# Patient Record
Sex: Male | Born: 1945
Health system: Southern US, Community
[De-identification: ages and names within clinical notes are randomized; demographics above are authoritative.]

## PROBLEM LIST (undated history)

## (undated) DIAGNOSIS — K760 Fatty (change of) liver, not elsewhere classified: Secondary | ICD-10-CM

## (undated) DIAGNOSIS — F419 Anxiety disorder, unspecified: Secondary | ICD-10-CM

## (undated) DIAGNOSIS — Z8673 Personal history of transient ischemic attack (TIA), and cerebral infarction without residual deficits: Secondary | ICD-10-CM

## (undated) DIAGNOSIS — I351 Nonrheumatic aortic (valve) insufficiency: Secondary | ICD-10-CM

## (undated) DIAGNOSIS — B3781 Candidal esophagitis: Secondary | ICD-10-CM

## (undated) DIAGNOSIS — K641 Second degree hemorrhoids: Secondary | ICD-10-CM

## (undated) DIAGNOSIS — Z860101 Personal history of adenomatous and serrated colon polyps: Secondary | ICD-10-CM

## (undated) DIAGNOSIS — G2581 Restless legs syndrome: Secondary | ICD-10-CM

## (undated) DIAGNOSIS — G4730 Sleep apnea, unspecified: Secondary | ICD-10-CM

## (undated) DIAGNOSIS — Z8719 Personal history of other diseases of the digestive system: Secondary | ICD-10-CM

## (undated) DIAGNOSIS — N529 Male erectile dysfunction, unspecified: Secondary | ICD-10-CM

## (undated) DIAGNOSIS — Z8551 Personal history of malignant neoplasm of bladder: Secondary | ICD-10-CM

## (undated) DIAGNOSIS — Z86010 Personal history of colonic polyps: Secondary | ICD-10-CM

## (undated) DIAGNOSIS — Z973 Presence of spectacles and contact lenses: Secondary | ICD-10-CM

## (undated) DIAGNOSIS — F32A Depression, unspecified: Secondary | ICD-10-CM

## (undated) DIAGNOSIS — Z8546 Personal history of malignant neoplasm of prostate: Secondary | ICD-10-CM

## (undated) DIAGNOSIS — R760 Raised antibody titer: Secondary | ICD-10-CM

## (undated) DIAGNOSIS — K449 Diaphragmatic hernia without obstruction or gangrene: Secondary | ICD-10-CM

## (undated) DIAGNOSIS — Z9989 Dependence on other enabling machines and devices: Secondary | ICD-10-CM

## (undated) DIAGNOSIS — I493 Ventricular premature depolarization: Secondary | ICD-10-CM

## (undated) DIAGNOSIS — K601 Chronic anal fissure: Secondary | ICD-10-CM

## (undated) DIAGNOSIS — R011 Cardiac murmur, unspecified: Secondary | ICD-10-CM

## (undated) DIAGNOSIS — Z86718 Personal history of other venous thrombosis and embolism: Secondary | ICD-10-CM

## (undated) DIAGNOSIS — D509 Iron deficiency anemia, unspecified: Secondary | ICD-10-CM

## (undated) DIAGNOSIS — C679 Malignant neoplasm of bladder, unspecified: Secondary | ICD-10-CM

## (undated) DIAGNOSIS — Z86711 Personal history of pulmonary embolism: Secondary | ICD-10-CM

## (undated) DIAGNOSIS — M1990 Unspecified osteoarthritis, unspecified site: Secondary | ICD-10-CM

## (undated) DIAGNOSIS — I10 Essential (primary) hypertension: Secondary | ICD-10-CM

## (undated) DIAGNOSIS — C801 Malignant (primary) neoplasm, unspecified: Secondary | ICD-10-CM

## (undated) DIAGNOSIS — K219 Gastro-esophageal reflux disease without esophagitis: Secondary | ICD-10-CM

## (undated) DIAGNOSIS — G4733 Obstructive sleep apnea (adult) (pediatric): Secondary | ICD-10-CM

## (undated) DIAGNOSIS — F329 Major depressive disorder, single episode, unspecified: Secondary | ICD-10-CM

## (undated) DIAGNOSIS — Z8739 Personal history of other diseases of the musculoskeletal system and connective tissue: Secondary | ICD-10-CM

## (undated) DIAGNOSIS — E785 Hyperlipidemia, unspecified: Secondary | ICD-10-CM

## (undated) DIAGNOSIS — D6861 Antiphospholipid syndrome: Secondary | ICD-10-CM

## (undated) HISTORY — PX: STRABISMUS SURGERY: SHX218

## (undated) HISTORY — DX: Antiphospholipid syndrome: D68.61

## (undated) HISTORY — DX: Iron deficiency anemia, unspecified: D50.9

## (undated) HISTORY — DX: Fatty (change of) liver, not elsewhere classified: K76.0

## (undated) HISTORY — PX: COLONOSCOPY: SHX174

## (undated) HISTORY — PX: CARDIOVASCULAR STRESS TEST: SHX262

## (undated) HISTORY — DX: Sleep apnea, unspecified: G47.30

## (undated) HISTORY — PX: TONSILLECTOMY: SHX5217

## (undated) HISTORY — PX: UPPER GASTROINTESTINAL ENDOSCOPY: SHX188

## (undated) HISTORY — DX: Restless legs syndrome: G25.81

## (undated) HISTORY — DX: Male erectile dysfunction, unspecified: N52.9

## (undated) HISTORY — PX: APPENDECTOMY: SHX54

## (undated) HISTORY — DX: Diaphragmatic hernia without obstruction or gangrene: K44.9

## (undated) HISTORY — PX: TRANSTHORACIC ECHOCARDIOGRAM: SHX275

## (undated) HISTORY — DX: Major depressive disorder, single episode, unspecified: F32.9

## (undated) HISTORY — DX: Essential (primary) hypertension: I10

## (undated) HISTORY — DX: Personal history of other diseases of the musculoskeletal system and connective tissue: Z87.39

## (undated) HISTORY — DX: Raised antibody titer: R76.0

## (undated) HISTORY — DX: Depression, unspecified: F32.A

## (undated) HISTORY — DX: Hyperlipidemia, unspecified: E78.5

## (undated) HISTORY — DX: Anxiety disorder, unspecified: F41.9

## (undated) HISTORY — DX: Chronic anal fissure: K60.1

## (undated) HISTORY — DX: Gastro-esophageal reflux disease without esophagitis: K21.9

## (undated) HISTORY — PX: ESOPHAGOGASTRODUODENOSCOPY: SHX1529

---
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
## 2003-01-22 ENCOUNTER — Ambulatory Visit (HOSPITAL_COMMUNITY): Admission: RE | Admit: 2003-01-22 | Discharge: 2003-01-22 | Payer: Self-pay | Admitting: Gastroenterology

## 2003-01-22 ENCOUNTER — Encounter: Payer: Self-pay | Admitting: Gastroenterology

## 2003-05-06 ENCOUNTER — Encounter: Payer: Self-pay | Admitting: Emergency Medicine

## 2003-05-06 ENCOUNTER — Emergency Department (HOSPITAL_COMMUNITY): Admission: EM | Admit: 2003-05-06 | Discharge: 2003-05-06 | Payer: Self-pay | Admitting: Emergency Medicine

## 2004-04-09 ENCOUNTER — Encounter: Admission: RE | Admit: 2004-04-09 | Discharge: 2004-07-08 | Payer: Self-pay | Admitting: Pediatrics

## 2004-07-09 ENCOUNTER — Encounter: Admission: RE | Admit: 2004-07-09 | Discharge: 2004-10-07 | Payer: Self-pay | Admitting: Pediatrics

## 2004-07-23 ENCOUNTER — Ambulatory Visit: Payer: Self-pay | Admitting: Gastroenterology

## 2004-07-29 ENCOUNTER — Ambulatory Visit: Payer: Self-pay | Admitting: Gastroenterology

## 2004-09-10 ENCOUNTER — Ambulatory Visit: Payer: Self-pay | Admitting: Gastroenterology

## 2004-09-15 ENCOUNTER — Ambulatory Visit: Payer: Self-pay | Admitting: Gastroenterology

## 2004-10-08 ENCOUNTER — Encounter: Admission: RE | Admit: 2004-10-08 | Discharge: 2004-10-11 | Payer: Self-pay | Admitting: Pediatrics

## 2004-10-14 ENCOUNTER — Ambulatory Visit: Payer: Self-pay | Admitting: Gastroenterology

## 2004-10-18 ENCOUNTER — Ambulatory Visit (HOSPITAL_COMMUNITY): Admission: RE | Admit: 2004-10-18 | Discharge: 2004-10-18 | Payer: Self-pay | Admitting: Gastroenterology

## 2005-11-04 ENCOUNTER — Ambulatory Visit: Payer: Self-pay | Admitting: Gastroenterology

## 2005-12-08 ENCOUNTER — Ambulatory Visit: Payer: Self-pay | Admitting: Gastroenterology

## 2006-04-12 ENCOUNTER — Encounter: Admission: RE | Admit: 2006-04-12 | Discharge: 2006-07-11 | Payer: Self-pay | Admitting: Pediatrics

## 2006-07-12 ENCOUNTER — Encounter: Admission: RE | Admit: 2006-07-12 | Discharge: 2006-10-10 | Payer: Self-pay | Admitting: Pediatrics

## 2006-12-14 ENCOUNTER — Ambulatory Visit: Payer: Self-pay | Admitting: Gastroenterology

## 2006-12-14 LAB — CONVERTED CEMR LAB
AST: 36 units/L (ref 0–37)
Amylase: 47 units/L (ref 27–131)
Bilirubin, Direct: 0.2 mg/dL (ref 0.0–0.3)
Lipase: 51 units/L (ref 11.0–59.0)
Vitamin B-12: 860 pg/mL (ref 211–911)

## 2006-12-19 ENCOUNTER — Ambulatory Visit: Payer: Self-pay | Admitting: Gastroenterology

## 2007-01-16 ENCOUNTER — Ambulatory Visit: Payer: Self-pay | Admitting: Gastroenterology

## 2007-01-18 ENCOUNTER — Ambulatory Visit (HOSPITAL_COMMUNITY): Admission: RE | Admit: 2007-01-18 | Discharge: 2007-01-18 | Payer: Self-pay | Admitting: Gastroenterology

## 2007-01-31 ENCOUNTER — Encounter: Payer: Self-pay | Admitting: Gastroenterology

## 2007-01-31 ENCOUNTER — Ambulatory Visit: Payer: Self-pay | Admitting: Gastroenterology

## 2007-05-25 ENCOUNTER — Ambulatory Visit (HOSPITAL_COMMUNITY): Admission: RE | Admit: 2007-05-25 | Discharge: 2007-05-26 | Payer: Self-pay | Admitting: General Surgery

## 2007-05-25 ENCOUNTER — Encounter (INDEPENDENT_AMBULATORY_CARE_PROVIDER_SITE_OTHER): Payer: Self-pay | Admitting: General Surgery

## 2007-05-25 HISTORY — PX: LAPAROSCOPIC CHOLECYSTECTOMY: SUR755

## 2007-08-08 ENCOUNTER — Ambulatory Visit: Payer: Self-pay | Admitting: Vascular Surgery

## 2007-12-21 ENCOUNTER — Ambulatory Visit (HOSPITAL_BASED_OUTPATIENT_CLINIC_OR_DEPARTMENT_OTHER): Admission: RE | Admit: 2007-12-21 | Discharge: 2007-12-21 | Payer: Self-pay | Admitting: Otolaryngology

## 2008-01-05 DIAGNOSIS — K4090 Unilateral inguinal hernia, without obstruction or gangrene, not specified as recurrent: Secondary | ICD-10-CM | POA: Insufficient documentation

## 2008-01-05 DIAGNOSIS — E785 Hyperlipidemia, unspecified: Secondary | ICD-10-CM

## 2008-01-05 DIAGNOSIS — R945 Abnormal results of liver function studies: Secondary | ICD-10-CM

## 2008-01-05 DIAGNOSIS — R011 Cardiac murmur, unspecified: Secondary | ICD-10-CM

## 2008-01-05 DIAGNOSIS — G4730 Sleep apnea, unspecified: Secondary | ICD-10-CM | POA: Insufficient documentation

## 2008-01-05 DIAGNOSIS — Z87898 Personal history of other specified conditions: Secondary | ICD-10-CM

## 2008-01-05 DIAGNOSIS — I10 Essential (primary) hypertension: Secondary | ICD-10-CM | POA: Insufficient documentation

## 2008-01-05 DIAGNOSIS — K8020 Calculus of gallbladder without cholecystitis without obstruction: Secondary | ICD-10-CM | POA: Insufficient documentation

## 2008-01-05 DIAGNOSIS — I119 Hypertensive heart disease without heart failure: Secondary | ICD-10-CM

## 2008-01-05 DIAGNOSIS — G4733 Obstructive sleep apnea (adult) (pediatric): Secondary | ICD-10-CM | POA: Insufficient documentation

## 2008-01-05 DIAGNOSIS — F341 Dysthymic disorder: Secondary | ICD-10-CM

## 2008-01-08 ENCOUNTER — Ambulatory Visit: Payer: Self-pay | Admitting: Internal Medicine

## 2008-01-29 ENCOUNTER — Encounter: Payer: Self-pay | Admitting: Internal Medicine

## 2008-05-30 ENCOUNTER — Telehealth: Payer: Self-pay | Admitting: Gastroenterology

## 2008-06-06 ENCOUNTER — Ambulatory Visit: Payer: Self-pay | Admitting: Gastroenterology

## 2008-07-30 ENCOUNTER — Encounter (INDEPENDENT_AMBULATORY_CARE_PROVIDER_SITE_OTHER): Payer: Self-pay | Admitting: Urology

## 2008-07-30 ENCOUNTER — Ambulatory Visit (HOSPITAL_COMMUNITY): Admission: RE | Admit: 2008-07-30 | Discharge: 2008-07-30 | Payer: Self-pay | Admitting: Urology

## 2008-07-30 HISTORY — PX: OTHER SURGICAL HISTORY: SHX169

## 2008-07-31 ENCOUNTER — Emergency Department (HOSPITAL_COMMUNITY): Admission: EM | Admit: 2008-07-31 | Discharge: 2008-07-31 | Payer: Self-pay | Admitting: Emergency Medicine

## 2008-08-12 ENCOUNTER — Ambulatory Visit (HOSPITAL_COMMUNITY): Admission: RE | Admit: 2008-08-12 | Discharge: 2008-08-12 | Payer: Self-pay | Admitting: Urology

## 2008-08-26 ENCOUNTER — Ambulatory Visit: Payer: Self-pay | Admitting: Oncology

## 2008-09-15 LAB — CBC & DIFF AND RETIC
BASO%: 0.3 % (ref 0.0–2.0)
Eosinophils Absolute: 0.1 10*3/uL (ref 0.0–0.5)
HCT: 42.8 % (ref 38.7–49.9)
IRF: 0.37 (ref 0.070–0.380)
MCHC: 34.5 g/dL (ref 32.0–35.9)
MONO#: 0.4 10*3/uL (ref 0.1–0.9)
NEUT#: 4.5 10*3/uL (ref 1.5–6.5)
Platelets: 200 10*3/uL (ref 145–400)
RBC: 4.86 10*6/uL (ref 4.20–5.71)
Retic %: 1.6 % (ref 0.7–2.3)
WBC: 6.6 10*3/uL (ref 4.0–10.0)
lymph#: 1.6 10*3/uL (ref 0.9–3.3)

## 2008-09-15 LAB — COMPREHENSIVE METABOLIC PANEL
ALT: 79 U/L — ABNORMAL HIGH (ref 0–53)
AST: 49 U/L — ABNORMAL HIGH (ref 0–37)
Albumin: 4 g/dL (ref 3.5–5.2)
Alkaline Phosphatase: 75 U/L (ref 39–117)
Potassium: 4.2 mEq/L (ref 3.5–5.3)
Sodium: 141 mEq/L (ref 135–145)
Total Bilirubin: 0.9 mg/dL (ref 0.3–1.2)
Total Protein: 7.1 g/dL (ref 6.0–8.3)

## 2008-09-15 LAB — MORPHOLOGY

## 2008-09-15 LAB — CHCC SMEAR

## 2008-09-16 LAB — FERRITIN: Ferritin: 106 ng/mL (ref 22–322)

## 2008-09-16 LAB — IRON AND TIBC: %SAT: 26 % (ref 20–55)

## 2008-10-20 ENCOUNTER — Inpatient Hospital Stay (HOSPITAL_COMMUNITY): Admission: RE | Admit: 2008-10-20 | Discharge: 2008-10-22 | Payer: Self-pay | Admitting: Urology

## 2008-10-20 ENCOUNTER — Encounter (INDEPENDENT_AMBULATORY_CARE_PROVIDER_SITE_OTHER): Payer: Self-pay | Admitting: Urology

## 2008-10-20 HISTORY — PX: ROBOT ASSISTED LAPAROSCOPIC RADICAL PROSTATECTOMY: SHX5141

## 2008-10-24 ENCOUNTER — Emergency Department (HOSPITAL_BASED_OUTPATIENT_CLINIC_OR_DEPARTMENT_OTHER): Admission: EM | Admit: 2008-10-24 | Discharge: 2008-10-25 | Payer: Self-pay | Admitting: Emergency Medicine

## 2008-10-25 ENCOUNTER — Inpatient Hospital Stay (HOSPITAL_COMMUNITY): Admission: EM | Admit: 2008-10-25 | Discharge: 2008-10-27 | Payer: Self-pay | Admitting: Internal Medicine

## 2008-10-25 ENCOUNTER — Ambulatory Visit: Payer: Self-pay | Admitting: Diagnostic Radiology

## 2008-10-27 ENCOUNTER — Encounter (INDEPENDENT_AMBULATORY_CARE_PROVIDER_SITE_OTHER): Payer: Self-pay | Admitting: Internal Medicine

## 2008-10-27 ENCOUNTER — Ambulatory Visit: Payer: Self-pay | Admitting: Vascular Surgery

## 2008-10-30 ENCOUNTER — Observation Stay (HOSPITAL_COMMUNITY): Admission: EM | Admit: 2008-10-30 | Discharge: 2008-10-31 | Payer: Self-pay | Admitting: Emergency Medicine

## 2008-11-19 ENCOUNTER — Encounter: Payer: Self-pay | Admitting: Gastroenterology

## 2008-12-12 ENCOUNTER — Ambulatory Visit: Payer: Self-pay | Admitting: Gastroenterology

## 2008-12-12 DIAGNOSIS — C679 Malignant neoplasm of bladder, unspecified: Secondary | ICD-10-CM

## 2008-12-12 DIAGNOSIS — Z8546 Personal history of malignant neoplasm of prostate: Secondary | ICD-10-CM

## 2008-12-12 DIAGNOSIS — Z86718 Personal history of other venous thrombosis and embolism: Secondary | ICD-10-CM

## 2008-12-12 LAB — CONVERTED CEMR LAB
ALT: 138 units/L — ABNORMAL HIGH (ref 0–53)
AST: 73 units/L — ABNORMAL HIGH (ref 0–37)
Alkaline Phosphatase: 77 units/L (ref 39–117)
Eosinophils Absolute: 0.1 10*3/uL (ref 0.0–0.7)
Folate: 17.5 ng/mL
Hemoglobin: 14.5 g/dL (ref 13.0–17.0)
Lymphocytes Relative: 27.4 % (ref 12.0–46.0)
MCHC: 34.5 g/dL (ref 30.0–36.0)
MCV: 85.3 fL (ref 78.0–100.0)
Monocytes Absolute: 0.3 10*3/uL (ref 0.1–1.0)
Neutrophils Relative %: 62.8 % (ref 43.0–77.0)
RBC: 4.92 M/uL (ref 4.22–5.81)
RDW: 13.7 % (ref 11.5–14.6)
Total Protein: 7.2 g/dL (ref 6.0–8.3)
WBC: 4.1 10*3/uL — ABNORMAL LOW (ref 4.5–10.5)

## 2008-12-31 ENCOUNTER — Telehealth: Payer: Self-pay | Admitting: Gastroenterology

## 2009-01-05 ENCOUNTER — Ambulatory Visit: Payer: Self-pay | Admitting: Gastroenterology

## 2009-01-05 ENCOUNTER — Encounter: Payer: Self-pay | Admitting: Gastroenterology

## 2009-01-07 ENCOUNTER — Encounter: Payer: Self-pay | Admitting: Gastroenterology

## 2009-03-13 ENCOUNTER — Ambulatory Visit: Payer: Self-pay | Admitting: Oncology

## 2009-03-17 LAB — PSA: PSA: <0.01 ng/mL — ABNORMAL LOW (ref 0.10–4.00)

## 2009-04-13 ENCOUNTER — Encounter: Admission: RE | Admit: 2009-04-13 | Discharge: 2009-04-13 | Payer: Self-pay | Admitting: Internal Medicine

## 2009-08-15 ENCOUNTER — Emergency Department (HOSPITAL_COMMUNITY): Admission: EM | Admit: 2009-08-15 | Discharge: 2009-08-15 | Payer: Self-pay | Admitting: Emergency Medicine

## 2009-10-07 ENCOUNTER — Ambulatory Visit: Payer: Self-pay | Admitting: Oncology

## 2009-10-07 LAB — CBC WITH DIFFERENTIAL/PLATELET
BASO%: 0.5 % (ref 0.0–2.0)
Basophils Absolute: 0 10*3/uL (ref 0.0–0.1)
EOS%: 2.3 % (ref 0.0–7.0)
HCT: 45.3 % (ref 38.4–49.9)
HGB: 15.6 g/dL (ref 13.0–17.1)
LYMPH%: 30.6 % (ref 14.0–49.0)
MCH: 30.8 pg (ref 27.2–33.4)
MCHC: 34.4 g/dL (ref 32.0–36.0)
MONO%: 6 % (ref 0.0–14.0)
NEUT#: 4.2 10*3/uL (ref 1.5–6.5)
NEUT%: 60.6 % (ref 39.0–75.0)
RBC: 5.06 10*6/uL (ref 4.20–5.82)
RDW: 14 % (ref 11.0–14.6)
lymph#: 2.1 10*3/uL (ref 0.9–3.3)

## 2009-10-13 LAB — PROTEIN C, TOTAL: Protein C, Total: 122 % (ref 70–140)

## 2009-10-13 LAB — FIBRINOGEN: Fibrinogen: 220 mg/dL (ref 204–475)

## 2009-10-13 LAB — PROTEIN S, TOTAL: Protein S Ag, Total: 126 % (ref 70–140)

## 2009-11-02 ENCOUNTER — Ambulatory Visit: Payer: Self-pay | Admitting: Gastroenterology

## 2009-12-17 ENCOUNTER — Ambulatory Visit (HOSPITAL_COMMUNITY): Admission: RE | Admit: 2009-12-17 | Discharge: 2009-12-17 | Payer: Self-pay | Admitting: Cardiovascular Disease

## 2010-01-13 ENCOUNTER — Ambulatory Visit: Payer: Self-pay | Admitting: Oncology

## 2010-01-25 ENCOUNTER — Encounter: Admission: RE | Admit: 2010-01-25 | Discharge: 2010-01-25 | Payer: Self-pay | Admitting: Otolaryngology

## 2010-03-12 ENCOUNTER — Ambulatory Visit: Payer: Self-pay | Admitting: Oncology

## 2010-03-16 LAB — CBC WITH DIFFERENTIAL/PLATELET
EOS%: 1.9 % (ref 0.0–7.0)
Eosinophils Absolute: 0.1 10*3/uL (ref 0.0–0.5)
LYMPH%: 27.7 % (ref 14.0–49.0)
MCHC: 34.2 g/dL (ref 32.0–36.0)
MCV: 89.1 fL (ref 79.3–98.0)
MONO#: 0.4 10*3/uL (ref 0.1–0.9)
NEUT#: 3.5 10*3/uL (ref 1.5–6.5)
RBC: 4.93 10*6/uL (ref 4.20–5.82)
RDW: 13.9 % (ref 11.0–14.6)
WBC: 5.4 10*3/uL (ref 4.0–10.3)
lymph#: 1.5 10*3/uL (ref 0.9–3.3)

## 2010-03-16 LAB — COMPREHENSIVE METABOLIC PANEL
AST: 22 U/L (ref 0–37)
Alkaline Phosphatase: 58 U/L (ref 39–117)
CO2: 28 mEq/L (ref 19–32)
Glucose, Bld: 89 mg/dL (ref 70–99)
Sodium: 142 mEq/L (ref 135–145)

## 2010-03-16 LAB — PSA: PSA: <0.01 ng/mL — ABNORMAL LOW (ref 0.10–4.00)

## 2010-04-01 ENCOUNTER — Ambulatory Visit: Payer: Self-pay | Admitting: Cardiovascular Disease

## 2010-05-11 ENCOUNTER — Telehealth: Payer: Self-pay | Admitting: Gastroenterology

## 2010-05-12 IMAGING — CT CT ANGIO CHEST
2 of 6 series · 19 of 36 positions shown · IV contrast (APPLIED)
Comparison: Chest radiography 07/31/2008

CLINICAL DATA: Leg swelling after surgery.  Elevated D-dimer.

CT ANGIOGRAPHY CHEST
TECHNIQUE: Multidetector CT imaging of the chest was performed
using the standard protocol during bolus administration of
intravenous contrast. Multiplanar CT image reconstructions
including MIPs were obtained to evaluate the vascular anatomy.
Contrast: 80 ml Omnipaque 315

[Series 5: pe 1.0 b25f · axial · 0.81mm/px · z∈[-289,-24]mm · 18 of 295 slices shown]
[im 15/295  lung]
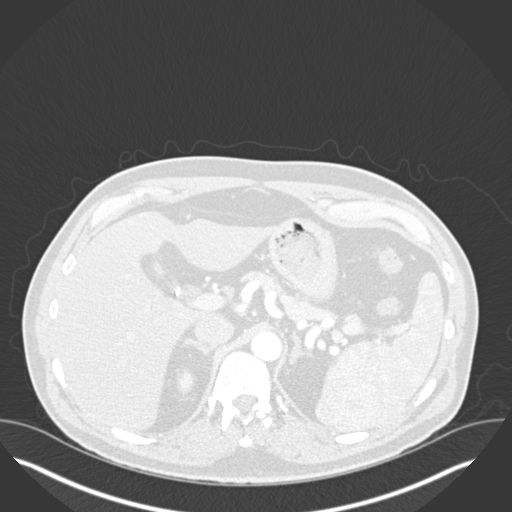
[im 30/295  mediastinal]
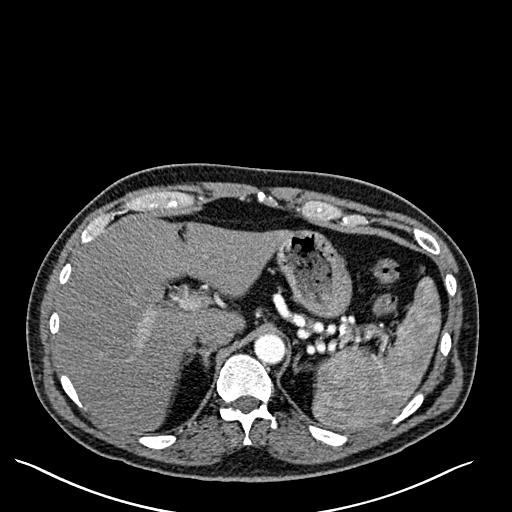
[im 45/295  lung]
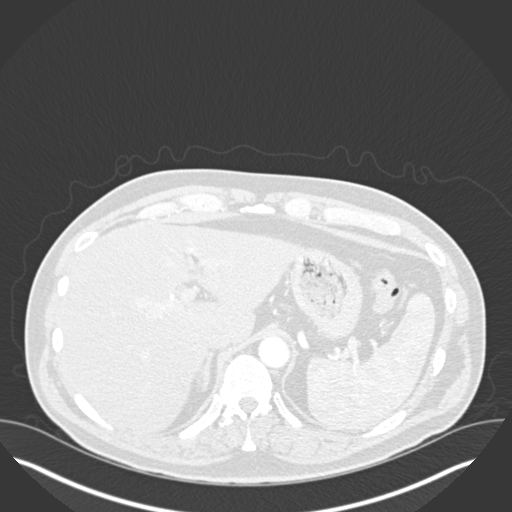
[im 59/295  mediastinal]
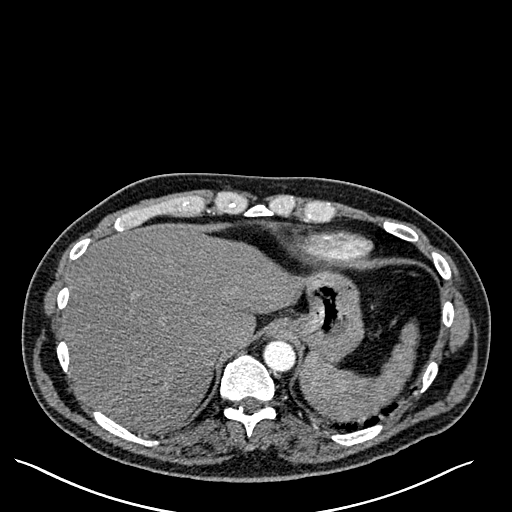
[im 74/295  lung]
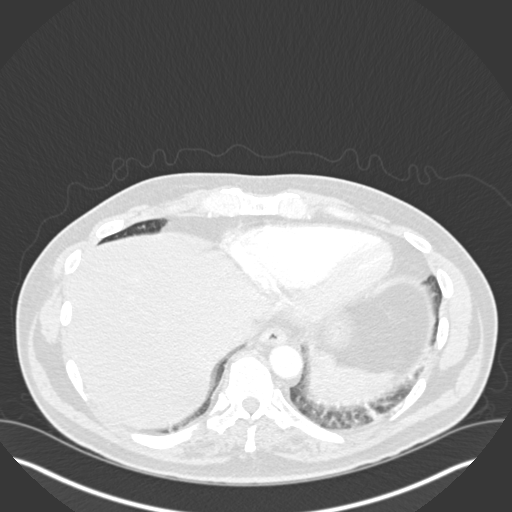
[im 89/295  mediastinal]
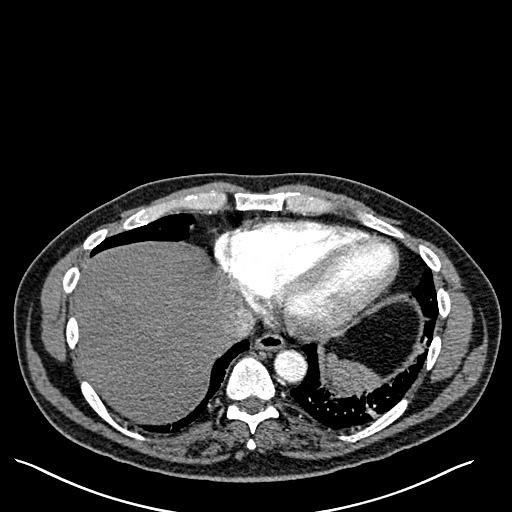
[im 103/295  lung]
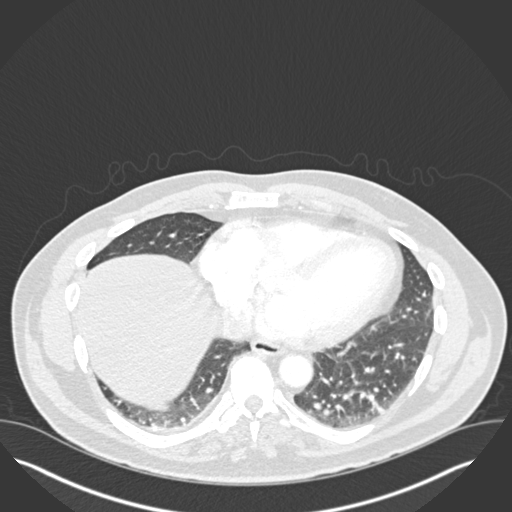
[im 118/295  mediastinal]
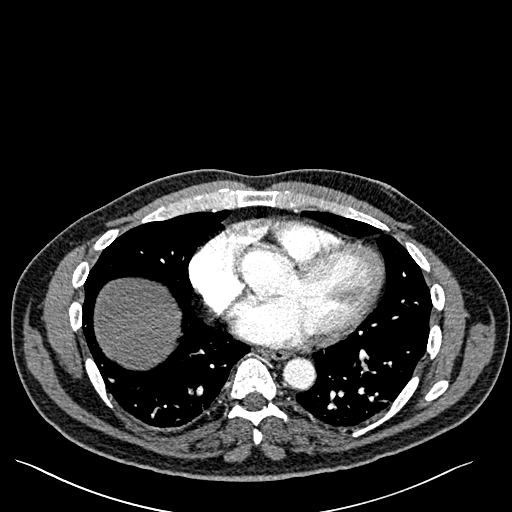
[im 133/295  lung]
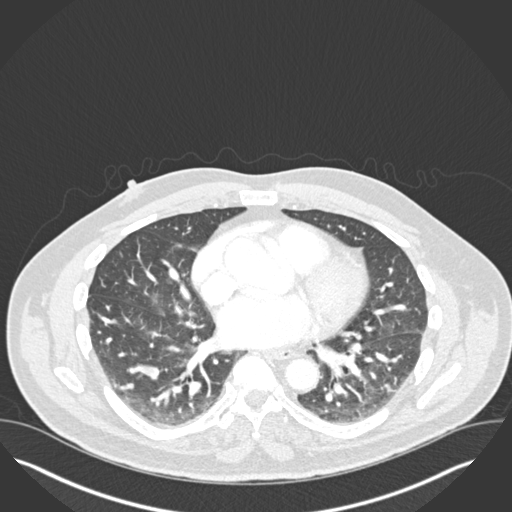
[im 162/295  mediastinal]
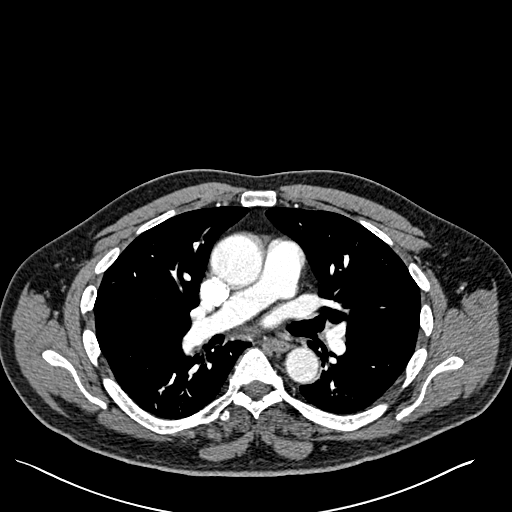
[im 177/295  lung]
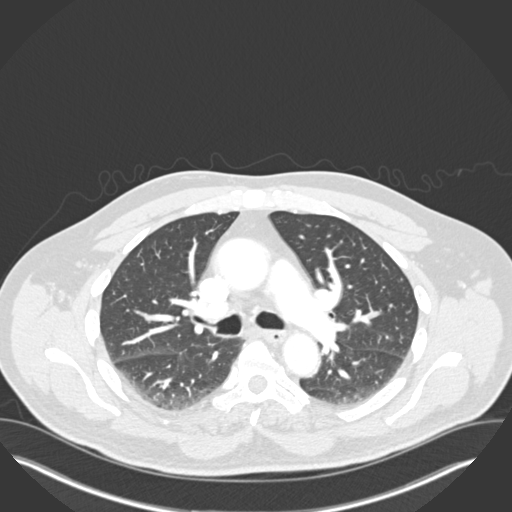
[im 192/295  mediastinal]
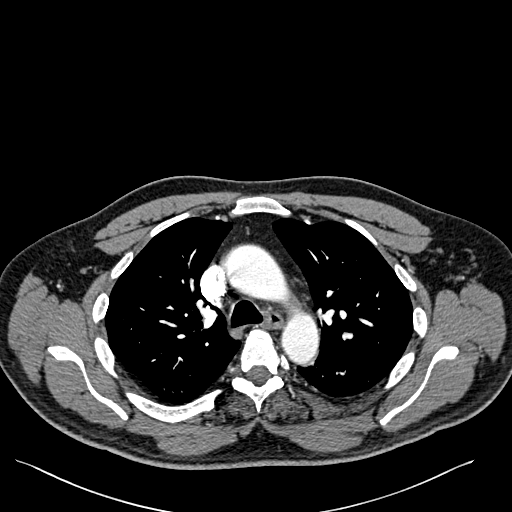
[im 206/295  lung]
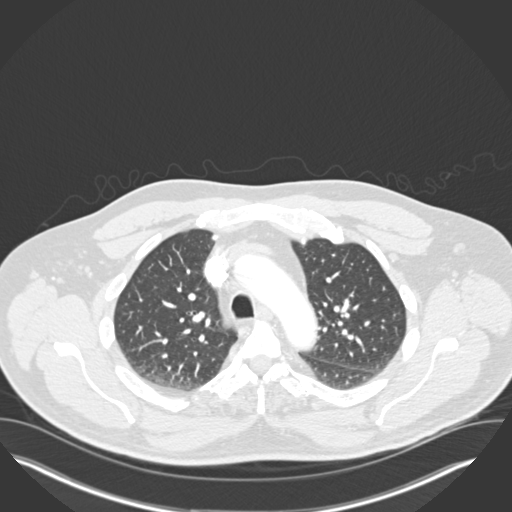
[im 221/295  mediastinal]
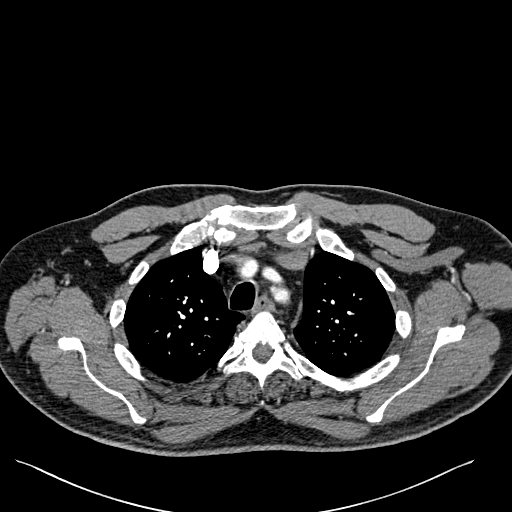
[im 236/295  lung]
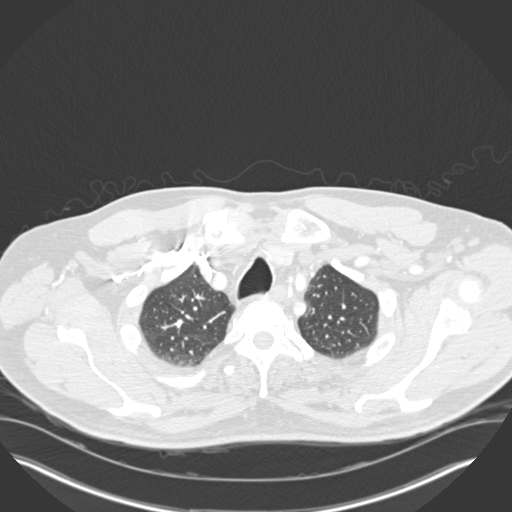
[im 250/295  mediastinal]
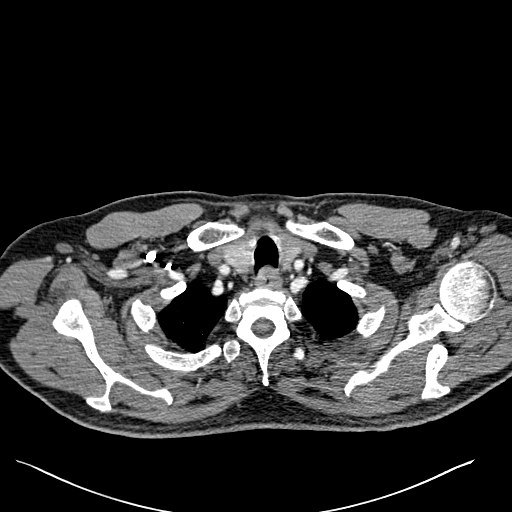
[im 265/295  lung]
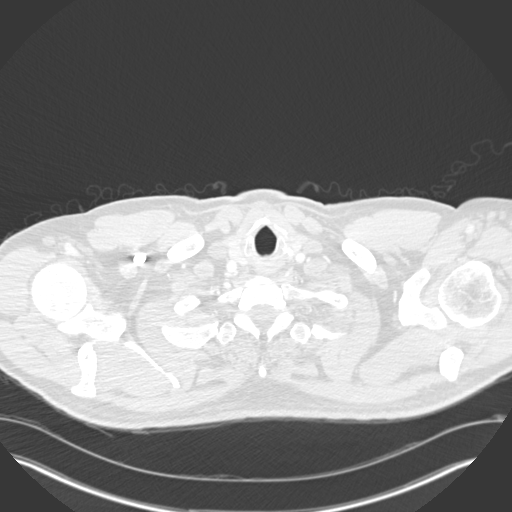
[im 280/295  mediastinal]
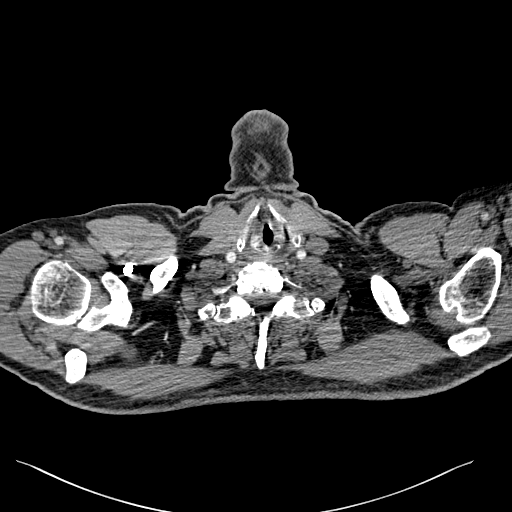

[Series 8: pe 2.0 coronal · coronal · 0.61mm/px · 1 of 110 slices shown]
[im 55/110  mediastinal]
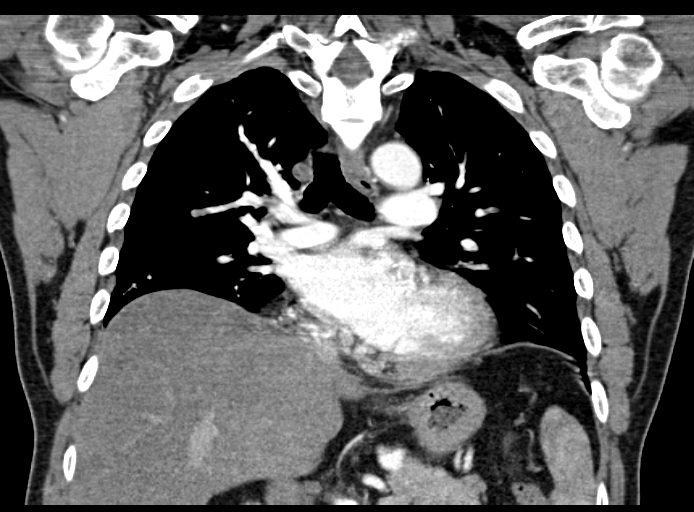

[19 of 36 positions shown; findings below may reference images not displayed]

FINDINGS: There is mild atelectasis at the lung bases.  There are
small emboli and right lower lobe pulmonary arterial branches.
There are also a few small emboli at the left base.  No large
emboli are evident.  There is no evidence of pulmonary mass.  No
hilar or mediastinal adenopathy.  No pleural or pericardial fluid.

Scans in the upper abdomen show no acute finding.  There is some
fatty change of the liver.

 Review of the MIP images confirms the above findings.
IMPRESSION: Small emboli within the pulmonary arterial branches at both bases.
I discussed the case with Dr. Sania at [DATE] hours.

## 2010-09-05 ENCOUNTER — Encounter: Payer: Self-pay | Admitting: Gastroenterology

## 2010-09-16 NOTE — Assessment & Plan Note (Signed)
Summary: rectal bleeding...em   History of Present Illness Visit Type: Follow-up Visit Primary GI MD: Sheryn Bison MD FACP FAGA Primary Provider: Merri Brunette, MD Requesting Provider: n/a Chief Complaint: BRB in stool after BMs and when pt wipes History of Present Illness:   Asymptomatic recurrent rectal bleeding associated with mild constipation and multiple colonoscopies. This patient has had surgery in the last year for prostate cancer and bladder cancer and is concerned over his hemorrhoidal bleeding which has been a problem for several years with colonoscopy last performed in May of 2010. He follows a regular diet and takes daily Citrucel. He has been using p.r.n. local anal steroid creams. He denies any symptoms of anemia or upper gastrointestinal problems and is on multiple medications including daily omeprazole. He also has a long history of anxiety and depression.   GI Review of Systems      Denies abdominal pain, acid reflux, belching, bloating, chest pain, dysphagia with liquids, dysphagia with solids, heartburn, loss of appetite, nausea, vomiting, vomiting blood, weight loss, and  weight gain.      Reports rectal bleeding.     Denies anal fissure, black tarry stools, change in bowel habit, constipation, diarrhea, diverticulosis, fecal incontinence, heme positive stool, hemorrhoids, irritable bowel syndrome, jaundice, light color stool, liver problems, and  rectal pain.    Current Medications (verified): 1)  Omeprazole 20 Mg Cpdr (Omeprazole) .... Take 1 Tablet By Mouth Once A Day. 2)  Metoprolol Tartrate 50 Mg Tabs (Metoprolol Tartrate) .... One Tablet By Mouth Three Times A Day 3)  Altace 10 Mg  Caps (Ramipril) .... One By Mouth Once Daily 4)  Citrucel   Powd (Methylcellulose (Laxative)) .... Mix in 8 Oz and Drink Two Times A Day 5)  Lactose Fast Acting Relief 9000 Unit  Tabs (Lactase) .... Take One By Mouth Three Times A Day-Four Times A Day 6)  Flax Seed Oil 1000 Mg  Caps  (Flaxseed (Linseed)) .... One By Mouth Once Daily 7)  Fish Oil 1000 Mg  Caps (Omega-3 Fatty Acids) .... One By Mouth Once Daily 8)  Multivitamins   Tabs (Multiple Vitamin) .... One By Mouth Once Daily 9)  Grape Seed Extract 50 Mg  Caps (Grape Seed) .... One By Mouth Once Daily 10)  Beano   Tabs (Alpha-D-Galactosidase) .... Take Two By Mouth Two Times A Day As Needed 11)  Xanax 0.5 Mg  Tabs (Alprazolam) .... One By Mouth As Needed 12)  Zoloft 100 Mg Tabs (Sertraline Hcl) .... One Tablet By Mouth Once Daily 13)  Propranolol Hcl 10 Mg Tabs (Propranolol Hcl) .... 2 Tablets By Mouth Once Daily 14)  Hydrochlorothiazide 25 Mg Tabs (Hydrochlorothiazide) .... Take 1 Tablet By Mouth Two Times Per Week 15)  Anamantle Hc 3-0.5 % Crea (Lidocaine-Hydrocortisone Ace) .... Use Per Rectum 16)  Proctofoam Hc 1-1 % Foam (Hydrocortisone Ace-Pramoxine) .... Use As Needed Per Rectum 17)  Flonase 50 Mcg/act Susp (Fluticasone Propionate) .Marland Kitchen.. 1 Squirt Each Nostril Once Daily 18)  Afrin Saline Nasal Mist 0.65 % Soln (Saline) .... Use At Night 19)  Aspirin 81 Mg Tbec (Aspirin) .... One Tablet By Mouth Once Daily  Allergies (verified): 1)  ! Penicillin 2)  ! Asa 3)  ! * Mri Dye  Past History:  Past medical, surgical, family and social histories (including risk factors) reviewed for relevance to current acute and chronic problems.  Past Medical History: Reviewed history from 12/12/2008 and no changes required. Current Problems:  PULMONARY EMBOLISM, HX OF (ICD-V12.51) Hx of  BLADDER CANCER (ICD-188.9) PROSTATE CANCER, HX OF (ICD-V10.46) IRRITABLE BOWEL SYNDROME (ICD-564.1) RECTAL BLEEDING (ICD-569.3) INGUINAL HERNIA, HX OF (ICD-V13.8) HEMORRHOIDS, EXTERNAL (ICD-455.3) HIATAL HERNIA (ICD-553.3) DIVERTICULOSIS, COLON (ICD-562.10) GASTRITIS (ICD-535.50) HYPERTENSIVE CARDIOVASCULAR DISEASE (ICD-402.90) CHOLELITHIASIS, ASYMPTOMATIC (ICD-574.20) GERD (ICD-530.81) SYSTOLIC MURMUR (ZOX-096.2) LIVER FUNCTION  TESTS, ABNORMAL (ICD-794.8) HIATAL HERNIA, HX OF (ICD-V12.79) INGUINAL HERNIA (ICD-550.90) SLEEP APNEA (ICD-780.57) ANXIETY DEPRESSION (ICD-300.4) HYPERLIPIDEMIA (ICD-272.4) HYPERTENSION (ICD-401.9)    Past Surgical History: Reviewed history from 12/12/2008 and no changes required. Lt. inguinal hernia repair appendectomy prostatectomy bladder tumor resection cholecystectomy  Family History: Reviewed history from 12/12/2008 and no changes required. Family History of Diabetes: Grandmother, Great Aunt Family History of Colon Cancer: Rectal Cancer-Paternal Uncle Family History of Colitis/Crohn's: Twin Brother (colitis) Family History of Colon Polyps: Brother , Father Family History of Heart Disease: MGM Family History of Liver Disease/Cirrhosis: (cirrhosis) Great Uncle  Social History: Reviewed history from 12/12/2008 and no changes required. Illicit Drug Use - no Married & two children Education MSEE Alcohol Use - no Patient is a former smoker. -stopped age 47. pt still smokes occasional cigar Occupation: Art gallery manager Daily Caffeine Use-2 cups daily Patient gets regular exercise.  Review of Systems       The patient complains of fatigue.  The patient denies allergy/sinus, anemia, anxiety-new, arthritis/joint pain, back pain, blood in urine, breast changes/lumps, change in vision, confusion, cough, coughing up blood, depression-new, fainting, fever, headaches-new, hearing problems, heart murmur, heart rhythm changes, itching, muscle pains/cramps, night sweats, nosebleeds, shortness of breath, skin rash, sleeping problems, sore throat, swelling of feet/legs, swollen lymph glands, thirst - excessive, urination - excessive, urination changes/pain, urine leakage, vision changes, and voice change.    Vital Signs:  Patient profile:   65 year old male Height:      72 inches Weight:      222 pounds BMI:     30.22 BSA:     2.23 Pulse rate:   74 / minute Pulse rhythm:   regular BP  sitting:   128 / 82  (left arm) Cuff size:   regular  Vitals Entered By: Ok Anis CMA (November 02, 2009 8:55 AM)  Physical Exam  General:  Well developed, well nourished, no acute distress.healthy appearing.   Eyes:  PERRLA, no icterus. Abdomen:  Soft, nontender and nondistended. No masses, hepatosplenomegaly or hernias noted. Normal bowel sounds. Rectal:  Normal exam.Hemorrhoidal tissue visible in the anal canal without any obvious fissure or fistula. Rectal exam shows no masses or tenderness with soft stool present that is guaiac-negative. Neurologic:  Alert and  oriented x4;  grossly normal neurologically. Inguinal Nodes:  No significant inguinal adenopathy. Psych:  Alert and cooperative. Normal mood and affect.   Impression & Recommendations:  Problem # 1:  RECTAL BLEEDING (ICD-569.3) Assessment Improved Continue local anal care and creams and we'll change him to Benefiber daily with liberal p.o. fluids, Phillips: Health tabs twice a day and p.r.n. followup as needed. I see no need for followup colonoscopy this time.  Problem # 2:  DIVERTICULOSIS, COLON (ICD-562.10) Assessment: Comment Only Continue high-fiber diet as tolerated  Problem # 3:  CHOLELITHIASIS, ASYMPTOMATIC (ICD-574.20) Assessment: Unchanged No current hepatobiliary or upper GI issues.  Problem # 4:  ANXIETY DEPRESSION (ICD-300.4) Assessment: Improved continue multiple medications per primary care  Patient Instructions: 1)  Stop citracal and start benefiber daily. 2)  Take Circles Of Care two times a day. 3)  Hemorrhoids brochure given.  4)  The medication list was reviewed and reconciled.  All changed / newly prescribed  medications were explained.  A complete medication list was provided to the patient / caregiver. 5)  Diet should be high in fiber ( fruits, vegetables, whole grains) but low in residue. Drink at least eight (8) glasses of water a day.  6)  Please continue current medications.  7)   Copy sent to : Dr. Merri Brunette Prescriptions: ANAMANTLE HC 3-0.5 % CREA (LIDOCAINE-HYDROCORTISONE ACE) use per rectum as needed  #98 gm x 3   Entered by:   Ashok Cordia RN   Authorized by:   Mardella Layman MD [facility]   Signed by:   Ashok Cordia RN on 11/02/2009   Method used:   Electronically to        [phone] (retail)       [address]       [place], [place]  [address]       Ph: [phone]       Fax: [phone]   RxID:   [id number]

## 2010-09-16 NOTE — Progress Notes (Signed)
Summary: refill complications  Phone Note Call from Patient Call back at Work Phone [phone]   Caller: Patient Call For: Dr. Jarold Motto Reason for Call: Refill Medication Summary of Call: pt having complications with refilling Omeprazole... [facility] telling pt that he needs a rx renewal, but our office is not responding to their faxes... pt asked that he be called with an update Initial call taken by: Vallarie Mare,  May 11, 2010 2:28 PM  Follow-up for Phone Call        I have resonded to the pharm everytime they have sent it, and i have called twice and if there is a problem the pharm can call me. Follow-up by: Harlow Mares CMA Duncan Dull),  May 11, 2010 2:43 PM

## 2010-10-05 ENCOUNTER — Ambulatory Visit (INDEPENDENT_AMBULATORY_CARE_PROVIDER_SITE_OTHER): Payer: 59 | Admitting: Cardiovascular Disease

## 2010-10-05 DIAGNOSIS — I359 Nonrheumatic aortic valve disorder, unspecified: Secondary | ICD-10-CM

## 2010-10-05 DIAGNOSIS — I119 Hypertensive heart disease without heart failure: Secondary | ICD-10-CM

## 2010-10-31 LAB — COMPREHENSIVE METABOLIC PANEL
AST: 32 U/L (ref 0–37)
Albumin: 4.4 g/dL (ref 3.5–5.2)
BUN: 9 mg/dL (ref 6–23)
Calcium: 9.4 mg/dL (ref 8.4–10.5)
Creatinine, Ser: 0.98 mg/dL (ref 0.4–1.5)
GFR calc Af Amer: >60 mL/min (ref >60)
GFR calc non Af Amer: >60 mL/min (ref >60)
Total Bilirubin: 0.9 mg/dL (ref 0.3–1.2)

## 2010-10-31 LAB — DIFFERENTIAL
Basophils Absolute: 0 10*3/uL (ref 0.0–0.1)
Eosinophils Relative: 2 % (ref 0–5)
Lymphocytes Relative: 31 % (ref 12–46)
Lymphs Abs: 1.8 10*3/uL (ref 0.7–4.0)
Monocytes Absolute: 0.5 10*3/uL (ref 0.1–1.0)
Neutro Abs: 3.4 10*3/uL (ref 1.7–7.7)

## 2010-10-31 LAB — CBC
HCT: 44.1 % (ref 39.0–52.0)
MCHC: 33.4 g/dL (ref 30.0–36.0)
MCV: 90.1 fL (ref 78.0–100.0)
Platelets: 185 10*3/uL (ref 150–400)

## 2010-10-31 LAB — POCT CARDIAC MARKERS
CKMB, poc: 3.3 ng/mL (ref 1.0–8.0)
Myoglobin, poc: 131 ng/mL (ref 12–200)
Troponin i, poc: <0.05 ng/mL (ref 0.00–0.09)

## 2010-10-31 LAB — D-DIMER, QUANTITATIVE: D-Dimer, Quant: 0.31 ug/mL-FEU (ref 0.00–0.48)

## 2010-10-31 LAB — BRAIN NATRIURETIC PEPTIDE: Pro B Natriuretic peptide (BNP): <30 pg/mL (ref 0.0–100.0)

## 2010-11-13 ENCOUNTER — Other Ambulatory Visit: Payer: Self-pay | Admitting: Cardiovascular Disease

## 2010-11-13 DIAGNOSIS — I10 Essential (primary) hypertension: Secondary | ICD-10-CM

## 2010-11-15 ENCOUNTER — Other Ambulatory Visit: Payer: Self-pay | Admitting: *Deleted

## 2010-11-15 MED ORDER — METOPROLOL SUCCINATE ER 100 MG PO TB24: ORAL_TABLET | ORAL | Status: DC

## 2010-11-15 NOTE — Telephone Encounter (Signed)
REFILL DONE

## 2010-11-25 LAB — GLUCOSE, CAPILLARY: Glucose-Capillary: 135 mg/dL — ABNORMAL HIGH (ref 70–99)

## 2010-11-25 LAB — CBC
HCT: 35 % — ABNORMAL LOW (ref 39.0–52.0)
HCT: 34.2 % — ABNORMAL LOW (ref 39.0–52.0)
HCT: 37 % — ABNORMAL LOW (ref 39.0–52.0)
Hemoglobin: 11.7 g/dL — ABNORMAL LOW (ref 13.0–17.0)
Hemoglobin: 13.7 g/dL (ref 13.0–17.0)
Hemoglobin: 12.4 g/dL — ABNORMAL LOW (ref 13.0–17.0)
Hemoglobin: 12.7 g/dL — ABNORMAL LOW (ref 13.0–17.0)
MCHC: 33.4 g/dL (ref 30.0–36.0)
MCHC: 33.5 g/dL (ref 30.0–36.0)
MCHC: 33.8 g/dL (ref 30.0–36.0)
MCHC: 35 g/dL (ref 30.0–36.0)
MCHC: 34.6 g/dL (ref 30.0–36.0)
MCV: 89.2 fL (ref 78.0–100.0)
MCV: 88 fL (ref 78.0–100.0)
MCV: 88.5 fL (ref 78.0–100.0)
MCV: 89.2 fL (ref 78.0–100.0)
Platelets: 239 10*3/uL (ref 150–400)
Platelets: 252 10*3/uL (ref 150–400)
Platelets: 158 10*3/uL (ref 150–400)
Platelets: 180 10*3/uL (ref 150–400)
RBC: 3.93 MIL/uL — ABNORMAL LOW (ref 4.22–5.81)
RBC: 4.14 MIL/uL — ABNORMAL LOW (ref 4.22–5.81)
RBC: 4.15 MIL/uL — ABNORMAL LOW (ref 4.22–5.81)
RBC: 4.74 MIL/uL (ref 4.22–5.81)
RDW: 13.6 % (ref 11.5–15.5)
RDW: 14.3 % (ref 11.5–15.5)
RDW: 13.1 % (ref 11.5–15.5)
RDW: 14.3 % (ref 11.5–15.5)
RDW: 13.1 % (ref 11.5–15.5)
WBC: 10.8 10*3/uL — ABNORMAL HIGH (ref 4.0–10.5)
WBC: 11.5 10*3/uL — ABNORMAL HIGH (ref 4.0–10.5)
WBC: 5.7 10*3/uL (ref 4.0–10.5)

## 2010-11-25 LAB — DIFFERENTIAL
Basophils Absolute: 0.1 10*3/uL (ref 0.0–0.1)
Basophils Relative: 1 % (ref 0–1)
Basophils Relative: 0 % (ref 0–1)
Eosinophils Absolute: 0 10*3/uL (ref 0.0–0.7)
Eosinophils Relative: 5 % (ref 0–5)
Lymphocytes Relative: 17 % (ref 12–46)
Lymphs Abs: 1.1 10*3/uL (ref 0.7–4.0)
Lymphs Abs: 0.9 10*3/uL (ref 0.7–4.0)
Lymphs Abs: 1.3 10*3/uL (ref 0.7–4.0)
Lymphs Abs: 1.3 10*3/uL (ref 0.7–4.0)
Monocytes Absolute: 0.6 10*3/uL (ref 0.1–1.0)
Monocytes Relative: 8 % (ref 3–12)
Monocytes Relative: 9 % (ref 3–12)
Neutro Abs: 10.3 10*3/uL — ABNORMAL HIGH (ref 1.7–7.7)
Neutro Abs: 5.5 10*3/uL (ref 1.7–7.7)
Neutro Abs: 3.7 10*3/uL (ref 1.7–7.7)
Neutrophils Relative %: 70 % (ref 43–77)
Neutrophils Relative %: 90 % — ABNORMAL HIGH (ref 43–77)
Neutrophils Relative %: 63 % (ref 43–77)

## 2010-11-25 LAB — HEMOGLOBIN AND HEMATOCRIT, BLOOD
HCT: 34.2 % — ABNORMAL LOW (ref 39.0–52.0)
HCT: 42.3 % (ref 39.0–52.0)
Hemoglobin: 14.3 g/dL (ref 13.0–17.0)

## 2010-11-25 LAB — URINE MICROSCOPIC-ADD ON

## 2010-11-25 LAB — BASIC METABOLIC PANEL
BUN: 6 mg/dL (ref 6–23)
BUN: 7 mg/dL (ref 6–23)
CO2: 29 mEq/L (ref 19–32)
CO2: 29 mEq/L (ref 19–32)
CO2: 31 mEq/L (ref 19–32)
Calcium: 8.2 mg/dL — ABNORMAL LOW (ref 8.4–10.5)
Calcium: 9.1 mg/dL (ref 8.4–10.5)
Chloride: 103 mEq/L (ref 96–112)
Chloride: 106 mEq/L (ref 96–112)
Creatinine, Ser: 0.94 mg/dL (ref 0.4–1.5)
Creatinine, Ser: 0.94 mg/dL (ref 0.4–1.5)
GFR calc Af Amer: >60 mL/min (ref >60)
GFR calc Af Amer: >60 mL/min (ref >60)
GFR calc non Af Amer: >60 mL/min (ref >60)
GFR calc non Af Amer: >60 mL/min (ref >60)
Glucose, Bld: 109 mg/dL — ABNORMAL HIGH (ref 70–99)
Potassium: 4 mEq/L (ref 3.5–5.1)
Potassium: 4.2 mEq/L (ref 3.5–5.1)
Potassium: 4.3 mEq/L (ref 3.5–5.1)
Sodium: 137 mEq/L (ref 135–145)
Sodium: 139 mEq/L (ref 135–145)

## 2010-11-25 LAB — COMPREHENSIVE METABOLIC PANEL
ALT: 62 U/L — ABNORMAL HIGH (ref 0–53)
ALT: 61 U/L — ABNORMAL HIGH (ref 0–53)
Albumin: 3.8 g/dL (ref 3.5–5.2)
Alkaline Phosphatase: 177 U/L — ABNORMAL HIGH (ref 39–117)
BUN: 7 mg/dL (ref 6–23)
Calcium: 9 mg/dL (ref 8.4–10.5)
Calcium: 8.9 mg/dL (ref 8.4–10.5)
GFR calc Af Amer: >60 mL/min (ref >60)
Glucose, Bld: 105 mg/dL — ABNORMAL HIGH (ref 70–99)
Glucose, Bld: 99 mg/dL (ref 70–99)
Potassium: 4.1 mEq/L (ref 3.5–5.1)
Sodium: 136 mEq/L (ref 135–145)
Sodium: 139 mEq/L (ref 135–145)
Total Protein: 6.7 g/dL (ref 6.0–8.3)
Total Protein: 6.6 g/dL (ref 6.0–8.3)

## 2010-11-25 LAB — PROTIME-INR
INR: 1.8 — ABNORMAL HIGH (ref 0.00–1.49)
INR: 2 — ABNORMAL HIGH (ref 0.00–1.49)
INR: 1 (ref 0.00–1.49)
Prothrombin Time: 20.1 seconds — ABNORMAL HIGH (ref 11.6–15.2)
Prothrombin Time: 24.2 seconds — ABNORMAL HIGH (ref 11.6–15.2)
Prothrombin Time: 14.9 seconds (ref 11.6–15.2)
Prothrombin Time: 13.5 seconds (ref 11.6–15.2)

## 2010-11-25 LAB — URINALYSIS, ROUTINE W REFLEX MICROSCOPIC
Glucose, UA: NEGATIVE mg/dL
Ketones, ur: NEGATIVE mg/dL
Nitrite: NEGATIVE
Protein, ur: NEGATIVE mg/dL
Specific Gravity, Urine: 1.026 (ref 1.005–1.030)
Urobilinogen, UA: 0.2 mg/dL (ref 0.0–1.0)
pH: 6 (ref 5.0–8.0)

## 2010-11-25 LAB — URINE CULTURE

## 2010-11-25 LAB — ABO/RH: ABO/RH(D): B POS

## 2010-11-25 LAB — TYPE AND SCREEN: ABO/RH(D): B POS

## 2010-12-14 ENCOUNTER — Other Ambulatory Visit: Payer: Self-pay | Admitting: *Deleted

## 2010-12-14 DIAGNOSIS — Z79899 Other long term (current) drug therapy: Secondary | ICD-10-CM

## 2010-12-28 NOTE — H&P (Signed)
[name], [name]              ACCOUNT NO.:  [id number]   MEDICAL RECORD NO.:  [id number]          PATIENT TYPE:  INP   LOCATION:  1435                         FACILITY:  [facility]   PHYSICIAN:  Hillery Aldo, M.D.   DATE OF BIRTH:  02-05-1946   DATE OF ADMISSION:  10/25/2008  DATE OF DISCHARGE:                              HISTORY & PHYSICAL   PRIMARY CARE PHYSICIAN:  Dr. Merri Brunette.   UROLOGIST:  Dr. Isabel Caprice.   CARDIOLOGIST:  Dr. Elease Hashimoto.   NEUROLOGIST:  Dr. Sharene Skeans.   CHIEF COMPLAINT:  Right lower extremity swelling.   HISTORY OF PRESENT ILLNESS:  The patient is a 65 year old male who  presented to the emergency department with a 24-hour history of  increased right lower extremity swelling associated with numbness of the  toes.  He also endorsed moderate pain.  Apparently, the patient was  discharged from the emergency department and set up for an outpatient  Doppler study.  The ED physician subsequently reviewed his 12-lead EKG,  and called the patient and asked him to return because of concerns for a  pulmonary embolus.  The patient did return to the emergency department,  and had a CT scan of the chest which did show small bilateral pulmonary  emboli.  The patient was subsequently referred to the hospitalist  service for admission and treatment.  The patient still has some  moderate right-sided lower extremity pain and intermittent toe numbness.  He endorses some mild dyspnea over the past week.  He has been started  on Lovenox, and we will begin Coumadin and initiate teaching the patient  how to self-administer Lovenox with the goal of discharging him within  the next 24-48 hours.   PAST MEDICAL HISTORY:  1. T1c adenocarcinoma of the prostate status post radical      prostatectomy on October 20, 2008.  2. Severe obstructive sleep apnea/hypopnea syndrome requiring CPAP.  3. Mild ICA stenosis bilaterally.  4. History of laparoscopic cholecystectomy.  5. Neck cyst  excision in October 2008.  6. Gastroesophageal reflux disease.  7. Hypertension.  8. Questionable chronic pancreatitis.  9. Irritable bowel syndrome.  10.History of recurrent rectal fissures related to constipation.  11.History of transaminitis likely due to fatty liver disease.  12.History of heart murmur/dilated aorta.  13.Questionable history of multiple sclerosis.  14.Atherosclerosis with small vessel disease.  15.History of colon polyps.  16.Hiatal hernia.  17.History of bladder tumor status post transrectal ultrasound of the      prostate with ultrasound-guided biopsy x12, cystoscopy with left      retrograde pyelogram and interpretation, bladder biopsy with      fulguration in December 2009.  Biopsy results of the bladder did      reveal low grade papillary noninvasive urothelial carcinoma.  18.Depression and anxiety.  19.Status post appendectomy.  20.Status post tonsillectomy.   FAMILY HISTORY:  The patient's father is alive at 100, and has had  strokes and suffers with dementia.  The patient's mother is alive at 31,  and suffers with atherosclerosis and peripheral vascular disease.  She  also has atrial fibrillation.  The  patient has a twin brother who has  been diagnosed with benign thyroid cyst, heart murmur, fatty liver  disease, and chronic prostatitis.  He also has hypertension.  The  patient has 2 healthy offspring.   SOCIAL HISTORY:  The patient is married, and lives with his wife in  [place].  He occasionally smokes cigars.  He quit cigarette smoking  over 20 years ago, and prior to this had a less than 1 pack per day  habit.  He rarely drinks alcohol now.  Denies any illicit substance use  currently.  Has a remote history of marijuana use.   CURRENT MEDICATIONS:  1. Metoprolol 50 mg p.o. b.i.d.  2. Altace 10 mg p.o. daily.  3. Aciphex 20 mg p.o. daily.  4. Zoloft 100 mg p.o. daily.  5. Xanax 0.5 mg b.i.d. p.r.n. anxiety.  6. Propranolol 10 mg t.i.d. p.r.n.  palpitations.  7. Flonase 2 sprays in each nostril daily.  8. Aspirin 2 sprays in each nostril q.h.s. p.r.n.  9. Hydrochlorothiazide 3 times per week p.r.n. swelling.  10.Omega-3 fatty acids 1000 mg q.p.m.  11.Flaxseed oil 1200 mg q.p.m.  12.Aspirin 81 mg p.o. daily.  13.Multivitamin 1 tablet p.o. daily.  14.Resveratrol 1 tablet q.p.m.  15.Pomegranate 500 mg q.p.m.  16.Citrucel 4 tabs q.a.m. and p.m. and 2 tabs in the afternoon.  17.Glucosamine 1500 mg b.i.d.  18.Lactase 2 tablets q.a.c. p.r.n.  19.Vicodin 2 tablets q.4-6 hours p.r.n. pain.  20.Colace 100 mg t.i.d.   ALLERGIES:  PENICILLIN causes a rash.   REVIEW OF SYSTEMS:  CONSTITUTIONAL:  Positive chills but no fever.  No  weight loss.  Appetite has been good.  HEENT:  Chronic rhinitis.  Amblyopia.  Occasional pharyngitis related to postnasal drainage.  GI:  No nausea, vomiting or diarrhea.  Chronic constipation and hemorrhoids.  Occasionally has hematochezia from hemorrhoidal bleeding.  Had an  episode of rectal bleeding 3-4 months ago, and had an anoscopy at that  time that was negative.  He has not had any bleeding since that time.  Occasional dark stools.  INTEGUMENTARY:  No skin problems.  ENDOCRINE:  No history of diabetes or thyroid disease.  GENITOURINARY:  History of  chronic prostatitis.  Currently has a urinary catheter in.  HEMATOLOGY/LYMPHATICS:  No history of current problems.  History of  anemia in infancy.  EYES:  No diplopia or blurry vision.  Had surgery in  infancy to correct cross-eyedness.  CARDIOVASCULAR:  Occasional chest  pain for the past 3 years that he describes as a burning ache in the  right upper chest radiating to the throat, neck, and jaw.  These  episodes last anywhere from 15 seconds to 2 minutes.  He has been  evaluated by the cardiologist for this, and has had a negative stress  test.  MUSCULOSKELETAL:  Knee arthralgias bilaterally with a left knee  effusion.  Osteoarthritis.  NEUROLOGICAL:   The patient has occasional  dizziness.  Mild headaches.  Questionable history of transient ischemic  attacks per self-report.  ALLERGIES:  PENICILLIN.  RESPIRATIONS:  Mild  dyspnea over the past week.  Occasional cough attributable to postnasal  drainage.  PSYCHIATRIC:  Significant depression and anxiety.  Has never  had a psychiatric hospitalization.   VITAL SIGNS:  Temperature 98.2, blood pressure 148/89, pulse 123,  respirations 16, O2 saturation 95% on room air.  GENERAL:  A well-developed, well-nourished male who is in no apparent  distress.  HEENT::  Conjunctiva are noninjected.  Pupils are equal and  reactive to  light and accommodation.  Disks not visualized.  Tympanic membranes not  visualized.  Posterior pharyngeal erythema with cobblestoning and  postnasal drainage.  Fair dentition.  Nasal mucosa is boggy.  Hearing  intact bilaterally.  NECK:  Supple, no thyromegaly, no lymphadenopathy, no jugular venous  distention.  RESPIRATIONS:  Clear to auscultation bilaterally with fair effort.  Normal to percussion and palpation.  CARDIOVASCULAR:  Tachycardic rate,  regular rhythm.  No appreciable murmurs, rubs, or gallops, 2+ pedal and  radial pulses.  CHEST:  Normal to inspection.  GI:  Abdomen is soft with some mild tenderness around his incision site  from the prostatectomy.  No appreciable hepatosplenomegaly.  No hernias.  RECTAL:  Deferred at this time given his recent prostatectomy.  GENITOURINARY:  The patient has a Foley catheter in place draining  yellow urine with no obvious hematuria.  LYMPHATICS:  No palpable  lymphadenopathy in the neck, axilla or groin.  MUSCULOSKELETAL:  Moves all extremities x4 with equal strength.  Gait  not tested.  No muscle atrophy.  SKIN:  No rashes or lesions.  NEUROLOGIC:  Cranial nerves II-XII grossly intact.  Nonfocal.  PSYCHIATRIC:  The patient is alert and oriented x3.  Good memory and  affect is appropriate.   LABORATORY DATA REVIEW:   Sodium is 139, potassium 3.7, chloride 102,  bicarb 30, BUN 7, creatinine 0.9, glucose 99.  Total bilirubin 0.9,  alkaline phosphatase 136.  AST 46, ALT 61.  Albumin 3.6.  Protein 6.6.  PT is 13.5, PTT 33.  White blood cell count is 5.8, hemoglobin 12.7,  hematocrit 36.7, platelets 180 with an MCV of 88.4.   CT angiogram of the chest shows small bilateral pulmonary emboli.   ASSESSMENT AND PLAN:  1. Bilateral pulmonary emboli with a likely right lower extremity deep      venous thrombosis.  The patient will receive a right lower      extremity Doppler study to evaluate for residual lower extremity      clot.  He has been started on Lovenox, and we will resume this      along with Coumadin per pharmacy protocol.  We will teach the      patient and his wife how to self=administer Lovenox with the goal      of discharge within the next 24-48 hours if stable.  2. History of prostate cancer with recent prostatectomy:  The patient      can follow up with his urologist next week.  We will continue with      Foley catheter drainage.  3. Obstructive sleep apnea/hypopnea:  We will have the respiratory      therapist provide the patient with a continuous positive airway      pressure machine q.h.s.  4. Gastroesophageal reflux disease with hiatal hernia:  The patient      will maintain on his usual dose of proton pump inhibitor therapy.  5. Fatty liver with elevated liver function tests:  The patient will      be maintained on a lower fat diet.  6. Depression/anxiety:  Will continue the patient's usual dose of      Zoloft and Xanax as needed.  7. Irritable bowel syndrome:  The patient has constipation and      predominant irritable bowel syndrome.  We will continue his usual      home bowel regimen.  8. Hypertension:  Will monitor the patient's hemodynamic status      closely.  We will continue his home medications for now.  9. Prophylaxis:  The patient is being anticoagulated for known deep       venous thrombosis/pulmonary embolism.  He is also already on proton      pump inhibitor therapy for gastroesophageal reflux disease.   Time spent gathering data, analyzing data, interviewing, and examining  the patient, formulating plan of care approximately equal to 1 hour.      Hillery Aldo, M.D.  Electronically Signed     CR/MEDQ  D:  10/25/2008  T:  10/25/2008  Job:  [id number]   cc:   Soyla Murphy. Renne Crigler, M.D.  Fax: [phone]   Valetta Fuller, M.D.  Fax: [phone]   Vesta Mixer, M.D.  Fax: [phone]   Deanna Artis. Sharene Skeans, M.D.  Fax: [phone]

## 2010-12-28 NOTE — Op Note (Signed)
NAMECARSTEN, CARSTARPHEN              ACCOUNT NO.:  [id number]   MEDICAL RECORD NO.:  [id number]          PATIENT TYPE:  INP   LOCATION:  1409                         FACILITY:  [facility]   PHYSICIAN:  Valetta Fuller, M.D.  DATE OF BIRTH:  29-Apr-1946   DATE OF PROCEDURE:  10/20/2008  DATE OF DISCHARGE:                               OPERATIVE REPORT   PREOPERATIVE DIAGNOSIS:  Favorable clinical stage T1 C adenocarcinoma of  the prostate.   POSTOPERATIVE DIAGNOSIS:  Favorable clinical stage T1 C adenocarcinoma  of the prostate.   PROCEDURE PERFORMED:  Robotic assisted laparoscopic radical retropubic  prostatectomy.   SURGEON:  Valetta Fuller, M.D.   ASSISTANT:  Delia Chimes.   ANESTHESIA:  General endotracheal.   INDICATIONS:  Tomoya is 65 years of age.  He has had a slowly rising  PSA which went up recently to 3.5.  Due to velocity issues, we went  ahead and did an ultrasound.  This showed 3 of 12 cores positive for  relatively low-volume Gleason 3+3=6 adenocarcinoma of the prostate.  The  patient was felt to have favorable clinical stage T1 C disease.  The  patient has had some fairly longstanding issues with noninfectious  chronic prostatitis and voiding dysfunction.  We talked about multiple  options with him including active surveillance, radiation and/or  surgical removal.  After discussing the pros and cons of all these  options along with review of his previous voiding issues, we elected to  go ahead with surgical intervention.  The patient appeared to understand  the advantages and disadvantages of surgery of this magnitude as well as  potential complications and full informed consent was obtained.  The  patient has done a mechanical bowel prep.  He is receiving perioperative  antibiotics and placement of compression boots.   TECHNIQUE AND FINDINGS:  The patient was brought to the operating room.  He had successful induction of general endotracheal anesthesia.  All  extremities were carefully padded.  He was placed a mid lithotomy  position and carefully secured to the table.  He was placed in a steep  Trendelenburg position and then prepped and draped in usual manner.  Foley catheter was inserted sterilely on the field.   Camera port incision was chosen 18 cm above the pubic symphysis just to  the left the umbilicus.  Open standard Hasson technique was utilized  with no evidence of abdominal adhesions and abdominal insufflation  occurred without incident.  All other trocars were placed with direct  visual guidance including three 8-mm robotic assist trocars and a 12 mm  and 5 mm laparoscopic assist port.  Once all the ports were placed, a  surgical cart was docked, the bladder was filled to help identify the  space of Retzius which was then dissected with electrocautery scissors  and blunt dissecting technique.  Once retropubic space was fully  developed, superficial fat over the endopelvic fascia and bladder neck  were removed with hot electrocautery scissors.  Endopelvic fascia was  then incised from base to the apex of the prostate.  Levator musculature  was swept off  the apex of the prostate and the dorsal vein was stapled  with an ETS stapling device.  Anterior bladder neck was identified with  the aid of the Foley balloon and transected with electrocautery  scissors.  Foley catheter was found in the midline.  There was no  evidence of middle lobe.  The Foley catheter was retracted anteriorly.  Indigo Reshawn was given and we were well away from the ureteral  orifices.  Posterior bladder neck was then transected.  Seminal vesicles  and vas deferens were then found posteriorly and individually dissected  free and then retracted anteriorly.  The posterior plane between the  prostate and rectum was then established.  There did appear to be some  adhesions and stickiness in this area but no evidence to suggest obvious  tumor extension especially  given his preoperative numbers.   Attention was then turned towards bilateral nerve spare.  Superficial  fascia on the prostate was incised anterior laterally and then swept  posterior laterally.  The neurovascular bundle planes were then  established from apex to base.  The vascular pedicles of the prostate  were then identified and taken down with a series of locking clips.  Neurovascular bundle tissue was preserved on both sides.  The apex was  then transected leaving a nice urethral stump.  Some rectourethralis  tissue was sharply incised in the prostate specimen was removed from the  pelvis.  Copious pelvic irrigation was then performed.  Rectal  insufflation revealed no evidence of obvious rectal injury.  The bladder  neck did not appear to require any reconstruction.  Indigo Bodi was  again given and we were well away from the ureteral orifices.  The 6  o'clock urethral stump and 6 o'clock bladder neck were reapproximated  with an interrupted 2-0 Vicryl suture.  The rest anastomosis was done  with a double-armed 3-0 Monocryl suture in a 360 degrees running manner.  A new coude a catheter was placed.  Irrigation revealed no evidence of  any extravasation.  Pelvic drain was placed through one of the robotic  trocars and secured to the skin.  The 12-mm assist port site was closed  with a Vicryl suture with the aid of a suture passer.  All other trocars  were taken down with direct visual assistance and no obvious bleeding  was encountered.  The prostate specimen was placed an Endopouch  retrieval bag.  The camera port incision was extended slightly to allow  for removal the specimen.  That fascia was then closed with a running #1  Vicryl suture.  All incisions were infiltrated with Marcaine and closed  with clips.  The Foley catheter continued to drain a clear blue urine.  The patient tolerated the procedure well and no obvious complications.  He was brought to recovery room in  stable condition.      Valetta Fuller, M.D.  Electronically Signed     DSG/MEDQ  D:  10/20/2008  T:  10/21/2008  Job:  [id number]

## 2010-12-28 NOTE — Procedures (Signed)
[name], [name]              ACCOUNT NO.:  [id number]   MEDICAL RECORD NO.:  [id number]          PATIENT TYPE:  OUT   LOCATION:  SLEEP CENTER                 FACILITY:  [facility]   PHYSICIAN:  Clinton D. Maple Hudson, MD, FCCP, FACPDATE OF BIRTH:  12-23-47   DATE OF STUDY:  12/21/2007                            NOCTURNAL POLYSOMNOGRAM   REFERRING PHYSICIAN:  Cristal Deer E. Ezzard Standing, M.D.   INDICATION FOR STUDY:  Hypersomnia with sleep apnea.   Epworth sleepiness score 13/24, BMI 27.9, weight 206 pounds, height 72  inches, neck 15.5 inches.   HOME MEDICATIONS:  Charted and reviewed.   SLEEP ARCHITECTURE:  Split study protocol.  During the diagnostic phase,  total sleep time was 125 minutes with sleep efficiency 91.2%.  Stage 1  was 7.2%, stage 2 was  92.8%, stage 3 and REM were absent.  Sleep  latency 12 minutes, awake after sleep onset zero, arousal index 11.5.  Xanax was taken at 9:15 p.m.   RESPIRATORY DATA:  Split study protocol.  Apnea/hypopnea index (AHI)  56.2 per hour indicating severe obstructive sleep apnea/hypopnea  syndrome.  One hundred and seventeen events were scored with 13  obstructive apneas, 1 central apnea, 1 mixed apnea, and 102 hypopneas.  Most events were recorded as being while nonsupine.  CPAP was titrated  to 12 CWP, AHI zero per hour.  He chose a large ResMed Quattro full face  mask with heated humidifier.   OXYGEN DATA:  Moderate snoring before CPAP was prevented by CPAP.  Oxygen desaturation to a nadir of 76% before CPAP.  After CPAP control,  oxygen saturation held 94.2% on room air.   CARDIAC DATA:  PACs and PVCs.   MOVEMENT/PARASOMNIA:  No significant movement disturbance.  Bathroom x1.   IMPRESSION/RECOMMENDATION:  1. Severe obstructive sleep apnea/hypopnea syndrome, apnea/hypopnea      index 56.2 per hour with moderate snoring and oxygen desaturation      to a nadir of 76%.  2. Successful continuous positive airway pressure titration to 12  centimeters of water, apnea/hypopnea index 0 per hour.  He chose a      large ResMed Quattro mask with heated humidifier.      Clinton D. Maple Hudson, MD, [facility], FACP  Diplomate, Biomedical engineer of Sleep Medicine  Electronically Signed     CDY/MEDQ  D:  01/05/2008 18:36:49  T:  01/05/2008 04:54:09  Job:  [id number]

## 2010-12-28 NOTE — Procedures (Signed)
CAROTID DUPLEX EXAM   INDICATION:  Followup of plaque seen in the right carotid artery during  MRA.   HISTORY:  Diabetes:  No.  Cardiac:  Murmur.  Hypertension:  Yes.  Smoking:  Quit 20 years ago.  Previous Surgery:  No.  CV History:  No.  Amaurosis Fugax No, Paresthesias No, Hemiparesis No                                       RIGHT             LEFT  Brachial systolic pressure:         120               120  Brachial Doppler waveforms:         Biphasic          Biphasic  Vertebral direction of flow:        Antegrade         Antegrade  DUPLEX VELOCITIES (cm/sec)  CCA peak systolic                   90                91  ECA peak systolic                   95                73  ICA peak systolic                   19                100  ICA end diastolic                   72                20  PLAQUE MORPHOLOGY:                  Heterogenous      Heterogenous  PLAQUE AMOUNT:                      Mild              Mild  PLAQUE LOCATION:                    ICA               ICA   IMPRESSION:  1. 20-39% stenosis noted in bilateral ICAs.  2. Antegrade bilateral vertebral arteries.   ___________________________________________  Larina Earthly, M.D.   MG/MEDQ  D:  08/08/2007  T:  08/08/2007  Job:  [id number]

## 2010-12-28 NOTE — Op Note (Signed)
[name], CANAL              ACCOUNT NO.:  [id number]   MEDICAL RECORD NO.:  [id number]          PATIENT TYPE:  OIB   LOCATION:  5731                         FACILITY:  [facility]   PHYSICIAN:  Ollen Gross. Vernell Morgans, M.D. DATE OF BIRTH:  July 05, 1946   DATE OF PROCEDURE:  05/25/2007  DATE OF DISCHARGE:                               OPERATIVE REPORT   PREOPERATIVE DIAGNOSES:  1. Gallstones.  2. Cyst on the back of the neck.   POSTOPERATIVE DIAGNOSES:  1. Gallstones.  2. Cyst on the back of the neck.   PROCEDURE:  1. Laparoscopic cholecystectomy with intraoperative cholangiogram.  2. Excision of cyst from the back of the neck.   SURGEON:  Ollen Gross. Vernell Morgans, M.D.   ASSISTANT:  Lennie Muckle, M.D.   ANESTHESIA:  General endotracheal.   PROCEDURE:  After informed consent was obtained, the patient was brought  to the operating and placed in supine position on the operating room  table.  After adequate induction of general anesthesia, the patient's  abdomen was prepped with Betadine and draped in the usual sterile  manner.  The area below the umbilicus was infiltrated with 0.25%  Marcaine.   A small incision was made with a 15 blade knife.  This incision was  carried down through the subcutaneous tissue bluntly with the hemostat  and [employer] retractors until the linea alba was identified.  The linea  alba was incised with a 15 blade knife.  Each side was grasped with  Kocher clamps and elevated anteriorly.  The preperitoneal space was then  probed bluntly with a hemostat until the peritoneum was opened and  access was gained into the abdominal cavity.  A 0 Vicryl pursestring  stitch was placed in the fascia around the opening.  An Hasson cannula  was placed through the opening and anchored in place with the previously  placed Vicryl pursestring stitch.  The abdomen was then insufflated with  carbon dioxide without difficulty.  The patient was placed in reverse  Trendelenburg  position and rotated slightly with the right side up.  The  laparoscope was inserted through the Hasson cannula, and the right upper  quadrant was inspected.  The dome of gallbladder and liver were readily  identified.  Next, the epigastric region was infiltrated with 0.25%  Marcaine.  A small incision was made with a 15 blade knife.  A 10-mm  port was placed bluntly through this incision into the abdominal cavity  under direct vision.  The sites were then chosen laterally on the right  side of the abdomen for placement of the 5-mm ports.  Each of these  areas was infiltrated with 0.25% Marcaine.  Small stab incisions were  made with a 15 blade knife.  The 5-mm ports were then placed bluntly  through these incisions into the abdominal cavity under direct vision.  A blunt grasper was placed through the lateral-most 5-mm port and used  to grasp the dome of gallbladder and elevate it anteriorly and  superiorly.  Another blunt grasper was placed through the other 5-mm  port and used to retract  on the body and neck of the gallbladder.  A  dissector was placed through the epigastric port, and using the  electrocautery, the peritoneal reflection at the gallbladder neck was  opened.  Blunt dissection was then carried out in this area until the  gallbladder neck/cystic duct junction was readily identified and a good  window was created.  A single clip was placed on the gallbladder neck.  A small ductotomy was made just below the clip with the laparoscopic  scissors.  A 14-gauge Angiocath was placed percutaneously through the  anterior abdominal wall under direct vision.  A Reddick cholangiogram  catheter was placed through the Angiocath and flushed.  The Reddick  catheter was then placed within the cystic duct and anchored in place  with a clip.  A cholangiogram was obtained that showed no filling  defects, good emptying in the duodenum, and adequate length on the  cystic duct.  The anchoring clip  and catheters were then removed from  the patient.  Three clips were then placed proximally on the cystic  duct, and the duct was divided between the 2 sets of clips.  Posterior  to this, the cystic artery was identified.  Again, it was dissected  bluntly in a circumferential manner until a good window was created.  Two clips were placed proximally and one distally on the artery, and the  artery was divided between the two.  Next, a laparoscopic hook cautery  device was used to separate the gallbladder from the liver bed.  Prior  to completely detaching the gallbladder from liver bed, the liver bed  was inspected.  Several small bleeding points were coagulated with the  electrocautery until the area was completely hemostatic.  The  gallbladder was then detached the rest of the way from the liver bed  without difficulty.  A laparoscopic bag was inserted through the  epigastric port.  The gallbladder was placed within the bag, and the bag  was sealed.  The abdomen was then irrigated with copious amounts of  saline until the effluent was clear.  The liver bed was inspected again  and looked good.  The laparoscope was then moved to the epigastric port.  A gallbladder grasper was placed through the Hasson cannula and used to  grasp the open end of the bag.  The bag with the gallbladder was removed  through the infraumbilical port with the Hasson cannula without  difficulty.  The fascial defect was closed with the previously placed  Vicryl pursestring stitch as well as with another figure-of-eight 0  Vicryl stitch.  The rest of the ports were removed under direct vision  and were found to be hemostatic.  The gas was allowed to escape.  The  skin incisions were all closed with interrupted 4-0 Monocryl  subcuticular stitches.  Dermabond dressing was applied.   The patient was then undraped and moved into a left side up lateral  position.  All pressure points were padded.  The back of his neck  was  then prepped with Betadine and draped in the usual sterile manner.  A  small palpable cyst was located just below the hairline near the midline  of his neck.  This was excised in elliptical fashion sharply with a 15  blade knife.  Once this mass had been completely removed, it was sent to  pathology for further evaluation.  Hemostasis was achieved using the  Bovie electrocautery.  The wound was then closed with interrupted 4-0  Monocryl  stitches, and a Dermabond dressing was applied.   The patient tolerated the procedure well.  At the end of the case, all  needle, sponge, and instrument counts were correct.  The patient was  then awakened and taken to the recovery room in stable condition.      Ollen Gross. Vernell Morgans, M.D.  Electronically Signed     PST/MEDQ  D:  05/25/2007  T:  05/25/2007  Job:  [id number]

## 2010-12-28 NOTE — Discharge Summary (Signed)
[name], [name]              ACCOUNT NO.:  [id number]   MEDICAL RECORD NO.:  [id number]          PATIENT TYPE:  INP   LOCATION:  1409                         FACILITY:  [facility]   PHYSICIAN:  Valetta Fuller, M.D.  DATE OF BIRTH:  [birth year]   DATE OF ADMISSION:  10/20/2008  DATE OF DISCHARGE:  10/22/2008                               DISCHARGE SUMMARY   ADMISSION DIAGNOSIS:  Favorable clinical stage T1c adenocarcinoma of the  prostate.   DISCHARGE DIAGNOSIS:  Favorable clinical stage T1c adenocarcinoma of the  prostate.   PROCEDURES:  Robotic assisted laparoscopic radical prostatectomy  (bilateral nerve-sparing).   HISTORY AND PHYSICAL:  For full details, please see admission history  and physical.  Briefly, [name] is a 65 year old gentleman who was  found to have favorable clinical stage T1c adenocarcinoma of the  prostate.  After careful consideration regarding management options for  treatment, he elected to proceed with surgical therapy and robotic  assisted laparoscopic radical prostatectomy.   HOSPITAL COURSE:  On October 20, 2008, he was taken to the operating room  where he underwent the above-named procedure which he tolerated well  without complications.  Postoperatively, he was able to be transferred  to a regular hospital room following recovery from anesthesia.  He was  able to begin ambulation that evening.  He remained hemodynamically  stable.  His postoperative hematocrit was 42.0.  On the morning of  postoperative day #1 his hematocrit was also found to be stable at 37.0.  He maintained excellent urine output with minimal output from his pelvic  drain and therefore the pelvic drain was removed.  He was placed on a  clear liquid diet and continued to ambulate.  He was re-evaluated on the  afternoon of postoperative day #1.  He was able to tolerate his clear  liquid diet.  His urine output, blood pressure and pulse ox all remained  stable.  He did have low  grade fever which ranged from 98.1 to 99.8.  He  was very worried about any type of infection forming as he had had past  surgical experiences with fevers progressing to infection.  He was  educated on the necessity of ambulating and using his incentive  spirometer as well as cough and deep breathing to help to minimize his  low grade fevers.  He also stated that he would feel more comfortable  staying 1 more night for observation.  On postoperative day #2 he was  reassessed and he maintained excellent urine output. His vital signs  were stable and he was afebrile with a temperature of 98.5.  Therefore,  he was felt to be stable for discharge as he had met all discharge  criteria.   DISPOSITION:  Home.   DISCHARGE MEDICATIONS:  He was instructed to resume his regular home  medications including:  1. Metoprolol.  2. Altace.  3. Aciphex.  4. Zoloft.  5. Xanax.  6. Propranolol.  7. Flonase.  8. Hydrochlorothiazide.  9. He was told to hold all herbal supplements, aspirin and      multivitamin for 7 days postoperatively.  10.In addition, he was provided a prescription for Vicodin to take as      needed for pain.  11.He was told to use Colace as a stool softener.  12.He was also given a prescription for Cipro to begin 2 days prior to      followup appointment for removal of Foley catheter.   DISCHARGE INSTRUCTIONS:  He was instructed to be ambulatory but  specifically told to refrain from any heavy lifting, strenuous activity  or driving.  He was instructed on routine Foley catheter care and told  to gradually advance his diet over the course of the next few days.   FOLLOWUP:  He will follow up in 1 week for Foley catheter removal and  skin staple removal.      Delia Chimes, NP      Valetta Fuller, M.D.  Electronically Signed    MA/MEDQ  D:  10/22/2008  T:  10/22/2008  Job:  [id number]

## 2010-12-28 NOTE — Assessment & Plan Note (Signed)
[facility]                         GASTROENTEROLOGY OFFICE NOTE   NAME:[name], [name]                     MRN:          [id number]  DATE:01/16/2007                            DOB:          [birth year]    Haylen did not keep his appointment for his endoscopy and colonoscopy.  He has a new complaint today of constant sharp pain in the left  precordial area radiating laterally and into his armpit.  Has no  relationship to breathing.  No cardiac relationship.  No pulmonary or GI  relationship.  He remains obsessed with cancerophobia.  At his request,  I repeated his abdominal ultrasound, which just showed a large gall  stone, but otherwise was negative, including evaluation of his liver and  pancreas.  His serum trypsinogen level and C19-9 pancreatic tumor marker  were normal.  He does have a slightly elevated SGPT of questionable  etiology.  It previously was normal in March of 2007.  He is on a  variety of medications listed and reviewed in his chart, but none that  would routinely cause liver function tests elevation.   He received a copy of a CT scan report from October 18, 2004 that suggested  esophageal varices and is concerned.  Endoscopy did not confirm this  finding.   He weighs 210 pounds and blood pressure is 132/74.  Pulse was 88 and  regular.  CHEST:  Entirely clear to percussion and auscultation.  In a regular rhythm without murmurs, rubs, or gallops, or clicks.  ABDOMEN:  No organomegaly, masses, or tenderness.  Bowel sounds normal.   ASSESSMENT:  1. Probable musculoskeletal chest pain, costochondritis.  2. Chronic gastroesophageal reflux disease on proton pump inhibitor      therapy.  3. Asymptomatic gall stone.  4. Hypertensive cardiovascular disease.  5. No evidence of chronic pancreatitis on workup, and the patient had      no response to empiric pancreatic extracts.   RECOMMENDATIONS:  1. The patient is demanding CT scan of  the chest and abdomen, which I      have ordered.  2. He is scheduled for followup endoscopy and colonoscopy on January 31, 2007.  3. Continue other medications as listed above.     Vania Rea. Jarold Motto, MD, [facility], FAGA  Electronically Signed    DRP/MedQ  DD: 01/16/2007  DT: 01/16/2007  Job #: [id number]

## 2010-12-28 NOTE — Op Note (Signed)
[name], [name]              ACCOUNT NO.:  [id number]   MEDICAL RECORD NO.:  [id number]          PATIENT TYPE:  AMB   LOCATION:  DAY                          FACILITY:  [facility]   PHYSICIAN:  Valetta Fuller, M.D.  DATE OF BIRTH:  1946/04/14   DATE OF PROCEDURE:  07/30/2008  DATE OF DISCHARGE:  07/30/2008                               OPERATIVE REPORT   PREOPERATIVE DIAGNOSES:  1. Bladder tumor.  2. Left flank pain.  3. Elevated PSA.   POSTOPERATIVE DIAGNOSES:  1. Bladder tumor.  2. Left flank pain.  3. Elevated PSA.   PROCEDURE PERFORMED:  Transrectal ultrasound of the prostate with  ultrasound guided biopsy x12, cystoscopy with left retrograde pyelogram  and interpretation, bladder biopsy with fulguration.   SURGEON:  Valetta Fuller, M.D.   ANESTHESIA:  General.   INDICATIONS:  [name] is 65 years of age.  The patient has been a  longstanding patient of our practice who has had some bladder neck  obstruction and chronic prostatitis.  His PSA levels had been normal but  his PSA has been creeping upward.  The patient recently developed some  hematuria.  The patient has had cystoscopy in our office which revealed  what appeared to be a superficial well-differentiated papillary tumor  measuring between 1 and 2 cm near the left ureteral orifice.  The  patient now has also complained of some intermittent left flank  discomfort.  The patient now presents for transrectal ultrasound of his  prostate with ultrasound-guided biopsy, cystoscopy and biopsy of the  papillary tumor.  The patient appears to understand the advantages and  disadvantages of these various surgical procedures and full informed  consent has been obtained.   TECHNIQUE AND FINDINGS:  The patient was brought to the operating room.  He received perioperative ciprofloxacin.  He had successful induction of  general anesthesia and was placed in lithotomy position.  Transrectal  ultrasound probe was placed.   Representative images sagittally and  transverse of the prostate were taken.  Prostate appeared symmetric  without obvious hypoechoic areas and prostate volume was approximately  40 grams.  The 12 core biopsy was done in the standard manner with each  core sent separately.   At the completion of this flexible cystoscopy was performed.  The  patient had minimal lateral lobe tissue but a very high-riding and  prominent median bar.  The bladder showed some minimal trabecular  change.  Just superior to the left ureteral orifice was a papillary  tumor that again appeared to be superficial and well-differentiated.  Careful inspection of the entire bladder with 70 degree as well as 12  degree lens systems revealed no evidence of other tumors.   Retrograde pyelogram was done with an open-ended catheter.  This was  placed in the distal left ureter and contrast injected.  No filling  defects or obstruction noted and on fluoroscopy this was felt to be a  normal retrograde pyelogram.  The open-end stent was then left in the  ureteral orifice while we went ahead with cold cup biopsy of the  papillary tumor.  Once the tumor was removed the base was fulgurated  with electrocautery Bugbee electrode.  The orifice appeared to be  uninjured and therefore the open-ended catheter was removed.  The  bladder was copiously irrigated and hemostasis was excellent.  Lidocaine  jelly was instilled per urethra.  The patient appeared to tolerate the  procedure well without obvious complications or other problems.  He was  brought to the recovery room in stable condition.      Valetta Fuller, M.D.  Electronically Signed     DSG/MEDQ  D:  07/30/2008  T:  07/31/2008  Job:  [id number]

## 2010-12-28 NOTE — Discharge Summary (Signed)
[name], [name]              ACCOUNT NO.:  [id number]   MEDICAL RECORD NO.:  [id number]          PATIENT TYPE:  INP   LOCATION:  1435                         [facility]ACILITY:  [facility]   PHYSICIAN:  Hillery Aldo, M.D.   DATE OF BIRTH:  04/12/1946   DATE OF ADMISSION:  10/25/2008  DATE OF DISCHARGE:  10/27/2008                               DISCHARGE SUMMARY   PRIMARY CARE PHYSICIAN:  Dr. Merri Brunette.   UROLOGIST:  Dr. Isabel Caprice.   CARDIOLOGIST:  Dr. Elease Hashimoto.   NEUROLOGIST:  Dr. Sharene Ford.   DISCHARGE DIAGNOSES:  1. Bilateral pulmonary emboli.  2. T1C adenocarcinoma of the prostate status post radical      prostatectomy on October 20, 2008.  3. Severe obstructive sleep apnea/hypopnea syndrome requiring      continuous positive airway pressure.  4. Mild internal carotid artery stenosis bilaterally.  5. Gastroesophageal reflux.  6. Hypertension.  7. Questionable history of chronic pancreatitis.  8. Irritable bowel syndrome, constipation predominant.  9. Transaminitis secondary to fatty liver disease.  10.Possible multiple sclerosis.  11.Small vessel disease/atherosclerosis.  12.Hiatal hernia.  13.Depression and anxiety.   DISCHARGE MEDICATIONS:  1. Coumadin 10 mg p.o. daily or as directed by primary care physician.  2. Lovenox 100 mg subcutaneous every 12 hours until told to stop.  3. Metoprolol 50 mg b.i.d.  4. Altace 10 mg daily.  5. Aciphex 20 mg daily.  6. Zoloft 100 mg daily.  7. Xanax 0.5 mg b.i.d. p.r.n. anxiety.  8. Propranolol 10 mg t.i.d. p.r.n. palpitations.  9. Flonase 2 sprays in each nostril daily.  10.Afrin 2 sprays in each nostril q.h.s. p.r.n.  11.Hydrochlorothiazide 25 mg 3 times per week p.r.n. lower extremity      swelling.  12.Omega-3 fatty acids 1000 mg q.p.m.  13.Flaxseed oil 1200 mg q.p.m.  14.Aspirin 81 mg daily.  15.Multivitamin daily.  16.Resveratrol 1 tablet every evening.  17.Pomegranate 500 mg every evening.  18.Citrucel 4 tabs q.a.m. and p.m.,  2 tabs in the afternoon.  19.Glucosamine 1500 mg b.i.d.  20.Lactase 2 tablets q.a.c. p.r.n.  21.Vicodin 2 tablets every 4 to 6 hours p.r.n. pain.  22.Colace 100 mg t.i.d.   CONSULTATIONS:  None.   BRIEF ADMISSION HPI:  Patient is a 65 year old male who presented to the  hospital for evaluation of right lower extremity swelling associated  with numbness of the toes.  He subsequently was discharged and set up  for an outpatient Doppler study.  When the ED physician subsequently  reviewed his 12-lead EKG, he felt that there was some evidence there of  a possible pulmonary embolism and requested the patient return to the  emergency department for CT scan of the chest.  CT scanning of the chest  upon the patient's return did in fact reveal bilateral pulmonary emboli.  The patient was admitted for initiation of anticoagulation.  For the  full details, please see my dictated H and P.   PROCEDURES AND DIAGNOSTIC STUDIES:  1. CT angiogram of the chest on October 25, 2008, showed small emboli      within the pulmonary arterial branches at both bases.  2. Bilateral lower extremity Dopplers done on October 27, 2008:      Preliminary report shows no evidence of DVT, SVT, or Baker cyst      bilaterally.   DISCHARGE LABORATORY VALUES:  PT is 14.9, INR 1.1.  White blood cell  count 6.9, hemoglobin 12.7, hematocrit 36.4, platelets 190.   HOSPITAL COURSE BY PROBLEM:  1. Bilateral pulmonary emboli with likely recent right lower extremity      DVT:  Patient was admitted and Lovenox and Coumadin therapy was      initiated.  Patient was instructed on how to self-administer      Lovenox.  He has a followup appointment at his primary care      physician's Coumadin clinic at 10:30 a.m. on October 28, 2008.  2. Prostate cancer status post recent radical prostatectomy:  Patient      presented with a Foley catheter already in place.  He is to keep      the Foley catheter until he follows up with Dr. Isabel Caprice.  3.  Obstructive sleep apnea:  Patient was provided with nocturnal CPAP      machine.  4. Gastroesophageal reflux disease with hiatal hernia:  Patient was      maintained on proton pump inhibitor therapy.  5. Fatty liver with elevated transaminases:  Patient was maintained on      a low-fat diet.  6. Depression/anxiety:  Patient was maintained on Zoloft and Xanax as      at home.  His mood was stable.  7. Irritable bowel syndrome, constipation predominant:  Patient was      maintained on his usual bowel regimen.  8. Hypertension:  Patient's blood pressure was well-controlled on his      outpatient regimen.  9. Mild normocytic anemia:  Patient did have a 1-g drop in his      hemoglobin from admission when it was checked 24 hours later.  It      had returned to his baseline value when subsequently checked on the      date of discharge.   DISPOSITION:  The patient is medically stable and will be discharged  home.  He already has followup at the Coumadin clinic at Dr. Carolee Rota  office scheduled for 10:50 a.m. on October 28, 2008.  He is instructed to  follow up with Dr. Isabel Caprice this week.   TIME SPENT COORDINATING CARE FOR DISCHARGE AND DISCHARGE INSTRUCTIONS:  Equals 35 minutes.      Hillery Aldo, M.D.  Electronically Signed     CR/MEDQ  D:  10/27/2008  T:  10/27/2008  Job:  [id number]   cc:   [name], M.D.  Fax: [phone]   [name], M.D.  Fax: [phone]   [name], M.D.  Fax: [phone]   [name], M.D.  Fax: [phone]

## 2010-12-31 ENCOUNTER — Encounter: Payer: Self-pay | Admitting: Cardiology

## 2010-12-31 NOTE — Assessment & Plan Note (Signed)
HEALTHCARE                         GASTROENTEROLOGY OFFICE NOTE   NAME:[name], [name]                     MRN:          [id number]  DATE:12/14/2006                            DOB:          July 07, 1946    Blayton comes to the office today complaining of several months of  worsening epigastric pain radiating to his left upper quadrant into his  back with associated gas, bloating, and foul-smelling, fatty-type  stools.  He is greatly concerned because his grandmother had pancreatic  cancer.  He does have known gallstones.  He has never had surgery.  He  also suffers from irritable bowel syndrome, chronic acid reflux, and  takes daily Aciphex and fiber supplements along with Beano.  He had no  response previously to Amitiza.   Additional medications include:  1. Metoprolol 100 mg a day.  2. Altace 10 mg a day.  3. Citrucel daily.  4. Lactose tablets daily.  5. Flax seed daily.  6. Fish oil daily.  7. Multivitamins daily.  8. Omega-3 fatty acid daily.  9. Grape seed extract daily.  10.Saw palmetto daily.  11.Milk thistle daily.  12.Uroxatral daily for his prostate.  13.He uses p.r.n. Xanax.  14.AnaMantle.  15.Canasa suppositories for hemorrhoids.   He does have a history of PENICILLIN allergy.   Despite all of the above complaints, Trice has not had any anorexia or  weight loss.  He denies dysphagia, clay-colored stools, dark urine,  icterus, fever or chills.  His last colonoscopy was in March of 2005 and  last endoscopy February of 2006.  CLO  biopsy at that time was negative  for H. pylori.   LABORATORY DATA:  Done a year ago showed a normal metabolic profile.  His last CBC was in April of 2006 which was normal.  Last ultrasound was  in December of 2005 and CT scan in March of 2006.  CT scan suggested  esophageal varices.  There has been no abnormality noted of the liver or  pancreas on imaging studies of his gut.  He has known  lactose  intolerance proven by lactose breath testing.   The patient denies abuse of alcohol, NSAIDs, or cigarettes.  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  He recently had cardiac stress tests  within the last week.  It was normal.  Previous lab data showed normal C  19-9 pancreatic tumor markers which we have done because of his concern  about pancreatic carcinoma.  He sees Dr. Burton Apley for his primary  care.  He does have a history of recurrent rectal fissures.   PHYSICAL EXAMINATION:  Shows him to be a healthy-appearing white male,  appearing his stated age in no acute distress.  He weighs 209 pounds and blood pressure is 112/74, and pulse is 80 and  regular.  His chest was clear.  He was in a regular rhythm without murmurs, gallops, or rubs.  I could not appreciate hepatosplenomegaly, abdominal masses, or  tenderness.  Bowel sounds were normal.  Mental status was clear.  Peripheral extremities were unremarkable.  RECTAL EXAM:  Deferred.   ASSESSMENT:  1. Chronic gastroesophageal reflux disease with chronic proton pump      inhibitor therapy.  2. History of constipation-predominant irritable bowel syndrome with      associated gas and bloating and known lactose intolerance.  3. Possible steatorrhea and possible chronic pancreatitis, although      this has never been confirmed.  4. Consider chronic bacterial overgrowth syndrome.  5. History of recurrent rectal fissures associated with his      constipation.  There has never been any evidence of inflammatory      bowel disease.  6. Known gallstones.  7. Previous abnormal liver function tests.  Enzyme levels have been      normal.   RECOMMENDATIONS:  1. Repeat screening laboratory parameters.  2. Repeat ultrasound exam.  3. Followup endoscopy and colonoscopy.  4. Trial of Ultrase MT 20 tablets 1 p.o. t.i.d. with meals.  5. Check C 19-9 pancreatic tumor marker.  6. Consider endoscopic ultrasound of the pancreas if  workup,      otherwise, is negative.  7. Continue other medications per Dr. Su Hilt.     Vania Rea. Jarold Motto, MD, Caleen Essex, FAGA  Electronically Signed    DRP/MedQ  DD: 12/14/2006  DT: 12/14/2006  Job #: [id number]   cc:   Antony Madura, M.D.

## 2011-01-04 ENCOUNTER — Encounter: Payer: Self-pay | Admitting: Cardiovascular Disease

## 2011-01-04 ENCOUNTER — Ambulatory Visit (INDEPENDENT_AMBULATORY_CARE_PROVIDER_SITE_OTHER): Payer: 59 | Admitting: Cardiovascular Disease

## 2011-01-04 ENCOUNTER — Other Ambulatory Visit (INDEPENDENT_AMBULATORY_CARE_PROVIDER_SITE_OTHER): Payer: 59 | Admitting: *Deleted

## 2011-01-04 VITALS — BP 136/76 | HR 76 | Wt 208.0 lb

## 2011-01-04 DIAGNOSIS — I10 Essential (primary) hypertension: Secondary | ICD-10-CM

## 2011-01-04 DIAGNOSIS — E785 Hyperlipidemia, unspecified: Secondary | ICD-10-CM

## 2011-01-04 DIAGNOSIS — Z79899 Other long term (current) drug therapy: Secondary | ICD-10-CM

## 2011-01-04 LAB — BASIC METABOLIC PANEL
CO2: 29 mEq/L (ref 19–32)
Chloride: 105 mEq/L (ref 96–112)
Glucose, Bld: 99 mg/dL (ref 70–99)
Sodium: 144 mEq/L (ref 135–145)

## 2011-01-04 NOTE — Assessment & Plan Note (Signed)
His Crestor is currently on hold because of diffuse body aches. I've asked him on to restart his Crestor if his back and leg pains do not resolve after discontinuation of the Crestor. We'll need to find him a new cholesterol medicine if it turns out that he is intolerant to Crestor.

## 2011-01-04 NOTE — Assessment & Plan Note (Signed)
[name]. His blood pressure is well controlled on his current dose of medications. We increased his Altace during his last visit. We have labwork pending today. I'll see him again in 6 months for office visit and basic metabolic profile.

## 2011-01-04 NOTE — Progress Notes (Signed)
pcp placed med crestor on hold for two weeksJodette [employer] RN

## 2011-01-04 NOTE — Progress Notes (Signed)
[name] Date of Birth  Sep 22, 1945 [name] [address]     [address] [place], [place]  [address] [phone]  Fax  [phone]  History of Present Illness:  Pt has occasional episodes of vague chest pain. Some dyspnea.   Complains of diffuse body aches, legs, back, .  Dr. Renne Crigler discontinued his Crestor to see if his back and leg pains Will resolve.  He's exercising on a regular basis.  Current Outpatient Prescriptions on File Prior to Visit  Medication Sig Dispense Refill  . ALPRAZolam (XANAX) 0.5 MG tablet Take 0.5 mg by mouth 2 (two) times daily.        Marland Kitchen aspirin 81 MG tablet Take 81 mg by mouth daily.        Marland Kitchen b complex vitamins tablet Take 1 tablet by mouth daily.        Marland Kitchen co-enzyme Q-10 30 MG capsule Take 30 mg by mouth daily.        . fish oil-omega-3 fatty acids 1000 MG capsule Take 2 g by mouth daily.        . hydrochlorothiazide 25 MG tablet Take 25 mg by mouth daily. 4 times a week       . metoprolol (TOPROL-XL) 100 MG 24 hr tablet TAKE 1 & 1/2 TABLETS BY MOUTH EVERY DAY  45 tablet  12  . Multiple Vitamin (MULTIVITAMIN) tablet Take 1 tablet by mouth daily.        . NON FORMULARY CPAP at hs       . omeprazole (PRILOSEC) 20 MG capsule Take 20 mg by mouth daily.        . ramipril (ALTACE) 10 MG tablet Take 10 mg by mouth 2 (two) times daily.       . sertraline (ZOLOFT) 100 MG tablet Take 100 mg by mouth daily.        . sildenafil (VIAGRA) 50 MG tablet Take 50 mg by mouth daily as needed.        . rosuvastatin (CRESTOR) 10 MG tablet Take 10 mg by mouth daily.          Allergies  Allergen Reactions  . Aspirin     REACTION: gi upset with higher dose  . Iohexol      Code: HIVES, Desc: [name] explained to me on 10/25/08 that someone misunderstood him when he spoke about his hives reaction to IV dye.[name] stated that he only had a reaction to MRI IV dye, not CT dye. [name] also explained that he described his  "feeling" he had, Onset Date: [id number]   Code: HIVES, Desc: (continued),,"feeling" he had was consistent with what I described as the "normal" feeling most people have during CT IV dye administration. [name] stated he was fine and that the "breath"issue was his anxiousness and not actual dyspnea., Onset Date: [id number]   . Penicillins     REACTION: itching    Past Medical History  Diagnosis Date  . HTN (hypertension)   . GERD (gastroesophageal reflux disease)   . Prostate cancer   . DVT (deep venous thrombosis)     secondary to prostate surgery  . Chest pain   . Aortic dilatation   . Pulmonary embolism 2011  . History of echocardiogram 10/30/2009    EF 55-60%  . Tricuspid regurgitation 02/08/2008    trace  . Mitral regurgitation 10/30/2009    trace  . Apnea, sleep     uses cpap  at hs  . Hyperlipidemia   . Hiatal hernia     Past Surgical History  Procedure Date  . Prostate surgery   . Tonsillectomy   . Appendectomy   . Eye surgery     History  Smoking status  . Current Some Day Smoker  . Types: Cigars  Smokeless tobacco  . Not on file  Comment: 5 cigars /week    History  Alcohol Use: Not on file    Family History  Problem Relation Age of Onset  . Hypertension Mother   . Hypertension Father   . Stroke Father     Reviw of Systems:  Reviewed in the HPI.  All other systems are negative.  Physical Exam: BP 136/76  Pulse 76  Wt 208 lb (94.348 kg) The patient is alert and oriented x 3.  The mood and affect are normal.  The skin is warm and dry.  Color is normal.  The HEENT exam reveals that the sclera are nonicteric.  The mucous membranes are moist.  The carotids are 2+ without bruits.  There is no thyromegaly.  There is no JVD.  The lungs are clear.  The chest wall is non tender.  The heart exam reveals a regular rate with a normal S1 and S2.  There are no murmurs, gallops, or rubs.  The PMI is not displaced.   Abdominal exam reveals good bowel sounds.   There is no guarding or rebound.  There is no hepatosplenomegaly or tenderness.  There are no masses.  Exam of the legs reveal no clubbing, cyanosis, or edema.  The legs are without rashes.  The distal pulses are intact.  Cranial nerves II - XII are intact.  Motor and sensory functions are intact.  The gait is normal.  Assessment / Plan:

## 2011-01-05 ENCOUNTER — Telehealth: Payer: Self-pay | Admitting: *Deleted

## 2011-01-05 NOTE — Telephone Encounter (Signed)
Lab work reported 

## 2011-01-05 NOTE — Progress Notes (Signed)
Pt called with result

## 2011-03-05 ENCOUNTER — Encounter (HOSPITAL_BASED_OUTPATIENT_CLINIC_OR_DEPARTMENT_OTHER): Payer: Self-pay | Admitting: Emergency Medicine

## 2011-03-05 ENCOUNTER — Emergency Department (HOSPITAL_BASED_OUTPATIENT_CLINIC_OR_DEPARTMENT_OTHER): Payer: 59

## 2011-03-05 ENCOUNTER — Emergency Department (HOSPITAL_COMMUNITY)
Admission: EM | Admit: 2011-03-05 | Discharge: 2011-03-05 | Disposition: A | Discharge status: Home / Self Care | Payer: 59 | Attending: Emergency Medicine | Admitting: Emergency Medicine

## 2011-03-05 ENCOUNTER — Other Ambulatory Visit: Payer: Self-pay

## 2011-03-05 ENCOUNTER — Emergency Department (INDEPENDENT_AMBULATORY_CARE_PROVIDER_SITE_OTHER): Payer: 59

## 2011-03-05 ENCOUNTER — Emergency Department (HOSPITAL_COMMUNITY): Payer: 59

## 2011-03-05 ENCOUNTER — Emergency Department (HOSPITAL_BASED_OUTPATIENT_CLINIC_OR_DEPARTMENT_OTHER)
Admission: EM | Admit: 2011-03-05 | Discharge: 2011-03-05 | Disposition: A | Discharge status: Still In Hospital | Payer: 59 | Source: Home / Self Care | Attending: Emergency Medicine | Admitting: Emergency Medicine

## 2011-03-05 DIAGNOSIS — R29898 Other symptoms and signs involving the musculoskeletal system: Secondary | ICD-10-CM | POA: Insufficient documentation

## 2011-03-05 DIAGNOSIS — G459 Transient cerebral ischemic attack, unspecified: Secondary | ICD-10-CM | POA: Insufficient documentation

## 2011-03-05 DIAGNOSIS — Z8673 Personal history of transient ischemic attack (TIA), and cerebral infarction without residual deficits: Secondary | ICD-10-CM

## 2011-03-05 DIAGNOSIS — G319 Degenerative disease of nervous system, unspecified: Secondary | ICD-10-CM | POA: Insufficient documentation

## 2011-03-05 DIAGNOSIS — Z86711 Personal history of pulmonary embolism: Secondary | ICD-10-CM | POA: Insufficient documentation

## 2011-03-05 DIAGNOSIS — I10 Essential (primary) hypertension: Secondary | ICD-10-CM | POA: Insufficient documentation

## 2011-03-05 DIAGNOSIS — I679 Cerebrovascular disease, unspecified: Secondary | ICD-10-CM

## 2011-03-05 DIAGNOSIS — J150 Pneumonia due to Klebsiella pneumoniae: Secondary | ICD-10-CM

## 2011-03-05 DIAGNOSIS — R209 Unspecified disturbances of skin sensation: Secondary | ICD-10-CM | POA: Insufficient documentation

## 2011-03-05 DIAGNOSIS — R5381 Other malaise: Secondary | ICD-10-CM

## 2011-03-05 LAB — DIFFERENTIAL
Basophils Absolute: 0 10*3/uL (ref 0.0–0.1)
Basophils Relative: 0 % (ref 0–1)
Basophils Relative: 0 % (ref 0–1)
Eosinophils Absolute: 0.3 10*3/uL (ref 0.0–0.7)
Eosinophils Absolute: 0.2 10*3/uL (ref 0.0–0.7)
Lymphocytes Relative: 27 % (ref 12–46)
Lymphs Abs: 1.8 10*3/uL (ref 0.7–4.0)
Monocytes Relative: 8 % (ref 3–12)
Neutro Abs: 3.6 10*3/uL (ref 1.7–7.7)
Neutro Abs: 4.3 10*3/uL (ref 1.7–7.7)
Neutrophils Relative %: 57 % (ref 43–77)

## 2011-03-05 LAB — BASIC METABOLIC PANEL
BUN: 17 mg/dL (ref 6–23)
Chloride: 102 mEq/L (ref 96–112)
Creatinine, Ser: 1 mg/dL (ref 0.50–1.35)
GFR calc Af Amer: >60 mL/min (ref >60)
GFR calc non Af Amer: >60 mL/min (ref >60)
Potassium: 4.1 mEq/L (ref 3.5–5.1)

## 2011-03-05 LAB — TROPONIN I
Troponin I: <0.3 ng/mL (ref <0.30)
Troponin I: <0.3 ng/mL (ref <0.30)

## 2011-03-05 LAB — URINALYSIS, ROUTINE W REFLEX MICROSCOPIC
Bilirubin Urine: NEGATIVE
Glucose, UA: NEGATIVE mg/dL
Glucose, UA: NEGATIVE mg/dL
Hgb urine dipstick: NEGATIVE
Ketones, ur: NEGATIVE mg/dL
Ketones, ur: NEGATIVE mg/dL
Protein, ur: NEGATIVE mg/dL
Protein, ur: NEGATIVE mg/dL
pH: 8 (ref 5.0–8.0)
pH: 7.5 (ref 5.0–8.0)

## 2011-03-05 LAB — CBC
MCH: 30.1 pg (ref 26.0–34.0)
MCH: 30.9 pg (ref 26.0–34.0)
MCHC: 34.3 g/dL (ref 30.0–36.0)
MCHC: 35.2 g/dL (ref 30.0–36.0)
Platelets: 183 10*3/uL (ref 150–400)
Platelets: 169 10*3/uL (ref 150–400)
RDW: 13.7 % (ref 11.5–15.5)
RDW: 13.4 % (ref 11.5–15.5)

## 2011-03-05 LAB — PROTIME-INR
Prothrombin Time: 12.7 seconds (ref 11.6–15.2)
Prothrombin Time: 12.4 seconds (ref 11.6–15.2)

## 2011-03-05 LAB — CK TOTAL AND CKMB (NOT AT ARMC)
Relative Index: 3.7 — ABNORMAL HIGH (ref 0.0–2.5)
Relative Index: 3.2 — ABNORMAL HIGH (ref 0.0–2.5)

## 2011-03-05 LAB — COMPREHENSIVE METABOLIC PANEL
ALT: 32 U/L (ref 0–53)
AST: 26 U/L (ref 0–37)
Albumin: 3.9 g/dL (ref 3.5–5.2)
CO2: 29 mEq/L (ref 19–32)
Calcium: 9.5 mg/dL (ref 8.4–10.5)
Chloride: 102 mEq/L (ref 96–112)
GFR calc non Af Amer: >60 mL/min (ref >60)
Sodium: 139 mEq/L (ref 135–145)
Total Bilirubin: 0.5 mg/dL (ref 0.3–1.2)

## 2011-03-05 LAB — APTT: aPTT: 35 seconds (ref 24–37)

## 2011-03-05 NOTE — ED Notes (Signed)
Reports called to [facility] CDU Pt transferred stable Neuro grossly intact

## 2011-03-05 NOTE — ED Notes (Signed)
MD at bedside. 

## 2011-03-05 NOTE — ED Notes (Signed)
Pt reports resolution of any symtoms at this time Dr Ignacia Palma in at side benefits of transfer reviewed

## 2011-03-05 NOTE — ED Notes (Signed)
Patient denies pain and is resting comfortably.  

## 2011-03-05 NOTE — ED Notes (Signed)
Family at bedside. 

## 2011-03-05 NOTE — ED Provider Notes (Signed)
History     Chief Complaint  Patient presents with  . Extremity Weakness   HPI Comments: Pt says that an hour and 20 minutes ago he had a strange feeling in his head, like lightheadedness, with weakness in the left arm and leg.  This lasted about 2 minutes. He feels very slight weakness on the left side now.  He has had prior neurologic workup for lightheadedness and dizziness with MRI, done by Dr. Sharene Skeans of [facility] some years ago.  He has some "spots" on his brain, but was never diagnosed or treated for multiple sclerosis.  He has never had a TIA or stroke.  Patient is a 65 y.o. male presenting with extremity weakness. The history is provided by the patient and the spouse. No language interpreter was used.  Extremity Weakness This is a new problem. The current episode started 1 to 2 hours ago. The problem has been rapidly improving. The symptoms are aggravated by nothing. The symptoms are relieved by nothing. He has tried nothing for the symptoms.    Past Medical History  Diagnosis Date  . HTN (hypertension)   . GERD (gastroesophageal reflux disease)   . Prostate cancer   . DVT (deep venous thrombosis)     secondary to prostate surgery  . Chest pain   . Aortic dilatation   . Pulmonary embolism 2011  . History of echocardiogram 10/30/2009    EF 55-60%  . Tricuspid regurgitation 02/08/2008    trace  . Mitral regurgitation 10/30/2009    trace  . Apnea, sleep     uses cpap at hs  . Hyperlipidemia   . Hiatal hernia     Past Surgical History  Procedure Date  . Prostate surgery   . Tonsillectomy   . Appendectomy   . Eye surgery   . Bladder surgery     Family History  Problem Relation Age of Onset  . Hypertension Mother   . Hypertension Father   . Stroke Father     History  Substance Use Topics  . Smoking status: Current Everyday Smoker    Types: Cigars  . Smokeless tobacco: Not on file   Comment: 5 cigars /week  . Alcohol Use: 0.0 oz/week     2  glasses wine/day      Review of Systems  HENT: Negative.   Eyes: Negative.   Respiratory: Negative.   Cardiovascular: Negative.   Gastrointestinal: Negative.   Genitourinary: Negative.   Musculoskeletal: Positive for extremity weakness.  Skin: Negative.   Neurological: Positive for dizziness, weakness and light-headedness.  Psychiatric/Behavioral: Negative.     Physical Exam  BP 178/86  Pulse 72  Temp(Src) 97.5 F (36.4 C) (Oral)  Ht 5\' 11"  (1.803 m)  Wt 204 lb (92.534 kg)  BMI 28.45 kg/m2  SpO2 97%  Physical Exam  Constitutional: He is oriented to person, place, and time. He appears well-developed and well-nourished. No distress.  HENT:  Head: Normocephalic and atraumatic.  Right Ear: External ear normal.  Left Ear: External ear normal.  Eyes: Conjunctivae and EOM are normal. Pupils are equal, round, and reactive to light.  Neck: Normal range of motion. Neck supple. No thyromegaly present.       No carotid bruit.  Cardiovascular: Normal rate, regular rhythm and normal heart sounds.   Pulmonary/Chest: Effort normal and breath sounds normal.  Abdominal: Soft. Bowel sounds are normal.  Musculoskeletal: Normal range of motion.  Neurological: He is alert and oriented to person, place, and time. He has  normal strength. A cranial nerve deficit is present. No sensory deficit. He displays a negative Romberg sign. Abnormal coordination: Gait normal. GCS eye subscore is 4. GCS verbal subscore is 5. GCS motor subscore is 6.  Reflex Scores:      Bicep reflexes are 2+ on the right side and 2+ on the left side.      Patellar reflexes are 2+ on the right side and 2+ on the left side. Skin: Skin is warm and dry.  Psychiatric: He has a normal mood and affect.    ED Course  Procedures  MDM   ED ECG REPORT   Date: 03/05/2011  EKG Time: 4:17 PM  Rate: 67 Rhythm: normal sinus rhythm,  normal EKG, normal sinus rhythm, unchanged from previous tracings  Axis: WNL  Intervals:none   ST&T Change:  WNL  Narrative Interpretation: WNL           Pt seen STAT on arrival --> physical exam performed.  Lab workup ordered.  STAT CT of head was negative.  Call to Thana Farr, M.D., on call for Stroke Team, who recommended placing pt on the TIA protocol in the CDU at [facility] ED.   04:20 P.M.  Case discussed with Dr. Emeline General at [facility] ED [facility], who accepts pt in transfer to the CDU for TIA Protocol.     Carleene Cooper III, MD 03/05/11 2103

## 2011-03-05 NOTE — ED Notes (Signed)
Patient is resting comfortably. 

## 2011-03-06 ENCOUNTER — Emergency Department (HOSPITAL_COMMUNITY): Payer: 59

## 2011-03-06 DIAGNOSIS — I517 Cardiomegaly: Secondary | ICD-10-CM

## 2011-03-06 LAB — CK TOTAL AND CKMB (NOT AT ARMC)
CK, MB: 4.4 ng/mL — ABNORMAL HIGH (ref 0.3–4.0)
Relative Index: 3.3 — ABNORMAL HIGH (ref 0.0–2.5)
Total CK: 134 U/L (ref 7–232)

## 2011-03-06 LAB — LIPID PANEL
Cholesterol: 236 mg/dL — ABNORMAL HIGH (ref 0–200)
HDL: 34 mg/dL — ABNORMAL LOW
LDL Cholesterol: 174 mg/dL — ABNORMAL HIGH (ref 0–99)
Total CHOL/HDL Ratio: 6.9 ratio
Triglycerides: 141 mg/dL
VLDL: 28 mg/dL (ref 0–40)

## 2011-03-06 LAB — HEMOGLOBIN A1C
Hgb A1c MFr Bld: 5.7 % — ABNORMAL HIGH
Mean Plasma Glucose: 117 mg/dL — ABNORMAL HIGH

## 2011-03-06 LAB — TROPONIN I: Troponin I: <0.3 ng/mL

## 2011-03-16 ENCOUNTER — Other Ambulatory Visit: Payer: Self-pay | Admitting: Oncology

## 2011-03-16 ENCOUNTER — Encounter (HOSPITAL_BASED_OUTPATIENT_CLINIC_OR_DEPARTMENT_OTHER): Payer: 59 | Admitting: Oncology

## 2011-03-16 DIAGNOSIS — Z86718 Personal history of other venous thrombosis and embolism: Secondary | ICD-10-CM

## 2011-03-16 LAB — CBC WITH DIFFERENTIAL/PLATELET
BASO%: 0.3 % (ref 0.0–2.0)
EOS%: 5 % (ref 0.0–7.0)
HGB: 14.7 g/dL (ref 13.0–17.1)
MCH: 31.3 pg (ref 27.2–33.4)
MCHC: 34.6 g/dL (ref 32.0–36.0)
RBC: 4.69 10*6/uL (ref 4.20–5.82)
RDW: 13.6 % (ref 11.0–14.6)
lymph#: 1.7 10*3/uL (ref 0.9–3.3)

## 2011-03-16 LAB — COMPREHENSIVE METABOLIC PANEL
ALT: 25 U/L (ref 0–53)
AST: 23 U/L (ref 0–37)
Albumin: 4.4 g/dL (ref 3.5–5.2)
Calcium: 9.7 mg/dL (ref 8.4–10.5)
Chloride: 102 mEq/L (ref 96–112)
Potassium: 4 mEq/L (ref 3.5–5.3)
Sodium: 139 mEq/L (ref 135–145)

## 2011-03-21 ENCOUNTER — Encounter (HOSPITAL_BASED_OUTPATIENT_CLINIC_OR_DEPARTMENT_OTHER): Payer: 59 | Admitting: Oncology

## 2011-03-21 DIAGNOSIS — Z8551 Personal history of malignant neoplasm of bladder: Secondary | ICD-10-CM

## 2011-03-21 DIAGNOSIS — R52 Pain, unspecified: Secondary | ICD-10-CM

## 2011-03-21 DIAGNOSIS — C61 Malignant neoplasm of prostate: Secondary | ICD-10-CM

## 2011-03-23 ENCOUNTER — Other Ambulatory Visit: Payer: Self-pay | Admitting: Oncology

## 2011-03-23 DIAGNOSIS — C61 Malignant neoplasm of prostate: Secondary | ICD-10-CM

## 2011-03-29 ENCOUNTER — Emergency Department (INDEPENDENT_AMBULATORY_CARE_PROVIDER_SITE_OTHER): Payer: 59

## 2011-03-29 ENCOUNTER — Encounter (HOSPITAL_BASED_OUTPATIENT_CLINIC_OR_DEPARTMENT_OTHER): Payer: Self-pay | Admitting: *Deleted

## 2011-03-29 ENCOUNTER — Emergency Department (HOSPITAL_BASED_OUTPATIENT_CLINIC_OR_DEPARTMENT_OTHER)
Admission: EM | Admit: 2011-03-29 | Discharge: 2011-03-30 | Disposition: A | Discharge status: Other Acute Inpatient Hospital | Payer: 59 | Source: Home / Self Care | Attending: Emergency Medicine | Admitting: Emergency Medicine

## 2011-03-29 ENCOUNTER — Other Ambulatory Visit: Payer: Self-pay

## 2011-03-29 DIAGNOSIS — K219 Gastro-esophageal reflux disease without esophagitis: Secondary | ICD-10-CM | POA: Insufficient documentation

## 2011-03-29 DIAGNOSIS — R600 Localized edema: Secondary | ICD-10-CM

## 2011-03-29 DIAGNOSIS — M79609 Pain in unspecified limb: Secondary | ICD-10-CM | POA: Insufficient documentation

## 2011-03-29 DIAGNOSIS — R609 Edema, unspecified: Secondary | ICD-10-CM | POA: Insufficient documentation

## 2011-03-29 DIAGNOSIS — R0602 Shortness of breath: Secondary | ICD-10-CM | POA: Insufficient documentation

## 2011-03-29 DIAGNOSIS — M7989 Other specified soft tissue disorders: Secondary | ICD-10-CM

## 2011-03-29 DIAGNOSIS — R079 Chest pain, unspecified: Secondary | ICD-10-CM | POA: Insufficient documentation

## 2011-03-29 DIAGNOSIS — R29898 Other symptoms and signs involving the musculoskeletal system: Secondary | ICD-10-CM | POA: Insufficient documentation

## 2011-03-29 DIAGNOSIS — R071 Chest pain on breathing: Secondary | ICD-10-CM

## 2011-03-29 DIAGNOSIS — I10 Essential (primary) hypertension: Secondary | ICD-10-CM | POA: Insufficient documentation

## 2011-03-29 LAB — CBC
HCT: 40.4 % (ref 39.0–52.0)
MCH: 29.9 pg (ref 26.0–34.0)
MCV: 88.2 fL (ref 78.0–100.0)
Platelets: 159 10*3/uL (ref 150–400)
RDW: 13.6 % (ref 11.5–15.5)

## 2011-03-29 LAB — DIFFERENTIAL
Basophils Absolute: 0 10*3/uL (ref 0.0–0.1)
Eosinophils Absolute: 0.3 10*3/uL (ref 0.0–0.7)
Eosinophils Relative: 4 % (ref 0–5)
Lymphs Abs: 2 10*3/uL (ref 0.7–4.0)
Monocytes Absolute: 0.4 10*3/uL (ref 0.1–1.0)

## 2011-03-29 LAB — BASIC METABOLIC PANEL
Calcium: 9.2 mg/dL (ref 8.4–10.5)
Creatinine, Ser: 0.9 mg/dL (ref 0.50–1.35)
GFR calc non Af Amer: >60 mL/min (ref >60)
Glucose, Bld: 89 mg/dL (ref 70–99)
Sodium: 139 mEq/L (ref 135–145)

## 2011-03-29 LAB — CARDIAC PANEL(CRET KIN+CKTOT+MB+TROPI)
Relative Index: 3.2 — ABNORMAL HIGH (ref 0.0–2.5)
Total CK: 229 U/L (ref 7–232)

## 2011-03-29 MED ORDER — IOHEXOL 350 MG/ML SOLN
80.0000 mL | Freq: Once | INTRAVENOUS | Status: AC | PRN
  Administered 2011-03-29: 80 mL via INTRAVENOUS

## 2011-03-29 MED ORDER — SODIUM CHLORIDE 0.9 % IJ SOLN
3.0000 mL | INTRAMUSCULAR | Status: DC | PRN
  Filled 2011-03-29: qty 3

## 2011-03-29 MED ORDER — DIPHENHYDRAMINE HCL 50 MG/ML IJ SOLN
25.0000 mg | Freq: Once | INTRAMUSCULAR | Status: AC
  Administered 2011-03-29: 25 mg via INTRAVENOUS
  Filled 2011-03-29: qty 1

## 2011-03-29 MED ORDER — SODIUM CHLORIDE 0.9 % IJ SOLN
3.0000 mL | Freq: Two times a day (BID) | INTRAMUSCULAR | Status: DC
  Filled 2011-03-29: qty 3

## 2011-03-29 MED ORDER — PREDNISONE 20 MG PO TABS
20.0000 mg | ORAL_TABLET | Freq: Once | ORAL | Status: AC
  Administered 2011-03-29: 20 mg via ORAL
  Filled 2011-03-29: qty 1

## 2011-03-29 MED ORDER — ENOXAPARIN SODIUM 60 MG/0.6ML ~~LOC~~ SOLN
60.0000 mg | Freq: Once | SUBCUTANEOUS | Status: AC
  Administered 2011-03-29: 60 mg via SUBCUTANEOUS
  Filled 2011-03-29: qty 0.6

## 2011-03-29 MED ORDER — SODIUM CHLORIDE 0.9 % IV SOLN: 250.0000 mL | INTRAVENOUS | Status: DC

## 2011-03-29 NOTE — ED Notes (Signed)
MD at bedside.  Pt assessed.

## 2011-03-29 NOTE — ED Notes (Signed)
Pt has hx of DVT and PE in past. Was having right lower leg pain earlier today then developed left sided CP and some SOB. Denies CP at this time but still has lower leg pain. Pt denies SOB at this time, has also had a dry cough for few days.

## 2011-03-29 NOTE — ED Notes (Signed)
Pt return from CT, IV site unremarkable. SR on monitor, resps even and unlabored, denies pain. Family at bs.

## 2011-03-29 NOTE — ED Notes (Signed)
SR on monitor, denies CP at this time, resps even and unlabored.

## 2011-03-29 NOTE — ED Notes (Signed)
Pt c/o right knee pain and leg pain seen by pmd for same,

## 2011-03-29 NOTE — ED Notes (Signed)
SR on monitor, resps even and unlabored, IV site unremarkable.Pt c/o slight chest pain that worsens with movement 2/10, denies SOB.

## 2011-03-29 NOTE — ED Provider Notes (Addendum)
History     CSN: [id number] Arrival date & time: 03/29/2011  8:13 PM  Chief Complaint  Patient presents with  . Leg Pain   Patient is a 65 y.o. male presenting with leg pain.  Leg Pain  The incident occurred more than 2 days ago. The incident occurred at home. There was no injury mechanism. The pain is present in the right leg. The pain is moderate. Associated symptoms include muscle weakness. Pertinent negatives include no numbness, no inability to bear weight, no loss of motion, no loss of sensation and no tingling.   Very pleasant patient with a known history of prostate cancer. He also has a known history of blood clots after his prostatectomy patient has noted pain in his right lower extremity over the past several days he did see his primary care doctor who is going to send him for a "bone scan".  His wife had noticed some edema in the lower extremity as well. Patient did not initially complain of chest pain or shortness of breath however he states that upon further query, he did note some chest pain and shortness of breath earlier today he also endorses pleuritic chest pain. Patient is in no distress at this present time.  Marguerite  and he is currently not on blood thinners however he was on Coumadin in the past. Past Medical History  Diagnosis Date  . HTN (hypertension)   . GERD (gastroesophageal reflux disease)   . Prostate cancer   . DVT (deep venous thrombosis)     secondary to prostate surgery  . Chest pain   . Aortic dilatation   . Pulmonary embolism 2011  . History of echocardiogram 10/30/2009    EF 55-60%  . Tricuspid regurgitation 02/08/2008    trace  . Mitral regurgitation 10/30/2009    trace  . Apnea, sleep     uses cpap at hs  . Hyperlipidemia   . Hiatal hernia   . DVT (deep vein thrombosis) in pregnancy     Past Surgical History  Procedure Date  . Prostate surgery   . Tonsillectomy   . Appendectomy   . Eye surgery   . Bladder surgery     Family History    Problem Relation Age of Onset  . Hypertension Mother   . Hypertension Father   . Stroke Father     History  Substance Use Topics  . Smoking status: Current Everyday Smoker    Types: Cigars  . Smokeless tobacco: Not on file   Comment: 5 cigars /week  . Alcohol Use: 0.0 oz/week     2 glasses wine/day      Review of Systems  HENT: Negative for facial swelling.   Respiratory: Positive for shortness of breath.   Cardiovascular: Positive for chest pain.  Musculoskeletal: Negative for back pain.  Neurological: Negative for tingling and numbness.  All other systems reviewed and are negative.    Physical Exam  BP 119/73  Pulse 78  Temp(Src) 98.3 F (36.8 C) (Oral)  Resp 16  Wt 205 lb (92.987 kg)  SpO2 100%  Physical Exam  ED Course  Procedures  MDM Pt is seen and examined;  Initial history and physical completed.  Will follow.        Hines Kloss A. Patrica Duel, MD 03/29/11 2019  Theron Arista A. Patrica Duel, MD 03/29/11 2031  8:37 PM  Date: 03/29/2011  Rate: 82   Rhythm: normal sinus rhythm  QRS Axis: normal  Intervals: normal  ST/T Wave abnormalities: normal  Conduction Disutrbances:right bundle branch block  Narrative Interpretation:   Old EKG Reviewed: unchanged    Malori Myers A. Patrica Duel, MD 03/29/11 2038    Lorelle Gibbs. Patrica Duel, MD 03/29/11 2131

## 2011-03-29 NOTE — ED Notes (Signed)
rec'd call from lab resulting critical CKMB of 7.3, MD made aware.

## 2011-03-30 ENCOUNTER — Inpatient Hospital Stay (HOSPITAL_COMMUNITY)
Admit: 2011-03-30 | Discharge: 2011-03-30 | DRG: 563 | Disposition: A | Discharge status: Home / Self Care | Payer: 59 | Source: Other Acute Inpatient Hospital | Attending: Internal Medicine | Admitting: Internal Medicine

## 2011-03-30 DIAGNOSIS — I10 Essential (primary) hypertension: Secondary | ICD-10-CM | POA: Diagnosis present

## 2011-03-30 DIAGNOSIS — C61 Malignant neoplasm of prostate: Secondary | ICD-10-CM | POA: Diagnosis present

## 2011-03-30 DIAGNOSIS — K861 Other chronic pancreatitis: Secondary | ICD-10-CM | POA: Diagnosis present

## 2011-03-30 DIAGNOSIS — Z860100 Personal history of colon polyps, unspecified: Secondary | ICD-10-CM

## 2011-03-30 DIAGNOSIS — E876 Hypokalemia: Secondary | ICD-10-CM | POA: Diagnosis present

## 2011-03-30 DIAGNOSIS — Z7982 Long term (current) use of aspirin: Secondary | ICD-10-CM

## 2011-03-30 DIAGNOSIS — M7989 Other specified soft tissue disorders: Secondary | ICD-10-CM

## 2011-03-30 DIAGNOSIS — Y9355 Activity, bike riding: Secondary | ICD-10-CM

## 2011-03-30 DIAGNOSIS — Z8551 Personal history of malignant neoplasm of bladder: Secondary | ICD-10-CM

## 2011-03-30 DIAGNOSIS — I669 Occlusion and stenosis of unspecified cerebral artery: Secondary | ICD-10-CM | POA: Diagnosis present

## 2011-03-30 DIAGNOSIS — K219 Gastro-esophageal reflux disease without esophagitis: Secondary | ICD-10-CM | POA: Diagnosis present

## 2011-03-30 DIAGNOSIS — Z86711 Personal history of pulmonary embolism: Secondary | ICD-10-CM

## 2011-03-30 DIAGNOSIS — K589 Irritable bowel syndrome without diarrhea: Secondary | ICD-10-CM | POA: Diagnosis present

## 2011-03-30 DIAGNOSIS — Z86718 Personal history of other venous thrombosis and embolism: Secondary | ICD-10-CM

## 2011-03-30 DIAGNOSIS — K449 Diaphragmatic hernia without obstruction or gangrene: Secondary | ICD-10-CM | POA: Diagnosis present

## 2011-03-30 DIAGNOSIS — X503XXA Overexertion from repetitive movements, initial encounter: Secondary | ICD-10-CM | POA: Diagnosis present

## 2011-03-30 DIAGNOSIS — F17200 Nicotine dependence, unspecified, uncomplicated: Secondary | ICD-10-CM | POA: Diagnosis present

## 2011-03-30 DIAGNOSIS — Y998 Other external cause status: Secondary | ICD-10-CM

## 2011-03-30 DIAGNOSIS — I059 Rheumatic mitral valve disease, unspecified: Secondary | ICD-10-CM | POA: Diagnosis present

## 2011-03-30 DIAGNOSIS — F341 Dysthymic disorder: Secondary | ICD-10-CM | POA: Diagnosis present

## 2011-03-30 DIAGNOSIS — E785 Hyperlipidemia, unspecified: Secondary | ICD-10-CM | POA: Diagnosis present

## 2011-03-30 DIAGNOSIS — R079 Chest pain, unspecified: Secondary | ICD-10-CM | POA: Diagnosis present

## 2011-03-30 DIAGNOSIS — IMO0002 Reserved for concepts with insufficient information to code with codable children: Principal | ICD-10-CM | POA: Diagnosis present

## 2011-03-30 DIAGNOSIS — I079 Rheumatic tricuspid valve disease, unspecified: Secondary | ICD-10-CM | POA: Diagnosis present

## 2011-03-30 DIAGNOSIS — G4733 Obstructive sleep apnea (adult) (pediatric): Secondary | ICD-10-CM | POA: Diagnosis present

## 2011-03-30 LAB — COMPREHENSIVE METABOLIC PANEL
Albumin: 3.9 g/dL (ref 3.5–5.2)
BUN: 13 mg/dL (ref 6–23)
Calcium: 9.4 mg/dL (ref 8.4–10.5)
Creatinine, Ser: 0.99 mg/dL (ref 0.50–1.35)
GFR calc Af Amer: >60 mL/min (ref >60)
Glucose, Bld: 129 mg/dL — ABNORMAL HIGH (ref 70–99)
Potassium: 4.1 mEq/L (ref 3.5–5.1)
Total Protein: 6.8 g/dL (ref 6.0–8.3)

## 2011-03-30 LAB — LIPID PANEL
Cholesterol: 156 mg/dL (ref 0–200)
HDL: 43 mg/dL (ref >39)
Total CHOL/HDL Ratio: 3.6 RATIO
Triglycerides: 82 mg/dL (ref <150)
VLDL: 16 mg/dL (ref 0–40)

## 2011-03-30 LAB — CARDIAC PANEL(CRET KIN+CKTOT+MB+TROPI)
CK, MB: 4.6 ng/mL — ABNORMAL HIGH (ref 0.3–4.0)
CK, MB: 3.6 ng/mL (ref 0.3–4.0)
Relative Index: 3 — ABNORMAL HIGH (ref 0.0–2.5)
Relative Index: 3.2 — ABNORMAL HIGH (ref 0.0–2.5)
Total CK: 165 U/L (ref 7–232)
Total CK: 133 U/L (ref 7–232)
Total CK: 111 U/L (ref 7–232)
Troponin I: <0.3 ng/mL (ref <0.30)

## 2011-03-30 LAB — CBC
HCT: 42.7 % (ref 39.0–52.0)
MCHC: 34 g/dL (ref 30.0–36.0)
Platelets: 181 10*3/uL (ref 150–400)
RDW: 13.7 % (ref 11.5–15.5)
WBC: 7.1 10*3/uL (ref 4.0–10.5)

## 2011-03-31 ENCOUNTER — Encounter (HOSPITAL_COMMUNITY): Payer: Self-pay

## 2011-03-31 ENCOUNTER — Encounter (HOSPITAL_COMMUNITY)
Admission: RE | Admit: 2011-03-31 | Discharge: 2011-03-31 | Disposition: A | Discharge status: Home / Self Care | Payer: 59 | Source: Ambulatory Visit | Attending: Oncology | Admitting: Oncology

## 2011-03-31 DIAGNOSIS — M25559 Pain in unspecified hip: Secondary | ICD-10-CM | POA: Insufficient documentation

## 2011-03-31 DIAGNOSIS — R079 Chest pain, unspecified: Secondary | ICD-10-CM | POA: Insufficient documentation

## 2011-03-31 DIAGNOSIS — M47817 Spondylosis without myelopathy or radiculopathy, lumbosacral region: Secondary | ICD-10-CM | POA: Insufficient documentation

## 2011-03-31 DIAGNOSIS — C61 Malignant neoplasm of prostate: Secondary | ICD-10-CM | POA: Insufficient documentation

## 2011-03-31 DIAGNOSIS — M79609 Pain in unspecified limb: Secondary | ICD-10-CM | POA: Insufficient documentation

## 2011-03-31 MED ORDER — TECHNETIUM TC 99M MEDRONATE IV KIT
23.4000 | PACK | Freq: Once | INTRAVENOUS | Status: AC | PRN
  Administered 2011-03-31: 23.4 via INTRAVENOUS

## 2011-04-03 NOTE — Discharge Summary (Signed)
[name], [name]              ACCOUNT NO.:  [id number]  MEDICAL RECORD NO.:  [id number]  LOCATION:  2029                         FACILITY:  [facility]  PHYSICIAN:  Conley Canal, MD      DATE OF BIRTH:  1945-08-17  DATE OF ADMISSION:  03/30/2011 DATE OF DISCHARGE:  03/30/2011                        DISCHARGE SUMMARY - REFERRING   PRIMARY CARE PHYSICIAN:  Dr. Merri Brunette.  DISCHARGE DIAGNOSES: 1. Right-sided leg swelling probably from muscle sprain, deep vein     thrombosis, pulmonary embolism ruled out. 2. Malignant hypertension, gastroesophageal reflux disease, history of     prostate cancer, status post prostatectomy, history of deep vein     thrombosis, pulmonary embolism post surgery. 3. Hyperlipidemia, hiatal hernia, obstructive sleep apnea. 4. 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  DISCHARGE MEDICATIONS: 1. Percocet 5/325 mg 2 tablets every 4 hourly p.r.n. 20 tablets given. 2. Afrin OTC 1-2 sprays daily as needed. 3. Alprazolam 0.5 mg 3 times daily as needed.  No new prescription     given. 4. Artificial tears 1-2 drops in both eyes daily as needed. 5. Aspirin 325 mg daily. 6. Crestor 20 mg daily. 7. CoQ10 OTC 1 capsule daily. 8. Fish oil OTC 1 capsule daily. 9. Flonase nasal spray 2 sprays daily as needed. 10.Hydrochlorothiazide 25 mg daily. 11.Toprol XL 150 mg daily. 12.Multivitamins 1 tablet daily. 13.Prilosec 20 mg daily. 14.Ramipril 10 mg daily. 15.Sertraline 150 mg daily. 16.Vitamin B complex 1 tablet daily.  PROCEDURES PERFORMED: 1. CT of the chest with contrast showed no evidence of pulmonary     embolus. 2. Right leg ultrasound showed no evidence of DVT or Baker cyst.  HOSPITAL COURSE:  This pleasant 65 year old gentleman was admitted today with complaints of transient chest pain and right lower extremity pain with concern for DVT, PE as he had  DVT, PE in 2010 and completed 6 months of treatment.  At that time, the patient developed DVT after prostatectomy.  Upon admission, the patient had CT of the chest as well as ultrasound of the right leg which showed no evidence of DVT or PE. The patient is a bicycle rider and believes he may have sprained a muscle of the same leg and he acknowledges that the leg swelling and pain has improved in the past 1-1/2 weeks.  I think it was reasonable to consider DVT and PE.  If he continues to have problems, he would need imaging of the right lower extremity and possibly the abdomen to rule out any obstructive causes.  He did not have any swollen lymph nodes of the groin today.  Otherwise, labs including cardiac enzymes, thyroid function, lipid panel were all unremarkable.  He is discharged to follow with Dr. Merri Brunette in the next 1 week to ensure resolution of the leg swelling.  Meanwhile, he should take nonsteroidal anti-inflammatory drugs; and as I mentioned earlier on, he will need further evaluation if no complete resolution.  Time spent for discharge preparation less than 30 minutes.     Conley Canal, MD     SR/MEDQ  D:  03/30/2011  T:  03/30/2011  Job:  [id number]  cc:   Soyla Murphy. Renne Crigler, M.D. Deanna Artis. Sharene Skeans, M.D. Vesta Mixer, M.D. Valetta Fuller, MD  Electronically Signed by Conley Canal  on 04/03/2011 01:34:40 PM

## 2011-04-09 NOTE — Consult Note (Signed)
[name], [name] NO.:  [id number]  MEDICAL RECORD NO.:  [id number]  LOCATION:                                 FACILITY:  PHYSICIAN:  Thana Farr, MD    DATE OF BIRTH:  May 27, 1946  DATE OF CONSULTATION:  03/06/2011 DATE OF DISCHARGE:                                CONSULTATION   REASON FOR CONSULTATION:  Left-sided weakness.  HISTORY OF PRESENT ILLNESS:  This is a 65 year old married white male in his usual state of health until yesterday.  Around 1:15, he complained of left-sided weakness, although no loss of function.  No numbness.  No slurred speech, vision changes, or headaches.  Symptoms lasted approximately 1-1/2-2 hours.  They resolved in the emergency room.  He has been followed by the TIA protocol since.  PAST MEDICAL HISTORY:  Significant for: 1. Hypertension. 2. Hyperlipidemia. 3. GERD without history of bleeding. 4. Obesity. 5. Anxiety and depression. 6. History of prostate cancer. 7. Erectile dysfunction.  STROKE RISK FACTORS:  Include obesity, hyperlipidemia, hypertension.  MEDICATIONS: 1. Aspirin 81 mg a day. 2. Hydrochlorothiazide 25 mg. 3. Toprol-XL 100 mg 1/2 tablet daily. 4. Omeprazole 20 mg. 5. Altace 10 mg. 6. Crestor 10 mg a day (on hold for the last 2 weeks due to myalgias     without much improvement). 7. Zoloft 100 mg a day. 8. Viagra 50 mg as directed as needed. 9. Oxycodone 5/325 as needed for pain. 10.Alprazolam 0.5 mg as need for anxiety.  ALLERGIES:  PENICILLIN.  FAMILY HISTORY:  Father is deceased at age 9, had four strokes, first occurring at age 74.  Mother is alive at age 56 with two TIAs, first occurring at age 72.  SOCIAL HISTORY:  The patient smokes one cigar a day and drinks 8 ounces of wine each night.  No illicit drug use.  He is married, father of two, grandfather of two, works as an Acupuncturist.  REVIEW OF SYSTEMS:  The patient has occasional shortness of breath, which is  unrelated to activity.  He denies chest pain or palpitations. He has GERD, but no nausea, vomiting, diarrhea, or constipation.  He has myalgias diffusely and multijoint pain.  He denies any history of diabetes.  PHYSICAL EXAMINATION:  VITAL SIGNS:  Temperature is 98.4, blood pressure 155/92, pulse 64, respirations 16, SaO2 98% on room air. NEUROLOGIC:  The patient is alert and oriented x3, follows multi-step commands, speech is fluent with good insight.  PERRL.  EOMI.  Visual fields intact with conjugate gaze.  Smile symmetric.  Tongue is midline. Shoulder shrug is intact.  Strength is 5/5 x4 extremities.  No drift. DTRs are 2+ in the upper extremities, 2+ in the patellas and Achilles with plantars downgoing bilaterally.  Sensory exam to light touch is intact diffusely.  Test results shows white blood cells 6.3, hemoglobin 14.8, and platelets 183.  UA is negative.  INR 0.93.  Two-D echo on March 06, 2011 showed an EF of 55% with no wall motion abnormality, mild diastolic dysfunction. No valvular abnormality.  Carotid revealed no ICA stenosis, antegrade flow on initial read.  His EKG shows normal sinus rhythm at 67 beats per minute.  Head CT was nonacute and mild small vessel disease.  MRI, MRA showed no acute ischemia, bleed, or mass.  Mild small vessel disease. No significant large vessel stenoses.  ASSESSMENT AND PLAN:  This is a 65 year old married white male with hyperlipidemia, hypertension, obesity, and tobacco abuse, who presented with transient left-sided sensory weakness, which is now resolved.  Head CT, MRI, MRA showed no acute ischemia.  Small vessel disease is somewhat concerning given his age.  Recommend aggressive risk factor modification and increasing aspirin to 325 mg a day.  Would also recommend following up with Neurology as an outpatient in approximately 1 month.  Thank you for allowing [place] to see this patient.     Luan Moore,  P.A.   ______________________________ Thana Farr, MD    TCJ/MEDQ  D:  03/06/2011  T:  03/07/2011  Job:  [id number]  cc:   Dr. Thad Ranger  Electronically Signed by Delice Bison JERNEJCIC P.A. on 04/09/2011 07:37:16 AM Electronically Signed by Thana Farr MD on 04/09/2011 11:06:13 AM

## 2011-05-20 LAB — CBC
HCT: 42 % (ref 39.0–52.0)
HCT: 44.6 % (ref 39.0–52.0)
Hemoglobin: 14.2 g/dL (ref 13.0–17.0)
Hemoglobin: 15.1 g/dL (ref 13.0–17.0)
Platelets: 178 10*3/uL (ref 150–400)
RBC: 4.96 MIL/uL (ref 4.22–5.81)
RDW: 13.2 % (ref 11.5–15.5)
WBC: 12.2 10*3/uL — ABNORMAL HIGH (ref 4.0–10.5)
WBC: 5.8 10*3/uL (ref 4.0–10.5)

## 2011-05-20 LAB — BASIC METABOLIC PANEL
BUN: 10 mg/dL (ref 6–23)
GFR calc Af Amer: >60 mL/min (ref >60)
GFR calc non Af Amer: >60 mL/min (ref >60)
Potassium: 5 mEq/L (ref 3.5–5.1)
Sodium: 139 mEq/L (ref 135–145)

## 2011-05-20 LAB — URINALYSIS, ROUTINE W REFLEX MICROSCOPIC
Bilirubin Urine: NEGATIVE
Glucose, UA: NEGATIVE mg/dL
Nitrite: POSITIVE — AB
Specific Gravity, Urine: 1.01 (ref 1.005–1.030)
pH: 5.5 (ref 5.0–8.0)

## 2011-05-20 LAB — POCT I-STAT, CHEM 8
BUN: 14 mg/dL (ref 6–23)
Calcium, Ion: 1.13 mmol/L (ref 1.12–1.32)
Creatinine, Ser: 1.1 mg/dL (ref 0.4–1.5)
TCO2: 26 mmol/L (ref 0–100)

## 2011-05-20 LAB — URINE MICROSCOPIC-ADD ON

## 2011-05-20 LAB — URINE CULTURE

## 2011-05-20 LAB — DIFFERENTIAL
Basophils Absolute: 0.1 10*3/uL (ref 0.0–0.1)
Eosinophils Absolute: 0 10*3/uL (ref 0.0–0.7)
Eosinophils Relative: 0 % (ref 0–5)
Lymphocytes Relative: 9 % — ABNORMAL LOW (ref 12–46)
Monocytes Absolute: 0.5 10*3/uL (ref 0.1–1.0)

## 2011-05-26 LAB — CBC
MCHC: 33.5
MCV: 89.2
Platelets: 174

## 2011-05-26 LAB — DIFFERENTIAL
Eosinophils Relative: 2
Lymphocytes Relative: 28
Lymphs Abs: 1.7
Monocytes Absolute: 0.4

## 2011-05-26 LAB — COMPREHENSIVE METABOLIC PANEL
AST: 30
Albumin: 3.9
Calcium: 9.8
Creatinine, Ser: 1.06
GFR calc Af Amer: >60
Total Protein: 6.4

## 2011-05-28 NOTE — H&P (Signed)
[name], [name]              ACCOUNT NO.:  [id number]  MEDICAL RECORD NO.:  [id number]  LOCATION:  2015                         FACILITY:  [facility]  PHYSICIAN:  Lonia Blood, M.D.      DATE OF BIRTH:  23-Sep-1945  DATE OF ADMISSION:  03/30/2011 DATE OF DISCHARGE:                             HISTORY & PHYSICAL   PRIMARY CARE PHYSICIAN:  Soyla Murphy. Renne Crigler, M.D.  UROLOGIST:  Valetta Fuller, MD.  NEUROLOGIST:  Deanna Artis. Sharene Skeans, M.D.  CARDIOLOGIST:  Vesta Mixer, M.D.  PRESENTING COMPLAINT:  Right lower extremity pain and transient chest pain.  HISTORY OF PRESENT ILLNESS:  The patient is a 65 year old gentleman with multiple medical problems including history of DVT and PE in March 2010 after which he was on Coumadin for 6 months.  He has been off Coumadin since then.  Also history of GERD, hypertension, hyperlipidemia, and prostate cancer, among other things.  [facility] today secondary to pain on the leg.  This has been going on for over 2 days and is worrisome.  It is located around the calf region, also all the way to his knee.  His last blood clot was after prostatectomy and was hence presumed to be treated.  He was seen by his primary care physician who is being scheduled for "a bone scan."  He started having edema in the right lower extremity.  His wife got worried and convinced him to come to the hospital.  On arrival, the patient reported feeling a change of chest discomfort on the left side which was transient, but has since passed.  He is not feeling that at the moment. He denies shortness of breath.  No cough.  No nausea, vomiting, or diarrhea.  No involvement of the left lower extremity.  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  ALLERGIES:  CEPHALOSPORIN, ASPIRIN, and PENICILLIN.  CURRENT MEDICATIONS: 1. Xanax 0.5 mg three times a day. 2. Hypotonic nasal wash sinus rinse. 3. Multivitamin tablet one tablet daily. 4. Aspirin 325 mg daily.5. B complex tablet one daily. 6. Co-enzyme Q as needed. 7. Fish oil with omega-3 fatty acid 1000 mg capsule daily. 8. Hydrochlorothiazide 25 mg daily. 9. Ketoconazole cream 2% as needed. 10.AnaMantle cream as needed. 11.Methylcellulose artificial tears 1% solution. 12.Metoprolol XL 100 mg daily. 13.Naproxen 220 mg daily. 14.Omeprazole 20 mg capsule  daily. 15.Oxycodone and acetaminophen 5/325 as needed. 16.Oxymetazoline nasal spray. 17.FiberCon 625 mg tablet. 18.Ramipril 10 mg daily. 19.Rosuvastatin 10 mg daily. 20.Sertraline 100 mg daily. 21.Viagra 50 mg as needed.  SOCIAL HISTORY:  The patient is married.  He lives with his wife here. They live in [place].  He smokes cigar, but no cigarette, up to 3 cigars a day.  He quit smoking more than 20 years ago.  Prior to that, he was about a pack per day.  Occasional alcohol, mainly social.  No IV drug use,  although remotely has used marijuana.  FAMILY HISTORY:  Mother is about 34.  Father is about 74.  Father had strokes and dementia.  Mother has peripheral vascular disease, atherosclerosis, and atrial fibrillation.  His twin brother has benign thyroid cyst and normal factor liver disease on chronic prostatitis as well as hypertension, has two other healthy offsprings.  REVIEW OF SYSTEMS:  All systems reviewed are negative except for HPI.  PHYSICAL EXAMINATION:  VITAL SIGNS:  He is afebrile, weighing 204 with a height of 5 feet 11 inches, temperature 98.3, blood pressure 119/73 with pulse 78, respiratory rate is 16.  He sats 98% on room air. GENERAL:  He is awake, alert, oriented, very pleasant man, who is in no acute distress. HEENT:  PERRLA.  EOMI.  No pallor.  No jaundice.  No rhinorrhea. NECK:  Supple.  No visible JVD.  No lymphadenopathy. RESPIRATORY:  He has good air entry bilaterally.  No wheezes.  No rales. No crackles. CARDIOVASCULAR:  He has S1 and S2.  No murmur. ABDOMEN:  Soft, nontender, positive bowel sounds. EXTREMITIES:  No edema, cyanosis, or clubbing.  His right lower extremity shows some mild erythema around the knee and going posteriorly towards the calf.  No calf tenderness palpated.  There is slight edema with about 1+ on that side. MUSCULOSKELETAL:  No other joint swelling or tenderness.  LABORATORY DATA:  His white count is 5.8, hemoglobin 13.7, platelet 159 with normal differential D-dimer is 0.70 which is minimally elevated. Sodium of 139, potassium 3.3, chloride 100, CO2 of 28, glucose 89, BUN 11, creatinine 0.90 with calcium 9.2.  Initial cardiac enzymes showed a CK of 229 with an MB of 7.3 and index 3.2.  CT angiogram of the chest showed no evidence of significant PE.  There is actuating ascending thoracic aorta, coronary artery calcification, and dependent changes in the posterior lungs.  ASSESSMENT:  This is a 65 year old man with multiple medical  problems here with right lower extremity pain, mild swelling, as well as transient chest pain.  With the patient's past history of deep vein thrombosis and peripheral edema, we were more concerned about possibility of recurrent deep vein thrombosis at this point.  PLAN: 1. Right lower extremity pain.  Admit the patient, empirically start     him on full-dose Lovenox, get right lower extremity Doppler     ultrasound in the morning to rule out DVT.  The chest CT has ruled     out PE at this point. 2. Chest pain, cause not very clear.  We will cycle enzymes, continue     with tele, aspirin, and pain control.  He is no longer in pain at     this point. 3. Hypertension.  Continue his home medications. 4. GERD.  Continue PPI. 5. Hypokalemia.  We will replete potassium.  Check magnesium level. 6. Obstructive sleep apnea.  Continue CPAP at night, hyperlipidemia.     Check fasting lipid panel  and continue his home medication.  All     other chronic medical problems seems to be stable.     Lonia Blood, M.D.     Verlin Grills  D:  03/30/2011  T:  03/30/2011  Job:  [id number]  Electronically Signed by Lonia Blood M.D. on 05/28/2011 02:53:15 PM

## 2011-06-05 ENCOUNTER — Other Ambulatory Visit: Payer: Self-pay | Admitting: Gastroenterology

## 2011-06-22 ENCOUNTER — Encounter: Payer: Self-pay | Admitting: Cardiovascular Disease

## 2011-06-22 ENCOUNTER — Ambulatory Visit (INDEPENDENT_AMBULATORY_CARE_PROVIDER_SITE_OTHER): Payer: 59 | Admitting: Cardiovascular Disease

## 2011-06-22 ENCOUNTER — Other Ambulatory Visit: Payer: 59 | Admitting: *Deleted

## 2011-06-22 DIAGNOSIS — I4949 Other premature depolarization: Secondary | ICD-10-CM

## 2011-06-22 DIAGNOSIS — I493 Ventricular premature depolarization: Secondary | ICD-10-CM

## 2011-06-22 DIAGNOSIS — I10 Essential (primary) hypertension: Secondary | ICD-10-CM

## 2011-06-22 DIAGNOSIS — R079 Chest pain, unspecified: Secondary | ICD-10-CM | POA: Insufficient documentation

## 2011-06-22 LAB — BASIC METABOLIC PANEL
BUN: 18 mg/dL (ref 6–23)
Chloride: 101 mEq/L (ref 96–112)
Potassium: 4 mEq/L (ref 3.5–5.1)

## 2011-06-22 LAB — HEPATIC FUNCTION PANEL
ALT: 38 U/L (ref 0–53)
AST: 32 U/L (ref 0–37)
Albumin: 4.5 g/dL (ref 3.5–5.2)
Total Bilirubin: 0.9 mg/dL (ref 0.3–1.2)

## 2011-06-22 LAB — LIPID PANEL
Cholesterol: 161 mg/dL (ref 0–200)
LDL Cholesterol: 94 mg/dL (ref 0–99)

## 2011-06-22 NOTE — Assessment & Plan Note (Addendum)
He continues to have episodes of chest pain. Occasional date radiated to the right side of his neck and jaw. He  He's had a history of syncope.  We'll schedule him for a stress Myoview study. His last stress test was in March of 2011.

## 2011-06-22 NOTE — Progress Notes (Signed)
[name] Date of Birth  September 22, 1945 [facility] [address]    [address] [place], [place]  [address] [phone]  Fax  [phone]  History of Present Illness:  [name] is a 65 y.o. gentleman with a history of hypertension, prostate cancer-status post prostate surgery, deep vein thrombosis secondary to prostate surgery, and history of chest pain.  He has frequent premature ventricular contractions.  He  has been hospitalized twice since I last saw him. He had an episode of leg swelling. Workup of that wasn't revealing. He also had some dizziness.  Neuro consult was unremarkable. He was found to have any significant arrhythmias.  His last nuclear stress test was 11/04/2009. It revealed no evidence of ischemia. His left ventricular systolic function was normal with an ejection fraction of 60%.   Current Outpatient Prescriptions on File Prior to Visit  Medication Sig Dispense Refill  . ALPRAZolam (XANAX) 0.5 MG tablet Take 0.5 mg by mouth 2 (two) times daily.        Marland Kitchen b complex vitamins tablet Take 1 tablet by mouth daily.        Marland Kitchen co-enzyme Q-10 30 MG capsule Take 30 mg by mouth daily.        . fish oil-omega-3 fatty acids 1000 MG capsule Take 1 g by mouth daily.       . hydrochlorothiazide 25 MG tablet Take 25 mg by mouth every other day.       . Hypertonic Nasal Wash (SINUS RINSE BOTTLE KIT) PACK Place into the nose at bedtime.        Marland Kitchen ketoconazole (NIZORAL) 2 % cream Apply topically as needed. Nail fungus       . lidocaine-hydrocortisone (ANAMANTEL HC) 3-0.5 % CREA Apply topically as needed.       . methylcellulose (ARTIFICIAL TEARS) 1 % ophthalmic solution Place 2 drops into both eyes daily.        . metoprolol (TOPROL-XL) 100 MG 24 hr tablet Take 150 mg by mouth daily.        . Multiple Vitamin (MULTIVITAMIN) tablet Take 1 tablet by mouth daily.        . naproxen sodium (ANAPROX) 220 MG tablet Take 440 mg by mouth as needed. Joint pain       . NON FORMULARY CPAP at  hs       . omeprazole (PRILOSEC) 20 MG capsule Take 20 mg by mouth daily.        Marland Kitchen oxyCODONE-acetaminophen (PERCOCET) 5-325 MG per tablet Take 1 tablet by mouth as needed. pain      . oxymetazoline (AFRIN) 0.05 % nasal spray Place 2 sprays into the nose 2 (two) times daily.        . ramipril (ALTACE) 10 MG tablet Take 10 mg by mouth 2 (two) times daily.       . rosuvastatin (CRESTOR) 10 MG tablet Take 5 mg by mouth daily.       . sertraline (ZOLOFT) 100 MG tablet Take 100 mg by mouth daily.        . sildenafil (VIAGRA) 50 MG tablet Take 50 mg by mouth daily as needed.          Allergies  Allergen Reactions  . Aspirin     REACTION: gi upset with higher dose  . Iohexol      Code: HIVES, Desc: Mr. Wild explained to me on 10/25/08 that someone misunderstood him when he spoke about his hives reaction to IV dye.Mr. Broxterman stated  that he only had a reaction to MRI IV dye, not CT dye. Mr. Smisek also explained that he described his "feeling" he had, Onset Date: [id number]   Code: HIVES, Desc: (continued),,"feeling" he had was consistent with what I described as the "normal" feeling most people have during CT IV dye administration. Mr. Salazar stated he was fine and that the "breath"issue was his anxiousness and not actual dyspnea., Onset Date: [id number]   . Penicillins Itching    Past Medical History  Diagnosis Date  . HTN (hypertension)   . GERD (gastroesophageal reflux disease)   . Prostate cancer   . DVT (deep venous thrombosis)     secondary to prostate surgery  . Chest pain   . Aortic dilatation   . Pulmonary embolism 2011  . History of echocardiogram 10/30/2009    EF 55-60%  . Tricuspid regurgitation 02/08/2008    trace  . Mitral regurgitation 10/30/2009    trace  . Apnea, sleep     uses cpap at hs  . Hyperlipidemia   . Hiatal hernia     Past Surgical History  Procedure Date  . Prostate surgery   . Tonsillectomy   . Appendectomy   . Eye surgery   . Bladder surgery   .  Cholecystectomy     History  Smoking status  . Current Everyday Smoker  . Types: Cigars  Smokeless tobacco  . Not on file  Comment: 5 cigars /week    History  Alcohol Use  . 0.0 oz/week    2 glasses wine/day    Family History  Problem Relation Age of Onset  . Hypertension Mother   . Hypertension Father   . Stroke Father   . Diabetes Maternal Grandmother   . Colon polyps Brother   . Colon polyps Father   . Colitis Brother     Twin brother/Crohn's  . Heart disease Maternal Grandmother   . Cirrhosis      [employer]    Reviw of Systems:  Reviewed in the HPI.  All other systems are negative.  Physical Exam: BP 132/83  Pulse 75  Ht 5\' 11"  (1.803 m)  Wt 216 lb 12.8 oz (98.34 kg)  BMI 30.24 kg/m2 The patient is alert and oriented x 3.  The mood and affect are normal.   Skin: warm and dry.  Color is normal.    HEENT:   Normocephalic/atraumatic. His mucous membranes are moist. No JVD. His carotids are normal   Lungs: Clear to auscultation  Heart: Regular rate.  no murmurs.    Abdomen: +BS, no HSM, non tender  Extremities:  No c/c/e, no palpable cords  Neuro:  Non focal,gait is normal    Assessment / Plan:

## 2011-06-22 NOTE — Patient Instructions (Addendum)
Your physician wants you to follow-up in: 6 months  You will receive a reminder letter in the mail two months in advance. If you don't receive a letter, please call our office to schedule the follow-up appointment.  Your physician recommends that you return for a FASTING lipid profile: 6 months and TODAY   Your physician has requested that you have en exercise stress myoview. For further information please visit [email or web address] Please follow instruction sheet, as given.  Your physician has recommended that you wear an event monitor. Event monitors are medical devices that record the heart's electrical activity. Doctors most often [place] these monitors to diagnose arrhythmias. Arrhythmias are problems with the speed or rhythm of the heartbeat. The monitor is a small, portable device. You can wear one while you do your normal daily activities. This is usually used to diagnose what is causing palpitations/syncope (passing out).

## 2011-06-22 NOTE — Assessment & Plan Note (Signed)
His blood pressure has been well-controlled. We will continue with her current medications.

## 2011-06-22 NOTE — Assessment & Plan Note (Signed)
He's had a history of premature ventricular contractions in the past. He now states that his palpitations are definitely worsened. These episodes last for as much as 10 seconds. They did they do not occur every day.  He causes some uncomfortable sensation in his chest and in his throat. There is no syncope. He has no shortness of breath or chest pain with them.  We will place a 30 day event monitor on him.

## 2011-06-23 ENCOUNTER — Ambulatory Visit (INDEPENDENT_AMBULATORY_CARE_PROVIDER_SITE_OTHER): Payer: 59 | Admitting: Gastroenterology

## 2011-06-23 ENCOUNTER — Encounter: Payer: Self-pay | Admitting: Gastroenterology

## 2011-06-23 VITALS — BP 110/70 | HR 60 | Ht 71.0 in | Wt 218.0 lb

## 2011-06-23 DIAGNOSIS — Z8719 Personal history of other diseases of the digestive system: Secondary | ICD-10-CM

## 2011-06-23 DIAGNOSIS — R1314 Dysphagia, pharyngoesophageal phase: Secondary | ICD-10-CM

## 2011-06-23 DIAGNOSIS — K219 Gastro-esophageal reflux disease without esophagitis: Secondary | ICD-10-CM

## 2011-06-23 DIAGNOSIS — K589 Irritable bowel syndrome without diarrhea: Secondary | ICD-10-CM | POA: Insufficient documentation

## 2011-06-23 DIAGNOSIS — R1310 Dysphagia, unspecified: Secondary | ICD-10-CM

## 2011-06-23 NOTE — Patient Instructions (Addendum)
You have been given a separate informational sheet regarding your tobacco use, the importance of quitting and local resources to help you quit. You have been scheduled for a Endoscopy, instructions have been provided. You have been given samples of Dexilant to take once daily 20-30 minutes before breakfast.   Gastroesophageal reflux disease (GERD) happens when acid from your stomach flows up into the esophagus. When acid comes in contact with the esophagus, the acid causes soreness (inflammation) in the esophagus. Over time, GERD may create small holes (ulcers) in the lining of the esophagus. CAUSES   Increased body weight. This puts pressure on the stomach, making acid rise from the stomach into the esophagus.   Smoking. This increases acid production in the stomach.   Drinking alcohol. This causes decreased pressure in the lower esophageal sphincter (valve or ring of muscle between the esophagus and stomach), allowing acid from the stomach into the esophagus.   Late evening meals and a full stomach. This increases pressure and acid production in the stomach.   A malformed lower esophageal sphincter.  Sometimes, no cause is found. SYMPTOMS   Burning pain in the lower part of the mid-chest behind the breastbone and in the mid-stomach area. This may occur twice a week or more often.   Trouble swallowing.   Sore throat.   Dry cough.   Asthma-like symptoms including chest tightness, shortness of breath, or wheezing.  DIAGNOSIS  Your caregiver may be able to diagnose GERD based on your symptoms. In some cases, X-rays and other tests may be done to check for complications or to check the condition of your stomach and esophagus. TREATMENT  Your caregiver may recommend over-the-counter or prescription medicines to help decrease acid production. Ask your caregiver before starting or adding any new medicines.  HOME CARE INSTRUCTIONS   Change the factors that you can control. Ask your  caregiver for guidance concerning weight loss, quitting smoking, and alcohol consumption.   Avoid foods and drinks that make your symptoms worse, such as:   Caffeine or alcoholic drinks.   Chocolate.   Peppermint or mint flavorings.   Garlic and onions.   Spicy foods.   Citrus fruits, such as oranges, lemons, or limes.   Tomato-based foods such as sauce, chili, salsa, and pizza.   Fried and fatty foods.   Avoid lying down for the 3 hours prior to your bedtime or prior to taking a nap.   Eat small, frequent meals instead of large meals.   Wear loose-fitting clothing. Do not wear anything tight around your waist that causes pressure on your stomach.   Raise the head of your bed 6 to 8 inches with wood blocks to help you sleep. Extra pillows will not help.   Only take over-the-counter or prescription medicines for pain, discomfort, or fever as directed by your caregiver.   Do not take aspirin, ibuprofen, or other nonsteroidal anti-inflammatory drugs (NSAIDs).  SEEK IMMEDIATE MEDICAL CARE IF:   You have pain in your arms, neck, jaw, teeth, or back.   Your pain increases or changes in intensity or duration.   You develop nausea, vomiting, or sweating (diaphoresis).   You develop shortness of breath, or you faint.   Your vomit is green, yellow, black, or looks like coffee grounds or blood.   Your stool is red, bloody, or black.  These symptoms could be signs of other problems, such as heart disease, gastric bleeding, or esophageal bleeding. MAKE SURE YOU:   Understand these instructions.  Will watch your condition.   Will get help right away if you are not doing well or get worse.  Document Released: 05/11/2005 Document Revised: 04/13/2011 Document Reviewed: 02/18/2011 ExitCare Patient Information 2012 [employer]. 

## 2011-06-23 NOTE — Progress Notes (Signed)
This is a 65 year old Caucasian male with chronic acid reflux and constipation predominant IBS. He is having worsening burning substernal chest pain with occasional solid food dysphagia. He also has an atypical right-sided chest pain radiating into his jaw not related to exercise or other cardiovascular or pulmonary symptoms. He has a planned exercise stress test with Dr. Melburn Popper. He denies any hepatobiliary or lower gastrointestinal complaints. Patient is on Prilosec 20 mg a day and when necessary Naprosyn for arthritis. Last endoscopic exam was several years ago. Review of his chart also shows evidence of lactose intolerance, recurrent hemorrhoids, and chronic cholelithiasis. He was referred to surgery in 2006, but I do not think he had cholecystectomy.  Current Medications, Allergies, Past Medical History, Past Surgical History, Family History and Social History were reviewed in [employer] record.  Pertinent Review of Systems Negative... recent labs reviewed and all normal including CBC and metabolic profile.  Physical Exam: Cannot appreciate stigmata of chronic liver disease. His chest is clear he appears to be in a regular rhythm without murmurs gallops or rubs. There is no hepatosplenomegaly, abdominal masses or tenderness. Bowel sounds are normal. Peripheral extremities are unremarkable mental status is normal.   Assessment and Plan: Chronic GERD with probable peptic stricture of his esophagus. His symptomatology does not seem consistent with symptomatic cholelithiasis. I've scheduled him for followup endoscopy and probable esophageal dilatation. I discontinued his Prilosec and started Dexilant 60 mg 30 minutes before breakfast. I versed and continue his cardiology followup as scheduled. The patient is up-to-date on his colonoscopy exams. We also need to exclude H. pylori infection and possible gut damage from NSAIDs. Encounter Diagnosis  Name Primary?  . Reflux Yes

## 2011-06-27 ENCOUNTER — Encounter: Payer: Self-pay | Admitting: Gastroenterology

## 2011-06-27 ENCOUNTER — Other Ambulatory Visit: Payer: Self-pay | Admitting: Gastroenterology

## 2011-06-27 ENCOUNTER — Ambulatory Visit (AMBULATORY_SURGERY_CENTER): Payer: 59 | Admitting: Gastroenterology

## 2011-06-27 DIAGNOSIS — K219 Gastro-esophageal reflux disease without esophagitis: Secondary | ICD-10-CM

## 2011-06-27 DIAGNOSIS — R1310 Dysphagia, unspecified: Secondary | ICD-10-CM

## 2011-06-27 DIAGNOSIS — R1314 Dysphagia, pharyngoesophageal phase: Secondary | ICD-10-CM

## 2011-06-27 DIAGNOSIS — K222 Esophageal obstruction: Secondary | ICD-10-CM | POA: Insufficient documentation

## 2011-06-27 MED ORDER — SODIUM CHLORIDE 0.9 % IV SOLN: 500.0000 mL | INTRAVENOUS | Status: DC

## 2011-06-27 MED ORDER — DEXLANSOPRAZOLE 60 MG PO CPDR: 60.0000 mg | DELAYED_RELEASE_CAPSULE | Freq: Every day | ORAL | Status: DC

## 2011-06-27 NOTE — Patient Instructions (Signed)
PLEASE FOLLOW DISCHARGE INSTRUCTIONS GIVEN TODAY, SEE HANDOUTS. DILATION DIET TODAY-NOTHING BY MOUTH UNTIL 11:05 AM, THEN CLEAR LIQUIDS FOR 1 HOUR, THEN SOFT DIET REST OF TODAY. RESUME REGULAR DIET TOMORROW.  RESUME CURRENT MEDICATIONS TODAY. CALL us WITH ANY QUESTIONS OR CONCERNS. WE WILL CALL YOU TOMORROW TO CHECK ON YOU. THANK YOU!

## 2011-06-28 ENCOUNTER — Telehealth: Payer: Self-pay | Admitting: *Deleted

## 2011-06-28 NOTE — Telephone Encounter (Signed)

## 2011-06-30 ENCOUNTER — Ambulatory Visit (HOSPITAL_COMMUNITY): Payer: 59 | Attending: Cardiovascular Disease | Admitting: Radiology

## 2011-06-30 ENCOUNTER — Encounter (INDEPENDENT_AMBULATORY_CARE_PROVIDER_SITE_OTHER): Payer: 59

## 2011-06-30 DIAGNOSIS — I493 Ventricular premature depolarization: Secondary | ICD-10-CM

## 2011-06-30 DIAGNOSIS — R002 Palpitations: Secondary | ICD-10-CM | POA: Insufficient documentation

## 2011-06-30 DIAGNOSIS — Z8249 Family history of ischemic heart disease and other diseases of the circulatory system: Secondary | ICD-10-CM | POA: Insufficient documentation

## 2011-06-30 DIAGNOSIS — R0789 Other chest pain: Secondary | ICD-10-CM | POA: Insufficient documentation

## 2011-06-30 DIAGNOSIS — I10 Essential (primary) hypertension: Secondary | ICD-10-CM | POA: Insufficient documentation

## 2011-06-30 DIAGNOSIS — R079 Chest pain, unspecified: Secondary | ICD-10-CM

## 2011-06-30 DIAGNOSIS — F17200 Nicotine dependence, unspecified, uncomplicated: Secondary | ICD-10-CM | POA: Insufficient documentation

## 2011-06-30 DIAGNOSIS — E785 Hyperlipidemia, unspecified: Secondary | ICD-10-CM | POA: Insufficient documentation

## 2011-06-30 MED ORDER — TECHNETIUM TC 99M TETROFOSMIN IV KIT
11.0000 | PACK | Freq: Once | INTRAVENOUS | Status: AC | PRN
  Administered 2011-06-30: 11 via INTRAVENOUS

## 2011-06-30 MED ORDER — TECHNETIUM TC 99M TETROFOSMIN IV KIT
33.0000 | PACK | Freq: Once | INTRAVENOUS | Status: AC | PRN
  Administered 2011-06-30: 33 via INTRAVENOUS

## 2011-06-30 NOTE — Progress Notes (Signed)
[facility] SITE 3 NUCLEAR MED [address] [place] [address] [phone]  Cardiology Nuclear Med Study  TRACKER MANCE is a 65 y.o. male [id number] 1946-03-26   Nuclear Med Background Indication for Stress Test:  Evaluation for Ischemia History: 07/12 Echo: EF 55-60%, 03/11 Myocardial Perfusion Study: EF 60% NL and PE/DVT Cardiac Risk Factors: Family History - CAD, Hypertension, Lipids and Smoker  Symptoms:  Chest Pain, Dizziness and Palpitations   Nuclear Pre-Procedure Caffeine/Decaff Intake:  None NPO After: 8:00pm   Lungs: clear IV 0.9% NS with Angio Cath:  20g  IV Site: R Antecubital  IV Started by:  Stanton Kidney, EMT-P  Chest Size (in):  44 Cup Size: n/a  Height: 5\' 11"  (1.803 m)  Weight:  218 lb (98.884 kg)  BMI:  Body mass index is 30.40 kg/(m^2). Tech Comments:  Metoprolol held > 24 hours, per patient    Nuclear Med Study 1 or 2 day study: 1 day  Stress Test Type:  Stress  Reading MD: Charlton Haws, MD  Order Authorizing Provider:  P.Nahser  Resting Radionuclide: Technetium 85m Tetrofosmin  Resting Radionuclide Dose: 11 mCi   Stress Radionuclide:  Technetium 31m Tetrofosmin  Stress Radionuclide Dose: 33 mCi           Stress Protocol Rest HR: 70 Stress HR: 136  Rest BP: 149/86 Stress BP: 205/98  Exercise Time (min): 9:31 METS: 10.9   Predicted Max HR: 157 bpm % Max HR: 86.62 bpm Rate Pressure Product: 21308   Dose of Adenosine (mg):  n/a Dose of Lexiscan: n/a mg  Dose of Atropine (mg): n/a Dose of Dobutamine: n/a mcg/kg/min (at max HR)  Stress Test Technologist: Milana Na, EMT-P  Nuclear Technologist:  Domenic Polite, CNMT     Rest Procedure:  Myocardial perfusion imaging was performed at rest 45 minutes following the intravenous administration of Technetium 80m Tetrofosmin. Rest ECG: NSR  Stress Procedure:  The patient exercised for 9:31.  The patient stopped due to fatigue and denied any chest pain.  There were no  significant ST-T wave changes and rare pvcs.  Technetium 70m Tetrofosmin was injected at peak exercise and myocardial perfusion imaging was performed after a brief delay. Stress ECG: No significant change from baseline ECG  QPS Raw Data Images:  Normal; no motion artifact; normal heart/lung ratio. Stress Images:  Normal homogeneous uptake in all areas of the myocardium. Rest Images:  Normal homogeneous uptake in all areas of the myocardium. Subtraction (SDS):  Normal Transient Ischemic Dilatation (Normal <1.22):  1.01 Lung/Heart Ratio (Normal <0.45):  .31  Quantitative Gated Spect Images QGS EDV:  119 ml QGS ESV:  53 ml QGS cine images:  NL LV Function; NL Wall Motion QGS EF: 56%  Impression Exercise Capacity:  Good exercise capacity. BP Response:  Hypertensive blood pressure response. Clinical Symptoms:  No chest pain. ECG Impression:  No significant ST segment change suggestive of ischemia. Comparison with Prior Nuclear Study: No images to compare  Overall Impression:  Normal stress nuclear study. EF 56%    Charlton Haws

## 2011-07-30 ENCOUNTER — Other Ambulatory Visit: Payer: Self-pay | Admitting: Gastroenterology

## 2011-08-22 ENCOUNTER — Ambulatory Visit: Payer: 59 | Attending: Internal Medicine | Admitting: Physical Therapy

## 2011-08-22 DIAGNOSIS — M545 Low back pain, unspecified: Secondary | ICD-10-CM | POA: Insufficient documentation

## 2011-08-22 DIAGNOSIS — M2569 Stiffness of other specified joint, not elsewhere classified: Secondary | ICD-10-CM | POA: Insufficient documentation

## 2011-08-22 DIAGNOSIS — IMO0001 Reserved for inherently not codable concepts without codable children: Secondary | ICD-10-CM | POA: Insufficient documentation

## 2011-08-22 DIAGNOSIS — M25569 Pain in unspecified knee: Secondary | ICD-10-CM | POA: Insufficient documentation

## 2011-08-25 ENCOUNTER — Telehealth: Payer: Self-pay | Admitting: Oncology

## 2011-08-25 NOTE — Telephone Encounter (Signed)
spoke with pt re 11/07/11 appt and mailed card to pt   aom

## 2011-08-26 ENCOUNTER — Ambulatory Visit: Payer: 59 | Admitting: Physical Therapy

## 2011-08-29 ENCOUNTER — Ambulatory Visit: Payer: 59 | Admitting: Physical Therapy

## 2011-09-02 ENCOUNTER — Ambulatory Visit: Payer: 59 | Admitting: Physical Therapy

## 2011-10-11 ENCOUNTER — Other Ambulatory Visit: Payer: Self-pay | Admitting: *Deleted

## 2011-10-11 MED ORDER — RAMIPRIL 10 MG PO TABS: 10.0000 mg | ORAL_TABLET | Freq: Two times a day (BID) | ORAL | Status: DC

## 2011-10-17 ENCOUNTER — Encounter: Payer: Self-pay | Admitting: *Deleted

## 2011-10-17 ENCOUNTER — Telehealth: Payer: Self-pay | Admitting: Gastroenterology

## 2011-10-17 NOTE — Telephone Encounter (Signed)
Pt with hx of GERD, Gallstones, IBS, Dysphagia. Last EGD 06/27/11 with dilation of stricture and HH; COLON 01/05/2009 with internal hemorrhoids. Pt reports rectal bleeding since last Monday; he's had this problem in the past, but never for this long. He also reports constipation on and off. Pt given an appt with Dr Jarold Motto tomorrow at 0900am. He was instructed to take sitz baths and use moist towelettes rather than toilet paper; pt stated understanding.

## 2011-10-18 ENCOUNTER — Encounter: Payer: Self-pay | Admitting: Gastroenterology

## 2011-10-18 ENCOUNTER — Ambulatory Visit (INDEPENDENT_AMBULATORY_CARE_PROVIDER_SITE_OTHER): Payer: 59 | Admitting: Gastroenterology

## 2011-10-18 VITALS — BP 134/76 | HR 88 | Ht 71.0 in | Wt 221.4 lb

## 2011-10-18 DIAGNOSIS — R195 Other fecal abnormalities: Secondary | ICD-10-CM

## 2011-10-18 DIAGNOSIS — Z8719 Personal history of other diseases of the digestive system: Secondary | ICD-10-CM

## 2011-10-18 DIAGNOSIS — Z800 Family history of malignant neoplasm of digestive organs: Secondary | ICD-10-CM

## 2011-10-18 DIAGNOSIS — F419 Anxiety disorder, unspecified: Secondary | ICD-10-CM

## 2011-10-18 DIAGNOSIS — F341 Dysthymic disorder: Secondary | ICD-10-CM

## 2011-10-18 DIAGNOSIS — K625 Hemorrhage of anus and rectum: Secondary | ICD-10-CM

## 2011-10-18 MED ORDER — PRAMOXINE-HC 1-1 % EX CREA: TOPICAL_CREAM | CUTANEOUS | Status: DC

## 2011-10-18 MED ORDER — PEG-KCL-NACL-NASULF-NA ASC-C 100 G PO SOLR: 1.0000 | Freq: Once | ORAL | Status: DC

## 2011-10-18 NOTE — Progress Notes (Signed)
This is a long-term patient of mine who is a 66 year old [place] American male. He has multiple medical problems with hypertension and anxiety and depression. He previously was on Coumadin for recurrent DVTs, and is currently on aspirin 81 mg a day and no other anticoagulants. His long history of IBS with a negative colonoscopy 3 years ago. His father did have colon cancer. Patient also has had robotic surgery for prostate cancer, has chronic sleep apnea. Her problems revolve around 10 days of daily rectal bleeding of bright red blood with some suprapubic and rectal discomfort. He has not had passage of blood clots, melena, Retin-A upper gastrointestinal or hepatobiliary symptoms. The patient is on daily PPI therapy, and has a history of asymptomatic gallstones. Review of his chart shows that he is on Anaprox 220 mg when necessary, Zoloft, Xanax, and Dexilant. He denies GERD as reflux symptoms or dysphagia.  Current Medications, Allergies, Past Medical History, Past Surgical History, Family History and Social History were reviewed in [employer] record.  Pertinent Review of Systems Negative.... Complaints of cold hands and feet, night sweats, general malaise and fatigue. He denies any genitourinary, cardiovascular or pulmonary complaints at this time.   Physical Exam: Healthy appearing patient in no acute distress. Blood pressure 134/76, pulse 88 and regular, and BMI of 31. I cannot appreciate stigmata of chronic liver disease. His chest is clear he appears to be in regular rhythm without murmurs gallops or rubs. I cannot appreciate hepatosplenomegaly, abdominal masses or tenderness. Bowel sounds are normal. Inspection of the rectum is unremarkable without fissures or fistulae. There are 2 hemorrhoid columns in the anal canal with some rectal edema and swelling. There is soft tissue present which is normal color but markedly guaiac positive. Mental status is normal her peripheral  extremities are unremarkable.    Assessment and Plan: Probable hemorrhoidal bleeding, rule out colonic polyposis and colon cancer. I have scheduled colonoscopy ASAP. He expressed a desire for possible injection therapy of his hemorrhoids. We will check anemia profile and CBC. We'll prescribe when necessary Analpram cream for now. I've advised him to continue his other medications as listed and reviewed including his daily Dexilant 60 mg every morning for his chronic acid reflux. Encounter Diagnosis  Name Primary?  . Rectal bleeding Yes

## 2011-10-18 NOTE — Patient Instructions (Addendum)
You have been given a separate informational sheet regarding your tobacco use, the importance of quitting and local resources to help you quit. Your procedure has been scheduled for 10/19/2011, please follow the seperate instructions.  Please go to the basement today for your labs.  Your prescription(s) have been sent to you pharmacy.

## 2011-10-19 ENCOUNTER — Encounter: Payer: Self-pay | Admitting: Gastroenterology

## 2011-10-19 ENCOUNTER — Ambulatory Visit (AMBULATORY_SURGERY_CENTER): Payer: 59 | Admitting: Gastroenterology

## 2011-10-19 VITALS — BP 159/93 | HR 98 | Temp 97.5°F | Resp 13 | Ht 68.0 in | Wt 221.0 lb

## 2011-10-19 DIAGNOSIS — K625 Hemorrhage of anus and rectum: Secondary | ICD-10-CM

## 2011-10-19 DIAGNOSIS — K219 Gastro-esophageal reflux disease without esophagitis: Secondary | ICD-10-CM

## 2011-10-19 DIAGNOSIS — K644 Residual hemorrhoidal skin tags: Secondary | ICD-10-CM

## 2011-10-19 DIAGNOSIS — K5730 Diverticulosis of large intestine without perforation or abscess without bleeding: Secondary | ICD-10-CM

## 2011-10-19 MED ORDER — SODIUM CHLORIDE 0.9 % IV SOLN: 500.0000 mL | INTRAVENOUS | Status: DC

## 2011-10-19 NOTE — Progress Notes (Signed)
Propofol was administered by Brennan Bailey, CRNA. MAW  The pt tolerated the colonoscopy very well. Maw

## 2011-10-19 NOTE — Progress Notes (Signed)
Patient did not experience any of the following events: a burn prior to discharge; a fall within the facility; wrong site/side/patient/procedure/implant event; or a hospital transfer or hospital admission upon discharge from the facility. (G8907) Patient did not have preoperative order for IV antibiotic SSI prophylaxis. (G8918)  

## 2011-10-19 NOTE — Patient Instructions (Signed)

## 2011-10-19 NOTE — Op Note (Addendum)
[facility] [address] [place], [place]  [address]  COLONOSCOPY PROCEDURE REPORT  PATIENT:  Andrew, Ford  MR#:  [id number] BIRTHDATE:  11/01/1945, 65 yrs. old  GENDER:  male ENDOSCOPIST:  Vania Rea. Jarold Motto, MD, [facility] REF. BY: PROCEDURE DATE:  10/19/2011 PROCEDURE:  Diagnostic Colonoscopy ASA CLASS:  Class II INDICATIONS:  Colorectal cancer screening, average risk, blood in stool, Gastrointestinal hemorrhage MEDICATIONS:   propofol (Diprivan) 200 mg IV  DESCRIPTION OF PROCEDURE:   After the risks and benefits and of the procedure were explained, informed consent was obtained. Digital rectal exam was performed and revealed no abnormalities. The [id number] endoscope was introduced through the anus and advanced to the cecum, which was identified by both the appendix and ileocecal valve.  The quality of the prep was excellent, using MoviPrep.  The instrument was then slowly withdrawn as the colon was fully examined. <<PROCEDUREIMAGES>>  FINDINGS:  no active bleeding or blood in c.  No polyps or cancers were seen.  External Hemorrhoids were found. NO BLEEDING NOTED. There were mild diverticular changes in left colon. diverticulosis was found.   Retroflexed views in the rectum revealed no abnormalities.    The scope was then withdrawn from the patient and the procedure completed.  COMPLICATIONS:  None ENDOSCOPIC IMPRESSION: 1) No active bleeding or blood in c 2) No polyps or cancers 3) External hemorrhoids 4) Diverticulosis RECOMMENDATIONS: 1) High fiber diet. 2) Repeat Colonoscopy in 5 years. 3) metamucil or benefiber LOCAL ANAL CARE AND CREAMS  REPEAT EXAM:  No  ______________________________ Vania Rea. Jarold Motto, MD, [facility]  CC:  Romero Liner, MD  n. REVISED:  10/19/2011 02:40 PM eSIGNED:   Vania Rea. Ellese Julius at 10/19/2011 02:40 PM  Nolene Ebbs, [phone]

## 2011-10-20 ENCOUNTER — Telehealth: Payer: Self-pay

## 2011-10-20 NOTE — Telephone Encounter (Signed)
  Follow up Call-  Call back number 10/19/2011 06/27/2011  Post procedure Call Back phone  # [phone],OK TO LEAVE A MESSAGE  Permission to leave phone message Yes -     Patient questions:  Do you have a fever, pain , or abdominal swelling? no Pain Score  0 *  Have you tolerated food without any problems? yes  Have you been able to return to your normal activities? no  Do you have any questions about your discharge instructions: Diet   no Medications  no Follow up visit  no  Do you have questions or concerns about your Care? no  Actions: * If pain score is 4 or above: No action needed, pain <4.  Per the pt "everything was great as always". Maw

## 2011-10-28 ENCOUNTER — Ambulatory Visit: Payer: 59 | Admitting: Gastroenterology

## 2011-11-07 ENCOUNTER — Other Ambulatory Visit: Payer: 59 | Admitting: Lab

## 2011-11-07 LAB — PSA: PSA: <0.01 ng/mL (ref <=4.00)

## 2011-11-11 ENCOUNTER — Encounter: Payer: Self-pay | Admitting: Oncology

## 2011-11-11 ENCOUNTER — Ambulatory Visit (HOSPITAL_BASED_OUTPATIENT_CLINIC_OR_DEPARTMENT_OTHER): Payer: 59 | Admitting: Oncology

## 2011-11-11 ENCOUNTER — Telehealth: Payer: Self-pay | Admitting: Oncology

## 2011-11-11 VITALS — BP 136/81 | HR 70 | Temp 98.7°F | Ht 68.0 in | Wt 223.8 lb

## 2011-11-11 DIAGNOSIS — C679 Malignant neoplasm of bladder, unspecified: Secondary | ICD-10-CM

## 2011-11-11 DIAGNOSIS — C61 Malignant neoplasm of prostate: Secondary | ICD-10-CM

## 2011-11-11 DIAGNOSIS — I824Z9 Acute embolism and thrombosis of unspecified deep veins of unspecified distal lower extremity: Secondary | ICD-10-CM

## 2011-11-11 DIAGNOSIS — I2699 Other pulmonary embolism without acute cor pulmonale: Secondary | ICD-10-CM

## 2011-11-11 DIAGNOSIS — Z8546 Personal history of malignant neoplasm of prostate: Secondary | ICD-10-CM

## 2011-11-11 NOTE — Telephone Encounter (Signed)
gv pt appt schedule for march 2014 °

## 2011-11-11 NOTE — Progress Notes (Signed)
Hematology and Oncology Follow Up Visit  [name] [id number] 03-Feb-1946 66 y.o. 11/11/2011 2:10 PM   Principle Diagnosis: Encounter Diagnoses  Name Primary?  . Prostate ca Yes  . Papillary transitional cell carcinoma of bladder   . PROSTATE CANCER, HX OF      Interim History:    A follow-up visit for this 66 year old man, initially referred to this office for evaluation of Gleason 3+3 prostate cancer and, in addition,  a single area of superficial papillary carcinoma of the bladder, which was not invasive, and excised at the time of cystoscopy.  I felt that he would be best served by a total prostatectomy,  given his young age at the time (66 years old). He did undergo a total prostatectomy by Dr. Barron Alvine on October 20, 2008.  He was readmitted to the hospital on March 13 with an acute pulmonary embolus and right lower extremity DVT.  He was anticoagulated for six months.  A D-dimer was assayed on October 07, 2009 and was normal at .29 (normal less than .48). He has done well with no evidence for recurrent prostate or bladder cancer. He just had a followup cystoscopy yesterday and everything checked out normal. PSA done in anticipation of today's visit was undetectable at less than 0.01 He denies any hematuria. No dyspnea. No chest pain or palpitations. No leg swelling or pain.  Medications: reviewed  Allergies:  Allergies  Allergen Reactions  . Aspirin     REACTION: gi upset with higher dose  . Iohexol      Code: HIVES, Desc: [name] explained to me on 10/25/08 that someone misunderstood him when he spoke about his hives reaction to IV dye.[name] stated that he only had a reaction to MRI IV dye, not CT dye. [name] also explained that he described his "feeling" he had, Onset Date: [id number]   Code: HIVES, Desc: (continued),,"feeling" he had was consistent with what I described as the "normal" feeling most people have during CT IV dye administration. [name] stated  he was fine and that the "breath"issue was his anxiousness and not actual dyspnea., Onset Date: [id number]   . Penicillins Itching    Review of Systems: Constitutional:   No constitutional symptoms Respiratory: No cough or dyspnea Cardiovascular:  See above Gastrointestinal: No change in bowel habit Genito-Urinary: No urinary tract symptoms Musculoskeletal: Neurologic: Skin: Remaining ROS negative.  Physical Exam: Blood pressure 136/81, pulse 70, temperature 98.7 F (37.1 C), temperature source Oral, height 5\' 8"  (1.727 m), weight 223 lb 12.8 oz (101.515 kg). Wt Readings from Last 3 Encounters:  11/11/11 223 lb 12.8 oz (101.515 kg)  10/19/11 221 lb (100.245 kg)  10/18/11 221 lb 6 oz (100.415 kg)     General appearance: Well-nourished Caucasian man HENNT: Pharynx no erythema or exudate Lymph nodes: No cervical or axillary adenopathy Breasts: Lungs: Clear to auscultation resonant to percussion Heart: Regular rhythm no murmur Abdomen: Soft nontender no mass no organomegaly Extremities: No edema no calf tenderness Vascular: No cyanosis Neurologic: Motor strength 5 over 5 reflexes absent symmetric at the knees 1+ symmetric at the biceps Skin: No rash or ecchymosis  Lab Results: Lab Results  Component Value Date   WBC 7.1 03/30/2011   HGB 14.5 03/30/2011   HCT 42.7 03/30/2011   MCV 88.4 03/30/2011   PLT 181 03/30/2011     Chemistry      Component Value Date/Time   NA 140 06/22/2011 1005   K 4.0 06/22/2011 1005  CL 101 06/22/2011 1005   CO2 31 06/22/2011 1005   BUN 18 06/22/2011 1005   CREATININE 1.1 06/22/2011 1005      Component Value Date/Time   CALCIUM 9.3 06/22/2011 1005   ALKPHOS 55 06/22/2011 1005   AST 32 06/22/2011 1005   ALT 38 06/22/2011 1005   BILITOT 0.9 06/22/2011 1005    PSA less than 0.01    Impression and Plan: #1. Gleason 3+3 prostate cancer treated with radical prostatectomy. No evidence for new disease now out 3 years from surgery.  #2. Superficial  papillary carcinoma of the bladder treated with surgical excision. No evidence for recurrence.  #3. Perioperative pulmonary embolus following prostate cancer surgery 3 years ago. He was treated with limited anticoagulation. No recurrent thrombotic events. Normal protein C S. and protein C levels in the past.  #4. Obstructive sleep apnea on CPAP  #5. Essential hypertension.  #6. History of depression on Zoloft  #7. Chronic polymyalgia and polyarthralgia with negative serology for lupus or rheumatoid arthritis  #8. GERD   CC:. Dr. Merri Brunette; Dr. Barron Alvine   Levert Feinstein, MD 3/29/20132:10 PM

## 2011-11-30 ENCOUNTER — Other Ambulatory Visit: Payer: Self-pay | Admitting: Cardiovascular Disease

## 2011-11-30 MED ORDER — METOPROLOL SUCCINATE ER 100 MG PO TB24: ORAL_TABLET | ORAL | Status: DC

## 2011-12-30 ENCOUNTER — Other Ambulatory Visit: Payer: Self-pay | Admitting: Gastroenterology

## 2012-01-19 ENCOUNTER — Ambulatory Visit (HOSPITAL_COMMUNITY): Admission: RE | Admit: 2012-01-19 | Payer: 59 | Source: Ambulatory Visit

## 2012-01-20 ENCOUNTER — Ambulatory Visit (HOSPITAL_COMMUNITY)
Admission: RE | Admit: 2012-01-20 | Discharge: 2012-01-20 | Disposition: A | Discharge status: Home / Self Care | Payer: 59 | Source: Ambulatory Visit | Attending: Neurology | Admitting: Neurology

## 2012-01-20 DIAGNOSIS — Z86711 Personal history of pulmonary embolism: Secondary | ICD-10-CM | POA: Insufficient documentation

## 2012-01-20 DIAGNOSIS — M79609 Pain in unspecified limb: Secondary | ICD-10-CM | POA: Insufficient documentation

## 2012-01-20 DIAGNOSIS — M79606 Pain in leg, unspecified: Secondary | ICD-10-CM

## 2012-01-20 DIAGNOSIS — Z86718 Personal history of other venous thrombosis and embolism: Secondary | ICD-10-CM | POA: Insufficient documentation

## 2012-01-20 DIAGNOSIS — R609 Edema, unspecified: Secondary | ICD-10-CM

## 2012-01-20 DIAGNOSIS — M7989 Other specified soft tissue disorders: Secondary | ICD-10-CM | POA: Insufficient documentation

## 2012-01-20 NOTE — Progress Notes (Signed)
*  PRELIMINARY RESULTS* Vascular Ultrasound Lower extremity venous duplex has been completed.  Preliminary findings: Bilaterally no evidence of DVT.  At 9:30am Attempted to call report to Dr. Larina Earthly office at [phone]. No answer and no answering machine.  Farrel Demark RDMS 01/20/2012, 11:50 AM

## 2012-01-27 ENCOUNTER — Other Ambulatory Visit: Payer: Self-pay | Admitting: Gastroenterology

## 2012-02-27 ENCOUNTER — Other Ambulatory Visit: Payer: Self-pay | Admitting: Gastroenterology

## 2012-04-06 ENCOUNTER — Other Ambulatory Visit: Payer: Self-pay | Admitting: Gastroenterology

## 2012-04-19 ENCOUNTER — Ambulatory Visit
Admission: RE | Admit: 2012-04-19 | Discharge: 2012-04-19 | Disposition: A | Discharge status: Home / Self Care | Payer: 59 | Source: Ambulatory Visit | Attending: Internal Medicine | Admitting: Internal Medicine

## 2012-04-19 ENCOUNTER — Other Ambulatory Visit: Payer: Self-pay | Admitting: Internal Medicine

## 2012-04-19 DIAGNOSIS — R0600 Dyspnea, unspecified: Secondary | ICD-10-CM

## 2012-04-19 DIAGNOSIS — R05 Cough: Secondary | ICD-10-CM

## 2012-04-19 MED ORDER — IOHEXOL 350 MG/ML SOLN
125.0000 mL | Freq: Once | INTRAVENOUS | Status: AC | PRN
  Administered 2012-04-19: 125 mL via INTRAVENOUS

## 2012-04-26 ENCOUNTER — Telehealth: Payer: Self-pay | Admitting: *Deleted

## 2012-04-26 NOTE — Telephone Encounter (Signed)
Received call from pt stating that he has another pulmonary embolis for no reason this time.  He reports that he hasn't been flying/travelling/etc.  He states that he wasn't feeling well & felt like he had the flu & felt SOB & went to see Dr Renne Crigler & CT chest was ordered & clot was found.  He is now on xalrelto.  He was told to make an appt with Dr Cyndie Chime for f/u.  Message will be left for Dr. Cyndie Chime on 04/27/12.  Pt request that mobile # be called [phone].

## 2012-04-27 ENCOUNTER — Other Ambulatory Visit: Payer: Self-pay | Admitting: Oncology

## 2012-04-27 DIAGNOSIS — I2699 Other pulmonary embolism without acute cor pulmonale: Secondary | ICD-10-CM

## 2012-04-27 DIAGNOSIS — Z8546 Personal history of malignant neoplasm of prostate: Secondary | ICD-10-CM

## 2012-05-01 ENCOUNTER — Telehealth: Payer: Self-pay | Admitting: Oncology

## 2012-05-01 NOTE — Telephone Encounter (Signed)
[name] Pt and made aware of nxt appt 9/18 and 10/7

## 2012-05-02 ENCOUNTER — Other Ambulatory Visit (HOSPITAL_BASED_OUTPATIENT_CLINIC_OR_DEPARTMENT_OTHER): Payer: 59 | Admitting: Lab

## 2012-05-02 DIAGNOSIS — I2699 Other pulmonary embolism without acute cor pulmonale: Secondary | ICD-10-CM

## 2012-05-02 DIAGNOSIS — Z8546 Personal history of malignant neoplasm of prostate: Secondary | ICD-10-CM

## 2012-05-02 LAB — CBC & DIFF AND RETIC
BASO%: 0.3 % (ref 0.0–2.0)
Eosinophils Absolute: 0.2 10*3/uL (ref 0.0–0.5)
MONO#: 0.5 10*3/uL (ref 0.1–0.9)
NEUT#: 3.8 10*3/uL (ref 1.5–6.5)
RBC: 4.97 10*6/uL (ref 4.20–5.82)
RDW: 13.5 % (ref 11.0–14.6)
Retic %: 2.24 % — ABNORMAL HIGH (ref 0.80–1.80)
Retic Ct Abs: 111.33 10*3/uL — ABNORMAL HIGH (ref 34.80–93.90)
WBC: 6.4 10*3/uL (ref 4.0–10.3)
lymph#: 2 10*3/uL (ref 0.9–3.3)

## 2012-05-04 LAB — CARDIOLIPIN ANTIBODIES, IGG, IGM, IGA
Anticardiolipin IgA: 7 APL U/mL (ref <22)
Anticardiolipin IgM: 21 MPL U/mL — ABNORMAL HIGH (ref <11)

## 2012-05-04 LAB — LUPUS ANTICOAGULANT PANEL
DRVVT 1:1 Mix: 41.9 secs (ref <45.1)
Lupus Anticoagulant: DETECTED — AB

## 2012-05-04 LAB — PSA: PSA: <0.01 ng/mL (ref <=4.00)

## 2012-05-04 LAB — BETA-2 GLYCOPROTEIN ANTIBODIES
Beta-2 Glyco I IgG: 0 G Units (ref <20)
Beta-2-Glycoprotein I IgM: 50 M Units — ABNORMAL HIGH (ref <20)

## 2012-05-04 LAB — ANTITHROMBIN III: AntiThromb III Func: 88 % (ref 76–126)

## 2012-05-21 ENCOUNTER — Other Ambulatory Visit: Payer: Self-pay | Admitting: Gastroenterology

## 2012-05-21 ENCOUNTER — Ambulatory Visit (HOSPITAL_BASED_OUTPATIENT_CLINIC_OR_DEPARTMENT_OTHER): Payer: 59 | Admitting: Oncology

## 2012-05-21 VITALS — BP 140/82 | HR 78 | Temp 97.0°F | Resp 20 | Ht 68.0 in | Wt 229.4 lb

## 2012-05-21 DIAGNOSIS — I10 Essential (primary) hypertension: Secondary | ICD-10-CM

## 2012-05-21 DIAGNOSIS — D6861 Antiphospholipid syndrome: Secondary | ICD-10-CM

## 2012-05-21 DIAGNOSIS — Z86718 Personal history of other venous thrombosis and embolism: Secondary | ICD-10-CM

## 2012-05-21 DIAGNOSIS — C61 Malignant neoplasm of prostate: Secondary | ICD-10-CM

## 2012-05-21 DIAGNOSIS — I2699 Other pulmonary embolism without acute cor pulmonale: Secondary | ICD-10-CM

## 2012-05-22 ENCOUNTER — Encounter: Payer: Self-pay | Admitting: Oncology

## 2012-05-22 ENCOUNTER — Telehealth: Payer: Self-pay | Admitting: Oncology

## 2012-05-22 DIAGNOSIS — D6861 Antiphospholipid syndrome: Secondary | ICD-10-CM

## 2012-05-22 HISTORY — DX: Antiphospholipid syndrome: D68.61

## 2012-05-22 NOTE — Progress Notes (Signed)
Hematology and Oncology Follow Up Visit  [name] [id number] [birth year] 66 y.o. 05/22/2012 10:31 AM   Principle Diagnosis: Encounter Diagnoses  Name Primary?  . Pulmonary embolus Yes  . Antiphospholipid antibody syndrome      Interim History:   Unscheduled visit for this 66 year old man initially referred to this office for advice on early stage, Gleason 3+3 prostate cancer. He went on to have a robotic prostatectomy in March 2010. Tumor was upgraded to a Gleason 7. He was found to have a incidental papillary carcinoma in the bladder. A few days after surgery he developed right lower extremity swelling. Although Doppler studies were negative, a CT angiogram of the chest showed small, bilateral, lower lobe, pulmonary emboli and he was put on full dose Coumadin anticoagulation for 6 months.  At the end of August of this year, he had a flulike illness with high fever over 102, diffuse myalgias, a unusual sensation in his head. He developed mild dyspnea with no pleuritic component, no cough, no sore throat, no hemoptysis. He saw his internist who was suspicious that he may be having another clot. He was sent for a CT angiogram of the chest done on September 5 which did in fact show small subsegmental pulmonary emboli within the right lower lobe pulmonary artery. He was started back on anticoagulation with Xarelto.  I didn't do a hypercoagulation profile after his initial PE since it seemed to be clearly related to the recent surgery. I did do one in anticipation of today's visit and to my surprise, he tested positive for the presence of a lupus-type anticoagulant and has elevated IgM antibody titers against both anticardiolipin and beta- 2 glycoprotein 1 consistent with an antiphospholipid antibody syndrome.  He denies any calf pain or swelling. His mild dyspnea has resolved.   Medications: reviewed  Allergies:  Allergies  Allergen Reactions  . Aspirin     REACTION: gi upset with  higher dose  . Iohexol      Code: HIVES, Desc: Mr. Ran explained to me on 10/25/08 that someone misunderstood him when he spoke about his hives reaction to IV dye.Mr. Pokorny stated that he only had a reaction to MRI IV dye, not CT dye. Mr. Dolecki also explained that he described his "feeling" he had, Onset Date: [id number]   Code: HIVES, Desc: (continued),,"feeling" he had was consistent with what I described as the "normal" feeling most people have during CT IV dye administration. Mr. Petzold stated he was fine and that the "breath"issue was his anxiousness and not actual dyspnea., Onset Date: [id number]   . Penicillins Itching    Review of Systems: Constitutional:   No constitutional symptoms Respiratory: No cough, dyspnea, hemoptysis Cardiovascular:  No chest pain, palpitations Gastrointestinal: No abdominal pain, no change in bowel habit Genito-Urinary: No urinary tract symptoms  Musculoskeletal: No calf pain or swelling Neurologic: Not questioned Skin: No rash Remaining ROS negative.  Physical Exam: Blood pressure 140/82, pulse 78, temperature 97 F (36.1 C), temperature source Oral, resp. rate 20, height 5\' 8"  (1.727 m), weight 229 lb 6.4 oz (104.055 kg). Wt Readings from Last 3 Encounters:  05/21/12 229 lb 6.4 oz (104.055 kg)  11/11/11 223 lb 12.8 oz (101.515 kg)  10/19/11 221 lb (100.245 kg)     General appearance: Well-nourished Caucasian man HENNT: Pharynx no erythema or exudate Lymph nodes: No adenopathy Breasts: Lungs: Clear to auscultation resonant to percussion Heart: Regular rhythm no murmur Abdomen: Soft, nontender, no mass, no organomegaly Extremities: No edema, no  calf tenderness Vascular: No cyanosis Neurologic: Motor strength 5 over 5, reflexes 1+ symmetric Skin: No rash or ecchymosis  Lab Results: Lab Results  Component Value Date   WBC 6.4 05/02/2012   HGB 15.1 05/02/2012   HCT 43.8 05/02/2012   MCV 88.1 05/02/2012   PLT 223 05/02/2012     Chemistry       Component Value Date/Time   NA 140 06/22/2011 1005   K 4.0 06/22/2011 1005   CL 101 06/22/2011 1005   CO2 31 06/22/2011 1005   BUN 18 06/22/2011 1005   CREATININE 1.1 06/22/2011 1005      Component Value Date/Time   CALCIUM 9.3 06/22/2011 1005   ALKPHOS 55 06/22/2011 1005   AST 32 06/22/2011 1005   ALT 38 06/22/2011 1005   BILITOT 0.9 06/22/2011 1005       Radiological Studies: See discussion above   Impression and Plan: #1. Recurrent pulmonary emboli-low-volume-both related to an inciting event: Initially surgery for prostate cancer and recently following an acute viral illness. He is now found to have antiphospholipid antibody syndrome. Although this may be coincidental and provoked by the acute viral illness, in view of his past history this may be a chronic problem. Recommendation: For the time being, he needs to remain on long-term anticoagulation. I will repeat an antiphospholipid panel again in March. The lupus anticoagulant test will be unreliable on the Xarelto blood it should not affect the IgM antibodies against anticardiolipin and beta 2 glycoprotein 1. I discussed the diagnosis of antiphospholipid antibody syndrome with him and gave him written notes for his reference. I don't think that any of his family members need to be tested. This is usually an acquired condition and not familial.  #2. Gleason 3+4 prostate cancer treated with surgery. I repeated a PSA in anticipation of today's visit just to demonstrate that it is not a recurrence of his cancer that is related to his blood clot in his PSA remains undetectable at less than 0.01 done on 05/02/2012  #3. Chronic musculoskeletal complaints/idiopathic/fibromyalgia syndrome?  #4. Essential hypertension  #5. Obstructive sleep apnea  #6. History of depression on Zoloft  #7. GERD  #8. Superficial papillary carcinoma of the bladder treated with surgical excision.   CC:. Dr. Merri Brunette; Dr. Barron Alvine   Levert Feinstein, MD 10/8/201310:31 AM

## 2012-05-22 NOTE — Assessment & Plan Note (Signed)
Recurrent PE following acute viral illness 9/13  With findings of positive lupus anticoagulant and elevated anticardiolipin & anti beta-2-GP-1 antibodies

## 2012-05-22 NOTE — Telephone Encounter (Signed)
line busy.....Marland Kitchenmailed apt schedule for March 2014

## 2012-05-25 ENCOUNTER — Encounter: Payer: Self-pay | Admitting: Cardiovascular Disease

## 2012-05-28 ENCOUNTER — Encounter: Payer: Self-pay | Admitting: Cardiovascular Disease

## 2012-06-18 ENCOUNTER — Other Ambulatory Visit: Payer: Self-pay | Admitting: Gastroenterology

## 2012-06-21 ENCOUNTER — Other Ambulatory Visit: Payer: Self-pay | Admitting: Cardiovascular Disease

## 2012-06-21 MED ORDER — RAMIPRIL 10 MG PO TABS: 10.0000 mg | ORAL_TABLET | Freq: Two times a day (BID) | ORAL | Status: DC

## 2012-06-27 ENCOUNTER — Other Ambulatory Visit: Payer: Self-pay | Admitting: *Deleted

## 2012-09-21 ENCOUNTER — Encounter: Payer: Self-pay | Admitting: Gastroenterology

## 2012-09-21 ENCOUNTER — Ambulatory Visit (INDEPENDENT_AMBULATORY_CARE_PROVIDER_SITE_OTHER): Payer: 59 | Admitting: Gastroenterology

## 2012-09-21 VITALS — BP 120/80 | HR 92 | Ht 68.0 in | Wt 220.8 lb

## 2012-09-21 DIAGNOSIS — R109 Unspecified abdominal pain: Secondary | ICD-10-CM

## 2012-09-21 DIAGNOSIS — K5730 Diverticulosis of large intestine without perforation or abscess without bleeding: Secondary | ICD-10-CM

## 2012-09-21 DIAGNOSIS — G894 Chronic pain syndrome: Secondary | ICD-10-CM

## 2012-09-21 DIAGNOSIS — K219 Gastro-esophageal reflux disease without esophagitis: Secondary | ICD-10-CM

## 2012-09-21 MED ORDER — HYOSCYAMINE SULFATE 0.125 MG SL SUBL: 0.1250 mg | SUBLINGUAL_TABLET | SUBLINGUAL | Status: DC | PRN

## 2012-09-21 MED ORDER — MESALAMINE ER 0.375 G PO CP24: ORAL_CAPSULE | ORAL | Status: DC

## 2012-09-21 NOTE — Progress Notes (Signed)
This is a 67 year old Caucasian male with chronic clotting disorder requiring Xarelto milligrams a day, and he is followed in hematology by Dr. Cyndie Chime. He has had previous surgery for prostate and bladder cancer, and has chronic pain syndrome requiring when necessary hydrocodone.  His has a long history of constipation with abdominal gas, bloating, and colonoscopy one year ago was unremarkable except for very mild diverticulosis.  He does take fairly regular Anaprox 220 mg one to 2 times a day, and daily Xanax for anxiety.  He now complains of several months of  Increasing pressure in his lower abdominal and pelvic area with gas and bloating and mild constipation.  This has been greatly improved by daily Metamucil as recommended per primary care.  He has no rectal bleeding, melena, upper GI or hepatobiliary complaints.  His appetite is good and his weight is stable.  He denies fever, chills, nausea vomiting, or any current specific genitourinary or hepatobiliary complaints.  Last CT scan of the abdomen was in 2010.   Current Medications, Allergies, Past Medical History, Past Surgical History, Family History and Social History were reviewed in [employer] record.  ROS: All systems were reviewed and are negative unless otherwise stated in the HPI.          Physical Exam: Healthy-appearing patient in no distress.  I cannot appreciate stigmata of chronic liver disease.  Blood pressure 120/80, pulse 92 and regular, and weight 220 pounds with BMI of 33.57.  96% oxygen saturation.  There is no abdominal distention, organomegaly, masses or tenderness.  Bowel sounds are normal.  Rectal exam shows no masses, tenderness, but there is hard stool in the rectal vault which is guaiac negative.  Mental status is normal.    Assessment and plan: Chronic abdominal pain in a patient with a history of a chronic clotting disorder and previous robotic surgery for prostate cancer with an  incidental papillary bladder cancer.  He's had chronic GI complaints for many years consistent with IBS.  His current symptoms seem most consistent with probable SCAD.Marland Kitchen segmental colitis associated diverticular disease..., as mentioned above his had colonoscopy within the last year.  I have ordered CT scan of the abdomen and pelvis to be complete, have placed him on Apriso 4 tablets a day as an oral aminosalicylate, when necessary sublingual Levsin, and we'll continue his high fiber diet and daily Metamucil.  This patient on a she has chronic pain problems and does take hydrocodone and NSAIDs.  Other considerations would be an element of bacterial overgrowth syndrome associated with his multiple medications.  He will see me back in one month's time for followup.  I've asked her to continue his followup appointment with hematology and internal medicine as scheduled.  His GERD is well controlled with daily Dexilant 60 mg.

## 2012-09-21 NOTE — Patient Instructions (Addendum)
Please purchase Metamucil over the counter. Take as directed.  Please take Apriso one capsule by mouth four times daily, prescription sent to your pharmacy. Samples given today as well.   We have sent the following medications to your pharmacy for you to pick up at your convenience: Levsin, please take as directed.  Please make a follow up visit with Dr. Jarold Motto in three weeks.  ______________________________________________________________________________________________________________________  [name] have been scheduled for a CT scan of the abdomen and pelvis at [facility] ([address]---this is in the same building as Architectural technologist).   You are scheduled on 09-26-2012 at 9 AM. You should arrive 15 minutes prior to your appointment time for registration. Please follow the written instructions below on the day of your exam:  WARNING: IF YOU ARE ALLERGIC TO IODINE/X-RAY DYE, PLEASE NOTIFY RADIOLOGY IMMEDIATELY AT [phone]! YOU WILL BE GIVEN A 13 HOUR PREMEDICATION PREP.  1) Do not eat or drink anything after 5 AM (4 hours prior to your test) 2) You have been given 2 bottles of oral contrast to drink. The solution may taste better if refrigerated, but do NOT add ice or any other liquid to this solution. Shake well before drinking.    Drink 1 bottle of contrast @ 7 AM (2 hours prior to your exam)  Drink 1 bottle of contrast @ 8 AM (1 hour prior to your exam)  You may take any medications as prescribed with a small amount of water except for the following: Metformin, Glucophage, Glucovance, Avandamet, Riomet, Fortamet, Actoplus Met, Janumet, Glumetza or Metaglip. The above medications must be held the day of the exam AND 48 hours after the exam.  The purpose of you drinking the oral contrast is to aid in the visualization of your intestinal tract. The contrast solution may cause some diarrhea. Before your exam is started, you will be given a small amount of fluid to  drink. Depending on your individual set of symptoms, you may also receive an intravenous injection of x-ray contrast/dye. Plan on being at [facility] for 30 minutes or long, depending on the type of exam you are having performed.  This test typically takes 30-45 minutes to complete.  If you have any questions regarding your exam or if you need to reschedule, you may call the CT department at [phone] between the hours of 8:00 am and 5:00 pm, Monday-Friday.  ________________________________________________________________________________________________________  High-Fiber Diet Fiber is found in fruits, vegetables, and grains. A high-fiber diet encourages the addition of more whole grains, legumes, fruits, and vegetables in your diet. The recommended amount of fiber for adult males is 38 g per day. For adult females, it is 25 g per day. Pregnant and lactating women should get 28 g of fiber per day. If you have a digestive or bowel problem, ask your caregiver for advice before adding high-fiber foods to your diet. Eat a variety of high-fiber foods instead of only a select few type of foods.  PURPOSE  To increase stool bulk.  To make bowel movements more regular to prevent constipation.  To lower cholesterol.  To prevent overeating. WHEN IS THIS DIET USED?  It may be used if you have constipation and hemorrhoids.  It may be used if you have uncomplicated diverticulosis (intestine condition) and irritable bowel syndrome.  It may be used if you need help with weight management.  It may be used if you want to add it to your diet as a protective measure against atherosclerosis, diabetes,  and cancer. SOURCES OF FIBER  Whole-grain breads and cereals.  Fruits, such as apples, oranges, bananas, berries, prunes, and pears.  Vegetables, such as green peas, carrots, sweet potatoes, beets, broccoli, cabbage, spinach, and artichokes.  Legumes, such split peas, soy,  lentils.  Almonds. FIBER CONTENT IN FOODS Starches and Grains / Dietary Fiber (g)  Cheerios, 1 cup / 3 g  Corn Flakes cereal, 1 cup / 0.7 g  Rice crispy treat cereal, 1 cup / 0.3 g  Instant oatmeal (cooked),  cup / 2 g  Frosted wheat cereal, 1 cup / 5.1 g  Brown, long-grain rice (cooked), 1 cup / 3.5 g  White, long-grain rice (cooked), 1 cup / 0.6 g  Enriched macaroni (cooked), 1 cup / 2.5 g Legumes / Dietary Fiber (g)  Baked beans (canned, plain, or vegetarian),  cup / 5.2 g  Kidney beans (canned),  cup / 6.8 g  Pinto beans (cooked),  cup / 5.5 g Breads and Crackers / Dietary Fiber (g)  Plain or honey graham crackers, 2 squares / 0.7 g  Saltine crackers, 3 squares / 0.3 g  Plain, salted pretzels, 10 pieces / 1.8 g  Whole-wheat bread, 1 slice / 1.9 g  White bread, 1 slice / 0.7 g  Raisin bread, 1 slice / 1.2 g  Plain bagel, 3 oz / 2 g  Flour tortilla, 1 oz / 0.9 g  Corn tortilla, 1 small / 1.5 g  Hamburger or hotdog bun, 1 small / 0.9 g Fruits / Dietary Fiber (g)  Apple with skin, 1 medium / 4.4 g  Sweetened applesauce,  cup / 1.5 g  Banana,  medium / 1.5 g  Grapes, 10 grapes / 0.4 g  Orange, 1 small / 2.3 g  Raisin, 1.5 oz / 1.6 g  Melon, 1 cup / 1.4 g Vegetables / Dietary Fiber (g)  Green beans (canned),  cup / 1.3 g  Carrots (cooked),  cup / 2.3 g  Broccoli (cooked),  cup / 2.8 g  Peas (cooked),  cup / 4.4 g  Mashed potatoes,  cup / 1.6 g  Lettuce, 1 cup / 0.5 g  Corn (canned),  cup / 1.6 g  Tomato,  cup / 1.1 g Document Released: 08/01/2005 Document Revised: 01/31/2012 Document Reviewed: 11/03/2011 [facility] Patient Information 2013 [place], [place].     ______________________________________________________________________________________________________________________

## 2012-09-24 ENCOUNTER — Other Ambulatory Visit: Payer: Self-pay | Admitting: Gastroenterology

## 2012-09-26 ENCOUNTER — Ambulatory Visit (INDEPENDENT_AMBULATORY_CARE_PROVIDER_SITE_OTHER)
Admission: RE | Admit: 2012-09-26 | Discharge: 2012-09-26 | Disposition: A | Discharge status: Home / Self Care | Payer: 59 | Source: Ambulatory Visit | Attending: Gastroenterology | Admitting: Gastroenterology

## 2012-09-26 ENCOUNTER — Telehealth: Payer: Self-pay | Admitting: *Deleted

## 2012-09-26 DIAGNOSIS — R109 Unspecified abdominal pain: Secondary | ICD-10-CM

## 2012-09-26 MED ORDER — IOHEXOL 300 MG/ML  SOLN
100.0000 mL | Freq: Once | INTRAMUSCULAR | Status: AC | PRN
  Administered 2012-09-26: 100 mL via INTRAVENOUS

## 2012-09-26 NOTE — Telephone Encounter (Signed)
Notes Recorded by Mardella Layman, MD on 09/26/2012 at 11:03 AM Fatty liver,,,otherwise normal exam !!!!! Informed pt of normal CT; pt stated understanding.

## 2012-10-04 ENCOUNTER — Other Ambulatory Visit: Payer: Self-pay | Admitting: *Deleted

## 2012-10-04 MED ORDER — RAMIPRIL 10 MG PO TABS: 10.0000 mg | ORAL_TABLET | Freq: Two times a day (BID) | ORAL | Status: DC

## 2012-10-04 NOTE — Telephone Encounter (Signed)
pharmacy calling to get refill for pt altace. informed pharmacy pt have not been in our office since 2012 but will fill for 30 days. pharmacy states they will inform patient of that and see if he is seeing another cardiologist or have his pcp fill med. Fax Received. Refill Completed. Riad Wagley Chowoe (R.M.A)

## 2012-10-04 NOTE — Telephone Encounter (Signed)
Opened in Error.

## 2012-10-10 ENCOUNTER — Telehealth: Payer: Self-pay | Admitting: Oncology

## 2012-10-10 NOTE — Telephone Encounter (Signed)
pt phone has been disconnect..will mail letter/cal..td

## 2012-10-15 ENCOUNTER — Telehealth: Payer: Self-pay | Admitting: Oncology

## 2012-10-15 ENCOUNTER — Encounter: Payer: Self-pay | Admitting: *Deleted

## 2012-10-15 NOTE — Telephone Encounter (Signed)
spoke w/pt gv appt d/t..td °

## 2012-10-18 ENCOUNTER — Telehealth: Payer: Self-pay | Admitting: Oncology

## 2012-10-18 NOTE — Telephone Encounter (Signed)
sw pt gave appt d/t..td °

## 2012-10-22 ENCOUNTER — Telehealth: Payer: Self-pay | Admitting: *Deleted

## 2012-10-22 NOTE — Telephone Encounter (Signed)
lm appt d/t was given for 11/27/2012@ 4:30pm...td

## 2012-10-24 ENCOUNTER — Encounter: Payer: Self-pay | Admitting: Gastroenterology

## 2012-10-24 ENCOUNTER — Ambulatory Visit (INDEPENDENT_AMBULATORY_CARE_PROVIDER_SITE_OTHER): Payer: 59 | Admitting: Gastroenterology

## 2012-10-24 VITALS — BP 134/80 | HR 80 | Ht 68.0 in | Wt 220.8 lb

## 2012-10-24 DIAGNOSIS — K5010 Crohn's disease of large intestine without complications: Secondary | ICD-10-CM

## 2012-10-24 DIAGNOSIS — K7689 Other specified diseases of liver: Secondary | ICD-10-CM

## 2012-10-24 DIAGNOSIS — K219 Gastro-esophageal reflux disease without esophagitis: Secondary | ICD-10-CM

## 2012-10-24 DIAGNOSIS — K760 Fatty (change of) liver, not elsewhere classified: Secondary | ICD-10-CM

## 2012-10-24 DIAGNOSIS — K5730 Diverticulosis of large intestine without perforation or abscess without bleeding: Secondary | ICD-10-CM

## 2012-10-24 DIAGNOSIS — Z9089 Acquired absence of other organs: Secondary | ICD-10-CM

## 2012-10-24 NOTE — Progress Notes (Signed)
This is a 67 year old Caucasian male with SCAD , segmental colitis with associated diverticulosis, doing better on daily by mouth aminosalicylate therapy.  He denies abdominal pain, but does occasionally have spasmodic discomfort relieved by when necessary Levsin, but this is cause tachycardia, he has discontinued these medications.  Has multiple cardiovascular issues at a history of associated coagulation problems and is on Xarelto 20 mg a day.Marland Kitchen  He also has chronic GERD without any history of Barrett's mucosa, and is on Protonix daily without reflux symptoms or dysphagia.  He is up-to-date on his endoscopy and colonoscopy exams.  Recent CT scan of the abdomen was unremarkable except for fatty infiltration of the liver.  His liver function test are followed by Dr. Renne Crigler , and the patient relates that these have been essentially normal.  His evaluation for other causes of liver disease in 2004 was negative.  Patient does have a history of chronic lactose intolerance.  He has had previous cholecystectomy for cholelithiasis.  Current Medications, Allergies, Past Medical History, Past Surgical History, Family History and Social History were reviewed in [employer] record.  ROS: All systems were reviewed and are negative unless otherwise stated in the HPI.          Physical Exam: The patient in no distress.  Blood pressure 134/80, pulse 80 and regular, and weight 220 pounds the BMI of 33.58.  Abdominal exam shows no organomegaly, masses, tenderness, or distention.  Bowel sounds are normal.  Mental status is normal.   Assessment and Plan:SCAD syndrome doing well on daily Apriso 4 tablets a day which have asked him to continue long-term with a high fiber diet and liberal by mouth fluids as tolerated.  His GERD is well controlled with daily Protonix.  I see no need for followup endoscopy or colonoscopy at this time.  There certainly no evidence of cirrhosis on laboratory testing or  physical exam.  Patient has chronic fatty liver, negative previous workup for other metabolic causes of liver disease or hepatitis, and denies alcohol abuse.  I've asked continue all his other medications as listed and reviewed with GI followup in 6 months time or when necessary as needed.  Have asked him to avoid anticholinergic medications because of associated cardiac palpitations.   Please copy her primary care physician, referring physician, and pertinent subspecialists. No diagnosis found.

## 2012-10-24 NOTE — Patient Instructions (Addendum)
   Please make a follow up visit in six months with Dr. Jarold Motto.  Continue your Apriso.  ___________________________________________________________________________________________________________________                                               We are excited to introduce MyChart, a new best-in-class service that provides you online access to important information in your electronic medical record. We want to make it easier for you to view your health information - all in one secure location - when and where you need it. We expect MyChart will enhance the quality of care and service we provide.  When you register for MyChart, you can:    View your test results.    Request appointments and receive appointment reminders via email.    Request medication renewals.    View your medical history, allergies, medications and immunizations.    Communicate with your physician's office through a password-protected site.    Conveniently print information such as your medication lists.  To find out if MyChart is right for you, please talk to a member of our clinical staff today. We will gladly answer your questions about this free health and wellness tool.  If you are age 65 or older and want a member of your family to have access to your record, you must provide written consent by completing a proxy form available at our office. Please speak to our clinical staff about guidelines regarding accounts for patients younger than age 30.  As you activate your MyChart account and need any technical assistance, please call the MyChart technical support line at [phone]) 83-CHART [phone]) or email your question to [email or web address]. If you email your question(s), please include your name, a return phone number and the best time to reach you.  If you have non-urgent health-related questions, you can send a message to our office through MyChart at [email or web address]. If you have a  medical emergency, call 911.  Thank you for using MyChart as your new health and wellness resource!   MyChart licensed from [employer],  [id number]. Patents Pending.

## 2012-11-02 ENCOUNTER — Other Ambulatory Visit (HOSPITAL_BASED_OUTPATIENT_CLINIC_OR_DEPARTMENT_OTHER): Payer: 59 | Admitting: Lab

## 2012-11-02 DIAGNOSIS — D6861 Antiphospholipid syndrome: Secondary | ICD-10-CM

## 2012-11-02 DIAGNOSIS — C679 Malignant neoplasm of bladder, unspecified: Secondary | ICD-10-CM

## 2012-11-02 DIAGNOSIS — C61 Malignant neoplasm of prostate: Secondary | ICD-10-CM

## 2012-11-02 DIAGNOSIS — Z8546 Personal history of malignant neoplasm of prostate: Secondary | ICD-10-CM

## 2012-11-02 DIAGNOSIS — I2699 Other pulmonary embolism without acute cor pulmonale: Secondary | ICD-10-CM

## 2012-11-02 LAB — CBC WITH DIFFERENTIAL/PLATELET
Eosinophils Absolute: 0.1 10*3/uL (ref 0.0–0.5)
MONO#: 0.4 10*3/uL (ref 0.1–0.9)
NEUT#: 3 10*3/uL (ref 1.5–6.5)
Platelets: 171 10*3/uL (ref 140–400)
RBC: 5.07 10*6/uL (ref 4.20–5.82)
RDW: 13.9 % (ref 11.0–14.6)
WBC: 5.1 10*3/uL (ref 4.0–10.3)
lymph#: 1.6 10*3/uL (ref 0.9–3.3)

## 2012-11-02 LAB — COMPREHENSIVE METABOLIC PANEL (CC13)
AST: 29 U/L (ref 5–34)
Albumin: 4.2 g/dL (ref 3.5–5.0)
Alkaline Phosphatase: 81 U/L (ref 40–150)
BUN: 11.6 mg/dL (ref 7.0–26.0)
Glucose: 95 mg/dl (ref 70–99)
Potassium: 4 mEq/L (ref 3.5–5.1)
Sodium: 141 mEq/L (ref 136–145)
Total Bilirubin: 1.12 mg/dL (ref 0.20–1.20)
Total Protein: 7.1 g/dL (ref 6.4–8.3)

## 2012-11-05 LAB — LUPUS ANTICOAGULANT PANEL
DRVVT 1:1 Mix: 40.8 secs (ref <42.9)
DRVVT: 50.1 secs — ABNORMAL HIGH (ref <42.9)
PTT Lupus Anticoagulant: 46 secs — ABNORMAL HIGH (ref 28.0–43.0)

## 2012-11-05 LAB — BETA-2 GLYCOPROTEIN ANTIBODIES: Beta-2 Glyco I IgG: 0 G Units (ref <20)

## 2012-11-05 LAB — PSA: PSA: <0.01 ng/mL (ref <=4.00)

## 2012-11-09 ENCOUNTER — Ambulatory Visit: Payer: 59 | Admitting: Oncology

## 2012-11-23 ENCOUNTER — Ambulatory Visit: Payer: 59 | Admitting: Oncology

## 2012-11-27 ENCOUNTER — Ambulatory Visit (HOSPITAL_BASED_OUTPATIENT_CLINIC_OR_DEPARTMENT_OTHER): Payer: 59 | Admitting: Oncology

## 2012-11-27 ENCOUNTER — Telehealth: Payer: Self-pay | Admitting: Oncology

## 2012-11-27 VITALS — BP 141/78 | HR 72 | Temp 97.0°F | Resp 18 | Ht 68.0 in | Wt 217.8 lb

## 2012-11-27 DIAGNOSIS — Z8546 Personal history of malignant neoplasm of prostate: Secondary | ICD-10-CM

## 2012-11-27 DIAGNOSIS — Z86718 Personal history of other venous thrombosis and embolism: Secondary | ICD-10-CM

## 2012-11-27 DIAGNOSIS — I2699 Other pulmonary embolism without acute cor pulmonale: Secondary | ICD-10-CM

## 2012-11-27 DIAGNOSIS — C61 Malignant neoplasm of prostate: Secondary | ICD-10-CM

## 2012-11-27 DIAGNOSIS — D6861 Antiphospholipid syndrome: Secondary | ICD-10-CM

## 2012-11-27 NOTE — Telephone Encounter (Signed)
gv pt appt schedule for July.  

## 2012-11-27 NOTE — Progress Notes (Signed)
Hematology and Oncology Follow Up Visit  [name] [id number] 05/18/1946 67 y.o. 11/27/2012 6:31 PM   Principle Diagnosis: Encounter Diagnoses  Name Primary?  . PULMONARY EMBOLISM, HX OF Yes  . PROSTATE CANCER, HX OF   . Antiphospholipid antibody syndrome      Interim History:   Followup visit for this pleasant 67 year old man initially referred to this office for advice on definitive treatment of Gleason 3+3 prostate cancer diagnosed in February 2010. I felt in view of his young age that surgery would be his best option. He underwent robotic prostatectomy 10/20/2008. He was readmitted to the hospital on March 13 with an acute pulmonary embolus and a right lower extremity DVT. He was anticoagulated for 6 months.  In August of 2013, he presented  with a flu like illness with diffuse myalgias and fever over 102. He complained of dyspnea with no pleuritic chest pain, no cough, no sore throat, no hemoptysis. A CT angiogram of the chest on 04/19/2012 showed small subsegmental pulmonary emboli in the right lower lobe and he was put back on anticoagulation with Xarelto. He noted significant improvement in his breathing after resuming anticoagulation.  I saw him on October 7. Lab in anticipation of that visit returned positive for the presence of a lupus-type anticoagulant. He had elevated IgM antibodies against anticardiolipin and beta 2 glycoprotein 1.  I repeated the lab studies again in anticipation of today's visit. There has been a significant decrease in the beta-2 GP-1  antibodies from 50 units in October down to 27 units with normal range less than or equal to 20. The lupus-type anticoagulant is no longer detected.  He has no new symptoms at this time.    Medications: reviewed  Allergies:  Allergies  Allergen Reactions  . Aspirin     REACTION: gi upset with higher dose  . Iohexol      Code: HIVES, Desc: Mr. Burdell explained to me on 10/25/08 that someone misunderstood him  when he spoke about his hives reaction to IV dye.Mr. Lipton stated that he only had a reaction to MRI IV dye, not CT dye. Mr. Jaros also explained that he described his "feeling" he had, Onset Date: [id number]   Code: HIVES, Desc: (continued),,"feeling" he had was consistent with what I described as the "normal" feeling most people have during CT IV dye administration. Mr. Colocho stated he was fine and that the "breath"issue was his anxiousness and not actual dyspnea., Onset Date: [id number]   . Penicillins Itching    Review of Systems: Constitutional:   No constitutional symptoms Respiratory: No cough or dyspnea Cardiovascular:  No chest pain or palpitations Gastrointestinal: No change in bowel habit Genito-Urinary: No dysuria or frequency. He is using Viagra to maintain an erection Musculoskeletal: No muscle bone or joint pain other than chronic arthritis Neurologic: No headache or change in vision Skin: No rash or ecchymosis Remaining ROS negative.  Physical Exam: Blood pressure 141/78, pulse 72, temperature 97 F (36.1 C), temperature source Oral, resp. rate 18, height 5\' 8"  (1.727 m), weight 217 lb 12.8 oz (98.793 kg). Wt Readings from Last 3 Encounters:  11/27/12 217 lb 12.8 oz (98.793 kg)  10/24/12 220 lb 12.8 oz (100.154 kg)  09/21/12 220 lb 12.8 oz (100.154 kg)     General appearance: Well-nourished Caucasian man HENNT: Pharynx no erythema or exudate Lymph nodes: No adenopathy Breasts: Lungs: Clear to auscultation resonant to percussion Heart: Regular rhythm no murmur Abdomen: Soft, nontender Extremities: No edema, no calf tenderness  Vascular: No cyanosis Neurologic: Motor strength 5 over 5, reflexes 1+ symmetric Skin: No rash or ecchymosis  Lab Results: Lab Results  Component Value Date   WBC 5.1 11/02/2012   HGB 15.0 11/02/2012   HCT 45.0 11/02/2012   MCV 88.7 11/02/2012   PLT 171 11/02/2012     Chemistry      Component Value Date/Time   NA 141 11/02/2012 0737    NA 140 06/22/2011 1005   K 4.0 11/02/2012 0737   K 4.0 06/22/2011 1005   CL 103 11/02/2012 0737   CL 101 06/22/2011 1005   CO2 30* 11/02/2012 0737   CO2 31 06/22/2011 1005   BUN 11.6 11/02/2012 0737   BUN 18 06/22/2011 1005   CREATININE 1.2 11/02/2012 0737   CREATININE 1.1 06/22/2011 1005      Component Value Date/Time   CALCIUM 9.8 11/02/2012 0737   CALCIUM 9.3 06/22/2011 1005   ALKPHOS 81 11/02/2012 0737   ALKPHOS 55 06/22/2011 1005   AST 29 11/02/2012 0737   AST 32 06/22/2011 1005   ALT 44 11/02/2012 0737   ALT 38 06/22/2011 1005   BILITOT 1.12 11/02/2012 0737   BILITOT 0.9 06/22/2011 1005    PSA less than 0.01 done November 02, 2012  Impression and Plan: #1. Gleason 7 prostate cancer treated with prostatectomy. No evidence for new disease  #2. Recurrent pulmonary emboli It appears that both of these events were provoked. The first with surgery and  the second coincident with a severe viral infection. Lupus-type anticoagulant is no longer detected and antibodies to beta-2 glycoprotein levels have fallen to near normal values. I don't think that we are obligated to continue him on lifelong anticoagulation at this point. He is uncomfortable stopping the Xarelto. I will keep him on it for another 3 months and then repeat laboratory studies again. I think at that point he will have been on anticoagulation for one year after his second pulmonary embolus and that we can safely stop it and substitute with a baby aspirin.   CC:.    Levert Feinstein, MD 4/15/20146:31 PM

## 2013-02-19 ENCOUNTER — Other Ambulatory Visit: Payer: 59

## 2013-02-26 ENCOUNTER — Ambulatory Visit: Payer: 59 | Admitting: Oncology

## 2013-02-27 ENCOUNTER — Telehealth: Payer: Self-pay | Admitting: *Deleted

## 2013-02-27 NOTE — Telephone Encounter (Signed)
Pt called to rs/ his FTKA. gv appt for labs @ 8am for 10.24.14, and ov @ 12noon on 10.31.14. Pt is aware...td

## 2013-03-03 ENCOUNTER — Encounter: Payer: Self-pay | Admitting: Oncology

## 2013-03-03 NOTE — Progress Notes (Signed)
67 year old man status post prostatectomy March 2010, perioperative pulmonary embolus and right lower extremity DVT anticoagulated for 6 months, recurrent, small-volume, PE in August 2013 which occurred coincidentally with a viral illness with high fevers. He had a transient elevation of antiphospholipid antibodies which failed over time. He elected to stay on anticoagulation and was doing for a 3 month interval followup today. He failed to report for the visit. He will be rescheduled.

## 2013-06-07 ENCOUNTER — Encounter (INDEPENDENT_AMBULATORY_CARE_PROVIDER_SITE_OTHER): Payer: Self-pay

## 2013-06-07 ENCOUNTER — Other Ambulatory Visit (HOSPITAL_BASED_OUTPATIENT_CLINIC_OR_DEPARTMENT_OTHER): Payer: Medicare Other | Admitting: Lab

## 2013-06-07 DIAGNOSIS — D6861 Antiphospholipid syndrome: Secondary | ICD-10-CM

## 2013-06-07 DIAGNOSIS — Z86718 Personal history of other venous thrombosis and embolism: Secondary | ICD-10-CM

## 2013-06-07 DIAGNOSIS — C61 Malignant neoplasm of prostate: Secondary | ICD-10-CM

## 2013-06-07 LAB — CBC WITH DIFFERENTIAL/PLATELET
BASO%: 0.4 % (ref 0.0–2.0)
Basophils Absolute: 0 10*3/uL (ref 0.0–0.1)
EOS%: 2 % (ref 0.0–7.0)
HCT: 42.1 % (ref 38.4–49.9)
HGB: 14.4 g/dL (ref 13.0–17.1)
MCH: 30.4 pg (ref 27.2–33.4)
MCHC: 34.2 g/dL (ref 32.0–36.0)
MONO#: 0.4 10*3/uL (ref 0.1–0.9)
MONO%: 8.9 % (ref 0.0–14.0)
NEUT#: 2.7 10*3/uL (ref 1.5–6.5)
Platelets: 144 10*3/uL (ref 140–400)
RBC: 4.74 10*6/uL (ref 4.20–5.82)
RDW: 14.1 % (ref 11.0–14.6)
WBC: 4.9 10*3/uL (ref 4.0–10.3)
lymph#: 1.6 10*3/uL (ref 0.9–3.3)

## 2013-06-09 ENCOUNTER — Emergency Department (HOSPITAL_BASED_OUTPATIENT_CLINIC_OR_DEPARTMENT_OTHER): Payer: Medicare Other

## 2013-06-09 ENCOUNTER — Encounter (HOSPITAL_BASED_OUTPATIENT_CLINIC_OR_DEPARTMENT_OTHER): Payer: Self-pay | Admitting: Emergency Medicine

## 2013-06-09 ENCOUNTER — Emergency Department (HOSPITAL_BASED_OUTPATIENT_CLINIC_OR_DEPARTMENT_OTHER)
Admission: EM | Admit: 2013-06-09 | Discharge: 2013-06-09 | Disposition: A | Discharge status: Home / Self Care | Payer: Medicare Other | Attending: Emergency Medicine | Admitting: Emergency Medicine

## 2013-06-09 DIAGNOSIS — Y92410 Unspecified street and highway as the place of occurrence of the external cause: Secondary | ICD-10-CM | POA: Insufficient documentation

## 2013-06-09 DIAGNOSIS — S0990XA Unspecified injury of head, initial encounter: Secondary | ICD-10-CM

## 2013-06-09 DIAGNOSIS — Z8669 Personal history of other diseases of the nervous system and sense organs: Secondary | ICD-10-CM | POA: Insufficient documentation

## 2013-06-09 DIAGNOSIS — Z86711 Personal history of pulmonary embolism: Secondary | ICD-10-CM | POA: Insufficient documentation

## 2013-06-09 DIAGNOSIS — K222 Esophageal obstruction: Secondary | ICD-10-CM | POA: Insufficient documentation

## 2013-06-09 DIAGNOSIS — S161XXA Strain of muscle, fascia and tendon at neck level, initial encounter: Secondary | ICD-10-CM

## 2013-06-09 DIAGNOSIS — F329 Major depressive disorder, single episode, unspecified: Secondary | ICD-10-CM | POA: Insufficient documentation

## 2013-06-09 DIAGNOSIS — E785 Hyperlipidemia, unspecified: Secondary | ICD-10-CM | POA: Insufficient documentation

## 2013-06-09 DIAGNOSIS — I10 Essential (primary) hypertension: Secondary | ICD-10-CM | POA: Insufficient documentation

## 2013-06-09 DIAGNOSIS — Z8546 Personal history of malignant neoplasm of prostate: Secondary | ICD-10-CM | POA: Insufficient documentation

## 2013-06-09 DIAGNOSIS — Z79899 Other long term (current) drug therapy: Secondary | ICD-10-CM | POA: Insufficient documentation

## 2013-06-09 DIAGNOSIS — G4730 Sleep apnea, unspecified: Secondary | ICD-10-CM | POA: Insufficient documentation

## 2013-06-09 DIAGNOSIS — K219 Gastro-esophageal reflux disease without esophagitis: Secondary | ICD-10-CM | POA: Insufficient documentation

## 2013-06-09 DIAGNOSIS — S139XXA Sprain of joints and ligaments of unspecified parts of neck, initial encounter: Secondary | ICD-10-CM | POA: Insufficient documentation

## 2013-06-09 DIAGNOSIS — Z86718 Personal history of other venous thrombosis and embolism: Secondary | ICD-10-CM | POA: Insufficient documentation

## 2013-06-09 DIAGNOSIS — Z862 Personal history of diseases of the blood and blood-forming organs and certain disorders involving the immune mechanism: Secondary | ICD-10-CM | POA: Insufficient documentation

## 2013-06-09 DIAGNOSIS — Z880 Allergy status to penicillin: Secondary | ICD-10-CM | POA: Insufficient documentation

## 2013-06-09 DIAGNOSIS — F17200 Nicotine dependence, unspecified, uncomplicated: Secondary | ICD-10-CM | POA: Insufficient documentation

## 2013-06-09 DIAGNOSIS — Z7901 Long term (current) use of anticoagulants: Secondary | ICD-10-CM | POA: Insufficient documentation

## 2013-06-09 DIAGNOSIS — F3289 Other specified depressive episodes: Secondary | ICD-10-CM | POA: Insufficient documentation

## 2013-06-09 DIAGNOSIS — Y9389 Activity, other specified: Secondary | ICD-10-CM | POA: Insufficient documentation

## 2013-06-09 MED ORDER — OXYCODONE-ACETAMINOPHEN 5-325 MG PO TABS
2.0000 | ORAL_TABLET | Freq: Once | ORAL | Status: AC
  Administered 2013-06-09: 2 via ORAL
  Filled 2013-06-09: qty 2

## 2013-06-09 MED ORDER — HYDROCODONE-ACETAMINOPHEN 5-325 MG PO TABS: 1.0000 | ORAL_TABLET | ORAL | Status: DC | PRN
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
## 2013-06-09 NOTE — ED Notes (Signed)
Pt placed in cervical collar
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
## 2013-06-10 LAB — D-DIMER, QUANTITATIVE (NOT AT ARMC): D-Dimer, Quant: 0.27 ug/mL-FEU (ref 0.00–0.48)

## 2013-06-10 LAB — LUPUS ANTICOAGULANT PANEL: PTT Lupus Anticoagulant: 37.4 secs (ref 28.0–43.0)

## 2013-06-10 LAB — BETA-2 GLYCOPROTEIN ANTIBODIES
Beta-2 Glyco I IgG: 3 G Units (ref <20)
Beta-2-Glycoprotein I IgA: 0 A Units (ref <20)
Beta-2-Glycoprotein I IgM: 22 M Units — ABNORMAL HIGH (ref <20)

## 2013-06-14 ENCOUNTER — Ambulatory Visit (HOSPITAL_BASED_OUTPATIENT_CLINIC_OR_DEPARTMENT_OTHER): Payer: Medicare Other | Admitting: Oncology

## 2013-06-14 ENCOUNTER — Telehealth: Payer: Self-pay | Admitting: Oncology

## 2013-06-14 VITALS — BP 136/84 | HR 81 | Temp 97.9°F | Resp 18 | Ht 71.0 in | Wt 220.9 lb

## 2013-06-14 DIAGNOSIS — D6861 Antiphospholipid syndrome: Secondary | ICD-10-CM

## 2013-06-14 DIAGNOSIS — Z86711 Personal history of pulmonary embolism: Secondary | ICD-10-CM

## 2013-06-14 DIAGNOSIS — Z86718 Personal history of other venous thrombosis and embolism: Secondary | ICD-10-CM

## 2013-06-14 DIAGNOSIS — C61 Malignant neoplasm of prostate: Secondary | ICD-10-CM

## 2013-06-14 DIAGNOSIS — Z8546 Personal history of malignant neoplasm of prostate: Secondary | ICD-10-CM

## 2013-06-14 DIAGNOSIS — C679 Malignant neoplasm of bladder, unspecified: Secondary | ICD-10-CM

## 2013-06-14 NOTE — Telephone Encounter (Signed)
Gave pt appt for lab and MD october 2015, scheduled  to Dr. Clelia Croft former Dr. Reece Agar
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
## 2013-10-16 ENCOUNTER — Other Ambulatory Visit: Payer: Self-pay | Admitting: Cardiovascular Disease

## 2013-10-30 ENCOUNTER — Other Ambulatory Visit: Payer: Self-pay | Admitting: *Deleted

## 2013-10-30 MED ORDER — METOPROLOL SUCCINATE ER 100 MG PO TB24: ORAL_TABLET | ORAL | Status: DC

## 2013-10-30 MED ORDER — RAMIPRIL 10 MG PO CAPS: 10.0000 mg | ORAL_CAPSULE | Freq: Every day | ORAL | Status: DC

## 2013-11-12 ENCOUNTER — Other Ambulatory Visit: Payer: Self-pay | Admitting: *Deleted

## 2013-11-12 MED ORDER — ROSUVASTATIN CALCIUM 5 MG PO TABS: 5.0000 mg | ORAL_TABLET | Freq: Every day | ORAL | Status: DC

## 2013-11-12 NOTE — Telephone Encounter (Signed)
Rx was sent to pharmacy electronically. Last OV 10/2012 

## 2013-11-15 ENCOUNTER — Other Ambulatory Visit: Payer: Self-pay | Admitting: *Deleted

## 2013-11-15 MED ORDER — RAMIPRIL 10 MG PO CAPS: 10.0000 mg | ORAL_CAPSULE | Freq: Every day | ORAL | Status: DC

## 2013-11-18 ENCOUNTER — Other Ambulatory Visit: Payer: Self-pay

## 2013-11-18 MED ORDER — METOPROLOL SUCCINATE ER 100 MG PO TB24: ORAL_TABLET | ORAL | Status: DC

## 2013-11-18 NOTE — Telephone Encounter (Signed)
Rx was sent to pharmacy electronically. 

## 2013-11-19 ENCOUNTER — Telehealth: Payer: Self-pay | Admitting: Cardiovascular Disease

## 2013-11-19 ENCOUNTER — Other Ambulatory Visit: Payer: Self-pay | Admitting: *Deleted

## 2013-11-19 MED ORDER — RAMIPRIL 10 MG PO CAPS: 10.0000 mg | ORAL_CAPSULE | Freq: Two times a day (BID) | ORAL | Status: DC

## 2013-11-19 NOTE — Telephone Encounter (Signed)
Ramipril 10mg  refilled x 30 days w/0 refills.  Has appt early May to see Dr. Gwenlyn Found.

## 2013-11-19 NOTE — Telephone Encounter (Signed)
Please call,concerning his Ramipril.

## 2013-12-17 ENCOUNTER — Ambulatory Visit (INDEPENDENT_AMBULATORY_CARE_PROVIDER_SITE_OTHER): Payer: Medicare Other | Admitting: Cardiovascular Disease

## 2013-12-17 ENCOUNTER — Encounter: Payer: Self-pay | Admitting: Cardiovascular Disease

## 2013-12-17 VITALS — BP 126/80 | HR 66 | Ht 71.0 in | Wt 220.0 lb

## 2013-12-17 DIAGNOSIS — I2699 Other pulmonary embolism without acute cor pulmonale: Secondary | ICD-10-CM

## 2013-12-17 DIAGNOSIS — E785 Hyperlipidemia, unspecified: Secondary | ICD-10-CM

## 2013-12-17 DIAGNOSIS — R079 Chest pain, unspecified: Secondary | ICD-10-CM

## 2013-12-17 DIAGNOSIS — I119 Hypertensive heart disease without heart failure: Secondary | ICD-10-CM

## 2013-12-17 MED ORDER — ROSUVASTATIN CALCIUM 5 MG PO TABS: 5.0000 mg | ORAL_TABLET | Freq: Every day | ORAL | Status: DC

## 2013-12-17 MED ORDER — RAMIPRIL 10 MG PO CAPS: 10.0000 mg | ORAL_CAPSULE | Freq: Two times a day (BID) | ORAL | Status: DC

## 2013-12-17 MED ORDER — METOPROLOL SUCCINATE ER 100 MG PO TB24: ORAL_TABLET | ORAL | Status: DC

## 2013-12-17 NOTE — Patient Instructions (Signed)
Dr Gwenlyn Found wants you to follow-up in 1 year. You will receive a reminder letter in the mail one months in advance. If you don't receive a letter, please call our office to schedule the follow-up appointment.

## 2013-12-17 NOTE — Assessment & Plan Note (Signed)
On oral anticoagulation

## 2013-12-17 NOTE — Assessment & Plan Note (Signed)
On statin therapy with recent lipid profile performed 07/30/13 revealed a total cholesterol of 53, LDL of 89 and HDL of 46

## 2013-12-17 NOTE — Progress Notes (Signed)
12/17/2013 [name]   02-12-1946  [id number]  Primary Physician Andrew Pel, MD Primary Cardiologist: Andrew Harp MD [name]  .  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 I last saw him 6 months ago. He has a history of remote tobacco abuse, having smoked cigars in the past, treated hypertension, dyslipidemia. He has never had a heart attack or stroke. He has had bladder and prostate cancer, and he has had prostate surgery. He is followed by Dr. Algis Ford and has been evaluated by Dr. Liam Ford in the past with a Myoview stress test that was normal and an event monitor that showed PVCs for palpitations. He does drink 2-4 cups of coffee a day and has had palpitations since he was a teenager. He saw Dr. Shelia Ford complaining of right-sided chest pain and shortness of breath. He had a CT scan done April 19, 2012 that showed 2 small subsegmental right-sided pulmonary emboli of unclear origin. There is no evidence of DVT. He was placed on Xarelto. Dr. Beryle Ford did a hypercoagulable workup which apparently, according to the patient, was positive for lupus anticoagulant. His most recent lipid profile revealed a total cholesterol of153, LDL of 89 and HDL 46.  Current Outpatient Prescriptions  Medication Sig Dispense Refill  . co-enzyme Q-10 30 MG capsule Take 30 mg by mouth daily.        . diazepam (VALIUM) 5 MG tablet Take 5 mg by mouth 3 (three) times daily.       . fish oil-omega-3 fatty acids 1000 MG capsule Take 1 g by mouth daily.       . Flaxseed, Linseed, (FLAXSEED OIL PO) Take 1 capsule by mouth daily.      Marland Kitchen FLUoxetine (PROZAC) 20 MG tablet Take 20 mg by mouth. Alternate one tablet with two tablets every other day      . furosemide (LASIX) 40 MG tablet Take 40 mg by mouth. Three times a  week      . Hypertonic Nasal Wash (SINUS RINSE BOTTLE KIT) PACK Place into the nose at bedtime.        . lidocaine-hydrocortisone (ANAMANTEL HC) 3-0.5 % CREA USE PER RECTUM AS NEEDED  98 g  0  . methylcellulose (ARTIFICIAL TEARS) 1 % ophthalmic solution Place 2 drops into both eyes daily.        . metoprolol succinate (TOPROL-XL) 100 MG 24 hr tablet 1 & 1/2 TABLETS BY MOUTH ONCE DAILY  45 tablet  11  . Multiple Vitamin (MULTIVITAMIN) tablet Take 1 tablet by mouth daily.        . naproxen sodium (ANAPROX) 220 MG tablet Take 440 mg by mouth as needed. Joint pain       . NON FORMULARY CPAP at hs       . oxyCODONE-acetaminophen (PERCOCET) 5-325 MG per tablet Take 1 tablet by mouth as needed. pain      . oxymetazoline (AFRIN) 0.05 % nasal spray Place 2 sprays into the nose 2 (two) times daily.        . pantoprazole (PROTONIX) 40 MG tablet Take 40 mg by mouth daily.      . potassium chloride (K-DUR,KLOR-CON) 10 MEQ tablet Take 10 mEq by mouth. Three times a week, takes along with the lasix      . pramoxine-hydrocortisone (PROCTOCREAM-HC) 1-1 % rectal cream APPLY  TO AFFECTED AREA 3 TIMES DAILY  30 g  1  . ramipril (ALTACE) 10 MG capsule Take 1 capsule (10 mg total) by mouth 2 (two) times daily.  60 capsule  11  . Rivaroxaban (XARELTO) 20 MG TABS Take 20 mg by mouth daily.      Marland Kitchen rOPINIRole (REQUIP) 1 MG tablet 1 mg at bedtime.       . rosuvastatin (CRESTOR) 5 MG tablet Take 1 tablet (5 mg total) by mouth daily.  30 tablet  11  . sildenafil (VIAGRA) 50 MG tablet Take 100 mg by mouth daily as needed.        No current facility-administered medications for this visit.    Allergies  Allergen Reactions  . Aspirin     REACTION: gi upset with higher dose  . Iohexol      Code: HIVES, Desc: [name] explained to me on 10/25/08 that someone misunderstood him when he spoke about his hives reaction to IV dye.Mr. Corpus stated that he only had a reaction to MRI IV dye, not CT dye. [name] also explained  that he described his "feeling" he had, Onset Date: [id number]   Code: HIVES, Desc: (continued),,"feeling" he had was consistent with what I described as the "normal" feeling most people have during CT IV dye administration. [name] stated he was fine and that the "breath"issue was his anxiousness and not actual dyspnea., Onset Date: [id number]   . Penicillins Itching    History   Social History  . Marital Status: Married    Spouse Name: N/A    Number of Children: 2  . Years of Education: N/A   Occupational History  . Art gallery manager   . [employer]    Social History Main Topics  . Smoking status: Current Some Day Smoker    Types: Cigars  . Smokeless tobacco: Never Used     Comment: 5 cigars /week--form given 10-24-12  . Alcohol Use: 8.4 oz/week    14 Glasses of wine per week     Comment: 2 glasses wine/day  . Drug Use: No  . Sexual Activity: Not on file   Other Topics Concern  . Not on file   Social History Narrative   Gets regular exercise.   Daily caffeine- 2 cups daily.   Education: [employer]     Review of Systems: General: negative for chills, fever, night sweats or weight changes.  Cardiovascular: negative for chest pain, dyspnea on exertion, edema, orthopnea, palpitations, paroxysmal nocturnal dyspnea or shortness of breath Dermatological: negative for rash Respiratory: negative for cough or wheezing Urologic: negative for hematuria Abdominal: negative for nausea, vomiting, diarrhea, bright red blood per rectum, melena, or hematemesis Neurologic: negative for visual changes, syncope, or dizziness All other systems reviewed and are otherwise negative except as noted above.    Blood pressure 126/80, pulse 66, height 5' 11"  (1.803 m), weight 220 lb (99.791 kg).  General appearance: alert and no distress Neck: no adenopathy, no carotid bruit, no JVD, supple, symmetrical, trachea midline and thyroid not enlarged, symmetric, no tenderness/mass/nodules Lungs:  clear to auscultation bilaterally Heart: regular rate and rhythm, S1, S2 normal, no murmur, click, rub or gallop Extremities: extremities normal, atraumatic, no cyanosis or edema  EKG normal sinus rhythm at 66 without ST or T wave changes  ASSESSMENT AND PLAN:   HYPERLIPIDEMIA On statin therapy with recent lipid profile performed 07/30/13 revealed a total cholesterol of 53, LDL of 89 and HDL of 46  HYPERTENSIVE CARDIOVASCULAR DISEASE  Well-controlled on current medications  Pulmonary embolus On oral anticoagulation      Andrew Harp MD [facility] 12/17/2013 11:36 AM

## 2013-12-17 NOTE — Assessment & Plan Note (Signed)
Well-controlled on current medications 

## 2014-03-31 ENCOUNTER — Ambulatory Visit (INDEPENDENT_AMBULATORY_CARE_PROVIDER_SITE_OTHER): Payer: Medicare Other | Admitting: Internal Medicine

## 2014-03-31 ENCOUNTER — Encounter: Payer: Self-pay | Admitting: Internal Medicine

## 2014-03-31 VITALS — BP 160/60 | HR 80 | Ht 70.5 in | Wt 227.1 lb

## 2014-03-31 DIAGNOSIS — K601 Chronic anal fissure: Secondary | ICD-10-CM

## 2014-03-31 DIAGNOSIS — K641 Second degree hemorrhoids: Secondary | ICD-10-CM | POA: Insufficient documentation

## 2014-03-31 DIAGNOSIS — K589 Irritable bowel syndrome without diarrhea: Secondary | ICD-10-CM

## 2014-03-31 DIAGNOSIS — R109 Unspecified abdominal pain: Secondary | ICD-10-CM

## 2014-03-31 DIAGNOSIS — R1010 Upper abdominal pain, unspecified: Secondary | ICD-10-CM | POA: Insufficient documentation

## 2014-03-31 DIAGNOSIS — K602 Anal fissure, unspecified: Secondary | ICD-10-CM

## 2014-03-31 DIAGNOSIS — R1310 Dysphagia, unspecified: Secondary | ICD-10-CM | POA: Insufficient documentation

## 2014-03-31 DIAGNOSIS — K648 Other hemorrhoids: Secondary | ICD-10-CM

## 2014-03-31 HISTORY — DX: Second degree hemorrhoids: K64.1

## 2014-03-31 MED ORDER — DILTIAZEM GEL 2 %: 1.0000 "application " | Freq: Two times a day (BID) | CUTANEOUS | Status: DC

## 2014-03-31 NOTE — Patient Instructions (Addendum)
You have been given a separate informational sheet regarding your tobacco use, the importance of quitting and local resources to help you quit.  Switch from metamucil to benefiber, handout provided.  Today you have been given a handout on gas prevention.  Please start a probiotic , coupon given for Align.  Take one a day to put the good bacteria back in your colon.  An rx has been sent to [facility] ([place]) for you to pick up for Diltiazem Gel.  You have been scheduled for a Barium Esophogram at [facility] (1st floor of the hospital) on8/21/15 at 10:30 am. Please arrive 15 minutes prior to your appointment for registration. Make certain not to have anything to eat or drink 3 hours prior to your test. If you need to reschedule for any reason, please contact radiology at [phone] to do so. __________________________________________________________________ A barium swallow is an examination that concentrates on views of the esophagus. This tends to be a double contrast exam (barium and two liquids which, when combined, create a gas to distend the wall of the oesophagus) or single contrast (non-ionic iodine based). The study is usually tailored to your symptoms so a good history is essential. Attention is paid during the study to the form, structure and configuration of the esophagus, looking for functional disorders (such as aspiration, dysphagia, achalasia, motility and reflux) EXAMINATION You may be asked to change into a gown, depending on the type of swallow being performed. A radiologist and radiographer will perform the procedure. The radiologist will advise you of the type of contrast selected for your procedure and direct you during the exam. You will be asked to stand, sit or lie in several different positions and to hold a small amount of fluid in your mouth before being asked to swallow while the imaging is performed .In some instances you may be asked to swallow barium coated  marshmallows to assess the motility of a solid food bolus. The exam can be recorded as a digital or video fluoroscopy procedure. POST PROCEDURE It will take 1-2 days for the barium to pass through your system. To facilitate this, it is important, unless otherwise directed, to increase your fluids for the next 24-48hrs and to resume your normal diet.  This test typically takes about 30 minutes to perform. _______________________________________________________________________

## 2014-03-31 NOTE — Assessment & Plan Note (Signed)
See what happens with Tx of fissure Banding an option but pain bigger c/o and not usually from hemorrhoids unless thrombosed and his are not

## 2014-03-31 NOTE — Assessment & Plan Note (Signed)
Check barium swallow- will give info re: motility

## 2014-03-31 NOTE — Assessment & Plan Note (Signed)
Probiotic Gas and flatulence reduction diet RTC 2 months

## 2014-03-31 NOTE — Assessment & Plan Note (Signed)
Probiotic Gas and flatulence reduction diet Duodenal bxs negative 2008 (not celiac) RTC 2 months

## 2014-03-31 NOTE — Progress Notes (Signed)
Subjective:    Patient ID: [name], male    DOB: February 07, 1946, 68 y.o.   MRN: [id number]  HPI  The patient is here for evaluation of several chronic complaints.  Rectal pain with sitting and defecation - some minor rectal bleeding - known hemorrhoids from 2013 colonoscpy and before. Uses Procto-sol. Some relief.  Bloated LUQ/RUQ into back  Constipated, moving bowels better with metamucil.  Tight in esophagus at times - prior dilation - when swallows no pain, not an easy swallow - feels tight Gets Right infraclavicular pain with swallowing also. Chronic and recurrent.  Allergies  Allergen Reactions  . Aspirin     REACTION: gi upset with higher dose  . Iohexol      Code: HIVES, Desc: [name] explained to me on 10/25/08 that someone misunderstood him when he spoke about his hives reaction to IV dye.[name] stated that he only had a reaction to MRI IV dye, not CT dye. [name] also explained that he described his "feeling" he had, Onset Date: [id number]   Code: HIVES, Desc: (continued),,"feeling" he had was consistent with what I described as the "normal" feeling most people have during CT IV dye administration. [name] stated he was fine and that the "breath"issue was his anxiousness and not actual dyspnea., Onset Date: [id number]   . Penicillins Itching   Outpatient Prescriptions Prior to Visit  Medication Sig Dispense Refill  . co-enzyme Q-10 30 MG capsule Take 30 mg by mouth daily.        . fish oil-omega-3 fatty acids 1000 MG capsule Take 1 g by mouth daily.       . Flaxseed, Linseed, (FLAXSEED OIL PO) Take 1 capsule by mouth daily.      Marland Kitchen FLUoxetine (PROZAC) 20 MG tablet Take 40 mg by mouth daily.       . furosemide (LASIX) 40 MG tablet Take 40 mg by mouth. Three times a week      . Hypertonic Nasal Wash (SINUS RINSE BOTTLE KIT) PACK Place into the nose at bedtime.        . lidocaine-hydrocortisone (ANAMANTEL HC) 3-0.5 % CREA USE PER RECTUM AS NEEDED  98 g  0  .  methylcellulose (ARTIFICIAL TEARS) 1 % ophthalmic solution Place 2 drops into both eyes daily.        . metoprolol succinate (TOPROL-XL) 100 MG 24 hr tablet 1 & 1/2 TABLETS BY MOUTH ONCE DAILY  45 tablet  11  . Multiple Vitamin (MULTIVITAMIN) tablet Take 1 tablet by mouth daily.        . naproxen sodium (ANAPROX) 220 MG tablet Take 440 mg by mouth as needed. Joint pain       . NON FORMULARY CPAP at hs       . oxyCODONE-acetaminophen (PERCOCET) 5-325 MG per tablet Take 1 tablet by mouth as needed. pain      . oxymetazoline (AFRIN) 0.05 % nasal spray Place 2 sprays into the nose 2 (two) times daily.        . pantoprazole (PROTONIX) 40 MG tablet Take 40 mg by mouth daily.      . potassium chloride (K-DUR,KLOR-CON) 10 MEQ tablet Take 10 mEq by mouth. Three times a week, takes along with the lasix      . pramoxine-hydrocortisone (PROCTOCREAM-HC) 1-1 % rectal cream APPLY TO AFFECTED AREA 3 TIMES DAILY  30 g  1  . ramipril (ALTACE) 10 MG capsule Take 1 capsule (10 mg total) by mouth 2 (two)  times daily.  60 capsule  11  . Rivaroxaban (XARELTO) 20 MG TABS Take 20 mg by mouth daily.      Marland Kitchen rOPINIRole (REQUIP) 1 MG tablet 1 mg at bedtime.       . rosuvastatin (CRESTOR) 5 MG tablet Take 1 tablet (5 mg total) by mouth daily.  30 tablet  11  . sildenafil (VIAGRA) 50 MG tablet Take 100 mg by mouth daily as needed.       . diazepam (VALIUM) 5 MG tablet Take 5 mg by mouth 3 (three) times daily.        No facility-administered medications prior to visit.   Past Medical History  Diagnosis Date  . HTN (hypertension)   . GERD (gastroesophageal reflux disease)   . Prostate cancer   . DVT (deep venous thrombosis)     secondary to prostate surgery  . Chest pain   . Aortic dilatation   . Pulmonary embolism 2011  . History of echocardiogram 10/30/2009    EF 55-60%  . Tricuspid regurgitation 02/08/2008    trace  . Mitral regurgitation 10/30/2009    trace  . Apnea, sleep     uses cpap at hs  . Hyperlipidemia    . Hiatal hernia   . Esophageal stricture   . Depression   . Cataract   . PVCs (premature ventricular contractions)   . Pulmonary embolus 11/11/2011    10/25/08 5 days post prostatectomy for prostate ca  . DVT, lower extremity, distal 11/11/2011    RLE DVT & PE 5 days post prostatectomy 10/25/08  . Antiphospholipid antibody syndrome 05/22/2012    Found subsequent to recurrent PE 9/13  . Fatty liver   . Mild aortic insufficiency   . Chronic anxiety   . Lupus anticoagulant positive   . ED (erectile dysfunction)   . RLS (restless legs syndrome)   . Obesity   . History of TMJ syndrome    Past Surgical History  Procedure Laterality Date  . Prostate surgery    . Tonsillectomy    . Appendectomy    . Eye surgery Bilateral   . Bladder surgery    . Cholecystectomy    . Esophagogastroduodenoscopy    . Colonoscopy     History   Social History  . Marital Status: Married    Spouse Name: N/A    Number of Children: 2  .     Occupational History  . Art gallery manager   . FIELD APPLICATIONS    Social History Main Topics  . Smoking status: Current Some Day Smoker    Types: Cigars  . Smokeless tobacco: Never Used     Comment: 5 cigars /week--form given 10-24-12  . Alcohol Use: 8.4 oz/week    14 Glasses of wine per week     Comment: 2 glasses wine/day  . Drug Use: No   Social History Narrative   Gets regular exercise.   Daily caffeine- 2 cups daily.   Education: [employer]     Review of Systems Mild urinary incontinence post prostatectomy.    Objective:   Physical Exam General:  NAD Eyes:   anicteric Lungs:  clear Heart:  S1S2 no rubs, murmurs or gallops Abdomen:  soft and nontender, BS+ Ext:   no edema  Rectal shows mild perianal erythema Increased resting tone, posterior tenderness and thickening No prostate, no mass   Anoscopy was performed with the patient in the left lateral decubitus position and revealed Grade 1 internal hemorrhoids all positions and a  posterior  anal fissure   Data Reviewed:   prior GI notes, procedures [name])    Assessment & Plan:  Chronic anal fissure - posterior Treat with fiber and diltiazem gel 2% bid RTC 2 months  Hemorrhoids, internal, with bleeding See what happens with Tx of fissure Banding an option but pain bigger c/o and not usually from hemorrhoids unless thrombosed and his are not  Dysphagia, unspecified(787.20) Check barium swallow- will give info re: motility  Upper abdominal pain - chronic Probiotic Gas and flatulence reduction diet RTC 2 months  IBS (irritable bowel syndrome) Probiotic Gas and flatulence reduction diet Duodenal bxs negative 2008 (not celiac) RTC 2 months   I appreciate the opportunity to care for this patient.  FF:MBWGY,KZLDJT DAVIDSON, MD

## 2014-03-31 NOTE — Assessment & Plan Note (Signed)
Treat with fiber and diltiazem gel 2% bid RTC 2 months

## 2014-04-04 ENCOUNTER — Ambulatory Visit (HOSPITAL_COMMUNITY)
Admission: RE | Admit: 2014-04-04 | Discharge: 2014-04-04 | Disposition: A | Discharge status: Home / Self Care | Payer: Medicare Other | Source: Ambulatory Visit | Attending: Internal Medicine | Admitting: Internal Medicine

## 2014-04-04 DIAGNOSIS — R1310 Dysphagia, unspecified: Secondary | ICD-10-CM

## 2014-04-04 DIAGNOSIS — K219 Gastro-esophageal reflux disease without esophagitis: Secondary | ICD-10-CM | POA: Insufficient documentation

## 2014-04-07 NOTE — Progress Notes (Signed)
Quick Note:  Results notification done by My chart ______

## 2014-05-27 ENCOUNTER — Other Ambulatory Visit: Payer: Self-pay | Admitting: Internal Medicine

## 2014-05-30 ENCOUNTER — Other Ambulatory Visit: Payer: Self-pay

## 2014-06-03 ENCOUNTER — Ambulatory Visit (INDEPENDENT_AMBULATORY_CARE_PROVIDER_SITE_OTHER): Payer: Medicare Other | Admitting: Internal Medicine

## 2014-06-03 ENCOUNTER — Encounter: Payer: Self-pay | Admitting: Internal Medicine

## 2014-06-03 VITALS — BP 156/80 | HR 76 | Ht 70.5 in | Wt 228.5 lb

## 2014-06-03 DIAGNOSIS — R1310 Dysphagia, unspecified: Secondary | ICD-10-CM

## 2014-06-03 DIAGNOSIS — K601 Chronic anal fissure: Secondary | ICD-10-CM

## 2014-06-03 DIAGNOSIS — K648 Other hemorrhoids: Secondary | ICD-10-CM

## 2014-06-03 DIAGNOSIS — B356 Tinea cruris: Secondary | ICD-10-CM

## 2014-06-03 DIAGNOSIS — L309 Dermatitis, unspecified: Secondary | ICD-10-CM

## 2014-06-03 MED ORDER — NYSTATIN 100000 UNIT/GM EX CREA: 1.0000 "application " | TOPICAL_CREAM | Freq: Two times a day (BID) | CUTANEOUS | Status: DC

## 2014-06-03 NOTE — Progress Notes (Signed)
   Subjective:    Patient ID: [name], male    DOB: 1946-07-15, 68 y.o.   MRN: [id number]  HPI  Here for f/u several problems and new c/o.  Anal fissure - much better and less pain with diltiazem "manageble" Less rectal bleeding from this and hemorrhoids  Having jock itch that spread to perianal area - tried Lotrimin spray - no help Does sweat a fair amount - wears depends  Had hives yesterday after stressful event   No more pain after swallowing  Medications, allergies, past medical history, past surgical history, family history and social history are reviewed and updated in the EMR.  Review of Systems As above    Objective:   Physical Exam NAD Scrotum erythematous + perianal erythema Rectal inspection shows no tags or external signs of fissure    Assessment & Plan:  Chronic anal fissure - posterior improved  Hemorrhoids, internal, with bleeding Stable   Jock itch Nystatin-triamcinolone cream and then Zeasorb AF  Perianal dermatitis Nystatin-Triamcinolone cream and then Zeasorb AF  Dysphagia Resolved Negative barium swallow   RTC Jan 2016  I appreciate the opportunity to care for this patient. EC:XFQHK,UVJDYN DAVIDSON, MD

## 2014-06-03 NOTE — Assessment & Plan Note (Signed)
Nystatin-triamcinolone cream and then Zeasorb AF

## 2014-06-03 NOTE — Assessment & Plan Note (Signed)
Nystatin-Triamcinolone cream and then Zeasorb AF

## 2014-06-03 NOTE — Assessment & Plan Note (Signed)
improved

## 2014-06-03 NOTE — Assessment & Plan Note (Signed)
Resolved Negative barium swallow

## 2014-06-03 NOTE — Assessment & Plan Note (Signed)
Stable

## 2014-06-03 NOTE — Patient Instructions (Addendum)
Please purchase RectiCare and use as needed.   Use Zeasorb AF powder as Dr. Carlean Purl discussed.   We have sent the following medications to your pharmacy for you to pick up at your convenience: Proctosol cream  Follow up with [place] in 3 months.    I appreciate the opportunity to care for you.

## 2014-06-06 ENCOUNTER — Other Ambulatory Visit (HOSPITAL_BASED_OUTPATIENT_CLINIC_OR_DEPARTMENT_OTHER): Payer: Medicare Other

## 2014-06-06 DIAGNOSIS — C679 Malignant neoplasm of bladder, unspecified: Secondary | ICD-10-CM

## 2014-06-06 DIAGNOSIS — Z8546 Personal history of malignant neoplasm of prostate: Secondary | ICD-10-CM

## 2014-06-06 DIAGNOSIS — C61 Malignant neoplasm of prostate: Secondary | ICD-10-CM

## 2014-06-06 LAB — COMPREHENSIVE METABOLIC PANEL (CC13)
ALK PHOS: 72 U/L (ref 40–150)
ALT: 51 U/L (ref 0–55)
AST: 28 U/L (ref 5–34)
Albumin: 4.2 g/dL (ref 3.5–5.0)
Anion Gap: 9 mEq/L (ref 3–11)
BILIRUBIN TOTAL: 0.96 mg/dL (ref 0.20–1.20)
BUN: 9.7 mg/dL (ref 7.0–26.0)
CO2: 28 mEq/L (ref 22–29)
CREATININE: 1.2 mg/dL (ref 0.7–1.3)
Calcium: 9.7 mg/dL (ref 8.4–10.4)
Chloride: 104 mEq/L (ref 98–109)
GLUCOSE: 91 mg/dL (ref 70–140)
Potassium: 4.5 mEq/L (ref 3.5–5.1)
Sodium: 141 mEq/L (ref 136–145)
Total Protein: 6.8 g/dL (ref 6.4–8.3)

## 2014-06-06 LAB — CBC WITH DIFFERENTIAL/PLATELET
BASO%: 0.2 % (ref 0.0–2.0)
BASOS ABS: 0 10*3/uL (ref 0.0–0.1)
EOS ABS: 0.2 10*3/uL (ref 0.0–0.5)
EOS%: 3.6 % (ref 0.0–7.0)
HCT: 44.1 % (ref 38.4–49.9)
HEMOGLOBIN: 14.9 g/dL (ref 13.0–17.1)
LYMPH%: 31.9 % (ref 14.0–49.0)
MCH: 30.3 pg (ref 27.2–33.4)
MCHC: 33.8 g/dL (ref 32.0–36.0)
MCV: 89.8 fL (ref 79.3–98.0)
MONO#: 0.4 10*3/uL (ref 0.1–0.9)
MONO%: 7.4 % (ref 0.0–14.0)
NEUT%: 56.9 % (ref 39.0–75.0)
NEUTROS ABS: 3.2 10*3/uL (ref 1.5–6.5)
PLATELETS: 182 10*3/uL (ref 140–400)
RBC: 4.91 10*6/uL (ref 4.20–5.82)
RDW: 13.4 % (ref 11.0–14.6)
WBC: 5.5 10*3/uL (ref 4.0–10.3)
lymph#: 1.8 10*3/uL (ref 0.9–3.3)

## 2014-06-07 LAB — PSA

## 2014-06-13 ENCOUNTER — Ambulatory Visit (HOSPITAL_BASED_OUTPATIENT_CLINIC_OR_DEPARTMENT_OTHER): Payer: Medicare Other | Admitting: Oncology

## 2014-06-13 ENCOUNTER — Telehealth: Payer: Self-pay | Admitting: Oncology

## 2014-06-13 VITALS — BP 190/84 | HR 74 | Temp 98.7°F | Resp 18 | Wt 231.2 lb

## 2014-06-13 DIAGNOSIS — Z8546 Personal history of malignant neoplasm of prostate: Secondary | ICD-10-CM

## 2014-06-13 DIAGNOSIS — C61 Malignant neoplasm of prostate: Secondary | ICD-10-CM

## 2014-06-13 DIAGNOSIS — Z86718 Personal history of other venous thrombosis and embolism: Secondary | ICD-10-CM

## 2014-06-13 NOTE — Progress Notes (Signed)
Hematology and Oncology Follow Up Visit  [name] [id number] 1946-02-12 68 y.o. 06/13/2014 8:41 AM [name], MDPharr, [name],*   Principle Diagnosis: 68 year old gentleman diagnosed with prostate cancer in 2010. He had a Gleason score 3+3 = 6. He also had a DVT and was anticoagulated for total 6 months.   Prior Therapy: Status post prostatectomy done on 10/20/2008.  Current therapy: Observation and surveillance.   Interim History: [name] presents today for a follow-up visit. Since the last visit, he reports no new complaints. He continues to work full time without any hindrance for her decline. He has not reported any thrombosis or bleeding complications. He does not report any headaches or blurry vision double vision or syncope. Does not report any fevers chills or sweats. Does not report any chest pain, palpitation or orthopnea. Does not report any nausea, vomiting abdominal pain. Does not report any frequency urgency or hesitancy. He does not report any skeletal complaints. Rest of his review of systems unremarkable.  Medications: I have reviewed the patient's current medications.  Current Outpatient Prescriptions  Medication Sig Dispense Refill  . ALPRAZolam (XANAX) 0.5 MG tablet Take 0.5 mg by mouth 3 (three) times daily as needed.       Marland Kitchen co-enzyme Q-10 30 MG capsule Take 30 mg by mouth daily.        Marland Kitchen diltiazem 2 % GEL Apply 1 application topically 2 (two) times daily.  30 g  2  . Diltiazem HCl POWD APPLY TO AFFECTED AREA TWICE A DAY.  30 g  6  . fish oil-omega-3 fatty acids 1000 MG capsule Take 1 g by mouth daily.       . Flaxseed, Linseed, (FLAXSEED OIL PO) Take 1 capsule by mouth daily.      Marland Kitchen FLUoxetine (PROZAC) 20 MG tablet Take 40 mg by mouth daily.       . furosemide (LASIX) 40 MG tablet Take 40 mg by mouth. Three times a week      . Hypertonic Nasal Wash (SINUS RINSE BOTTLE KIT) PACK Place into the nose at bedtime.        Marland Kitchen KLOR-CON 10 10  MEQ tablet       . methylcellulose (ARTIFICIAL TEARS) 1 % ophthalmic solution Place 2 drops into both eyes daily.        . metoprolol succinate (TOPROL-XL) 100 MG 24 hr tablet 1 & 1/2 TABLETS BY MOUTH ONCE DAILY  45 tablet  11  . Multiple Vitamin (MULTIVITAMIN) tablet Take 1 tablet by mouth daily.        . naproxen sodium (ANAPROX) 220 MG tablet Take 440 mg by mouth as needed. Joint pain       . NON FORMULARY CPAP at hs       . nystatin cream (MYCOSTATIN) Apply 1 application topically 2 (two) times daily.  30 g  2  . oxyCODONE-acetaminophen (PERCOCET) 5-325 MG per tablet Take 1 tablet by mouth as needed. pain      . oxymetazoline (AFRIN) 0.05 % nasal spray Place 2 sprays into the nose 2 (two) times daily.        . pantoprazole (PROTONIX) 40 MG tablet Take 40 mg by mouth daily.      . potassium chloride (K-DUR,KLOR-CON) 10 MEQ tablet Take 10 mEq by mouth. Three times a week, takes along with the lasix      . pramoxine-hydrocortisone (PROCTOCREAM-HC) 1-1 % rectal cream APPLY TO AFFECTED AREA 3 TIMES DAILY  30 g  1  .  PROCTOSOL HC 2.5 % rectal cream Place 1 application rectally 2 (two) times daily.       . ramipril (ALTACE) 10 MG capsule Take 1 capsule (10 mg total) by mouth 2 (two) times daily.  60 capsule  11  . Rivaroxaban (XARELTO) 20 MG TABS Take 20 mg by mouth daily.      Marland Kitchen rOPINIRole (REQUIP) 1 MG tablet 1 mg at bedtime.       . rosuvastatin (CRESTOR) 5 MG tablet Take 1 tablet (5 mg total) by mouth daily.  30 tablet  11  . sildenafil (VIAGRA) 50 MG tablet Take 100 mg by mouth daily as needed.        No current facility-administered medications for this visit.     Allergies:  Allergies  Allergen Reactions  . Aspirin     REACTION: gi upset with higher dose  . Iohexol      Code: HIVES, Desc: [name] explained to me on 10/25/08 that someone misunderstood him when he spoke about his hives reaction to IV dye.[name] stated that he only had a reaction to MRI IV dye, not CT dye. Mr.  Ford also explained that he described his "feeling" he had, Onset Date: [id number]   Code: HIVES, Desc: (continued),,"feeling" he had was consistent with what I described as the "normal" feeling most people have during CT IV dye administration. [name] stated he was fine and that the "breath"issue was his anxiousness and not actual dyspnea., Onset Date: [id number]   . Penicillins Itching    Past Medical History, Surgical history, Social history, and Family History were reviewed and updated.  Physical Exam: There were no vitals taken for this visit. ECOG:  General appearance: alert and cooperative Head: Normocephalic, without obvious abnormality Neck: no adenopathy Lymph nodes: Cervical, supraclavicular, and axillary nodes normal. Heart:regular rate and rhythm, S1, S2 normal, no murmur, click, rub or gallop Lung:chest clear, no wheezing, rales, normal symmetric air entry. Abdomin: soft, non-tender, without masses or organomegaly EXT:no erythema, induration, or nodules   Lab Results: Lab Results  Component Value Date   WBC 5.5 06/06/2014   HGB 14.9 06/06/2014   HCT 44.1 06/06/2014   MCV 89.8 06/06/2014   PLT 182 06/06/2014     Chemistry      Component Value Date/Time   NA 141 06/06/2014 0834   NA 140 06/22/2011 1005   K 4.5 06/06/2014 0834   K 4.0 06/22/2011 1005   CL 103 11/02/2012 0737   CL 101 06/22/2011 1005   CO2 28 06/06/2014 0834   CO2 31 06/22/2011 1005   BUN 9.7 06/06/2014 0834   BUN 18 06/22/2011 1005   CREATININE 1.2 06/06/2014 0834   CREATININE 1.1 06/22/2011 1005      Component Value Date/Time   CALCIUM 9.7 06/06/2014 0834   CALCIUM 9.3 06/22/2011 1005   ALKPHOS 72 06/06/2014 0834   ALKPHOS 55 06/22/2011 1005   AST 28 06/06/2014 0834   AST 32 06/22/2011 1005   ALT 51 06/06/2014 0834   ALT 38 06/22/2011 1005   BILITOT 0.96 06/06/2014 0834   BILITOT 0.9 06/22/2011 1005     Results for Ford, [name] (MRN [id number]) as of 06/13/2014 08:40  Ref. Range  11/07/2011 09:54 05/02/2012 14:58 11/02/2012 07:37 06/06/2014 08:34  PSA Latest Range: <=4.00 ng/mL <0.01 <0.01 <0.01 <0.01    Impression and Plan:   68 year old gentleman with the following issues:  1. Prostate cancer diagnosed in February 2010. It is Gleason score 3+3 =  6. He is status post robotic prostatectomy done on 10/20/2008. His last PSA on 06/06/2014 continued to be undetectable. At this point no intervention is needed as I see no evidence to suggest recurrent disease.  2. History of DVT: He is no longer anticoagulated at this time.  3. Follow-up: Will be in 1 year.   Christiona Siddique, MD 10/30/20158:41 AM

## 2014-06-13 NOTE — Telephone Encounter (Signed)
gv and printed appt sched and avs for pt for OCT 2016 °

## 2014-08-12 ENCOUNTER — Other Ambulatory Visit: Payer: Self-pay | Admitting: Internal Medicine

## 2014-08-15 ENCOUNTER — Encounter (HOSPITAL_COMMUNITY): Payer: Self-pay | Admitting: Emergency Medicine

## 2014-08-15 ENCOUNTER — Emergency Department (HOSPITAL_COMMUNITY): Payer: Medicare Other

## 2014-08-15 ENCOUNTER — Emergency Department (HOSPITAL_COMMUNITY)
Admission: EM | Admit: 2014-08-15 | Discharge: 2014-08-15 | Disposition: A | Discharge status: Home / Self Care | Payer: Medicare Other | Attending: Emergency Medicine | Admitting: Emergency Medicine

## 2014-08-15 DIAGNOSIS — S8991XA Unspecified injury of right lower leg, initial encounter: Secondary | ICD-10-CM | POA: Diagnosis not present

## 2014-08-15 DIAGNOSIS — Z7901 Long term (current) use of anticoagulants: Secondary | ICD-10-CM | POA: Diagnosis not present

## 2014-08-15 DIAGNOSIS — S0990XA Unspecified injury of head, initial encounter: Secondary | ICD-10-CM | POA: Diagnosis not present

## 2014-08-15 DIAGNOSIS — Z862 Personal history of diseases of the blood and blood-forming organs and certain disorders involving the immune mechanism: Secondary | ICD-10-CM | POA: Diagnosis not present

## 2014-08-15 DIAGNOSIS — K219 Gastro-esophageal reflux disease without esophagitis: Secondary | ICD-10-CM | POA: Insufficient documentation

## 2014-08-15 DIAGNOSIS — Z79899 Other long term (current) drug therapy: Secondary | ICD-10-CM | POA: Diagnosis not present

## 2014-08-15 DIAGNOSIS — F419 Anxiety disorder, unspecified: Secondary | ICD-10-CM | POA: Insufficient documentation

## 2014-08-15 DIAGNOSIS — Z87438 Personal history of other diseases of male genital organs: Secondary | ICD-10-CM | POA: Insufficient documentation

## 2014-08-15 DIAGNOSIS — Z8739 Personal history of other diseases of the musculoskeletal system and connective tissue: Secondary | ICD-10-CM | POA: Insufficient documentation

## 2014-08-15 DIAGNOSIS — G2581 Restless legs syndrome: Secondary | ICD-10-CM | POA: Diagnosis not present

## 2014-08-15 DIAGNOSIS — E669 Obesity, unspecified: Secondary | ICD-10-CM | POA: Diagnosis not present

## 2014-08-15 DIAGNOSIS — Z880 Allergy status to penicillin: Secondary | ICD-10-CM | POA: Insufficient documentation

## 2014-08-15 DIAGNOSIS — G4730 Sleep apnea, unspecified: Secondary | ICD-10-CM | POA: Diagnosis not present

## 2014-08-15 DIAGNOSIS — Z86718 Personal history of other venous thrombosis and embolism: Secondary | ICD-10-CM | POA: Insufficient documentation

## 2014-08-15 DIAGNOSIS — R220 Localized swelling, mass and lump, head: Secondary | ICD-10-CM | POA: Diagnosis not present

## 2014-08-15 DIAGNOSIS — Z9981 Dependence on supplemental oxygen: Secondary | ICD-10-CM | POA: Insufficient documentation

## 2014-08-15 DIAGNOSIS — E785 Hyperlipidemia, unspecified: Secondary | ICD-10-CM | POA: Diagnosis not present

## 2014-08-15 DIAGNOSIS — S62617A Displaced fracture of proximal phalanx of left little finger, initial encounter for closed fracture: Secondary | ICD-10-CM | POA: Insufficient documentation

## 2014-08-15 DIAGNOSIS — M7989 Other specified soft tissue disorders: Secondary | ICD-10-CM | POA: Diagnosis not present

## 2014-08-15 DIAGNOSIS — Y92331 Roller skating rink as the place of occurrence of the external cause: Secondary | ICD-10-CM | POA: Diagnosis not present

## 2014-08-15 DIAGNOSIS — Y9351 Activity, roller skating (inline) and skateboarding: Secondary | ICD-10-CM | POA: Insufficient documentation

## 2014-08-15 DIAGNOSIS — S0592XA Unspecified injury of left eye and orbit, initial encounter: Secondary | ICD-10-CM | POA: Diagnosis not present

## 2014-08-15 DIAGNOSIS — W19XXXA Unspecified fall, initial encounter: Secondary | ICD-10-CM

## 2014-08-15 DIAGNOSIS — Z86711 Personal history of pulmonary embolism: Secondary | ICD-10-CM | POA: Insufficient documentation

## 2014-08-15 DIAGNOSIS — S098XXA Other specified injuries of head, initial encounter: Secondary | ICD-10-CM | POA: Diagnosis not present

## 2014-08-15 DIAGNOSIS — I10 Essential (primary) hypertension: Secondary | ICD-10-CM | POA: Diagnosis not present

## 2014-08-15 DIAGNOSIS — Z720 Tobacco use: Secondary | ICD-10-CM | POA: Insufficient documentation

## 2014-08-15 DIAGNOSIS — C61 Malignant neoplasm of prostate: Secondary | ICD-10-CM | POA: Diagnosis not present

## 2014-08-15 DIAGNOSIS — Z8546 Personal history of malignant neoplasm of prostate: Secondary | ICD-10-CM | POA: Diagnosis not present

## 2014-08-15 DIAGNOSIS — Y998 Other external cause status: Secondary | ICD-10-CM | POA: Diagnosis not present

## 2014-08-15 DIAGNOSIS — C679 Malignant neoplasm of bladder, unspecified: Secondary | ICD-10-CM | POA: Diagnosis not present

## 2014-08-15 DIAGNOSIS — F329 Major depressive disorder, single episode, unspecified: Secondary | ICD-10-CM | POA: Insufficient documentation

## 2014-08-15 DIAGNOSIS — S62609A Fracture of unspecified phalanx of unspecified finger, initial encounter for closed fracture: Secondary | ICD-10-CM

## 2014-08-15 DIAGNOSIS — S6992XA Unspecified injury of left wrist, hand and finger(s), initial encounter: Secondary | ICD-10-CM | POA: Diagnosis present

## 2014-08-15 LAB — PROTIME-INR
INR: 1.06 (ref 0.00–1.49)
Prothrombin Time: 13.9 seconds (ref 11.6–15.2)

## 2014-08-15 MED ORDER — HYDROCODONE-ACETAMINOPHEN 5-325 MG PO TABS: 1.0000 | ORAL_TABLET | Freq: Four times a day (QID) | ORAL | Status: DC | PRN

## 2014-08-15 MED ORDER — HYDROCODONE-ACETAMINOPHEN 5-325 MG PO TABS
1.0000 | ORAL_TABLET | Freq: Once | ORAL | Status: AC
  Administered 2014-08-15: 2 via ORAL
  Filled 2014-08-15: qty 2

## 2014-08-15 NOTE — Progress Notes (Signed)
Orthopedic Tech Progress Note Patient Details:  [name] 09/07/1945 [id number]  Ortho Devices Type of Ortho Device: Finger splint Ortho Device/Splint Location: LUE Ortho Device/Splint Interventions: Ordered, Application   Braulio Bosch 08/15/2014, 6:39 PM

## 2014-08-15 NOTE — ED Notes (Signed)
Spoke to Vascular Tech regarding patient.  Tech is at [facility] at this time but will see patient when she returns to [facility].

## 2014-08-15 NOTE — Discharge Instructions (Signed)
Please follow up with your primary care physician in 1-2 days. If you do not have one please call the [facility] number listed above. Please take pain medication and/or muscle relaxants as prescribed and as needed for pain. Please do not drive on narcotic pain medication or on muscle relaxants. Please read all discharge instructions and return precautions.   Finger Fracture Fractures of fingers are breaks in the bones of the fingers. There are many types of fractures. There are different ways of treating these fractures. Your health care provider will discuss the best way to treat your fracture. CAUSES Traumatic injury is the main cause of broken fingers. These include:  Injuries while playing sports.  Workplace injuries.  Falls. RISK FACTORS Activities that can increase your risk of finger fractures include:  Sports.  Workplace activities that involve machinery.  A condition called osteoporosis, which can make your bones less dense and cause them to fracture more easily. SIGNS AND SYMPTOMS The main symptoms of a broken finger are pain and swelling within 15 minutes after the injury. Other symptoms include:  Bruising of your finger.  Stiffness of your finger.  Numbness of your finger.  Exposed bones (compound fracture) if the fracture is severe. DIAGNOSIS  The best way to diagnose a broken bone is with X-ray imaging. Additionally, your health care provider will use this X-ray image to evaluate the position of the broken finger bones.  TREATMENT  Finger fractures can be treated with:   Nonreduction--This means the bones are in place. The finger is splinted without changing the positions of the bone pieces. The splint is usually left on for about a week to 10 days. This will depend on your fracture and what your health care provider thinks.  Closed reduction--The bones are put back into position without using surgery. The finger is then splinted.  Open  reduction and internal fixation--The fracture site is opened. Then the bone pieces are fixed into place with pins or some type of hardware. This is seldom required. It depends on the severity of the fracture. HOME CARE INSTRUCTIONS   Follow your health care provider's instructions regarding activities, exercises, and physical therapy.  Only take over-the-counter or prescription medicines for pain, discomfort, or fever as directed by your health care provider. SEEK MEDICAL CARE IF: You have pain or swelling that limits the motion or use of your fingers. SEEK IMMEDIATE MEDICAL CARE IF:  Your finger becomes numb. MAKE SURE YOU:   Understand these instructions.  Will watch your condition.  Will get help right away if you are not doing well or get worse. Document Released: 11/13/2000 Document Revised: 05/22/2013 Document Reviewed: 03/13/2013 [facility] Patient Information 2015 [place], [place]. This information is not intended to replace advice given to you by your health care provider. Make sure you discuss any questions you have with your health care provider.  Head Injury You have received a head injury. It does not appear serious at this time. Headaches and vomiting are common following head injury. It should be easy to awaken from sleeping. Sometimes it is necessary for you to stay in the emergency department for a while for observation. Sometimes admission to the hospital may be needed. After injuries such as yours, most problems occur within the first 24 hours, but side effects may occur up to 7-10 days after the injury. It is important for you to carefully monitor your condition and contact your health care provider or seek immediate medical care if there is  a change in your condition. WHAT ARE THE TYPES OF HEAD INJURIES? Head injuries can be as minor as a bump. Some head injuries can be more severe. More severe head injuries include:  A jarring injury to the brain (concussion).  A bruise  of the brain (contusion). This mean there is bleeding in the brain that can cause swelling.  A cracked skull (skull fracture).  Bleeding in the brain that collects, clots, and forms a bump (hematoma). WHAT CAUSES A HEAD INJURY? A serious head injury is most likely to happen to someone who is in a car wreck and is not wearing a seat belt. Other causes of major head injuries include bicycle or motorcycle accidents, sports injuries, and falls. HOW ARE HEAD INJURIES DIAGNOSED? A complete history of the event leading to the injury and your current symptoms will be helpful in diagnosing head injuries. Many times, pictures of the brain, such as CT or MRI are needed to see the extent of the injury. Often, an overnight hospital stay is necessary for observation.  WHEN SHOULD I SEEK IMMEDIATE MEDICAL CARE?  You should get help right away if:  You have confusion or drowsiness.  You feel sick to your stomach (nauseous) or have continued, forceful vomiting.  You have dizziness or unsteadiness that is getting worse.  You have severe, continued headaches not relieved by medicine. Only take over-the-counter or prescription medicines for pain, fever, or discomfort as directed by your health care provider.  You do not have normal function of the arms or legs or are unable to walk.  You notice changes in the black spots in the center of the colored part of your eye (pupil).  You have a clear or bloody fluid coming from your nose or ears.  You have a loss of vision. During the next 24 hours after the injury, you must stay with someone who can watch you for the warning signs. This person should contact local emergency services (911 in the [place] if you have seizures, you become unconscious, or you are unable to wake up. HOW CAN I PREVENT A HEAD INJURY IN THE FUTURE? The most important factor for preventing major head injuries is avoiding motor vehicle accidents. To minimize the potential for damage to your  head, it is crucial to wear seat belts while riding in motor vehicles. Wearing helmets while bike riding and playing collision sports (like football) is also helpful. Also, avoiding dangerous activities around the house will further help reduce your risk of head injury.  WHEN CAN I RETURN TO NORMAL ACTIVITIES AND ATHLETICS? You should be reevaluated by your health care provider before returning to these activities. If you have any of the following symptoms, you should not return to activities or contact sports until 1 week after the symptoms have stopped:  Persistent headache.  Dizziness or vertigo.  Poor attention and concentration.  Confusion.  Memory problems.  Nausea or vomiting.  Fatigue or tire easily.  Irritability.  Intolerant of bright lights or loud noises.  Anxiety or depression.  Disturbed sleep. MAKE SURE YOU:   Understand these instructions.  Will watch your condition.  Will get help right away if you are not doing well or get worse. Document Released: 08/01/2005 Document Revised: 08/06/2013 Document Reviewed: 04/08/2013 [facility] Patient Information 2015 [place], [place]. This information is not intended to replace advice given to you by your health care provider. Make sure you discuss any questions you have with your health care provider.

## 2014-08-15 NOTE — ED Provider Notes (Signed)
CSN: [id number]     Arrival date & time 08/15/14  1538 History   First MD Initiated Contact with Patient 08/15/14 1625     Chief Complaint  Patient presents with  . Fall  . Head Injury     (Consider location/radiation/quality/duration/timing/severity/associated sxs/prior Treatment) HPI Comments: Patient is a 69 rolled male past medical history significant for multiple medical co-morbidities presenting to the ED after falling while roller skating today. He states he was accidentally tripped and fell, first landing with his left hand and then hit his head on the group. He states he had no LOC. He is complaining of throbbing pain to his left orbit and left hand. He is on Xarelto. Patient also states he has had new right leg swelling and pain since yesterday, history of 3 DVTs in the past, no IVC filter in place.   Patient is a 69 y.o. male presenting with fall and head injury.  Fall Associated symptoms include arthralgias and headaches. Pertinent negatives include no abdominal pain, chest pain, nausea or vomiting.  Head Injury Associated symptoms: headache   Associated symptoms: no nausea and no vomiting     Past Medical History  Diagnosis Date  . HTN (hypertension)   . GERD (gastroesophageal reflux disease)   . Prostate cancer   . DVT (deep venous thrombosis)     secondary to prostate surgery  . Chest pain   . Aortic dilatation   . Pulmonary embolism 2011  . History of echocardiogram 10/30/2009    EF 55-60%  . Tricuspid regurgitation 02/08/2008    trace  . Mitral regurgitation 10/30/2009    trace  . Apnea, sleep     uses cpap at hs  . Hyperlipidemia   . Hiatal hernia   . Esophageal stricture   . Depression   . Cataract   . PVCs (premature ventricular contractions)   . Pulmonary embolus 11/11/2011    10/25/08 5 days post prostatectomy for prostate ca  . DVT, lower extremity, distal 11/11/2011    RLE DVT & PE 5 days post prostatectomy 10/25/08  . Antiphospholipid antibody  syndrome 05/22/2012    Found subsequent to recurrent PE 9/13  . Fatty liver   . Mild aortic insufficiency   . Chronic anxiety   . Lupus anticoagulant positive   . ED (erectile dysfunction)   . RLS (restless legs syndrome)   . Obesity   . History of TMJ syndrome   . Chronic anal fissure    Past Surgical History  Procedure Laterality Date  . Prostate surgery    . Tonsillectomy    . Appendectomy    . Eye surgery Bilateral   . Bladder surgery    . Cholecystectomy    . Esophagogastroduodenoscopy    . Colonoscopy     Family History  Problem Relation Age of Onset  . Hypertension Mother   . Hypertension Father   . Stroke Father   . Diabetes Maternal Grandmother   . Colon polyps Brother   . Colon polyps Father   . Crohn's disease Brother   . Heart disease Maternal Grandmother   . Cirrhosis      [employer]  . Colon cancer Father     questionable   History  Substance Use Topics  . Smoking status: Current Some Day Smoker    Types: Cigars  . Smokeless tobacco: Never Used     Comment: 5 cigars /week--form given 10-24-12  . Alcohol Use: 8.4 oz/week    14 Glasses of wine  per week     Comment: 2 glasses wine/day    Review of Systems  Eyes: Negative for visual disturbance.  Respiratory: Negative for shortness of breath.   Cardiovascular: Positive for leg swelling. Negative for chest pain and palpitations.  Gastrointestinal: Negative for nausea, vomiting and abdominal pain.  Musculoskeletal: Positive for arthralgias.  Neurological: Positive for headaches.  All other systems reviewed and are negative.     Allergies  Aspirin; Iohexol; and Penicillins  Home Medications   Prior to Admission medications   Medication Sig Start Date End Date Taking? Authorizing Provider  ALPRAZolam Duanne Moron) 0.5 MG tablet Take 0.5 mg by mouth 3 (three) times daily as needed.  03/26/14   Historical Provider, MD  co-enzyme Q-10 30 MG capsule Take 30 mg by mouth daily.      Historical Provider, MD   diltiazem 2 % GEL Apply 1 application topically 2 (two) times daily. 03/31/14   Gatha Mayer, MD  Diltiazem HCl POWD APPLY TO AFFECTED AREA TWICE A DAY. 05/27/14   Gatha Mayer, MD  fish oil-omega-3 fatty acids 1000 MG capsule Take 1 g by mouth daily.     Historical Provider, MD  Flaxseed, Linseed, (FLAXSEED OIL PO) Take 1 capsule by mouth daily.    Historical Provider, MD  FLUoxetine (PROZAC) 20 MG tablet Take 40 mg by mouth daily.  09/29/13   Historical Provider, MD  furosemide (LASIX) 40 MG tablet Take 40 mg by mouth. Three times a week    Historical Provider, MD  HYDROcodone-acetaminophen (NORCO/VICODIN) 5-325 MG per tablet Take 1-2 tablets by mouth every 6 (six) hours as needed. 08/15/14   Korah Hufstedler L Kelly Ranieri, PA-C  Hypertonic Nasal Wash (SINUS RINSE BOTTLE KIT) PACK Place into the nose at bedtime.      Historical Provider, MD  KLOR-CON 10 10 MEQ tablet  04/09/14   Historical Provider, MD  methylcellulose (ARTIFICIAL TEARS) 1 % ophthalmic solution Place 2 drops into both eyes daily.      Historical Provider, MD  metoprolol succinate (TOPROL-XL) 100 MG 24 hr tablet 1 & 1/2 TABLETS BY MOUTH ONCE DAILY 12/17/13   [name], MD  Multiple Vitamin (MULTIVITAMIN) tablet Take 1 tablet by mouth daily.      Historical Provider, MD  naproxen sodium (ANAPROX) 220 MG tablet Take 440 mg by mouth as needed. Joint pain     Historical Provider, MD  NON FORMULARY CPAP at hs     Historical Provider, MD  nystatin cream (MYCOSTATIN) APPLY 1 APPLICATION TOPICALLY 2 (TWO) TIMES DAILY. 08/12/14   Gatha Mayer, MD  oxyCODONE-acetaminophen (PERCOCET) 5-325 MG per tablet Take 1 tablet by mouth as needed. pain 12/09/10   Historical Provider, MD  oxymetazoline (AFRIN) 0.05 % nasal spray Place 2 sprays into the nose 2 (two) times daily.      Historical Provider, MD  pantoprazole (PROTONIX) 40 MG tablet Take 40 mg by mouth daily.    Historical Provider, MD  potassium chloride (K-DUR,KLOR-CON) 10 MEQ tablet Take 10  mEq by mouth. Three times a week, takes along with the lasix    Historical Provider, MD  pramoxine-hydrocortisone (PROCTOCREAM-HC) 1-1 % rectal cream APPLY TO AFFECTED AREA 3 TIMES DAILY 09/24/12   Sable Feil, MD  PROCTOSOL HC 2.5 % rectal cream Place 1 application rectally 2 (two) times daily.  05/05/14   Historical Provider, MD  ramipril (ALTACE) 10 MG capsule Take 1 capsule (10 mg total) by mouth 2 (two) times daily. 12/17/13   Roderic Palau  Adora Fridge, MD  Rivaroxaban (XARELTO) 20 MG TABS Take 20 mg by mouth daily.    Historical Provider, MD  rOPINIRole (REQUIP) 1 MG tablet 1 mg at bedtime.  08/27/12   Historical Provider, MD  rosuvastatin (CRESTOR) 5 MG tablet Take 1 tablet (5 mg total) by mouth daily. 12/17/13   [name], MD  sildenafil (VIAGRA) 50 MG tablet Take 100 mg by mouth daily as needed.     Historical Provider, MD   BP 138/83 mmHg  Pulse 68  Temp(Src) 98 F (36.7 C) (Oral)  Resp 16  SpO2 96% Physical Exam  Constitutional: He is oriented to person, place, and time. He appears well-developed and well-nourished. No distress.  HENT:  Head: Normocephalic and atraumatic. Head is without Battle's sign and without contusion.    Right Ear: External ear normal.  Left Ear: External ear normal.  Nose: Nose normal.  Mouth/Throat: Oropharynx is clear and moist. No oropharyngeal exudate.  Eyes: Conjunctivae and EOM are normal. Pupils are equal, round, and reactive to light.  Neck: Normal range of motion. Neck supple.  Cardiovascular: Normal rate, regular rhythm, normal heart sounds and intact distal pulses.   Pulmonary/Chest: Effort normal and breath sounds normal. No respiratory distress.  Abdominal: Soft. There is no tenderness.  Musculoskeletal:       Right wrist: Normal.       Left wrist: Normal.       Right ankle: He exhibits decreased range of motion. Tenderness.       Left ankle: Normal.       Cervical back: Normal.       Right hand: Normal.       Left hand: He exhibits  tenderness. He exhibits normal range of motion, no bony tenderness, normal two-point discrimination, normal capillary refill, no deformity, no laceration and no swelling. Normal sensation noted. Normal strength noted.       Hands:      Right lower leg: He exhibits tenderness and swelling.       Left lower leg: Normal.       Right foot: Normal.       Left foot: Normal.  Neurological: He is alert and oriented to person, place, and time. He has normal strength. No cranial nerve deficit. Gait normal. GCS eye subscore is 4. GCS verbal subscore is 5. GCS motor subscore is 6.  Sensation grossly intact.  No pronator drift.  Bilateral heel-knee-shin intact.  Skin: Skin is warm and dry. He is not diaphoretic.  Nursing note and vitals reviewed.   ED Course  Procedures (including critical care time) Medications  HYDROcodone-acetaminophen (NORCO/VICODIN) 5-325 MG per tablet 1-2 tablet (2 tablets Oral Given 08/15/14 1817)    Labs Review Labs Reviewed  PROTIME-INR    Imaging Review Ct Head Wo Contrast  08/15/2014   CLINICAL DATA:  Trauma. Left-sided by injury. On blood thinners. Prostate and bladder cancer.  EXAM: CT HEAD WITHOUT CONTRAST  TECHNIQUE: Contiguous axial images were obtained from the base of the skull through the vertex without intravenous contrast.  COMPARISON:  06/09/2013  FINDINGS: Sinuses/Soft tissues: Mild left supraorbital soft tissue swelling. Left flexor sinus mucous retention cyst or polyp. Clear mastoid air cells.  Intracranial: Mild to moderate low density in the periventricular white matter likely related to small vessel disease. No mass lesion, hemorrhage, hydrocephalus, acute infarct, intra-axial, or extra-axial fluid collection.  IMPRESSION: 1. Mild left supraorbital soft tissue swelling, without acute intracranial abnormality. 2. Mild to moderate small vessel ischemic change.  Electronically Signed   By: Abigail Miyamoto M.D.   On: 08/15/2014 16:59   Dg Finger Little  Left  08/15/2014   CLINICAL DATA:  Initial encounter for left finger injury after fall while ice skating  EXAM: LEFT LITTLE FINGER 2+V  COMPARISON:  None.  FINDINGS: Three view exam of the left little finger to nose and transverse fracture, potentially comminuted, through the proximal aspect of the little finger proximal phalanx. Lateral view is limited by superimposition of bony anatomy, but there appears to be apex anterior angulation. No definite extension of fracture line to the proximal articular surface is evident.  IMPRESSION: Transverse fracture involving the proximal aspect of the little finger proximal phalanx.   Electronically Signed   By: Misty Stanley M.D.   On: 08/15/2014 16:28     EKG Interpretation None        Expand All Collapse All   Right lower extremity venous duplex completed. Right: No evidence of DVT, superficial thrombosis, or Baker's cyst. Left: Negative for DVT in the common femoral vein.       MDM   Final diagnoses:  Minor head injury without loss of consciousness, initial encounter  Finger fracture, left, closed, initial encounter    Filed Vitals:   08/15/14 1830  BP: 138/83  Pulse: 68  Temp:   Resp:    Afebrile, NAD, non-toxic appearing, AAOx4.  No neurofocal deficits. Neurovascularly intact. Normal sensation. No evidence of compartment syndrome. I have reviewed nursing notes, vital signs, and all appropriate lab and imaging results for this patient. CT scan reviewed. No evidence of intracranial bleeding, soft tissue swelling surrounding left orbit without fracture noted. Patient with transverse fracture of left fifth digit, closed will place in finger splint. Right lower extremity swelling evaluated with venous study, no evidence of DVT. Return precautions discussed. Advised PCP follow-up. Patient is agreeable to plan. Patient is stable at time of discharge. Patient d/w with Dr. Wilson Singer, agrees with plan.    Harlow Mares, PA-C 08/15/14  2358  Virgel Manifold, MD 08/20/14 [phone]

## 2014-08-15 NOTE — Progress Notes (Signed)
Right lower extremity venous duplex completed.  Right:  No evidence of DVT, superficial thrombosis, or Baker's cyst.  Left:  Negative for DVT in the common femoral vein.  

## 2014-08-15 NOTE — ED Notes (Signed)
Pt sts fell today while skating c/o left pinky finger pain and head pain where struck head; pt takes xerelto

## 2014-08-19 DIAGNOSIS — F411 Generalized anxiety disorder: Secondary | ICD-10-CM | POA: Diagnosis not present

## 2014-08-19 DIAGNOSIS — Z23 Encounter for immunization: Secondary | ICD-10-CM | POA: Diagnosis not present

## 2014-08-19 DIAGNOSIS — Z0000 Encounter for general adult medical examination without abnormal findings: Secondary | ICD-10-CM | POA: Diagnosis not present

## 2014-09-04 DIAGNOSIS — G4733 Obstructive sleep apnea (adult) (pediatric): Secondary | ICD-10-CM | POA: Diagnosis not present

## 2014-10-06 DIAGNOSIS — E6609 Other obesity due to excess calories: Secondary | ICD-10-CM | POA: Diagnosis not present

## 2014-10-06 DIAGNOSIS — R748 Abnormal levels of other serum enzymes: Secondary | ICD-10-CM | POA: Diagnosis not present

## 2014-10-09 ENCOUNTER — Encounter: Payer: Self-pay | Admitting: Internal Medicine

## 2014-10-09 ENCOUNTER — Ambulatory Visit (INDEPENDENT_AMBULATORY_CARE_PROVIDER_SITE_OTHER): Payer: Medicare Other | Admitting: Internal Medicine

## 2014-10-09 VITALS — BP 140/76 | HR 68 | Ht 70.5 in | Wt 224.2 lb

## 2014-10-09 DIAGNOSIS — R945 Abnormal results of liver function studies: Secondary | ICD-10-CM

## 2014-10-09 DIAGNOSIS — K601 Chronic anal fissure: Secondary | ICD-10-CM | POA: Diagnosis not present

## 2014-10-09 DIAGNOSIS — K648 Other hemorrhoids: Secondary | ICD-10-CM | POA: Diagnosis not present

## 2014-10-09 NOTE — Progress Notes (Signed)
Subjective:    Patient ID: [name], male    DOB: 1945/10/09, 69 y.o.   MRN: [id number] Cc: f/u anal fissure, hemorrhoids HPI He is 70% better with anal pain. Using diltiazem "It really helps" No bleeding Stools ok Feels a small posterior anal nodule Medications, allergies, past medical history, past surgical history, family history and social history are reviewed and updated in the EMR.  Review of Systems Jock itch resolved  Mild transaminase elevation Was told to lose weight    Objective:   Physical Exam  BP 140/76 mmHg  Pulse 68  Ht 5' 10.5" (1.791 m)  Wt 224 lb 4 oz (101.719 kg)  BMI 31.71 kg/m2 NAD Rectal - NL anoderm, small raised area posterior anal canal at site of fissure, not tender  DATA: ALT 60 top NL 44 and other LFT's NL 08/13/2014    Assessment & Plan:  Chronic anal fissure - posterior Improved Continue diltiazem gel refill prn x 1 year   Hemorrhoids, internal, with bleeding Improved   LIVER FUNCTION TESTS, ABNORMAL Chronic, mild recurrent - fatty liver vs meds or both [name] is following     Current outpatient prescriptions:  .  ALPRAZolam (XANAX) 0.5 MG tablet, Take 0.5 mg by mouth 3 (three) times daily as needed. , Disp: , Rfl:  .  co-enzyme Q-10 30 MG capsule, Take 30 mg by mouth daily.  , Disp: , Rfl:  .  diltiazem 2 % GEL, Apply 1 application topically 2 (two) times daily., Disp: 30 g, Rfl: 2 .  Diltiazem HCl POWD, APPLY TO AFFECTED AREA TWICE A DAY., Disp: 30 g, Rfl: 6 .  fish oil-omega-3 fatty acids 1000 MG capsule, Take 1 g by mouth daily. , Disp: , Rfl:  .  Flaxseed, Linseed, (FLAXSEED OIL PO), Take 1 capsule by mouth daily., Disp: , Rfl:  .  FLUoxetine (PROZAC) 20 MG tablet, Take 40 mg by mouth daily. , Disp: , Rfl:  .  furosemide (LASIX) 40 MG tablet, Take 40 mg by mouth. Three times a week, Disp: , Rfl:  .  HYDROcodone-acetaminophen (NORCO/VICODIN) 5-325 MG per tablet, Take 1-2 tablets by mouth every 6 (six) hours as  needed., Disp: 10 tablet, Rfl: 0 .  Hypertonic Nasal Wash (SINUS RINSE BOTTLE KIT) PACK, Place into the nose at bedtime.  , Disp: , Rfl:  .  KLOR-CON 10 10 MEQ tablet, , Disp: , Rfl:  .  methylcellulose (ARTIFICIAL TEARS) 1 % ophthalmic solution, Place 2 drops into both eyes daily.  , Disp: , Rfl:  .  metoprolol succinate (TOPROL-XL) 100 MG 24 hr tablet, 1 & 1/2 TABLETS BY MOUTH ONCE DAILY (Patient taking differently: 2 TABLETS BY MOUTH ONCE DAILY), Disp: 45 tablet, Rfl: 11 .  Multiple Vitamin (MULTIVITAMIN) tablet, Take 1 tablet by mouth daily.  , Disp: , Rfl:  .  naproxen sodium (ANAPROX) 220 MG tablet, Take 440 mg by mouth as needed. Joint pain , Disp: , Rfl:  .  NON FORMULARY, CPAP at hs , Disp: , Rfl:  .  nystatin cream (MYCOSTATIN), APPLY 1 APPLICATION TOPICALLY 2 (TWO) TIMES DAILY., Disp: 30 g, Rfl: 2 .  oxyCODONE-acetaminophen (PERCOCET) 5-325 MG per tablet, Take 1 tablet by mouth as needed. pain, Disp: , Rfl:  .  oxymetazoline (AFRIN) 0.05 % nasal spray, Place 2 sprays into the nose 2 (two) times daily.  , Disp: , Rfl:  .  pantoprazole (PROTONIX) 40 MG tablet, Take 40 mg by mouth daily., Disp: , Rfl:  .  potassium chloride (K-DUR,KLOR-CON) 10 MEQ tablet, Take 10 mEq by mouth. Three times a week, takes along with the lasix, Disp: , Rfl:  .  pramoxine-hydrocortisone (PROCTOCREAM-HC) 1-1 % rectal cream, APPLY TO AFFECTED AREA 3 TIMES DAILY, Disp: 30 g, Rfl: 1 .  PROCTOSOL HC 2.5 % rectal cream, Place 1 application rectally 2 (two) times daily. , Disp: , Rfl:  .  ramipril (ALTACE) 10 MG capsule, Take 1 capsule (10 mg total) by mouth 2 (two) times daily., Disp: 60 capsule, Rfl: 11 .  Rivaroxaban (XARELTO) 20 MG TABS, Take 20 mg by mouth daily., Disp: , Rfl:  .  rOPINIRole (REQUIP) 1 MG tablet, 1 mg at bedtime. , Disp: , Rfl:  .  rosuvastatin (CRESTOR) 5 MG tablet, Take 1 tablet (5 mg total) by mouth daily., Disp: 30 tablet, Rfl: 11 .  sertraline (ZOLOFT) 50 MG tablet, Take 1 tablet by mouth  daily., Disp: , Rfl:  .  sildenafil (VIAGRA) 50 MG tablet, Take 100 mg by mouth daily as needed. , Disp: , Rfl:   I appreciate the opportunity to care for this patient.  IH:[id number],ZDGLOV DAVIDSON, MD

## 2014-10-09 NOTE — Patient Instructions (Signed)
Follow up with Dr Carlean Purl as needed.   Just call your pharmacy when you need a refill on your Diltiazem gel so we can send that in for you.     I appreciate the opportunity to care for you.

## 2014-10-09 NOTE — Assessment & Plan Note (Signed)
Chronic, mild recurrent - fatty liver vs meds or both [name] is following

## 2014-10-09 NOTE — Assessment & Plan Note (Signed)
Improved Continue diltiazem gel refill prn x 1 year

## 2014-10-09 NOTE — Assessment & Plan Note (Signed)
Improved

## 2014-10-24 DIAGNOSIS — Z8551 Personal history of malignant neoplasm of bladder: Secondary | ICD-10-CM | POA: Diagnosis not present

## 2014-10-24 DIAGNOSIS — Z8546 Personal history of malignant neoplasm of prostate: Secondary | ICD-10-CM | POA: Diagnosis not present

## 2014-10-30 ENCOUNTER — Telehealth: Payer: Self-pay | Admitting: Cardiovascular Disease

## 2014-10-30 NOTE — Telephone Encounter (Signed)
Close encounter 

## 2014-11-11 DIAGNOSIS — C679 Malignant neoplasm of bladder, unspecified: Secondary | ICD-10-CM | POA: Diagnosis not present

## 2014-11-11 DIAGNOSIS — C61 Malignant neoplasm of prostate: Secondary | ICD-10-CM | POA: Diagnosis not present

## 2014-11-18 DIAGNOSIS — R748 Abnormal levels of other serum enzymes: Secondary | ICD-10-CM | POA: Diagnosis not present

## 2014-11-19 DIAGNOSIS — G4733 Obstructive sleep apnea (adult) (pediatric): Secondary | ICD-10-CM | POA: Diagnosis not present

## 2014-11-19 DIAGNOSIS — Z8546 Personal history of malignant neoplasm of prostate: Secondary | ICD-10-CM | POA: Diagnosis not present

## 2014-11-19 DIAGNOSIS — C679 Malignant neoplasm of bladder, unspecified: Secondary | ICD-10-CM | POA: Diagnosis not present

## 2014-11-19 DIAGNOSIS — Z8551 Personal history of malignant neoplasm of bladder: Secondary | ICD-10-CM | POA: Diagnosis not present

## 2014-12-08 ENCOUNTER — Encounter: Payer: Self-pay | Admitting: *Deleted

## 2014-12-10 ENCOUNTER — Other Ambulatory Visit: Payer: Self-pay | Admitting: Cardiovascular Disease

## 2014-12-10 NOTE — Telephone Encounter (Signed)
Rx(s) sent to pharmacy electronically. OV 5/17

## 2014-12-11 ENCOUNTER — Other Ambulatory Visit: Payer: Self-pay | Admitting: Cardiovascular Disease

## 2014-12-11 NOTE — Telephone Encounter (Signed)
Rx(s) sent to pharmacy electronically.  

## 2014-12-20 ENCOUNTER — Other Ambulatory Visit: Payer: Self-pay | Admitting: Cardiovascular Disease

## 2014-12-22 NOTE — Telephone Encounter (Signed)
Rx(s) sent to pharmacy electronically.  

## 2014-12-30 ENCOUNTER — Encounter: Payer: Self-pay | Admitting: Cardiovascular Disease

## 2014-12-30 ENCOUNTER — Ambulatory Visit (INDEPENDENT_AMBULATORY_CARE_PROVIDER_SITE_OTHER): Payer: Medicare Other | Admitting: Cardiovascular Disease

## 2014-12-30 VITALS — BP 132/72 | HR 64 | Ht 71.0 in | Wt 219.1 lb

## 2014-12-30 DIAGNOSIS — I493 Ventricular premature depolarization: Secondary | ICD-10-CM | POA: Diagnosis not present

## 2014-12-30 DIAGNOSIS — I2699 Other pulmonary embolism without acute cor pulmonale: Secondary | ICD-10-CM | POA: Diagnosis not present

## 2014-12-30 DIAGNOSIS — I10 Essential (primary) hypertension: Secondary | ICD-10-CM | POA: Diagnosis not present

## 2014-12-30 MED ORDER — METOPROLOL TARTRATE 100 MG PO TABS: 100.0000 mg | ORAL_TABLET | Freq: Two times a day (BID) | ORAL | Status: DC

## 2014-12-30 NOTE — Patient Instructions (Signed)
Dr Gwenlyn Found has recommended making the following medication changes: STOP Metoprolol Succ (Toprol-XL) START Metoprolol Tartrate (Lopressor) - take 1 tablet (100 mg total) by mouth twice daily. A prescription has been sent to your pharmacy electronically.  Dr Gwenlyn Found recommends that you schedule a follow-up appointment in 1 year. You will receive a reminder letter in the mail two months in advance. If you don't receive a letter, please call our office to schedule the follow-up appointment.

## 2014-12-30 NOTE — Assessment & Plan Note (Signed)
Long history of PVCs on beta blocker therapy. He does admit to drinking several cups of coffee a day. He also admits to being under a lot of stress.

## 2014-12-30 NOTE — Assessment & Plan Note (Addendum)
history of hypertension with blood pressures measured at 132/72. He is on beta blocker therapy. Continue current medications at current dosing

## 2014-12-30 NOTE — Progress Notes (Signed)
12/30/2014 [name]   [birth year]  [id number]  Primary Physician Andrew Pel, MD Primary Cardiologist: Andrew Harp MD [name]   HPI:   The patient is a 69 year old mildly overweight married Caucasian male father of 2, grandfather to 2 grandchildren who works as an Art gallery manager at [employer]. I last saw him 12 months ago. He has a history of remote tobacco abuse, having smoked cigars in the past, treated hypertension, dyslipidemia. He has never had a heart attack or stroke. He has had bladder and prostate cancer, and he has had prostate surgery. He is followed by Dr. Algis Ford and has been evaluated by Dr. Liam Ford in the past with a Myoview stress test that was normal and an event monitor that showed PVCs for palpitations. He does drink 2-4 cups of coffee a day and has had palpitations since he was a teenager. He saw Dr. Shelia Ford complaining of right-sided chest pain and shortness of breath. He had a CT scan done April 19, 2012 that showed 2 small subsegmental right-sided pulmonary emboli of unclear origin. There is no evidence of DVT. He was placed on Xarelto. Dr. Beryle Ford did a hypercoagulable workup which apparently, according to the patient, was positive for lupus anticoagulant. Dr. Dorina Ford checked his lipid profile recently. He does continue to get palpitations intermittently which he is somewhat symptomatic from. He missed being under a lot of stress. He has atypical chest pain.   Current Outpatient Prescriptions  Medication Sig Dispense Refill  . ALPRAZolam (XANAX) 0.5 MG tablet Take 0.5 mg by mouth 3 (three) times daily as needed.     Marland Kitchen co-enzyme Q-10 30 MG capsule Take 30 mg by mouth daily.      Marland Kitchen diltiazem 2 % GEL Apply 1 application topically 2 (two) times daily. 30 g 2  . fish oil-omega-3 fatty acids 1000 MG capsule Take 1 g by mouth daily.     . furosemide (LASIX) 40 MG tablet Take 40 mg by  mouth. Three times a week    . HYDROcodone-acetaminophen (NORCO/VICODIN) 5-325 MG per tablet Take 1-2 tablets by mouth every 6 (six) hours as needed. 10 tablet 0  . Hypertonic Nasal Wash (SINUS RINSE BOTTLE KIT) PACK Place into the nose at bedtime.      Marland Kitchen KLOR-CON 10 10 MEQ tablet     . methylcellulose (ARTIFICIAL TEARS) 1 % ophthalmic solution Place 2 drops into both eyes daily.      . Multiple Vitamin (MULTIVITAMIN) tablet Take 1 tablet by mouth daily.      . naproxen sodium (ANAPROX) 220 MG tablet Take 440 mg by mouth as needed. Joint pain     . NON FORMULARY CPAP at hs     . nystatin cream (MYCOSTATIN) APPLY 1 APPLICATION TOPICALLY 2 (TWO) TIMES DAILY. 30 g 2  . oxyCODONE-acetaminophen (PERCOCET) 5-325 MG per tablet Take 1 tablet by mouth as needed. pain    . oxymetazoline (AFRIN) 0.05 % nasal spray Place 2 sprays into the nose 2 (two) times daily.      . pantoprazole (PROTONIX) 40 MG tablet Take 40 mg by mouth daily.    . potassium chloride (K-DUR,KLOR-CON) 10 MEQ tablet Take 10 mEq by mouth. Three times a week, takes along with the lasix    . PROCTOSOL HC 2.5 % rectal cream Place 1 application rectally 2 (two) times daily.     . ramipril (ALTACE) 10 MG capsule TAKE 1 CAPSULE (10 MG TOTAL) BY  MOUTH 2 (TWO) TIMES DAILY. 60 capsule 0  . Rivaroxaban (XARELTO) 20 MG TABS Take 20 mg by mouth daily.    Marland Kitchen rOPINIRole (REQUIP) 1 MG tablet 1 mg at bedtime.     . rosuvastatin (CRESTOR) 5 MG tablet Take 1 tablet (5 mg total) by mouth daily. MUST KEEP APPOINTMENT 12/30/2014 WITH DR [name] FOR FUTURE REFILLS 30 tablet 0  . sertraline (ZOLOFT) 50 MG tablet Take 1 tablet by mouth daily.    . sildenafil (VIAGRA) 50 MG tablet Take 100 mg by mouth daily as needed.     . metoprolol (LOPRESSOR) 100 MG tablet Take 1 tablet (100 mg total) by mouth 2 (two) times daily. 60 tablet 5   No current facility-administered medications for this visit.    Allergies  Allergen Reactions  . Aspirin     REACTION: gi upset  with higher dose  . Iohexol      Code: HIVES, Desc: [name] explained to me on 10/25/08 that someone misunderstood him when he spoke about his hives reaction to IV dye.[name] stated that he only had a reaction to MRI IV dye, not CT dye. [name] also explained that he described his "feeling" he had, Onset Date: [id number]   Code: HIVES, Desc: (continued),,"feeling" he had was consistent with what I described as the "normal" feeling most people have during CT IV dye administration. [name] stated he was fine and that the "breath"issue was his anxiousness and not actual dyspnea., Onset Date: [id number]   . Penicillins Itching    History   Social History  . Marital Status: Married    Spouse Name: N/A  . Number of Children: 2  . Years of Education: N/A   Occupational History  . Art gallery manager   . [employer]    Social History Main Topics  . Smoking status: Current Some Day Smoker    Types: Cigars  . Smokeless tobacco: Never Used     Comment: 5 cigars /week--form given 10-24-12  . Alcohol Use: 8.4 oz/week    14 Glasses of wine per week     Comment: 2 glasses wine/day  . Drug Use: No  . Sexual Activity: Not on file   Other Topics Concern  . Not on file   Social History Narrative   Gets regular exercise.   Daily caffeine- 2 cups daily.   Education: [employer]     Review of Systems: General: negative for chills, fever, night sweats or weight changes.  Cardiovascular: negative for chest pain, dyspnea on exertion, edema, orthopnea, palpitations, paroxysmal nocturnal dyspnea or shortness of breath Dermatological: negative for rash Respiratory: negative for cough or wheezing Urologic: negative for hematuria Abdominal: negative for nausea, vomiting, diarrhea, bright red blood per rectum, melena, or hematemesis Neurologic: negative for visual changes, syncope, or dizziness All other systems reviewed and are otherwise negative except as noted above.    Blood pressure  132/72, pulse 64, height 5' 11"  (1.803 m), weight 219 lb 1.6 oz (99.383 kg).  General appearance: alert and no distress Neck: no adenopathy, no carotid bruit, no JVD, supple, symmetrical, trachea midline and thyroid not enlarged, symmetric, no tenderness/mass/nodules Lungs: clear to auscultation bilaterally Heart: regular rate and rhythm, S1, S2 normal, no murmur, click, rub or gallop Extremities: extremities normal, atraumatic, no cyanosis or edema  EKG normal sinus rhythm at 64 without ST or T-wave changes. I personally reviewed this EKG  ASSESSMENT AND PLAN:   Pulmonary embolus History of 2 small pulmonary emboli in the  past on  Xarelto oral anticoagulation. Hypercoagulable workup revealed him to have a lupus anticoagulant.   Premature ventricular contractions Long history of PVCs on beta blocker therapy. He does admit to drinking several cups of coffee a day. He also admits to being under a lot of stress.   Essential hypertension history of hypertension with blood pressures measured at 132/72. He is on beta blocker therapy. Continue current medications at current dosing       Andrew Harp MD [facility], [facility] 12/30/2014 8:15 AM

## 2014-12-30 NOTE — Assessment & Plan Note (Signed)
History of 2 small pulmonary emboli in the past on  Xarelto oral anticoagulation. Hypercoagulable workup revealed him to have a lupus anticoagulant.

## 2015-01-07 ENCOUNTER — Other Ambulatory Visit: Payer: Self-pay | Admitting: Cardiovascular Disease

## 2015-01-07 NOTE — Telephone Encounter (Signed)
Rx has been sent to the pharmacy electronically. ° °

## 2015-01-21 ENCOUNTER — Encounter: Payer: Self-pay | Admitting: Gastroenterology

## 2015-01-26 ENCOUNTER — Other Ambulatory Visit: Payer: Self-pay | Admitting: Cardiovascular Disease

## 2015-02-09 ENCOUNTER — Other Ambulatory Visit: Payer: Self-pay

## 2015-02-22 ENCOUNTER — Other Ambulatory Visit: Payer: Self-pay | Admitting: Cardiovascular Disease

## 2015-02-23 NOTE — Telephone Encounter (Signed)
Rx(s) sent to pharmacy electronically.  

## 2015-02-27 DIAGNOSIS — Z8551 Personal history of malignant neoplasm of bladder: Secondary | ICD-10-CM | POA: Diagnosis not present

## 2015-03-02 ENCOUNTER — Other Ambulatory Visit: Payer: Self-pay | Admitting: Internal Medicine

## 2015-03-02 NOTE — Telephone Encounter (Signed)
May I refill Sir? 

## 2015-03-03 NOTE — Telephone Encounter (Signed)
Refill x6

## 2015-03-12 ENCOUNTER — Telehealth: Payer: Self-pay | Admitting: Cardiovascular Disease

## 2015-03-12 NOTE — Telephone Encounter (Signed)
Closed enocunter °

## 2015-03-17 ENCOUNTER — Encounter: Payer: Self-pay | Admitting: Cardiovascular Disease

## 2015-03-17 ENCOUNTER — Ambulatory Visit (INDEPENDENT_AMBULATORY_CARE_PROVIDER_SITE_OTHER): Payer: Medicare Other | Admitting: Cardiovascular Disease

## 2015-03-17 VITALS — BP 140/82 | HR 72 | Ht 71.0 in | Wt 225.8 lb

## 2015-03-17 DIAGNOSIS — I700 Atherosclerosis of aorta: Secondary | ICD-10-CM | POA: Diagnosis not present

## 2015-03-17 DIAGNOSIS — R010 Benign and innocent cardiac murmurs: Secondary | ICD-10-CM

## 2015-03-17 DIAGNOSIS — R011 Cardiac murmur, unspecified: Secondary | ICD-10-CM

## 2015-03-17 DIAGNOSIS — I493 Ventricular premature depolarization: Secondary | ICD-10-CM

## 2015-03-17 DIAGNOSIS — IMO0001 Reserved for inherently not codable concepts without codable children: Secondary | ICD-10-CM

## 2015-03-17 DIAGNOSIS — I119 Hypertensive heart disease without heart failure: Secondary | ICD-10-CM

## 2015-03-17 DIAGNOSIS — I2699 Other pulmonary embolism without acute cor pulmonale: Secondary | ICD-10-CM | POA: Diagnosis not present

## 2015-03-17 NOTE — Assessment & Plan Note (Signed)
History of pulmonary embolism in the past documented by CT scan performed 04/19/12 with 2 small subsegmental right-sided pulmonary emboli of unclear origin. Lower extremity venous Dopplers were normal. A hypercoagulable workup performed by Dr. Beryle Beams revealed a lupus anticoagulated and he was subsequently placed on Xarelto oral anticoagulation.

## 2015-03-17 NOTE — Assessment & Plan Note (Signed)
History of hypertension with blood pressure measured today at 140/82. He is on diltiazem, metoprolol. Continue current meds at current dosing

## 2015-03-17 NOTE — Progress Notes (Signed)
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 I last saw him 12/30/14.Marland Kitchen He has a history of remote tobacco abuse, having smoked cigars in the past, treated hypertension, dyslipidemia. He has never had a heart attack or stroke. He has had bladder and prostate cancer, and he has had prostate surgery. He is followed by Dr. Algis Greenhouse and has been evaluated by Dr. Liam Rogers in the past with a Myoview stress test that was normal and an event monitor that showed PVCs for palpitations. He does drink 2-4 cups of coffee a day and has had palpitations since he was a teenager. He saw [name] complaining of right-sided chest pain and shortness of breath. He had a CT scan done April 19, 2012 that showed 2 small subsegmental right-sided pulmonary emboli of unclear origin. There is no evidence of DVT. He was placed on Xarelto. Dr. Beryle Beams did a hypercoagulable workup which apparently, according to the patient, was positive for lupus anticoagulant. [name] checked his lipid profile recently. He does continue to get palpitations intermittently which he is somewhat symptomatic from.He has atypical chest pain.   Current Outpatient Prescriptions  Medication Sig Dispense Refill  . ALPRAZolam (XANAX) 0.5 MG tablet Take 0.5 mg by mouth 3 (three) times daily as needed.     Marland Kitchen co-enzyme Q-10 30 MG capsule Take 30 mg by mouth daily.      . CRESTOR 5 MG tablet TAKE 1 TABLET BY MOUTH EVERY DAY 30 tablet 8  . diltiazem 2 % GEL Apply 1 application topically 2 (two) times daily. 30 g 2  . DiltiaZEM HCl POWD APPLY TO AFFECTED AREA TWICE A DAY. 30 g 5  . fish oil-omega-3 fatty acids 1000 MG  capsule Take 1 g by mouth daily.     . furosemide (LASIX) 40 MG tablet Take 40 mg by mouth. Three times a week    . HYDROcodone-acetaminophen (NORCO/VICODIN) 5-325 MG per tablet Take 1-2 tablets by mouth every 6 (six) hours as needed. 10 tablet 0  . Hypertonic Nasal Wash (SINUS RINSE BOTTLE KIT) PACK Place into the nose at bedtime.      Marland Kitchen KLOR-CON 10 10 MEQ tablet     . methylcellulose (ARTIFICIAL TEARS) 1 % ophthalmic solution Place 2 drops into both eyes daily.      . metoprolol (LOPRESSOR) 100 MG tablet Take 1 tablet (100 mg total) by mouth 2 (two) times daily. 60 tablet 5  . Multiple Vitamin (MULTIVITAMIN) tablet Take 1 tablet by mouth daily.      . naproxen sodium (ANAPROX) 220 MG tablet Take 440 mg by mouth as needed. Joint pain     . NON FORMULARY CPAP at hs     . nystatin cream (MYCOSTATIN) APPLY 1 APPLICATION TOPICALLY 2 (TWO) TIMES DAILY. 30 g 2  . oxymetazoline (AFRIN) 0.05 % nasal spray Place 2 sprays into the nose 2 (two) times daily.      . pantoprazole (PROTONIX) 40 MG tablet Take 40 mg by mouth daily.    . potassium chloride (K-DUR,KLOR-CON) 10 MEQ tablet Take 10 mEq by mouth. Three times a week, takes along with the lasix    . ramipril (ALTACE) 10 MG capsule TAKE ONE CAPSULE  BY MOUTH TWICE A DAY 60 capsule 11  . Rivaroxaban (XARELTO) 20 MG TABS Take 20 mg by mouth daily.    Marland Kitchen rOPINIRole (REQUIP) 1 MG tablet 1 mg at bedtime.     . sertraline (ZOLOFT) 50 MG tablet Take 1 tablet by mouth daily.    . sildenafil (VIAGRA) 50 MG tablet Take 100 mg by mouth daily as needed.      No current facility-administered medications for this visit.    Allergies  Allergen Reactions  . Aspirin     REACTION: gi upset with higher dose  . Iohexol      Code: HIVES, Desc: Mr. Gamino explained to me on 10/25/08 that someone misunderstood him when he spoke about his hives reaction to IV dye.Mr. Varnell stated that he only had a reaction to MRI IV dye, not CT dye. Mr. Mckim also explained that he  described his "feeling" he had, Onset Date: [id number]   Code: HIVES, Desc: (continued),,"feeling" he had was consistent with what I described as the "normal" feeling most people have during CT IV dye administration. Mr. Hazelrigg stated he was fine and that the "breath"issue was his anxiousness and not actual dyspnea., Onset Date: [id number]   . Penicillins Itching    History   Social History  . Marital Status: Married    Spouse Name: N/A  . Number of Children: 2  . Years of Education: N/A   Occupational History  . Art gallery manager   . [employer]    Social History Main Topics  . Smoking status: Current Some Day Smoker    Types: Cigars  . Smokeless tobacco: Never Used     Comment: 5 cigars /week--form given 10-24-12  . Alcohol Use: 8.4 oz/week    14 Glasses of wine per week     Comment: 2 glasses wine/day  . Drug Use: No  . Sexual Activity: Not on file   Other Topics Concern  . Not on file   Social History Narrative   Gets regular exercise.   Daily caffeine- 2 cups daily.   Education: [employer]     Review of Systems: General: negative for chills, fever, night sweats or weight changes.  Cardiovascular: negative for chest pain, dyspnea on exertion, edema, orthopnea, palpitations, paroxysmal nocturnal dyspnea or shortness of breath Dermatological: negative for rash Respiratory: negative for cough or wheezing Urologic: negative for hematuria Abdominal: negative for nausea, vomiting, diarrhea, bright red blood per rectum, melena, or hematemesis Neurologic: negative for visual changes, syncope, or dizziness All other systems reviewed and are otherwise negative except as noted above.    Blood pressure 140/82, pulse 72, height 5' 11"  (1.803 m), weight 225 lb 12.8 oz (102.422 kg).  General appearance: alert and no distress Neck: no adenopathy, no carotid bruit, no JVD, supple, symmetrical, trachea midline and thyroid not enlarged, symmetric, no  tenderness/mass/nodules Lungs: clear to auscultation bilaterally Heart: regular rate and rhythm, S1, S2 normal, no murmur, click, rub or gallop Extremities: extremities normal, atraumatic, no cyanosis or edema  EKG not performed today  ASSESSMENT AND PLAN:   SYSTOLIC MURMUR History of systolic murmur with 2-D echocardiogram performed 05/16/12 which showed aortic sclerosis without stenosis.  Pulmonary embolus History of pulmonary embolism in the past documented by CT scan performed 04/19/12 with 2 small subsegmental right-sided pulmonary emboli of unclear origin. Lower extremity venous Dopplers were normal. A hypercoagulable workup performed by Dr. Beryle Beams revealed a lupus anticoagulated and he was subsequently placed on Xarelto oral anticoagulation.  Premature ventricular contractions  Long history of symptomatic PVCs since young adulthood demonstrated on event monitoring. He is on metoprolol 100 mg by mouth twice a day. He has drastically limit his caffeine intake. He does feel his PVCs. I have reassured him.  Hypertensive heart disease without heart failure History of hypertension with blood pressure measured today at 140/82. He is on diltiazem, metoprolol. Continue current meds at current dosing  HYPERLIPIDEMIA History of hyperlipidemia on Crestor 5 mg a day followed by his PCP      [name] MD [facility], [facility] 03/17/2015 10:56 AM

## 2015-03-17 NOTE — Assessment & Plan Note (Signed)
History of hyperlipidemia on Crestor 5 mg a day followed by his PCP 

## 2015-03-17 NOTE — Assessment & Plan Note (Signed)
History of systolic murmur with 2-D echocardiogram performed 05/16/12 which showed aortic sclerosis without stenosis.

## 2015-03-17 NOTE — Patient Instructions (Signed)
  We will see you back in follow up in 1 year with Dr Gwenlyn Found.   Dr Gwenlyn Found has ordered:  Echocardiogram. Echocardiography is a painless test that uses sound waves to create images of your heart. It provides your doctor with information about the size and shape of your heart and how well your heart's chambers and valves are working. This procedure takes approximately one hour. There are no restrictions for this procedure.

## 2015-03-17 NOTE — Assessment & Plan Note (Signed)
Long history of symptomatic PVCs since young adulthood demonstrated on event monitoring. He is on metoprolol 100 mg by mouth twice a day. He has drastically limit his caffeine intake. He does feel his PVCs. I have reassured him.

## 2015-03-23 ENCOUNTER — Ambulatory Visit (HOSPITAL_COMMUNITY): Payer: Medicare Other | Attending: Cardiology

## 2015-03-23 ENCOUNTER — Other Ambulatory Visit: Payer: Self-pay

## 2015-03-23 DIAGNOSIS — I351 Nonrheumatic aortic (valve) insufficiency: Secondary | ICD-10-CM | POA: Diagnosis not present

## 2015-03-23 DIAGNOSIS — I071 Rheumatic tricuspid insufficiency: Secondary | ICD-10-CM | POA: Diagnosis not present

## 2015-03-23 DIAGNOSIS — I77819 Aortic ectasia, unspecified site: Secondary | ICD-10-CM | POA: Insufficient documentation

## 2015-03-23 DIAGNOSIS — I358 Other nonrheumatic aortic valve disorders: Secondary | ICD-10-CM | POA: Diagnosis present

## 2015-03-23 DIAGNOSIS — I340 Nonrheumatic mitral (valve) insufficiency: Secondary | ICD-10-CM | POA: Insufficient documentation

## 2015-03-23 DIAGNOSIS — IMO0001 Reserved for inherently not codable concepts without codable children: Secondary | ICD-10-CM

## 2015-03-23 DIAGNOSIS — I700 Atherosclerosis of aorta: Secondary | ICD-10-CM | POA: Diagnosis not present

## 2015-03-24 ENCOUNTER — Telehealth: Payer: Self-pay | Admitting: *Deleted

## 2015-03-24 DIAGNOSIS — I351 Nonrheumatic aortic (valve) insufficiency: Secondary | ICD-10-CM

## 2015-03-24 NOTE — Telephone Encounter (Signed)
-----   Message from [name], MD sent at 03/23/2015  2:15 PM EDT ----- AI has increased from mild to mod over past 3 years. Mild dilatation of Thoracic Ao Root. Repeat 12 months

## 2015-03-24 NOTE — Telephone Encounter (Signed)
Order placed for repeat echo in 1 year 

## 2015-04-06 ENCOUNTER — Emergency Department (HOSPITAL_COMMUNITY): Payer: Medicare Other

## 2015-04-06 ENCOUNTER — Encounter (HOSPITAL_COMMUNITY): Payer: Self-pay | Admitting: Vascular Surgery

## 2015-04-06 DIAGNOSIS — I10 Essential (primary) hypertension: Secondary | ICD-10-CM | POA: Diagnosis not present

## 2015-04-06 DIAGNOSIS — S199XXA Unspecified injury of neck, initial encounter: Secondary | ICD-10-CM | POA: Diagnosis not present

## 2015-04-06 DIAGNOSIS — Z86718 Personal history of other venous thrombosis and embolism: Secondary | ICD-10-CM | POA: Diagnosis not present

## 2015-04-06 DIAGNOSIS — Z86711 Personal history of pulmonary embolism: Secondary | ICD-10-CM | POA: Diagnosis not present

## 2015-04-06 DIAGNOSIS — E669 Obesity, unspecified: Secondary | ICD-10-CM | POA: Insufficient documentation

## 2015-04-06 DIAGNOSIS — Z880 Allergy status to penicillin: Secondary | ICD-10-CM | POA: Insufficient documentation

## 2015-04-06 DIAGNOSIS — Y9389 Activity, other specified: Secondary | ICD-10-CM | POA: Insufficient documentation

## 2015-04-06 DIAGNOSIS — Z8639 Personal history of other endocrine, nutritional and metabolic disease: Secondary | ICD-10-CM | POA: Insufficient documentation

## 2015-04-06 DIAGNOSIS — K219 Gastro-esophageal reflux disease without esophagitis: Secondary | ICD-10-CM | POA: Diagnosis not present

## 2015-04-06 DIAGNOSIS — S0990XA Unspecified injury of head, initial encounter: Secondary | ICD-10-CM | POA: Diagnosis not present

## 2015-04-06 DIAGNOSIS — Z8546 Personal history of malignant neoplasm of prostate: Secondary | ICD-10-CM | POA: Insufficient documentation

## 2015-04-06 DIAGNOSIS — Y998 Other external cause status: Secondary | ICD-10-CM | POA: Diagnosis not present

## 2015-04-06 DIAGNOSIS — Z79899 Other long term (current) drug therapy: Secondary | ICD-10-CM | POA: Diagnosis not present

## 2015-04-06 DIAGNOSIS — F419 Anxiety disorder, unspecified: Secondary | ICD-10-CM | POA: Diagnosis not present

## 2015-04-06 DIAGNOSIS — G4730 Sleep apnea, unspecified: Secondary | ICD-10-CM | POA: Diagnosis not present

## 2015-04-06 DIAGNOSIS — Z791 Long term (current) use of non-steroidal anti-inflammatories (NSAID): Secondary | ICD-10-CM | POA: Insufficient documentation

## 2015-04-06 DIAGNOSIS — R51 Headache: Secondary | ICD-10-CM | POA: Diagnosis not present

## 2015-04-06 DIAGNOSIS — Y92410 Unspecified street and highway as the place of occurrence of the external cause: Secondary | ICD-10-CM | POA: Insufficient documentation

## 2015-04-06 DIAGNOSIS — F329 Major depressive disorder, single episode, unspecified: Secondary | ICD-10-CM | POA: Insufficient documentation
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
## 2015-04-06 NOTE — ED Notes (Signed)
Tenderness to C-spine palpation. C- collar applied.

## 2015-04-07 ENCOUNTER — Emergency Department (HOSPITAL_COMMUNITY)
Admission: EM | Admit: 2015-04-07 | Discharge: 2015-04-07 | Disposition: A | Discharge status: Home / Self Care | Payer: Medicare Other | Attending: Emergency Medicine | Admitting: Emergency Medicine

## 2015-04-07 DIAGNOSIS — S0990XA Unspecified injury of head, initial encounter: Secondary | ICD-10-CM

## 2015-04-07 MED ORDER — METHOCARBAMOL 500 MG PO TABS
1000.0000 mg | ORAL_TABLET | Freq: Once | ORAL | Status: AC
  Administered 2015-04-07: 1000 mg via ORAL
  Filled 2015-04-07: qty 2

## 2015-04-07 MED ORDER — METHOCARBAMOL 500 MG PO TABS: 1000.0000 mg | ORAL_TABLET | Freq: Three times a day (TID) | ORAL | Status: DC | PRN

## 2015-04-07 NOTE — ED Notes (Signed)
Pt removed c-collar; ambulatory with steady gait around exam room; awaiting provider
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
## 2015-04-08 ENCOUNTER — Other Ambulatory Visit (HOSPITAL_COMMUNITY): Payer: Medicare Other

## 2015-05-27 DIAGNOSIS — Z23 Encounter for immunization: Secondary | ICD-10-CM | POA: Diagnosis not present

## 2015-05-28 ENCOUNTER — Telehealth: Payer: Self-pay | Admitting: Oncology

## 2015-05-28 NOTE — Telephone Encounter (Signed)
[facility] - moved 10/27 appointments. Spoke with patient re change and new appointment date/time for lab 11/28 and FS 11/30.

## 2015-06-05 DIAGNOSIS — G4733 Obstructive sleep apnea (adult) (pediatric): Secondary | ICD-10-CM | POA: Diagnosis not present

## 2015-06-09 ENCOUNTER — Other Ambulatory Visit: Payer: Medicare Other

## 2015-06-11 ENCOUNTER — Ambulatory Visit: Payer: Medicare Other | Admitting: Oncology

## 2015-06-11 ENCOUNTER — Ambulatory Visit: Payer: Medicare Other

## 2015-06-12 DIAGNOSIS — Z1283 Encounter for screening for malignant neoplasm of skin: Secondary | ICD-10-CM | POA: Diagnosis not present

## 2015-06-12 DIAGNOSIS — L918 Other hypertrophic disorders of the skin: Secondary | ICD-10-CM | POA: Diagnosis not present

## 2015-07-10 ENCOUNTER — Other Ambulatory Visit: Payer: Self-pay | Admitting: Cardiovascular Disease

## 2015-07-13 ENCOUNTER — Other Ambulatory Visit (HOSPITAL_BASED_OUTPATIENT_CLINIC_OR_DEPARTMENT_OTHER): Payer: Medicare Other

## 2015-07-13 ENCOUNTER — Other Ambulatory Visit: Payer: Self-pay | Admitting: *Deleted

## 2015-07-13 DIAGNOSIS — Z8546 Personal history of malignant neoplasm of prostate: Secondary | ICD-10-CM

## 2015-07-13 DIAGNOSIS — C679 Malignant neoplasm of bladder, unspecified: Secondary | ICD-10-CM

## 2015-07-13 LAB — CBC WITH DIFFERENTIAL/PLATELET
BASO%: 0.4 % (ref 0.0–2.0)
BASOS ABS: 0 10*3/uL (ref 0.0–0.1)
EOS%: 3 % (ref 0.0–7.0)
Eosinophils Absolute: 0.2 10*3/uL (ref 0.0–0.5)
HEMATOCRIT: 43.8 % (ref 38.4–49.9)
HGB: 14.8 g/dL (ref 13.0–17.1)
LYMPH#: 1.7 10*3/uL (ref 0.9–3.3)
LYMPH%: 31.5 % (ref 14.0–49.0)
MCH: 29.9 pg (ref 27.2–33.4)
MCHC: 33.7 g/dL (ref 32.0–36.0)
MCV: 88.6 fL (ref 79.3–98.0)
MONO#: 0.5 10*3/uL (ref 0.1–0.9)
MONO%: 8.7 % (ref 0.0–14.0)
NEUT#: 3.1 10*3/uL (ref 1.5–6.5)
NEUT%: 56.4 % (ref 39.0–75.0)
PLATELETS: 170 10*3/uL (ref 140–400)
RBC: 4.95 10*6/uL (ref 4.20–5.82)
RDW: 14.1 % (ref 11.0–14.6)
WBC: 5.5 10*3/uL (ref 4.0–10.3)

## 2015-07-13 LAB — COMPREHENSIVE METABOLIC PANEL (CC13)
ALT: 59 U/L — ABNORMAL HIGH (ref 0–55)
AST: 48 U/L — ABNORMAL HIGH (ref 5–34)
Albumin: 4 g/dL (ref 3.5–5.0)
Alkaline Phosphatase: 69 U/L (ref 40–150)
Anion Gap: 10 mEq/L (ref 3–11)
BUN: 13.1 mg/dL (ref 7.0–26.0)
CALCIUM: 9.5 mg/dL (ref 8.4–10.4)
CHLORIDE: 106 meq/L (ref 98–109)
CO2: 25 mEq/L (ref 22–29)
Creatinine: 1.1 mg/dL (ref 0.7–1.3)
EGFR: 66 mL/min/{1.73_m2} — ABNORMAL LOW (ref >90)
Glucose: 98 mg/dl (ref 70–140)
POTASSIUM: 4.1 meq/L (ref 3.5–5.1)
SODIUM: 140 meq/L (ref 136–145)
Total Bilirubin: 0.98 mg/dL (ref 0.20–1.20)
Total Protein: 7 g/dL (ref 6.4–8.3)

## 2015-07-14 LAB — PSA

## 2015-07-15 ENCOUNTER — Other Ambulatory Visit: Payer: Self-pay

## 2015-07-15 ENCOUNTER — Ambulatory Visit (HOSPITAL_BASED_OUTPATIENT_CLINIC_OR_DEPARTMENT_OTHER): Payer: Medicare Other | Admitting: Oncology

## 2015-07-15 ENCOUNTER — Telehealth: Payer: Self-pay | Admitting: Oncology

## 2015-07-15 VITALS — BP 163/83 | HR 68 | Temp 98.2°F | Resp 18 | Ht 71.0 in | Wt 232.2 lb

## 2015-07-15 DIAGNOSIS — C679 Malignant neoplasm of bladder, unspecified: Secondary | ICD-10-CM

## 2015-07-15 DIAGNOSIS — Z86718 Personal history of other venous thrombosis and embolism: Secondary | ICD-10-CM | POA: Diagnosis not present

## 2015-07-15 DIAGNOSIS — R0789 Other chest pain: Secondary | ICD-10-CM

## 2015-07-15 DIAGNOSIS — C61 Malignant neoplasm of prostate: Secondary | ICD-10-CM | POA: Diagnosis not present

## 2015-07-15 NOTE — Telephone Encounter (Signed)
per pof to sch pt appt-gave pt copy of avs-gave contrast °

## 2015-07-15 NOTE — Progress Notes (Signed)
Hematology and Oncology Follow Up Visit  [name] [id number] 1946-05-12 69 y.o. 07/15/2015 8:48 AM [name], MDPharr, Andrew Jew, MD   Principle Diagnosis: 69 year old gentleman diagnosed with prostate cancer in 2010. He had a Gleason score 3+3 = 6. He also had a DVT and was anticoagulated for total 6 months.   Prior Therapy: Status post prostatectomy done on 10/20/2008.  Current therapy: Observation and surveillance.   Interim History: [name] presents today for a follow-up visit. Since the last visit, he continues to do well for the most part. He did report increased back pain, flank pain and right-sided chest pain in the last 3 weeks. He does not recall any trauma but he does report lifting heavy object and have had pain since that time. He denied any neurological deficits or weakness. His appetite is excellent and have gained more weight. He does take occasionally Norco help with his pain. His pain has improved slightly in the last week or so.  He has not reported any thrombosis or bleeding complications. He denied any hematuria, dysuria or genitourinary complaints.   He does not report any headaches or blurry vision double vision or syncope. Does not report any fevers chills or sweats. Does not report any chest pain, palpitation or orthopnea. Does not report any nausea, vomiting abdominal pain. Does not report any frequency urgency or hesitancy. He does not report any skeletal complaints. Rest of his review of systems unremarkable.  Medications: I have reviewed the patient's current medications.  Current Outpatient Prescriptions  Medication Sig Dispense Refill  . ALPRAZolam (XANAX) 0.5 MG tablet Take 0.5 mg by mouth 3 (three) times daily as needed.     Marland Kitchen co-enzyme Q-10 30 MG capsule Take 30 mg by mouth daily.      . CRESTOR 5 MG tablet TAKE 1 TABLET BY MOUTH EVERY DAY 30 tablet 8  . diltiazem 2 % GEL Apply 1 application topically 2 (two) times daily. 30 g 2  . fish  oil-omega-3 fatty acids 1000 MG capsule Take 1 g by mouth daily.     . furosemide (LASIX) 40 MG tablet Take 40 mg by mouth. Three times a week    . HYDROcodone-acetaminophen (NORCO/VICODIN) 5-325 MG per tablet Take 1-2 tablets by mouth every 6 (six) hours as needed. 10 tablet 0  . Hypertonic Nasal Wash (SINUS RINSE BOTTLE KIT) PACK Place into the nose at bedtime.      Marland Kitchen KLOR-CON 10 10 MEQ tablet     . methylcellulose (ARTIFICIAL TEARS) 1 % ophthalmic solution Place 2 drops into both eyes daily.      . metoprolol (LOPRESSOR) 100 MG tablet TAKE 1 TABLET (100 MG TOTAL) BY MOUTH 2 (TWO) TIMES DAILY. 60 tablet 5  . Multiple Vitamin (MULTIVITAMIN) tablet Take 1 tablet by mouth daily.      . naproxen sodium (ANAPROX) 220 MG tablet Take 440 mg by mouth as needed. Joint pain     . nystatin cream (MYCOSTATIN) APPLY 1 APPLICATION TOPICALLY 2 (TWO) TIMES DAILY. 30 g 2  . oxymetazoline (AFRIN) 0.05 % nasal spray Place 2 sprays into the nose 2 (two) times daily.      . pantoprazole (PROTONIX) 40 MG tablet Take 40 mg by mouth daily.    . potassium chloride (K-DUR,KLOR-CON) 10 MEQ tablet Take 10 mEq by mouth. Three times a week, takes along with the lasix    . ramipril (ALTACE) 10 MG capsule TAKE ONE CAPSULE BY MOUTH TWICE A DAY 60 capsule 11  .  Rivaroxaban (XARELTO) 20 MG TABS Take 20 mg by mouth daily.    Marland Kitchen rOPINIRole (REQUIP) 1 MG tablet 1 mg at bedtime.     . sertraline (ZOLOFT) 50 MG tablet Take 1 tablet by mouth daily.    . sildenafil (VIAGRA) 50 MG tablet Take 100 mg by mouth daily as needed.     . NON FORMULARY CPAP at hs      No current facility-administered medications for this visit.     Allergies:  Allergies  Allergen Reactions  . Aspirin Other (See Comments)    REACTION: gi upset with higher dose  . Penicillins Itching  . Iohexol      Code: HIVES, Desc: [name] explained to me on 10/25/08 that someone misunderstood him when he spoke about his hives reaction to IV dye.[name] stated  that he only had a reaction to MRI IV dye, not CT dye. [name] also explained that he described his "feeling" he had, Onset Date: [id number]   Code: HIVES, Desc: (continued),,"feeling" he had was consistent with what I described as the "normal" feeling most people have during CT IV dye administration. [name] stated he was fine and that the "breath"issue was his anxiousness and not actual dyspnea., Onset Date: [id number]     Past Medical History, Surgical history, Social history, and Family History were reviewed and updated.  Physical Exam: Blood pressure 163/83, pulse 68, temperature 98.2 F (36.8 C), temperature source Oral, resp. rate 18, height 5' 11" (1.803 m), weight 232 lb 3.2 oz (105.325 kg), SpO2 99 %. ECOG: 0 General appearance: alert and cooperative did not appear in any distress. Head: Normocephalic, without obvious abnormality no oral ulcers or lesions. Neck: no adenopathy Lymph nodes: Cervical, supraclavicular, and axillary nodes normal. Heart:regular rate and rhythm, S1, S2 normal, no murmur, click, rub or gallop Lung:chest clear, no wheezing, rales, normal symmetric air entry. No rhonchi or dullness to percussion. Chest wall examination: No erythema, induration or rash. Abdomin: soft, non-tender, without masses or organomegaly no shifting dullness or ascites. EXT:no erythema, induration, or nodules   Lab Results: Lab Results  Component Value Date   WBC 5.5 07/13/2015   HGB 14.8 07/13/2015   HCT 43.8 07/13/2015   MCV 88.6 07/13/2015   PLT 170 07/13/2015     Chemistry      Component Value Date/Time   NA 140 07/13/2015 0829   NA 140 06/22/2011 1005   K 4.1 07/13/2015 0829   K 4.0 06/22/2011 1005   CL 103 11/02/2012 0737   CL 101 06/22/2011 1005   CO2 25 07/13/2015 0829   CO2 31 06/22/2011 1005   BUN 13.1 07/13/2015 0829   BUN 18 06/22/2011 1005   CREATININE 1.1 07/13/2015 0829   CREATININE 1.1 06/22/2011 1005      Component Value Date/Time   CALCIUM 9.5  07/13/2015 0829   CALCIUM 9.3 06/22/2011 1005   ALKPHOS 69 07/13/2015 0829   ALKPHOS 55 06/22/2011 1005   AST 48* 07/13/2015 0829   AST 32 06/22/2011 1005   ALT 59* 07/13/2015 0829   ALT 38 06/22/2011 1005   BILITOT 0.98 07/13/2015 0829   BILITOT 0.9 06/22/2011 1005     Results for GWYNN, CROSSLEY (MRN [id number]) as of 07/15/2015 08:27  Ref. Range 11/02/2012 07:37 06/06/2014 08:34 07/13/2015 08:30  PSA Latest Ref Range: <=4.00 ng/mL <0.01 <0.01 <0.01     Impression and Plan:   69 year old gentleman with the following issues:  1. Prostate cancer diagnosed in February  2010. It is Gleason score 3+3 = 6. He is status post robotic prostatectomy done on 10/20/2008. His last PSA on 07/13/2015 was less than 0.01 indicating disease in remission at this time.  2. History of superficial bladder tumor status post TURBT and continues to follow with urology regarding this issue.  3. Flank pain and right-sided chest pain: Unclear etiology but likely skeletal in nature. Metastatic malignancy cannot be ruled out at this time given history of prostate cancer and bladder cancer it would be reasonable to obtain imaging studies to rule out. If his imaging studies are negative, this likely represents muscular in nature and no further intervention is needed.  4. History of DVT: He is no longer anticoagulated at this time.  5. Follow-up: Will be in 1 year unless his CT scan is abnormal.   Atha Muradyan, MD 11/30/20168:48 AM

## 2015-07-21 ENCOUNTER — Ambulatory Visit (HOSPITAL_COMMUNITY)
Admission: RE | Admit: 2015-07-21 | Discharge: 2015-07-21 | Disposition: A | Discharge status: Home / Self Care | Payer: Medicare Other | Source: Ambulatory Visit | Attending: Oncology | Admitting: Oncology

## 2015-07-21 DIAGNOSIS — K5730 Diverticulosis of large intestine without perforation or abscess without bleeding: Secondary | ICD-10-CM | POA: Insufficient documentation

## 2015-07-21 DIAGNOSIS — R110 Nausea: Secondary | ICD-10-CM | POA: Insufficient documentation

## 2015-07-21 DIAGNOSIS — R109 Unspecified abdominal pain: Secondary | ICD-10-CM | POA: Insufficient documentation

## 2015-07-21 DIAGNOSIS — K760 Fatty (change of) liver, not elsewhere classified: Secondary | ICD-10-CM | POA: Insufficient documentation

## 2015-07-21 DIAGNOSIS — C61 Malignant neoplasm of prostate: Secondary | ICD-10-CM | POA: Diagnosis not present

## 2015-07-21 DIAGNOSIS — C679 Malignant neoplasm of bladder, unspecified: Secondary | ICD-10-CM | POA: Insufficient documentation

## 2015-07-21 DIAGNOSIS — I712 Thoracic aortic aneurysm, without rupture: Secondary | ICD-10-CM | POA: Diagnosis not present

## 2015-07-21 DIAGNOSIS — R079 Chest pain, unspecified: Secondary | ICD-10-CM | POA: Diagnosis not present

## 2015-07-21 MED ORDER — IOHEXOL 300 MG/ML  SOLN
100.0000 mL | Freq: Once | INTRAMUSCULAR | Status: AC | PRN
  Administered 2015-07-21: 100 mL via INTRAVENOUS

## 2015-07-22 ENCOUNTER — Telehealth: Payer: Self-pay | Admitting: *Deleted

## 2015-07-22 NOTE — Telephone Encounter (Signed)
-----   Message from Wyatt Portela, MD sent at 07/22/2015  8:32 AM EST ----- Please let him know that his scan is normal.

## 2015-07-22 NOTE — Telephone Encounter (Signed)
Spoke with patient, per dr Alen Blew, scan was normal.

## 2015-09-01 DIAGNOSIS — H18411 Arcus senilis, right eye: Secondary | ICD-10-CM | POA: Diagnosis not present

## 2015-09-01 DIAGNOSIS — H18412 Arcus senilis, left eye: Secondary | ICD-10-CM | POA: Diagnosis not present

## 2015-09-01 DIAGNOSIS — H02839 Dermatochalasis of unspecified eye, unspecified eyelid: Secondary | ICD-10-CM | POA: Diagnosis not present

## 2015-09-01 DIAGNOSIS — H2512 Age-related nuclear cataract, left eye: Secondary | ICD-10-CM | POA: Diagnosis not present

## 2015-09-03 DIAGNOSIS — E7800 Pure hypercholesterolemia, unspecified: Secondary | ICD-10-CM | POA: Diagnosis not present

## 2015-09-03 DIAGNOSIS — Z125 Encounter for screening for malignant neoplasm of prostate: Secondary | ICD-10-CM | POA: Diagnosis not present

## 2015-09-03 DIAGNOSIS — I10 Essential (primary) hypertension: Secondary | ICD-10-CM | POA: Diagnosis not present

## 2015-09-08 DIAGNOSIS — C61 Malignant neoplasm of prostate: Secondary | ICD-10-CM | POA: Diagnosis not present

## 2015-09-08 DIAGNOSIS — G4733 Obstructive sleep apnea (adult) (pediatric): Secondary | ICD-10-CM | POA: Diagnosis not present

## 2015-09-08 DIAGNOSIS — K625 Hemorrhage of anus and rectum: Secondary | ICD-10-CM | POA: Diagnosis not present

## 2015-09-08 DIAGNOSIS — M545 Low back pain: Secondary | ICD-10-CM | POA: Diagnosis not present

## 2015-09-08 DIAGNOSIS — B351 Tinea unguium: Secondary | ICD-10-CM | POA: Diagnosis not present

## 2015-09-14 DIAGNOSIS — G4733 Obstructive sleep apnea (adult) (pediatric): Secondary | ICD-10-CM | POA: Diagnosis not present

## 2015-09-16 ENCOUNTER — Ambulatory Visit: Payer: Medicare Other | Admitting: Physician Assistant

## 2015-09-29 DIAGNOSIS — B351 Tinea unguium: Secondary | ICD-10-CM | POA: Diagnosis not present

## 2015-10-12 DIAGNOSIS — Z8546 Personal history of malignant neoplasm of prostate: Secondary | ICD-10-CM | POA: Diagnosis not present

## 2015-10-20 DIAGNOSIS — B351 Tinea unguium: Secondary | ICD-10-CM | POA: Diagnosis not present

## 2015-11-11 ENCOUNTER — Ambulatory Visit (INDEPENDENT_AMBULATORY_CARE_PROVIDER_SITE_OTHER): Payer: Medicare Other | Admitting: Internal Medicine

## 2015-11-11 ENCOUNTER — Encounter: Payer: Self-pay | Admitting: Internal Medicine

## 2015-11-11 VITALS — BP 130/80 | HR 68 | Ht 71.0 in | Wt 231.0 lb

## 2015-11-11 DIAGNOSIS — R1013 Epigastric pain: Secondary | ICD-10-CM | POA: Diagnosis not present

## 2015-11-11 DIAGNOSIS — K648 Other hemorrhoids: Secondary | ICD-10-CM | POA: Diagnosis not present

## 2015-11-11 DIAGNOSIS — K610 Anal abscess: Secondary | ICD-10-CM | POA: Diagnosis not present

## 2015-11-11 DIAGNOSIS — K601 Chronic anal fissure: Secondary | ICD-10-CM

## 2015-11-11 DIAGNOSIS — M159 Polyosteoarthritis, unspecified: Secondary | ICD-10-CM

## 2015-11-11 DIAGNOSIS — M150 Primary generalized (osteo)arthritis: Secondary | ICD-10-CM

## 2015-11-11 MED ORDER — DICLOFENAC SODIUM 1 % TD GEL: 4.0000 g | Freq: Four times a day (QID) | TRANSDERMAL | Status: DC

## 2015-11-11 NOTE — Progress Notes (Signed)
Subjective:    Patient ID: [name], male    DOB: [birth year], 70 y.o.   MRN: [id number] He complaint: Rectal bleeding, epigastric pain HPI [name] is here, he is having some persistent rectal pain and bleeding. He has been treated for chronic anal fissure. He still using diltiazem and rectal care. He notes a nodule he has felt one of those before and I thought it was from his fissure. Bleeding is minor and intermittent, he does take Xarelto because of that antiphospholipid antibody syndrome.  He also describes a pressure in his epigastrium and a little bit to the left of the xiphoid. Probably worse when he lifts things. It is not affected by eating. Allergies  Allergen Reactions  . Aspirin Other (See Comments)    REACTION: gi upset with higher dose  . Penicillins Itching   Outpatient Prescriptions Prior to Visit  Medication Sig Dispense Refill  . ALPRAZolam (XANAX) 0.5 MG tablet Take 0.5 mg by mouth 3 (three) times daily as needed.     Marland Kitchen co-enzyme Q-10 30 MG capsule Take 30 mg by mouth daily.      . CRESTOR 5 MG tablet TAKE 1 TABLET BY MOUTH EVERY DAY 30 tablet 8  . diltiazem 2 % GEL Apply 1 application topically 2 (two) times daily. 30 g 2  . fish oil-omega-3 fatty acids 1000 MG capsule Take 1 g by mouth daily.     . furosemide (LASIX) 40 MG tablet Take 40 mg by mouth. Three times a week    . Hypertonic Nasal Wash (SINUS RINSE BOTTLE KIT) PACK Place into the nose at bedtime.      Marland Kitchen KLOR-CON 10 10 MEQ tablet     . metoprolol (LOPRESSOR) 100 MG tablet TAKE 1 TABLET (100 MG TOTAL) BY MOUTH 2 (TWO) TIMES DAILY. 60 tablet 5  . Multiple Vitamin (MULTIVITAMIN) tablet Take 1 tablet by mouth daily.      . NON FORMULARY CPAP at hs     . oxymetazoline (AFRIN) 0.05 % nasal spray Place 2 sprays into the nose 2 (two) times daily.      . pantoprazole (PROTONIX) 40 MG tablet Take 40 mg by mouth daily.    . ramipril (ALTACE) 10 MG capsule TAKE ONE CAPSULE BY MOUTH TWICE A DAY 60 capsule 11  .  Rivaroxaban (XARELTO) 20 MG TABS Take 20 mg by mouth daily.    Marland Kitchen rOPINIRole (REQUIP) 1 MG tablet 1 mg at bedtime.     . sertraline (ZOLOFT) 50 MG tablet Take 100 mg by mouth daily.     . sildenafil (VIAGRA) 50 MG tablet Take 100 mg by mouth daily as needed.     Marland Kitchen HYDROcodone-acetaminophen (NORCO/VICODIN) 5-325 MG per tablet Take 1-2 tablets by mouth every 6 (six) hours as needed. 10 tablet 0  . methylcellulose (ARTIFICIAL TEARS) 1 % ophthalmic solution Place 2 drops into both eyes daily.      . naproxen sodium (ANAPROX) 220 MG tablet Take 440 mg by mouth as needed. Joint pain     . nystatin cream (MYCOSTATIN) APPLY 1 APPLICATION TOPICALLY 2 (TWO) TIMES DAILY. 30 g 2  . potassium chloride (K-DUR,KLOR-CON) 10 MEQ tablet Take 10 mEq by mouth. Three times a week, takes along with the lasix     No facility-administered medications prior to visit.   Past Medical History  Diagnosis Date  . HTN (hypertension)   . GERD (gastroesophageal reflux disease)   . Prostate cancer ([place])   . DVT (deep  venous thrombosis) ([place])     secondary to prostate surgery  . Chest pain   . Aortic dilatation ([facility])   . Pulmonary embolism ([place]) 2011  . History of echocardiogram 10/30/2009    EF 55-60%  . Tricuspid regurgitation 02/08/2008    trace  . Mitral regurgitation 10/30/2009    trace  . Apnea, sleep     uses cpap at hs  . Hyperlipidemia   . Hiatal hernia   . Esophageal stricture   . Depression   . Cataract   . PVCs (premature ventricular contractions)   . Pulmonary embolus ([place]) 11/11/2011    10/25/08 5 days post prostatectomy for prostate ca  . DVT, lower extremity, distal 11/11/2011    RLE DVT & PE 5 days post prostatectomy 10/25/08  . Antiphospholipid antibody syndrome ([place]) 05/22/2012    Found subsequent to recurrent PE 9/13  . Fatty liver   . Mild aortic insufficiency   . Chronic anxiety   . Lupus anticoagulant positive   . ED (erectile dysfunction)   . RLS (restless legs syndrome)   . Obesity   .  History of TMJ syndrome   . Chronic anal fissure   . Hx of echocardiogram 05/2012    EF >55%   . History of stress test 06/2011    Normal stress nuclear EF 56%   Past Surgical History  Procedure Laterality Date  . Prostate surgery    . Tonsillectomy    . Appendectomy    . Eye surgery Bilateral   . Bladder surgery    . Cholecystectomy    . Esophagogastroduodenoscopy    . Colonoscopy     Social History   Social History  . Marital Status: Married    Spouse Name: N/A  . Number of Children: 2  . Years of Education: N/A   Occupational History  . Art gallery manager   . FI[employer]PLICATIONS    Social History Main Topics  . Smoking status: Current Some Day Smoker    Types: Cigars  . Smokeless tobacco: Never Used     Comment: 5 cigars /week--form given 10-24-12  . Alcohol Use: 8.4 oz/week    14 Glasses of wine per week     Comment: 2 glasses wine/day  . Drug Use: No  . Sexual Activity: Not Asked   Other Topics Concern  . None   Social History Narrative   Gets regular exercise.   Daily caffeine- 2 cups daily.   Education: [employer]   Family History  Problem Relation Age of Onset  . Hypertension Mother   . Hypertension Father   . Stroke Father   . Diabetes Maternal Grandmother   . Colon polyps Brother   . Colon polyps Father   . Crohn's disease Brother   . Heart disease Maternal Grandmother   . Cirrhosis      [employer]  . Colon cancer Father     questionable   Review of Systems As above    Objective:   Physical Exam @BP  130/80 mmHg  Pulse 68  Ht 5' 11"  (1.803 m)  Wt 231 lb (104.781 kg)  BMI 32.23 kg/m2@  General:  NAD Eyes:   anicteric Lungs:  clear Heart::  S1S2 no rubs, murmurs or gallops Abdomen:  soft and tender just below the xiphoid into the last. He is also tender over the ribs in the left upper costal margin near the midline. The pain is worse with muscle tension. There is no mass. BS+  Rectal: Anoderm  inspection reveals a small draining  abscess at about 9 to 10:00 in the left lateral decubitus position. It's almost midline. It is firm and I can express some he could purulence. It's pea-sized. It seems superficial. The digital exam reveals some chronic fissuring of the posterior anal canal I think. It is not tender like it has been in the past.   Anoscopy is performed also in this demonstrates grade 1-2 internal hemorrhoids in all positions.  Ext:   no edema, cyanosis or clubbing    Data Reviewed:   As per history of present illness. He had a colonoscopy by Dr. Sharlett Iles 2013 with some hemorrhoids otherwise negative     Assessment & Plan:   1. Perianal abscess   2. Chronic anal fissure   3. Internal bleeding hemorrhoids   4. Abdominal wall pain in epigastric region   5. Primary osteoarthritis involving multiple joints    He has a small perianal abscess. Surgical evaluation and drainage of this seems appropriate hopefully this can be accomplished with local and in the office. He does takes Xarelto, it may be that he can just avoid taking it the day of his appointment and they can treat his abscess  in the office. As an appointment to see Dr. Johney Maine, his other surgeon Dr. Marlou Starks is not seeing patients with perianal disease we are told.  He does have some hemorrhoidal think this is where most if not all of his bleeding is coming from. He could have banding though the Xarelto issue would affect that so we are holding off on that.  Inc. he has musculoskeletal abdominal wall pain and probably or possibly some arthritis in and around the rib and sternal joints. Since he takes Xarelto, on oral and state is not good for him. He was reassured today. He's had an upper endoscopy on 1 and 1 occasion as well as had chronic recurrent epigastric pain without findings to explain that on EGD. Topical N Sayed diclofenac gel is prescribed hopefully we can get that through any can use that gets some relief.  I appreciate the opportunity to care  for this patient. CC: [name], MD  Cleophus Molt.D.

## 2015-11-11 NOTE — Patient Instructions (Addendum)
You have been given a separate informational sheet regarding your tobacco use, the importance of quitting and local resources to help you quit.   We have sent the following medications to your pharmacy for you to pick up at your convenience: Generic Voltaren gel  You have been scheduled for an appointment with Dr Johney Maine at [facility]. Your appointment is on 11/30/15 at 8;45AM. Please arrive at 8:15AM for registration. Make certain to bring a list of current medications, including any over the counter medications or vitamins. Also bring your co-pay if you have one as well as your insurance cards. [facility] is located at [address], [address]. Should you need to reschedule your appointment, please contact them at [phone].  Follow up with Dr Carlean Purl as needed.  I appreciate the opportunity to care for you.

## 2015-11-12 ENCOUNTER — Telehealth: Payer: Self-pay

## 2015-11-12 NOTE — Telephone Encounter (Signed)
The diclofenac gel was denied, please advise Sir, thank you.

## 2015-11-12 NOTE — Telephone Encounter (Signed)
Spoke to rep named Ever at [facility] [phone] to do prior authorization for the diclofenac sodium 1% gel for dx: abdominal wall pain in epigastric region , R10.13.  Patient is on xarelto and therefore cannot take oral NSAIDS.  We will await their response.  Case # [id number].

## 2015-11-13 NOTE — Telephone Encounter (Signed)
I recommend he get an OTC cream/ointment that contains trolamine salicylate and try that

## 2015-11-13 NOTE — Telephone Encounter (Signed)
Spoke with patient and informed him what to get and try OTC for his pain, he wrote the name down.

## 2015-11-30 ENCOUNTER — Other Ambulatory Visit: Payer: Self-pay | Admitting: Surgery

## 2015-11-30 DIAGNOSIS — K603 Anal fistula: Secondary | ICD-10-CM | POA: Diagnosis not present

## 2015-11-30 NOTE — H&P (Signed)
Manual [place] 11/30/2015 8:50 AM Location: [facility] Patient #: [id number] DOB: [birth year] Married / Language: English / Race: White Male  History of Present Illness Andrew Hector MD; 11/30/2015 9:32 AM) The patient is a 70 year old male who presents with a perirectal abscess. Note for "Perirectal abscess": Patient sent for surgical consultation by Dr Silvano Rusk for perirectal abscess versus fissure.  Pleasant gentleman comes today with persistent rectal drainage. Probable abscess. Prostate cancer survivor status post robotic prostatectomy 2010. No recurrence. Developed pulmonary embolism. Than a recurrence. Found to have lupus anticoagulant. Seem consistent with the fact that his father had PE as well. Now on chronic Xerelto. Uses a treadmill every day 30-68min & er active. Normal ejection fraction on echocardiogram a few years ago.  Question of irritable bowel the past since he's had some occasional constipation and cramping. Switched to high fiber diet and occasional prune juice usually moves his bowels every day. Every other day feels mildly constipated. Normal colonoscopy 2013. Has had intermittent anal pain in the past few years followed by Dr. Carlean Purl with [facility] gastroenterology. Probable fissure improved with diltiazem cream last year. Patient noted persistent anal pain and drainage. No abscess. He should made for office incision and drainage. Comes 3 weeks later after that appointment. He notes he's having less drainage but still has chronic discomfort and has drainage pretty regularly. Patient has some mild hemorrhoids with some occasional bleeding on the [facility] but nothing very severe. Never had any anorectal treatments.  No personal nor family history of GI/colon cancer, inflammatory bowel disease, allergy such as Celiac Sprue, dietary/dairy problems, colitis, ulcers nor gastritis. No recent sick contacts/gastroenteritis. No travel outside the  country. No changes in diet. No dysphagia to solids or liquids. No significant heartburn or reflux. No major hematochezia, hematemesis, coffee ground emesis. No evidence of prior gastric/peptic ulceration.   Other Problems [name], CMA; 11/30/2015 8:50 AM) Anxiety Disorder Arthritis Asthma Back Pain Bladder Problems Cerebrovascular Accident Chest pain Cholelithiasis Depression Diverticulosis Enlarged Prostate Gastroesophageal Reflux Disease Heart murmur Hemorrhoids High blood pressure Hypercholesterolemia Inguinal Hernia Prostate Cancer Pulmonary Embolism / Blood Clot in Legs Sleep Apnea  Past Surgical History [name], CMA; 11/30/2015 8:50 AM) Colon Polyp Removal - Colonoscopy Gallbladder Surgery - Laparoscopic Open Inguinal Hernia Surgery Left. Oral Surgery Prostate Surgery - Removal Tonsillectomy  Diagnostic Studies History [name], CMA; 11/30/2015 8:50 AM) Colonoscopy 5-10 years ago  Allergies [name], CMA; 11/30/2015 8:51 AM) Aspirin Adult Low Dose *ANALGESICS - NonNarcotic* Penicillin G Potassium *PENICILLINS*  Medication History [name], CMA; 11/30/2015 8:53 AM) ALPRAZolam (0.5MG  Tablet, Oral) Active. Crestor (5MG  Tablet, Oral) Active. Furosemide (40MG  Tablet, Oral) Active. Klor-Con 10 (10MEQ Tablet ER, Oral) Active. Metoprolol Tartrate (100MG  Tablet, Oral two times daily) Active. Xarelto (20MG  Tablet, Oral) Active. Ramipril (10MG  Capsule, Oral two times daily) Active. Pantoprazole Sodium (40MG  Tablet DR, Oral) Active. Sertraline HCl (50MG  Tablet, Oral) Active. Viagra (50MG  Tablet, Oral as needed) Active. ROPINIRole HCl (1MG  Tablet, Oral) Active. Voltaren (1% Gel, Transdermal) Active. Fish Oil (1000MG  Capsule, Oral) Active. Multiple Vitamin (Oral) Active. Medications Reconciled  Social History [name], [place]; 11/30/2015 8:50 AM) Alcohol use Moderate alcohol use. Caffeine use  Carbonated beverages, Coffee, Tea. No drug use Tobacco use Current some day smoker.  Family History [name], [place]; 11/30/2015 8:50 AM) Alcohol Abuse Father. Anesthetic complications Family Members In General. Arthritis Brother, Father, Mother, Son. Cancer Father. Cerebrovascular Accident Father, Mother. Colon Polyps Father. Depression Brother, Mother. Heart disease in male family member  before age 52 Hypertension Father, Mother. Ischemic Bowel Disease Father. Thyroid problems Brother, Son.     Review of Systems [name] CMA; 11/30/2015 8:50 AM) General Present- Chills and Night Sweats. Not Present- Appetite Loss, Fatigue, Fever, Weight Gain and Weight Loss. Skin Present- Dryness, Non-Healing Wounds and Rash. Not Present- Change in Wart/Mole, Hives, Jaundice, New Lesions and Ulcer. HEENT Present- Oral Ulcers, Seasonal Allergies, Sinus Pain, Visual Disturbances and Wears glasses/contact lenses. Not Present- Earache, Hearing Loss, Hoarseness, Nose Bleed, Ringing in the Ears, Sore Throat and Yellow Eyes. Respiratory Present- Snoring. Not Present- Bloody sputum, Chronic Cough, Difficulty Breathing and Wheezing. Breast Present- Breast Pain. Not Present- Breast Mass, Nipple Discharge and Skin Changes. Cardiovascular Present- Chest Pain, Difficulty Breathing Lying Down, Palpitations and Shortness of Breath. Not Present- Leg Cramps, Rapid Heart Rate and Swelling of Extremities. Gastrointestinal Present- Abdominal Pain, Bloating, Bloody Stool, Constipation, Excessive gas, Hemorrhoids, Indigestion, Nausea and Rectal Pain. Not Present- Change in Bowel Habits, Chronic diarrhea, Difficulty Swallowing, Gets full quickly at meals and Vomiting. Male Genitourinary Present- Urine Leakage. Not Present- Blood in Urine, Change in Urinary Stream, Frequency, Impotence, Nocturia, Painful Urination and Urgency. Musculoskeletal Present- Back Pain, Joint Pain, Joint Stiffness and Muscle Pain.  Not Present- Muscle Weakness and Swelling of Extremities. Neurological Present- Numbness, Tingling and Weakness. Not Present- Decreased Memory, Fainting, Headaches, Seizures, Tremor and Trouble walking. Psychiatric Present- Anxiety and Depression. Not Present- Bipolar, Change in Sleep Pattern, Fearful and Frequent crying. Endocrine Present- Cold Intolerance. Not Present- Excessive Hunger, Hair Changes, Heat Intolerance, Hot flashes and New Diabetes. Hematology Present- Excessive bleeding. Not Present- Easy Bruising, Gland problems, HIV and Persistent Infections.  Vitals [name] CMA; 11/30/2015 8:54 AM) 11/30/2015 8:53 AM Weight: 212.6 lb Height: 72in Body Surface Area: 2.19 m Body Mass Index: 28.83 kg/m  Temp.: 10F  Pulse: 67 (Regular)  BP: 150/92 (Sitting, Left Arm, Standard)      Physical Exam Andrew Hector MD; 11/30/2015 9:13 AM)  General Mental Status-Alert. General Appearance-Not in acute distress, Not Sickly. Orientation-Oriented X3. Hydration-Well hydrated. Voice-Normal.  Integumentary Global Assessment Upon inspection and palpation of skin surfaces of the - Axillae: non-tender, no inflammation or ulceration, no drainage. and Distribution of scalp and body hair is normal. General Characteristics Temperature - normal warmth is noted.  Head and Neck Head-normocephalic, atraumatic with no lesions or palpable masses. Face Global Assessment - atraumatic, no absence of expression. Neck Global Assessment - no abnormal movements, no bruit auscultated on the right, no bruit auscultated on the left, no decreased range of motion, non-tender. Trachea-midline. Thyroid Gland Characteristics - non-tender.  Eye Eyeball - Left-Extraocular movements intact, No Nystagmus. Eyeball - Right-Extraocular movements intact, No Nystagmus. Cornea - Left-No Hazy. Cornea - Right-No Hazy. Sclera/Conjunctiva - Left-No scleral icterus, No  Discharge. Sclera/Conjunctiva - Right-No scleral icterus, No Discharge. Pupil - Left-Direct reaction to light normal. Pupil - Right-Direct reaction to light normal.  ENMT Ears Pinna - Left - no drainage observed, no generalized tenderness observed. Right - no drainage observed, no generalized tenderness observed. Nose and Sinuses External Inspection of the Nose - no destructive lesion observed. Inspection of the nares - Left - quiet respiration. Right - quiet respiration. Mouth and Throat Lips - Upper Lip - no fissures observed, no pallor noted. Lower Lip - no fissures observed, no pallor noted. Nasopharynx - no discharge present. Oral Cavity/Oropharynx - Tongue - no dryness observed. Oral Mucosa - no cyanosis observed. Hypopharynx - no evidence of airway distress observed.  Chest and Lung Exam Inspection  Movements - Normal and Symmetrical. Accessory muscles - No use of accessory muscles in breathing. Palpation Palpation of the chest reveals - Non-tender. Auscultation Breath sounds - Normal and Clear.  Cardiovascular Auscultation Rhythm - Regular. Murmurs & Other Heart Sounds - Auscultation of the heart reveals - No Murmurs and No Systolic Clicks.  Abdomen Inspection Inspection of the abdomen reveals - No Visible peristalsis and No Abnormal pulsations. Umbilicus - No Bleeding, No Urine drainage. Palpation/Percussion Palpation and Percussion of the abdomen reveal - Soft, Non Tender, No Rebound tenderness, No Rigidity (guarding) and No Cutaneous hyperesthesia. Note: Thin abdominal wall. Soft and flat. No major diastases nor umbilical hernia.  Male Genitourinary Sexual Maturity Tanner 5 - Adult hair pattern and Adult penile size and shape. Note: Normal external genitalia. Epididymi, testes, and spermatic cords normal without any masses. No inguinal hernias.  Rectal Note: Posterior midline slightly right-sided (7 o'clock in lithtomy) sinus 1.5 cm from the anal verge. Cord  goes up to the sphincters. Digital exam produces some purulence especially inside but some outside as well. Anoscopy notes purulence posterior midline as well. Seems consistent with classic perirectal fistula. No active abscess or cellulitis. No fissure.    Exam done with assistance of male Medical Assistant in the room. Perianal skin clean with good hygiene. No pruritis ani. No pilonidal disease. No fissure. Normal sphincter tone. Prostate absent consistent with prior prostatectomy. Tolerates digital and anoscopic rectal exam. No rectal masses. External hemorrhoids: No.  Hemorrhoidal piles: Grade 2 right anterior, right posterior greater than left lateral internal hemorrhoids. Bleeding or ulceration.  Peripheral Vascular Upper Extremity Inspection - Left - No Cyanotic nailbeds, Not Ischemic. Right - No Cyanotic nailbeds, Not Ischemic.  Neurologic Neurologic evaluation reveals -normal attention span and ability to concentrate, able to name objects and repeat phrases. Appropriate fund of knowledge , normal sensation and normal coordination. Mental Status Affect - not angry, not paranoid. Cranial Nerves-Normal Bilaterally. Gait-Normal.  Neuropsychiatric Mental status exam performed with findings of-able to articulate well with normal speech/language, rate, volume and coherence, thought content normal with ability to perform basic computations and apply abstract reasoning and no evidence of hallucinations, delusions, obsessions or homicidal/suicidal ideation.  Musculoskeletal Global Assessment Spine, Ribs and Pelvis - no instability, subluxation or laxity. Right Upper Extremity - no instability, subluxation or laxity.  Lymphatic Head & Neck  General Head & Neck Lymphatics: Bilateral - Description - No Localized lymphadenopathy. Axillary  General Axillary Region: Bilateral - Description - No Localized lymphadenopathy. Femoral & Inguinal  Generalized Femoral & Inguinal  Lymphatics: Left - Description - No Localized lymphadenopathy. Right - Description - No Localized lymphadenopathy.   Results Andrew Hector MD; 11/30/2015 9:28 AM) Procedures  Name Value Date Hemorrhoids Procedure Internal exam: Internal Hemorroids ( non-bleeding) Other: Posterior midline slightly right-sided (7 o'clock in lithtomy) sinus 1.5 cm from the anal verge. Cord goes up to the sphincters. Digital exam produces some purulence especially inside but some outside as well. Anoscopy notes purulence posterior midline as well. Seems consistent with classic perirectal fistula. No active abscess or cellulitis. No fissure. Exam done with assistance of male Medical Assistant in the room. Perianal skin clean with good hygiene. No pruritis ani. No pilonidal disease. No fissure.  Normal sphincter tone. Prostate absent consistent with prior prostatectomy. Tolerates digital and anoscopic rectal exam. No rectal masses. External hemorrhoids: No. Hemorrhoidal piles: Grade 2 right anterior, right posterior greater than left lateral internal hemorrhoids. Bleeding or ulceration.  Performed: 11/30/2015 9:14 AM    Assessment &  Plan Andrew Hector MD; 11/30/2015 9:28 AM)  ANAL FISTULA (K60.3) Impression: Chronic posterior anal fistula that most likely explains the intermittent, chronic anal discomfort for the past few years.  I think he would benefit from examination under anesthesia with probable LIFT repair versus fistulotomy. I cannot feel a cord superficial to the sphincter, so probably may be a intersphincteric. We will see. He has good exercise tolerance. Probably reasonable hold his Xerelto 48 hours before surgery and restart the next day to have a short window to avoid recurrent pulmonary embolism given his history of lupus anticoagulant.  He is interested in proceeding with surgery. We will work to coordinate a convenient time.  Current Plans You are being scheduled for  surgery - Our schedulers will call you.  You should hear from our office's scheduling department within 5 working days about the location, date, and time of surgery. We try to make accommodations for patient's preferences in scheduling surgery, but sometimes the OR schedule or the surgeon's schedule prevents [place] from making those accommodations.  If you have not heard from our office [phone]) in 5 working days, call the office and ask for your surgeon's nurse.  If you have other questions about your diagnosis, plan, or surgery, call the office and ask for your surgeon's nurse.  The anatomy & physiology of the anorectal region was discussed. We discussed the pathophysiology of anorectal abscess and fistula. Differential diagnosis was discussed. Natural history progression was discussed. I stressed the importance of a bowel regimen to have daily soft bowel movements to minimize progression of disease.  The patient's condition is not adequately controlled. Non-operative treatment has not healed the fistula. Therefore, I recommended examination under anaesthesia to confirm the diagnosis and treat the fistula. I discussed techniques that may be required such as fistulotomy, ligation by LIFT technique, and/or seton placement. Benefits & alternatives discussed. I noted a good likelihood this will help address the problem, but sometimes repeat operations and prolonged healing times may occur. Risks such as bleeding, pain, recurrence, reoperation, incontinence, heart attack, death, and other risks were discussed.  Educational handouts further explaining the pathology, treatment options, and bowel regimen were given. The patient expressed understanding & wishes to proceed. We will work to coordinate surgery for a mutually convenient time.  Pt Education - [facility] Abscess/Fistula (AT): discussed with patient and provided information. Pt Education - [facility] Rectal Prep for Anorectal outpatient/office surgery:  discussed with patient and provided information. Pt Education - [facility] Rectal Surgery HCI (Jerric Oyen): discussed with patient and provided information. ANOSCOPY, DIAGNOSTIC [id number]) Pt Education - [facility] Hemorrhoids (Herta Hink): discussed with patient and provided information. Pt Education - [facility] Pelvic Floor Exercises (Kegels) and Dysfunction HCI (Cheyrl Buley)  [name], M.D., F.A.C.S. Gastrointestinal and Minimally Invasive Surgery [facility] [address] [place], [place] [address] [phone] Main / Paging

## 2015-11-30 NOTE — H&P (Signed)
[name] 11/30/2015 8:50 AM Location: [facility] Patient #: [id number] DOB: 05/19/1946 Married / Language: English / Race: White Male   History of Present Illness Andrew Hector MD; 11/30/2015 9:32 AM) The patient is a 70 year old male who presents with a perirectal abscess. Note for "Perirectal abscess": Patient sent for surgical consultation by Dr Silvano Rusk for perirectal abscess versus fissure.  Pleasant gentleman comes today with persistent rectal drainage. Probable abscess. Prostate cancer survivor status post robotic prostatectomy 2010. No recurrence. Developed pulmonary embolism. Than a recurrence. Found to have lupus anticoagulant. Seem consistent with the fact that his father had PE as well. Now on chronic Xerelto. Uses a treadmill every day 30-93min & er active. Normal ejection fraction on echocardiogram a few years ago.  Question of irritable bowel the past since he's had some occasional constipation and cramping. Switched to high fiber diet and occasional prune juice usually moves his bowels every day. Every other day feels mildly constipated. Normal colonoscopy 2013. Has had intermittent anal pain in the past few years followed by Dr. Carlean Purl with [facility] gastroenterology. Probable fissure improved with diltiazem cream last year. Patient noted persistent anal pain and drainage. No abscess. He should made for office incision and drainage. Comes 3 weeks later after that appointment. He notes he's having less drainage but still has chronic discomfort and has drainage pretty regularly. Patient has some mild hemorrhoids with some occasional bleeding on the [facility] but nothing very severe. Never had any anorectal treatments.  No personal nor family history of GI/colon cancer, inflammatory bowel disease, allergy such as Celiac Sprue, dietary/dairy problems, colitis, ulcers nor gastritis. No recent sick contacts/gastroenteritis. No travel outside  the country. No changes in diet. No dysphagia to solids or liquids. No significant heartburn or reflux. No major hematochezia, hematemesis, coffee ground emesis. No evidence of prior gastric/peptic ulceration.   Other Problems [name], CMA; 11/30/2015 8:50 AM) Anxiety Disorder Arthritis Asthma Back Pain Bladder Problems Cerebrovascular Accident Chest pain Cholelithiasis Depression Diverticulosis Enlarged Prostate Gastroesophageal Reflux Disease Heart murmur Hemorrhoids High blood pressure Hypercholesterolemia Inguinal Hernia Prostate Cancer Pulmonary Embolism / Blood Clot in Legs Sleep Apnea  Past Surgical History [name], CMA; 11/30/2015 8:50 AM) Colon Polyp Removal - Colonoscopy Gallbladder Surgery - Laparoscopic Open Inguinal Hernia Surgery Left. Oral Surgery Prostate Surgery - Removal Tonsillectomy  Diagnostic Studies History [name], CMA; 11/30/2015 8:50 AM) Colonoscopy 5-10 years ago  Allergies [name], CMA; 11/30/2015 8:51 AM) Aspirin Adult Low Dose *ANALGESICS - NonNarcotic* Penicillin G Potassium *PENICILLINS*  Medication History [name], CMA; 11/30/2015 8:53 AM) ALPRAZolam (0.5MG  Tablet, Oral) Active. Crestor (5MG  Tablet, Oral) Active. Furosemide (40MG  Tablet, Oral) Active. Klor-Con 10 (10MEQ Tablet ER, Oral) Active. Metoprolol Tartrate (100MG  Tablet, Oral two times daily) Active. Xarelto (20MG  Tablet, Oral) Active. Ramipril (10MG  Capsule, Oral two times daily) Active. Pantoprazole Sodium (40MG  Tablet DR, Oral) Active. Sertraline HCl (50MG  Tablet, Oral) Active. Viagra (50MG  Tablet, Oral as needed) Active. ROPINIRole HCl (1MG  Tablet, Oral) Active. Voltaren (1% Gel, Transdermal) Active. Fish Oil (1000MG  Capsule, Oral) Active. Multiple Vitamin (Oral) Active. Medications Reconciled  Social History [name], [place]; 11/30/2015 8:50 AM) Alcohol use Moderate alcohol use. Caffeine use  Carbonated beverages, Coffee, Tea. No drug use Tobacco use Current some day smoker.  Family History [name], [place]; 11/30/2015 8:50 AM) Alcohol Abuse Father. Anesthetic complications Family Members In General. Arthritis Brother, Father, Mother, Son. Cancer Father. Cerebrovascular Accident Father, Mother. Colon Polyps Father. Depression Brother, Mother. Heart disease in male family  member before age 30 Hypertension Father, Mother. Ischemic Bowel Disease Father. Thyroid problems Brother, Son.    Review of Systems [name] CMA; 11/30/2015 8:50 AM) General Present- Chills and Night Sweats. Not Present- Appetite Loss, Fatigue, Fever, Weight Gain and Weight Loss. Skin Present- Dryness, Non-Healing Wounds and Rash. Not Present- Change in Wart/Mole, Hives, Jaundice, New Lesions and Ulcer. HEENT Present- Oral Ulcers, Seasonal Allergies, Sinus Pain, Visual Disturbances and Wears glasses/contact lenses. Not Present- Earache, Hearing Loss, Hoarseness, Nose Bleed, Ringing in the Ears, Sore Throat and Yellow Eyes. Respiratory Present- Snoring. Not Present- Bloody sputum, Chronic Cough, Difficulty Breathing and Wheezing. Breast Present- Breast Pain. Not Present- Breast Mass, Nipple Discharge and Skin Changes. Cardiovascular Present- Chest Pain, Difficulty Breathing Lying Down, Palpitations and Shortness of Breath. Not Present- Leg Cramps, Rapid Heart Rate and Swelling of Extremities. Gastrointestinal Present- Abdominal Pain, Bloating, Bloody Stool, Constipation, Excessive gas, Hemorrhoids, Indigestion, Nausea and Rectal Pain. Not Present- Change in Bowel Habits, Chronic diarrhea, Difficulty Swallowing, Gets full quickly at meals and Vomiting. Male Genitourinary Present- Urine Leakage. Not Present- Blood in Urine, Change in Urinary Stream, Frequency, Impotence, Nocturia, Painful Urination and Urgency. Musculoskeletal Present- Back Pain, Joint Pain, Joint Stiffness and Muscle Pain. Not  Present- Muscle Weakness and Swelling of Extremities. Neurological Present- Numbness, Tingling and Weakness. Not Present- Decreased Memory, Fainting, Headaches, Seizures, Tremor and Trouble walking. Psychiatric Present- Anxiety and Depression. Not Present- Bipolar, Change in Sleep Pattern, Fearful and Frequent crying. Endocrine Present- Cold Intolerance. Not Present- Excessive Hunger, Hair Changes, Heat Intolerance, Hot flashes and New Diabetes. Hematology Present- Excessive bleeding. Not Present- Easy Bruising, Gland problems, HIV and Persistent Infections.  Vitals [name] CMA; 11/30/2015 8:54 AM) 11/30/2015 8:53 AM Weight: 212.6 lb Height: 72in Body Surface Area: 2.19 m Body Mass Index: 28.83 kg/m  Temp.: 49F  Pulse: 67 (Regular)  BP: 150/92 (Sitting, Left Arm, Standard)       Physical Exam Andrew Hector MD; 11/30/2015 9:13 AM) General Mental Status-Alert. General Appearance-Not in acute distress, Not Sickly. Orientation-Oriented X3. Hydration-Well hydrated. Voice-Normal.  Integumentary Global Assessment Upon inspection and palpation of skin surfaces of the - Axillae: non-tender, no inflammation or ulceration, no drainage. and Distribution of scalp and body hair is normal. General Characteristics Temperature - normal warmth is noted.  Head and Neck Head-normocephalic, atraumatic with no lesions or palpable masses. Face Global Assessment - atraumatic, no absence of expression. Neck Global Assessment - no abnormal movements, no bruit auscultated on the right, no bruit auscultated on the left, no decreased range of motion, non-tender. Trachea-midline. Thyroid Gland Characteristics - non-tender.  Eye Eyeball - Left-Extraocular movements intact, No Nystagmus. Eyeball - Right-Extraocular movements intact, No Nystagmus. Cornea - Left-No Hazy. Cornea - Right-No Hazy. Sclera/Conjunctiva - Left-No scleral icterus, No  Discharge. Sclera/Conjunctiva - Right-No scleral icterus, No Discharge. Pupil - Left-Direct reaction to light normal. Pupil - Right-Direct reaction to light normal.  ENMT Ears Pinna - Left - no drainage observed, no generalized tenderness observed. Right - no drainage observed, no generalized tenderness observed. Nose and Sinuses External Inspection of the Nose - no destructive lesion observed. Inspection of the nares - Left - quiet respiration. Right - quiet respiration. Mouth and Throat Lips - Upper Lip - no fissures observed, no pallor noted. Lower Lip - no fissures observed, no pallor noted. Nasopharynx - no discharge present. Oral Cavity/Oropharynx - Tongue - no dryness observed. Oral Mucosa - no cyanosis observed. Hypopharynx - no evidence of airway distress observed.  Chest and Lung Exam Inspection  Movements - Normal and Symmetrical. Accessory muscles - No use of accessory muscles in breathing. Palpation Palpation of the chest reveals - Non-tender. Auscultation Breath sounds - Normal and Clear.  Cardiovascular Auscultation Rhythm - Regular. Murmurs & Other Heart Sounds - Auscultation of the heart reveals - No Murmurs and No Systolic Clicks.  Abdomen Inspection Inspection of the abdomen reveals - No Visible peristalsis and No Abnormal pulsations. Umbilicus - No Bleeding, No Urine drainage. Palpation/Percussion Palpation and Percussion of the abdomen reveal - Soft, Non Tender, No Rebound tenderness, No Rigidity (guarding) and No Cutaneous hyperesthesia. Note: Thin abdominal wall. Soft and flat. No major diastases nor umbilical hernia.   Male Genitourinary Sexual Maturity Tanner 5 - Adult hair pattern and Adult penile size and shape. Note: Normal external genitalia. Epididymi, testes, and spermatic cords normal without any masses. No inguinal hernias.   Rectal Note: Posterior midline slightly right-sided (7 o'clock in lithtomy) sinus 1.5 cm from the anal verge.  Cord goes up to the sphincters. Digital exam produces some purulence especially inside but some outside as well. Anoscopy notes purulence posterior midline as well. Seems consistent with classic perirectal fistula. No active abscess or cellulitis. No fissure.    Exam done with assistance of male Medical Assistant in the room. Perianal skin clean with good hygiene. No pruritis ani. No pilonidal disease. No fissure. Normal sphincter tone. Prostate absent consistent with prior prostatectomy. Tolerates digital and anoscopic rectal exam. No rectal masses. External hemorrhoids: No.  Hemorrhoidal piles: Grade 2 right anterior, right posterior greater than left lateral internal hemorrhoids. Bleeding or ulceration.   Peripheral Vascular Upper Extremity Inspection - Left - No Cyanotic nailbeds, Not Ischemic. Right - No Cyanotic nailbeds, Not Ischemic.  Neurologic Neurologic evaluation reveals -normal attention span and ability to concentrate, able to name objects and repeat phrases. Appropriate fund of knowledge , normal sensation and normal coordination. Mental Status Affect - not angry, not paranoid. Cranial Nerves-Normal Bilaterally. Gait-Normal.  Neuropsychiatric Mental status exam performed with findings of-able to articulate well with normal speech/language, rate, volume and coherence, thought content normal with ability to perform basic computations and apply abstract reasoning and no evidence of hallucinations, delusions, obsessions or homicidal/suicidal ideation.  Musculoskeletal Global Assessment Spine, Ribs and Pelvis - no instability, subluxation or laxity. Right Upper Extremity - no instability, subluxation or laxity.  Lymphatic Head & Neck  General Head & Neck Lymphatics: Bilateral - Description - No Localized lymphadenopathy. Axillary  General Axillary Region: Bilateral - Description - No Localized lymphadenopathy. Femoral & Inguinal  Generalized Femoral & Inguinal  Lymphatics: Left - Description - No Localized lymphadenopathy. Right - Description - No Localized lymphadenopathy.   Results Andrew Hector MD; 11/30/2015 9:28 AM) Procedures  Name Value Date Hemorrhoids Procedure Internal exam: Internal Hemorroids ( non-bleeding) Other: Posterior midline slightly right-sided (7 o'clock in lithtomy) sinus 1.5 cm from the anal verge. Cord goes up to the sphincters. Digital exam produces some purulence especially inside but some outside as well. Anoscopy notes purulence posterior midline as well. Seems consistent with classic perirectal fistula. No active abscess or cellulitis. No fissure. Exam done with assistance of male Medical Assistant in the room. Perianal skin clean with good hygiene. No pruritis ani. No pilonidal disease. No fissure.  Normal sphincter tone. Prostate absent consistent with prior prostatectomy. Tolerates digital and anoscopic rectal exam. No rectal masses. External hemorrhoids: No. Hemorrhoidal piles: Grade 2 right anterior, right posterior greater than left lateral internal hemorrhoids. Bleeding or ulceration.  Performed: 11/30/2015 9:14 AM  Assessment & Plan Andrew Hector MD; 11/30/2015 9:28 AM) ANAL FISTULA (K60.3) Impression: Chronic posterior anal fistula that most likely explains the intermittent, chronic anal discomfort for the past few years.  I think he would benefit from examination under anesthesia with probable LIFT repair versus fistulotomy. I cannot feel a cord superficial to the sphincter, so probably may be a intersphincteric. We will see. He has good exercise tolerance. Probably reasonable hold his Xerelto 48 hours before surgery and restart the next day to have a short window to avoid recurrent pulmonary embolism given his history of lupus anticoagulant.  He is interested in proceeding with surgery. We will work to coordinate a convenient time. Current Plans You are being scheduled for surgery  - Our schedulers will call you.  You should hear from our office's scheduling department within 5 working days about the location, date, and time of surgery. We try to make accommodations for patient's preferences in scheduling surgery, but sometimes the OR schedule or the surgeon's schedule prevents [place] from making those accommodations.  If you have not heard from our office [phone]) in 5 working days, call the office and ask for your surgeon's nurse.  If you have other questions about your diagnosis, plan, or surgery, call the office and ask for your surgeon's nurse.  The anatomy & physiology of the anorectal region was discussed. We discussed the pathophysiology of anorectal abscess and fistula. Differential diagnosis was discussed. Natural history progression was discussed. I stressed the importance of a bowel regimen to have daily soft bowel movements to minimize progression of disease.  The patient's condition is not adequately controlled. Non-operative treatment has not healed the fistula. Therefore, I recommended examination under anaesthesia to confirm the diagnosis and treat the fistula. I discussed techniques that may be required such as fistulotomy, ligation by LIFT technique, and/or seton placement. Benefits & alternatives discussed. I noted a good likelihood this will help address the problem, but sometimes repeat operations and prolonged healing times may occur. Risks such as bleeding, pain, recurrence, reoperation, incontinence, heart attack, death, and other risks were discussed.  Educational handouts further explaining the pathology, treatment options, and bowel regimen were given. The patient expressed understanding & wishes to proceed. We will work to coordinate surgery for a mutually convenient time.  Pt Education - [facility] Abscess/Fistula (AT): discussed with patient and provided information. Pt Education - [facility] Rectal Prep for Anorectal outpatient/office surgery: discussed with  patient and provided information. Pt Education - [facility] Rectal Surgery HCI (Dalexa Gentz): discussed with patient and provided information. ANOSCOPY, DIAGNOSTIC [id number]) Pt Education - [facility] Hemorrhoids (Goldye Tourangeau): discussed with patient and provided information.  [name], M.D., F.A.C.S. Gastrointestinal and Minimally Invasive Surgery [facility] [address], [place],  [address] [phone] Main / Paging

## 2015-12-03 ENCOUNTER — Other Ambulatory Visit: Payer: Self-pay | Admitting: Internal Medicine

## 2015-12-18 ENCOUNTER — Other Ambulatory Visit: Payer: Self-pay | Admitting: Internal Medicine

## 2015-12-23 ENCOUNTER — Other Ambulatory Visit: Payer: Self-pay | Admitting: Internal Medicine

## 2015-12-25 DIAGNOSIS — Z0000 Encounter for general adult medical examination without abnormal findings: Secondary | ICD-10-CM | POA: Diagnosis not present

## 2015-12-25 DIAGNOSIS — R828 Abnormal findings on cytological and histological examination of urine: Secondary | ICD-10-CM | POA: Diagnosis not present

## 2015-12-25 DIAGNOSIS — Z8546 Personal history of malignant neoplasm of prostate: Secondary | ICD-10-CM | POA: Diagnosis not present

## 2015-12-25 DIAGNOSIS — Z8551 Personal history of malignant neoplasm of bladder: Secondary | ICD-10-CM | POA: Diagnosis not present

## 2016-01-08 ENCOUNTER — Encounter (HOSPITAL_BASED_OUTPATIENT_CLINIC_OR_DEPARTMENT_OTHER): Payer: Self-pay | Admitting: *Deleted

## 2016-01-08 NOTE — Progress Notes (Signed)
NPO AFTER MN.  ARRIVE AT 0600.  NEEDS ISTAT 8 AND EKG.  WILL TAKE AM MEDS W/ EXCEPTION LASIX AND ALTACE DOS W/ SIPS OF WATER.  PT VERBALIZED UNDERSTANDING RECTAL PREP, INSTRUCTIONS GIVEN BY OFFICE.  WILL DO HIBICLENS SHOWER HS BEFORE AND AM DOS. PT STATED WENT BY HIS PCP INSTRUCTION FOR [place] ON Tuesday.

## 2016-01-13 NOTE — Anesthesia Preprocedure Evaluation (Addendum)
Anesthesia Evaluation  Patient identified by MRN, date of birth, ID band Patient awake    Reviewed: Allergy & Precautions, NPO status , Patient's Chart, lab work & pertinent test results, reviewed documented beta blocker date and time   Airway Mallampati: II  TM Distance: >3 FB Neck ROM: Full    Dental  (+) Teeth Intact, Dental Advisory Given, Poor Dentition   Pulmonary sleep apnea and Continuous Positive Airway Pressure Ventilation , Current Smoker, PE (Xarelto therapy)   Pulmonary exam normal breath sounds clear to auscultation       Cardiovascular Exercise Tolerance: Good hypertension, Pt. on home beta blockers and Pt. on medications (-) angina(-) Past MI Normal cardiovascular exam+ Valvular Problems/Murmurs AI  Rhythm:Regular Rate:Normal  Echo 03/23/15: Study Conclusions  - Left ventricle: The cavity size was normal. Systolic function was normal. The estimated ejection fraction was in the range of 55% to 60%. Wall motion was normal; there were no regional wall motion abnormalities. Doppler parameters are consistent with abnormal left ventricular relaxation (grade 1 diastolic dysfunction). There was no evidence of elevated ventricular filling pressure by Doppler parameters. - Aortic valve: There was moderate regurgitation. - Aortic root: The aortic root was normal in size. - Ascending aorta: The ascending aorta was mildly dilated measuring 3.9 cm. - Mitral valve: Structurally normal valve. There was trivial  regurgitation. - Left atrium: The atrium was normal in size. - Right ventricle: Systolic function was normal. - Tricuspid valve: There was trivial regurgitation. - Pulmonary arteries: Systolic pressure was within the normal  range. - Inferior vena cava: The vessel was normal in size. The  respirophasic diameter changes were in the normal range (= 50%), consistent with normal central venous pressure. - Pericardium,  extracardiac: There was no pericardial effusion.  Impressions:  - Moderate aortic regurgitation with mildly dilated ascending aorta measuring 3.9 mm.   Neuro/Psych PSYCHIATRIC DISORDERS Anxiety Depression TIA   GI/Hepatic Neg liver ROS, hiatal hernia, GERD  Medicated and Controlled,  Endo/Other  negative endocrine ROS  Renal/GU negative Renal ROS   Prostate cancer Bladder cancer    Musculoskeletal  (+) Arthritis , Osteoarthritis,    Abdominal   Peds  Hematology Antiphospholipid antibody syndrome   Anesthesia Other Findings Day of surgery medications reviewed with the patient.  Reproductive/Obstetrics                           Anesthesia Physical Anesthesia Plan  ASA: III  Anesthesia Plan: General   Post-op Pain Management:    Induction: Intravenous  Airway Management Planned: Oral ETT  Additional Equipment:   Intra-op Plan:   Post-operative Plan: Extubation in OR  Informed Consent: I have reviewed the patients History and Physical, chart, labs and discussed the procedure including the risks, benefits and alternatives for the proposed anesthesia with the patient or authorized representative who has indicated his/her understanding and acceptance.   Dental advisory given  Plan Discussed with: CRNA  Anesthesia Plan Comments: (Risks/benefits of general anesthesia discussed with patient including risk of damage to teeth, lips, gum, and tongue, nausea/vomiting, allergic reactions to medications, and the possibility of heart attack, stroke and death.  All patient questions answered.  Patient wishes to proceed.)       Anesthesia Quick Evaluation

## 2016-01-14 ENCOUNTER — Ambulatory Visit (HOSPITAL_BASED_OUTPATIENT_CLINIC_OR_DEPARTMENT_OTHER)
Admission: RE | Admit: 2016-01-14 | Discharge: 2016-01-14 | Disposition: A | Discharge status: Home / Self Care | Payer: Medicare Other | Source: Ambulatory Visit | Attending: Surgery | Admitting: Surgery

## 2016-01-14 ENCOUNTER — Other Ambulatory Visit: Payer: Self-pay

## 2016-01-14 ENCOUNTER — Encounter (HOSPITAL_BASED_OUTPATIENT_CLINIC_OR_DEPARTMENT_OTHER): Payer: Self-pay

## 2016-01-14 ENCOUNTER — Encounter (HOSPITAL_BASED_OUTPATIENT_CLINIC_OR_DEPARTMENT_OTHER)
Admission: RE | Disposition: A | Discharge status: Home / Self Care | Payer: Self-pay | Source: Ambulatory Visit | Attending: Surgery

## 2016-01-14 ENCOUNTER — Ambulatory Visit (HOSPITAL_BASED_OUTPATIENT_CLINIC_OR_DEPARTMENT_OTHER): Payer: Medicare Other | Admitting: Anesthesiology

## 2016-01-14 DIAGNOSIS — M1990 Unspecified osteoarthritis, unspecified site: Secondary | ICD-10-CM | POA: Diagnosis not present

## 2016-01-14 DIAGNOSIS — Z7901 Long term (current) use of anticoagulants: Secondary | ICD-10-CM | POA: Insufficient documentation

## 2016-01-14 DIAGNOSIS — F17200 Nicotine dependence, unspecified, uncomplicated: Secondary | ICD-10-CM | POA: Diagnosis not present

## 2016-01-14 DIAGNOSIS — Z8673 Personal history of transient ischemic attack (TIA), and cerebral infarction without residual deficits: Secondary | ICD-10-CM | POA: Diagnosis not present

## 2016-01-14 DIAGNOSIS — Z8546 Personal history of malignant neoplasm of prostate: Secondary | ICD-10-CM | POA: Insufficient documentation

## 2016-01-14 DIAGNOSIS — R011 Cardiac murmur, unspecified: Secondary | ICD-10-CM | POA: Diagnosis not present

## 2016-01-14 DIAGNOSIS — F419 Anxiety disorder, unspecified: Secondary | ICD-10-CM | POA: Insufficient documentation

## 2016-01-14 DIAGNOSIS — I10 Essential (primary) hypertension: Secondary | ICD-10-CM | POA: Diagnosis not present

## 2016-01-14 DIAGNOSIS — I351 Nonrheumatic aortic (valve) insufficiency: Secondary | ICD-10-CM | POA: Insufficient documentation

## 2016-01-14 DIAGNOSIS — K605 Anorectal fistula: Secondary | ICD-10-CM | POA: Diagnosis not present

## 2016-01-14 DIAGNOSIS — K604 Rectal fistula: Secondary | ICD-10-CM | POA: Diagnosis not present

## 2016-01-14 DIAGNOSIS — Z880 Allergy status to penicillin: Secondary | ICD-10-CM | POA: Insufficient documentation

## 2016-01-14 DIAGNOSIS — Z886 Allergy status to analgesic agent status: Secondary | ICD-10-CM | POA: Insufficient documentation

## 2016-01-14 DIAGNOSIS — G4730 Sleep apnea, unspecified: Secondary | ICD-10-CM | POA: Diagnosis not present

## 2016-01-14 DIAGNOSIS — Z79899 Other long term (current) drug therapy: Secondary | ICD-10-CM | POA: Insufficient documentation

## 2016-01-14 DIAGNOSIS — K219 Gastro-esophageal reflux disease without esophagitis: Secondary | ICD-10-CM | POA: Diagnosis not present

## 2016-01-14 DIAGNOSIS — C61 Malignant neoplasm of prostate: Secondary | ICD-10-CM | POA: Diagnosis not present

## 2016-01-14 DIAGNOSIS — F329 Major depressive disorder, single episode, unspecified: Secondary | ICD-10-CM | POA: Insufficient documentation

## 2016-01-14 DIAGNOSIS — Z86711 Personal history of pulmonary embolism: Secondary | ICD-10-CM | POA: Diagnosis not present

## 2016-01-14 DIAGNOSIS — E7800 Pure hypercholesterolemia, unspecified: Secondary | ICD-10-CM | POA: Diagnosis not present

## 2016-01-14 HISTORY — PX: EVALUATION UNDER ANESTHESIA WITH FISTULECTOMY: SHX5623

## 2016-01-14 HISTORY — DX: Cardiac murmur, unspecified: R01.1

## 2016-01-14 HISTORY — DX: Obstructive sleep apnea (adult) (pediatric): G47.33

## 2016-01-14 HISTORY — DX: Personal history of malignant neoplasm of bladder: Z85.51

## 2016-01-14 HISTORY — DX: Ventricular premature depolarization: I49.3

## 2016-01-14 HISTORY — DX: Personal history of pulmonary embolism: Z86.711

## 2016-01-14 HISTORY — DX: Nonrheumatic aortic (valve) insufficiency: I35.1

## 2016-01-14 HISTORY — DX: Presence of spectacles and contact lenses: Z97.3

## 2016-01-14 HISTORY — DX: Personal history of transient ischemic attack (TIA), and cerebral infarction without residual deficits: Z86.73

## 2016-01-14 HISTORY — DX: Personal history of other venous thrombosis and embolism: Z86.718

## 2016-01-14 HISTORY — DX: Dependence on other enabling machines and devices: Z99.89

## 2016-01-14 HISTORY — DX: Personal history of adenomatous and serrated colon polyps: Z86.0101

## 2016-01-14 HISTORY — DX: Unspecified osteoarthritis, unspecified site: M19.90

## 2016-01-14 HISTORY — DX: Personal history of other diseases of the digestive system: Z87.19

## 2016-01-14 HISTORY — DX: Personal history of malignant neoplasm of prostate: Z85.46

## 2016-01-14 HISTORY — DX: Personal history of colonic polyps: Z86.010

## 2016-01-14 LAB — POCT I-STAT, CHEM 8
BUN: 16 mg/dL (ref 6–20)
Calcium, Ion: 1.21 mmol/L (ref 1.13–1.30)
Chloride: 101 mmol/L (ref 101–111)
Creatinine, Ser: 1 mg/dL (ref 0.61–1.24)
Glucose, Bld: 95 mg/dL (ref 65–99)
HCT: 44 % (ref 39.0–52.0)
Hemoglobin: 15 g/dL (ref 13.0–17.0)
Potassium: 4.1 mmol/L (ref 3.5–5.1)
Sodium: 142 mmol/L (ref 135–145)
TCO2: 30 mmol/L (ref 0–100)

## 2016-01-14 SURGERY — EXAM UNDER ANESTHESIA WITH FISTULECTOMY
Anesthesia: General | Site: Rectum

## 2016-01-14 MED ORDER — CHLORHEXIDINE GLUCONATE 4 % EX LIQD
1.0000 "application " | Freq: Once | CUTANEOUS | Status: DC
  Filled 2016-01-14: qty 15

## 2016-01-14 MED ORDER — CLINDAMYCIN PHOSPHATE 600 MG/50ML IV SOLN
INTRAVENOUS | Status: AC
  Filled 2016-01-14: qty 50

## 2016-01-14 MED ORDER — FENTANYL CITRATE (PF) 100 MCG/2ML IJ SOLN
INTRAMUSCULAR | Status: DC | PRN
  Administered 2016-01-14 (×2): 50 ug via INTRAVENOUS

## 2016-01-14 MED ORDER — SUCCINYLCHOLINE CHLORIDE 200 MG/10ML IV SOSY
PREFILLED_SYRINGE | INTRAVENOUS | Status: DC | PRN
  Administered 2016-01-14: 100 mg via INTRAVENOUS

## 2016-01-14 MED ORDER — DEXAMETHASONE SODIUM PHOSPHATE 4 MG/ML IJ SOLN
INTRAMUSCULAR | Status: DC | PRN
  Administered 2016-01-14: 10 mg via INTRAVENOUS

## 2016-01-14 MED ORDER — BUPIVACAINE-EPINEPHRINE (PF) 0.25% -1:200000 IJ SOLN
INTRAMUSCULAR | Status: DC | PRN
  Administered 2016-01-14: 20 mL

## 2016-01-14 MED ORDER — MIDAZOLAM HCL 2 MG/2ML IJ SOLN
INTRAMUSCULAR | Status: AC
  Filled 2016-01-14: qty 2

## 2016-01-14 MED ORDER — OXYCODONE HCL 5 MG PO TABS: 5.0000 mg | ORAL_TABLET | ORAL | Status: DC | PRN

## 2016-01-14 MED ORDER — ONDANSETRON HCL 4 MG/2ML IJ SOLN
4.0000 mg | Freq: Once | INTRAMUSCULAR | Status: DC | PRN
Start: 2016-01-14 — End: 2016-01-14
  Filled 2016-01-14: qty 2

## 2016-01-14 MED ORDER — ONDANSETRON HCL 4 MG/2ML IJ SOLN
INTRAMUSCULAR | Status: AC
  Filled 2016-01-14: qty 2

## 2016-01-14 MED ORDER — SODIUM CHLORIDE 0.9 % IJ SOLN
INTRAMUSCULAR | Status: DC | PRN
  Administered 2016-01-14: 20 mL

## 2016-01-14 MED ORDER — BUPIVACAINE LIPOSOME 1.3 % IJ SUSP
20.0000 mL | INTRAMUSCULAR | Status: DC
  Filled 2016-01-14: qty 20

## 2016-01-14 MED ORDER — DEXAMETHASONE SODIUM PHOSPHATE 10 MG/ML IJ SOLN
INTRAMUSCULAR | Status: AC
  Filled 2016-01-14: qty 1

## 2016-01-14 MED ORDER — KETOROLAC TROMETHAMINE 30 MG/ML IJ SOLN
INTRAMUSCULAR | Status: AC
  Filled 2016-01-14: qty 1

## 2016-01-14 MED ORDER — PROPOFOL 10 MG/ML IV BOLUS
INTRAVENOUS | Status: DC | PRN
  Administered 2016-01-14: 200 mg via INTRAVENOUS

## 2016-01-14 MED ORDER — ARTIFICIAL TEARS OP OINT
TOPICAL_OINTMENT | OPHTHALMIC | Status: AC
  Filled 2016-01-14: qty 3.5

## 2016-01-14 MED ORDER — FENTANYL CITRATE (PF) 100 MCG/2ML IJ SOLN
25.0000 ug | INTRAMUSCULAR | Status: DC | PRN
  Filled 2016-01-14: qty 1

## 2016-01-14 MED ORDER — PROPOFOL 10 MG/ML IV BOLUS
INTRAVENOUS | Status: AC
  Filled 2016-01-14: qty 40

## 2016-01-14 MED ORDER — METOCLOPRAMIDE HCL 5 MG/ML IJ SOLN
INTRAMUSCULAR | Status: DC | PRN
  Administered 2016-01-14: 5 mg via INTRAVENOUS

## 2016-01-14 MED ORDER — LIDOCAINE 5 % EX OINT
TOPICAL_OINTMENT | CUTANEOUS | Status: DC | PRN
  Administered 2016-01-14: 1 via TOPICAL

## 2016-01-14 MED ORDER — CLINDAMYCIN PHOSPHATE 600 MG/50ML IV SOLN
600.0000 mg | INTRAVENOUS | Status: AC
  Administered 2016-01-14: 600 mg via INTRAVENOUS
  Filled 2016-01-14: qty 50

## 2016-01-14 MED ORDER — GENTAMICIN SULFATE 40 MG/ML IJ SOLN
5.0000 mg/kg | Freq: Once | INTRAVENOUS | Status: AC
  Administered 2016-01-14: 410 mg via INTRAVENOUS
  Filled 2016-01-14: qty 10.25

## 2016-01-14 MED ORDER — LACTATED RINGERS IV SOLN
INTRAVENOUS | Status: DC
  Administered 2016-01-14: 07:00:00 via INTRAVENOUS
  Filled 2016-01-14: qty 1000

## 2016-01-14 MED ORDER — FENTANYL CITRATE (PF) 100 MCG/2ML IJ SOLN
INTRAMUSCULAR | Status: AC
  Filled 2016-01-14: qty 2

## 2016-01-14 MED ORDER — MIDAZOLAM HCL 5 MG/5ML IJ SOLN
INTRAMUSCULAR | Status: DC | PRN
  Administered 2016-01-14: 2 mg via INTRAVENOUS

## 2016-01-14 MED ORDER — GENTAMICIN IN SALINE 1-0.9 MG/ML-% IV SOLN
100.0000 mg | INTRAVENOUS | Status: DC
  Filled 2016-01-14: qty 100

## 2016-01-14 MED ORDER — LIDOCAINE HCL (CARDIAC) 20 MG/ML IV SOLN
INTRAVENOUS | Status: AC
  Filled 2016-01-14: qty 5

## 2016-01-14 MED ORDER — KETOROLAC TROMETHAMINE 30 MG/ML IJ SOLN
INTRAMUSCULAR | Status: DC | PRN
  Administered 2016-01-14: 15 mg via INTRAVENOUS

## 2016-01-14 MED ORDER — LIDOCAINE HCL (CARDIAC) 20 MG/ML IV SOLN
INTRAVENOUS | Status: DC | PRN
  Administered 2016-01-14: 100 mg via INTRAVENOUS

## 2016-01-14 MED ORDER — BUPIVACAINE LIPOSOME 1.3 % IJ SUSP
INTRAMUSCULAR | Status: DC | PRN
  Administered 2016-01-14: 20 mL

## 2016-01-14 MED ORDER — METOCLOPRAMIDE HCL 5 MG/ML IJ SOLN
INTRAMUSCULAR | Status: AC
  Filled 2016-01-14: qty 2

## 2016-01-14 MED ORDER — ONDANSETRON HCL 4 MG/2ML IJ SOLN
INTRAMUSCULAR | Status: DC | PRN
Start: 2016-01-14 — End: 2016-01-14
  Administered 2016-01-14: 4 mg via INTRAVENOUS

## 2016-01-14 SURGICAL SUPPLY — 48 items
BLADE HEX COATED 2.75 (ELECTRODE) ×3 IMPLANT
BLADE SURG 15 STRL LF DISP TIS (BLADE) ×1 IMPLANT
BLADE SURG 15 STRL SS (BLADE) ×3
COVER BACK TABLE 60X90IN (DRAPES) ×3 IMPLANT
DRAPE LG THREE QUARTER DISP (DRAPES) IMPLANT
DRAPE UNDERBUTTOCKS STRL (DRAPE) ×3 IMPLANT
DRSG PAD ABDOMINAL 8X10 ST (GAUZE/BANDAGES/DRESSINGS) ×3 IMPLANT
ELECT NDL TIP 2.8 STRL (NEEDLE) IMPLANT
ELECT NEEDLE TIP 2.8 STRL (NEEDLE) IMPLANT
ELECT REM PT RETURN 9FT ADLT (ELECTROSURGICAL) ×3
ELECTRODE REM PT RTRN 9FT ADLT (ELECTROSURGICAL) ×1 IMPLANT
GAUZE SPONGE 4X4 12PLY STRL LF (GAUZE/BANDAGES/DRESSINGS) IMPLANT
GAUZE SPONGE 4X4 16PLY XRAY LF (GAUZE/BANDAGES/DRESSINGS) ×2 IMPLANT
GELFOAM 100 342 01 (MISCELLANEOUS) IMPLANT
GELFOAM SMALL 12 7MM 315 3 (MISCELLANEOUS) IMPLANT
GLOVE BIO SURGEON STRL SZ8 (GLOVE) ×3 IMPLANT
GLOVE ECLIPSE 8.0 STRL XLNG CF (GLOVE) ×2 IMPLANT
GLOVE INDICATOR 8.0 STRL GRN (GLOVE) ×2 IMPLANT
GOWN STRL REUS W/ TWL LRG LVL3 (GOWN DISPOSABLE) ×1 IMPLANT
GOWN STRL REUS W/TWL LRG LVL3 (GOWN DISPOSABLE) ×3
KIT ROOM TURNOVER WOR (KITS) ×3 IMPLANT
LEGGING LITHOTOMY PAIR STRL (DRAPES) IMPLANT
NDL SAFETY ECLIPSE 18X1.5 (NEEDLE) ×1 IMPLANT
NEEDLE HYPO 18GX1.5 SHARP (NEEDLE) ×3
NEEDLE HYPO 22GX1.5 SAFETY (NEEDLE) ×3 IMPLANT
PACK BASIN DAY SURGERY FS (CUSTOM PROCEDURE TRAY) ×3 IMPLANT
PAD ABD 8X10 STRL (GAUZE/BANDAGES/DRESSINGS) ×2 IMPLANT
PAD PREP 24X48 CUFFED NSTRL (MISCELLANEOUS) ×3 IMPLANT
PENCIL BUTTON HOLSTER BLD 10FT (ELECTRODE) ×3 IMPLANT
SPONGE GAUZE 4X4 12PLY STER LF (GAUZE/BANDAGES/DRESSINGS) ×2 IMPLANT
SURGILUBE 2OZ TUBE FLIPTOP (MISCELLANEOUS) ×3 IMPLANT
SUT CHROMIC 2 0 SH (SUTURE) ×2 IMPLANT
SUT CHROMIC 3 0 SH 27 (SUTURE) IMPLANT
SUT MNCRL AB 4-0 PS2 18 (SUTURE) IMPLANT
SUT PROLENE 2 0 SH DA (SUTURE) IMPLANT
SUT VIC AB 2-0 UR6 27 (SUTURE) ×2 IMPLANT
SUT VIC AB 3-0 SH 18 (SUTURE) IMPLANT
SYR BULB IRRIGATION 50ML (SYRINGE) ×2 IMPLANT
SYR CONTROL 10ML LL (SYRINGE) ×5 IMPLANT
TAPE HYPAFIX 4 X10 (GAUZE/BANDAGES/DRESSINGS) ×3 IMPLANT
TOWEL OR 17X24 6PK STRL BLUE (TOWEL DISPOSABLE) ×6 IMPLANT
TRAY DSU PREP LF (CUSTOM PROCEDURE TRAY) ×3 IMPLANT
TUBE CONNECTING 12'X1/4 (SUCTIONS) ×1
TUBE CONNECTING 12X1/4 (SUCTIONS) ×1 IMPLANT
UNDERPAD 30X30 INCONTINENT (UNDERPADS AND DIAPERS) ×3 IMPLANT
WATER STERILE IRR 500ML POUR (IV SOLUTION) ×1 IMPLANT
YANKAUER SUCT BULB TIP 10FT TU (MISCELLANEOUS) ×3 IMPLANT
YANKAUER SUCT BULB TIP NO VENT (SUCTIONS) ×3 IMPLANT

## 2016-01-14 NOTE — Anesthesia Procedure Notes (Addendum)
Procedure Name: Intubation Date/Time: 01/14/2016 7:47 AM Performed by: Mechele Claude Pre-anesthesia Checklist: Patient identified, Emergency Drugs available, Suction available and Patient being monitored Patient Re-evaluated:Patient Re-evaluated prior to inductionOxygen Delivery Method: Circle system utilized Preoxygenation: Pre-oxygenation with 100% oxygen Intubation Type: IV induction and Cricoid Pressure applied Ventilation: Mask ventilation without difficulty Laryngoscope Size: Mac and 4 Grade View: Grade II Tube type: Oral Tube size: 7.5 mm Number of attempts: 1 Airway Equipment and Method: Stylet Placement Confirmation: ETT inserted through vocal cords under direct vision,  positive ETCO2 and breath sounds checked- equal and bilateral Secured at: 23 cm Tube secured with: Tape Dental Injury: Teeth and Oropharynx as per pre-operative assessment

## 2016-01-14 NOTE — H&P (Signed)
[name] 11/30/2015 8:50 AM Location: [facility] Patient #: [id number] DOB: Jun 16, 1946 Married / Language: English / Race: White Male   History of Present Illness   The patient is a 70 year old male who presents with a perirectal abscess. Note for "Perirectal abscess": Patient sent for surgical consultation by Dr Silvano Rusk for perirectal abscess versus fissure.  Pleasant gentleman comes today with persistent rectal drainage. Probable abscess. Prostate cancer survivor status post robotic prostatectomy 2010. No recurrence. Developed pulmonary embolism. Than a recurrence. Found to have lupus anticoagulant. Seem consistent with the fact that his father had PE as well. Now on chronic Xerelto. Uses a treadmill every day 30-14min & er active. Normal ejection fraction on echocardiogram a few years ago.  Question of irritable bowel the past since he's had some occasional constipation and cramping. Switched to high fiber diet and occasional prune juice usually moves his bowels every day. Every other day feels mildly constipated. Normal colonoscopy 2013. Has had intermittent anal pain in the past few years followed by Dr. Carlean Purl with [facility] gastroenterology. Probable fissure improved with diltiazem cream last year. Patient noted persistent anal pain and drainage. No abscess. He should made for office incision and drainage. Comes 3 weeks later after that appointment. He notes he's having less drainage but still has chronic discomfort and has drainage pretty regularly. Patient has some mild hemorrhoids with some occasional bleeding on the [facility] but nothing very severe. Never had any anorectal treatments.  No personal nor family history of GI/colon cancer, inflammatory bowel disease, allergy such as Celiac Sprue, dietary/dairy problems, colitis, ulcers nor gastritis. No recent sick contacts/gastroenteritis. No travel outside the country. No changes in diet. No  dysphagia to solids or liquids. No significant heartburn or reflux. No major hematochezia, hematemesis, coffee ground emesis. No evidence of prior gastric/peptic ulceration.   Other Problems [name], CMA; 11/30/2015 8:50 AM) Anxiety Disorder Arthritis Asthma Back Pain Bladder Problems Cerebrovascular Accident Chest pain Cholelithiasis Depression Diverticulosis Enlarged Prostate Gastroesophageal Reflux Disease Heart murmur Hemorrhoids High blood pressure Hypercholesterolemia Inguinal Hernia Prostate Cancer Pulmonary Embolism / Blood Clot in Legs Sleep Apnea  Past Surgical History [name], CMA; 11/30/2015 8:50 AM) Colon Polyp Removal - Colonoscopy Gallbladder Surgery - Laparoscopic Open Inguinal Hernia Surgery Left. Oral Surgery Prostate Surgery - Removal Tonsillectomy  Diagnostic Studies History [name], CMA; 11/30/2015 8:50 AM) Colonoscopy 5-10 years ago  Allergies [name], CMA; 11/30/2015 8:51 AM) Aspirin Adult Low Dose *ANALGESICS - NonNarcotic* Penicillin G Potassium *PENICILLINS*  Medication History [name], CMA; 11/30/2015 8:53 AM) ALPRAZolam (0.5MG  Tablet, Oral) Active. Crestor (5MG  Tablet, Oral) Active. Furosemide (40MG  Tablet, Oral) Active. Klor-Con 10 (10MEQ Tablet ER, Oral) Active. Metoprolol Tartrate (100MG  Tablet, Oral two times daily) Active. Xarelto (20MG  Tablet, Oral) Active. Ramipril (10MG  Capsule, Oral two times daily) Active. Pantoprazole Sodium (40MG  Tablet DR, Oral) Active. Sertraline HCl (50MG  Tablet, Oral) Active. Viagra (50MG  Tablet, Oral as needed) Active. ROPINIRole HCl (1MG  Tablet, Oral) Active. Voltaren (1% Gel, Transdermal) Active. Fish Oil (1000MG  Capsule, Oral) Active. Multiple Vitamin (Oral) Active. Medications Reconciled  Social History [name], [place]; 11/30/2015 8:50 AM) Alcohol use Moderate alcohol use. Caffeine use Carbonated beverages, Coffee, Tea. No  drug use Tobacco use Current some day smoker.  Family History [name], [place]; 11/30/2015 8:50 AM) Alcohol Abuse Father. Anesthetic complications Family Members In General. Arthritis Brother, Father, Mother, Son. Cancer Father. Cerebrovascular Accident Father, Mother. Colon Polyps Father. Depression Brother, Mother. Heart disease in male family member before age 47 Hypertension  Father, Mother. Ischemic Bowel Disease Father. Thyroid problems Brother, Son.    Review of Systems [name] CMA; 11/30/2015 8:50 AM) General Present- Chills and Night Sweats. Not Present- Appetite Loss, Fatigue, Fever, Weight Gain and Weight Loss. Skin Present- Dryness, Non-Healing Wounds and Rash. Not Present- Change in Wart/Mole, Hives, Jaundice, New Lesions and Ulcer. HEENT Present- Oral Ulcers, Seasonal Allergies, Sinus Pain, Visual Disturbances and Wears glasses/contact lenses. Not Present- Earache, Hearing Loss, Hoarseness, Nose Bleed, Ringing in the Ears, Sore Throat and Yellow Eyes. Respiratory Present- Snoring. Not Present- Bloody sputum, Chronic Cough, Difficulty Breathing and Wheezing. Breast Present- Breast Pain. Not Present- Breast Mass, Nipple Discharge and Skin Changes. Cardiovascular Present- Chest Pain, Difficulty Breathing Lying Down, Palpitations and Shortness of Breath. Not Present- Leg Cramps, Rapid Heart Rate and Swelling of Extremities. Gastrointestinal Present- Abdominal Pain, Bloating, Bloody Stool, Constipation, Excessive gas, Hemorrhoids, Indigestion, Nausea and Rectal Pain. Not Present- Change in Bowel Habits, Chronic diarrhea, Difficulty Swallowing, Gets full quickly at meals and Vomiting. Male Genitourinary Present- Urine Leakage. Not Present- Blood in Urine, Change in Urinary Stream, Frequency, Impotence, Nocturia, Painful Urination and Urgency. Musculoskeletal Present- Back Pain, Joint Pain, Joint Stiffness and Muscle Pain. Not Present- Muscle Weakness and Swelling  of Extremities. Neurological Present- Numbness, Tingling and Weakness. Not Present- Decreased Memory, Fainting, Headaches, Seizures, Tremor and Trouble walking. Psychiatric Present- Anxiety and Depression. Not Present- Bipolar, Change in Sleep Pattern, Fearful and Frequent crying. Endocrine Present- Cold Intolerance. Not Present- Excessive Hunger, Hair Changes, Heat Intolerance, Hot flashes and New Diabetes. Hematology Present- Excessive bleeding. Not Present- Easy Bruising, Gland problems, HIV and Persistent Infections.  Vitals [name] CMA; 11/30/2015 8:54 AM) 11/30/2015 8:53 AM Weight: 212.6 lb Height: 72in Body Surface Area: 2.19 m Body Mass Index: 28.83 kg/m  Temp.: 89F  Pulse: 67 (Regular)  BP: 150/92 (Sitting, Left Arm, Standard)  BP 178/88 mmHg  Pulse 67  Temp(Src) 97.7 F (36.5 C) (Oral)  Resp 16  Ht 5\' 11"  (1.803 m)  Wt 92.761 kg (204 lb 8 oz)  BMI 28.53 kg/m2  SpO2 99%      Physical Exam [name]; 11/30/2015 9:13 AM) General Mental Status-Alert. General Appearance-Not in acute distress, Not Sickly. Orientation-Oriented X3. Hydration-Well hydrated. Voice-Normal.  Integumentary Global Assessment Upon inspection and palpation of skin surfaces of the - Axillae: non-tender, no inflammation or ulceration, no drainage. and Distribution of scalp and body hair is normal. General Characteristics Temperature - normal warmth is noted.  Head and Neck Head-normocephalic, atraumatic with no lesions or palpable masses. Face Global Assessment - atraumatic, no absence of expression. Neck Global Assessment - no abnormal movements, no bruit auscultated on the right, no bruit auscultated on the left, no decreased range of motion, non-tender. Trachea-midline. Thyroid Gland Characteristics - non-tender.  Eye Eyeball - Left-Extraocular movements intact, No Nystagmus. Eyeball - Right-Extraocular movements intact, No  Nystagmus. Cornea - Left-No Hazy. Cornea - Right-No Hazy. Sclera/Conjunctiva - Left-No scleral icterus, No Discharge. Sclera/Conjunctiva - Right-No scleral icterus, No Discharge. Pupil - Left-Direct reaction to light normal. Pupil - Right-Direct reaction to light normal.  ENMT Ears Pinna - Left - no drainage observed, no generalized tenderness observed. Right - no drainage observed, no generalized tenderness observed. Nose and Sinuses External Inspection of the Nose - no destructive lesion observed. Inspection of the nares - Left - quiet respiration. Right - quiet respiration. Mouth and Throat Lips - Upper Lip - no fissures observed, no pallor noted. Lower Lip - no fissures observed, no pallor noted. Nasopharynx -  no discharge present. Oral Cavity/Oropharynx - Tongue - no dryness observed. Oral Mucosa - no cyanosis observed. Hypopharynx - no evidence of airway distress observed.  Chest and Lung Exam Inspection Movements - Normal and Symmetrical. Accessory muscles - No use of accessory muscles in breathing. Palpation Palpation of the chest reveals - Non-tender. Auscultation Breath sounds - Normal and Clear.  Cardiovascular Auscultation Rhythm - Regular. Murmurs & Other Heart Sounds - Auscultation of the heart reveals - No Murmurs and No Systolic Clicks.  Abdomen Inspection Inspection of the abdomen reveals - No Visible peristalsis and No Abnormal pulsations. Umbilicus - No Bleeding, No Urine drainage. Palpation/Percussion Palpation and Percussion of the abdomen reveal - Soft, Non Tender, No Rebound tenderness, No Rigidity (guarding) and No Cutaneous hyperesthesia. Note: Thin abdominal wall. Soft and flat. No major diastases nor umbilical hernia.   Male Genitourinary Sexual Maturity Tanner 5 - Adult hair pattern and Adult penile size and shape. Note: Normal external genitalia. Epididymi, testes, and spermatic cords normal without any masses. No inguinal  hernias.   Rectal Note: Posterior midline slightly right-sided (7 o'clock in lithotomy) sinus 1.5 cm from the anal verge. Cord goes up to the sphincters. Digital exam produces some purulence especially inside but some outside as well. Anoscopy notes purulence posterior midline as well. Seems consistent with classic perirectal fistula. No active abscess or cellulitis. No fissure.    Exam done with assistance of male Medical Assistant in the room. Perianal skin clean with good hygiene. No pruritis ani. No pilonidal disease. No fissure. Normal sphincter tone. Prostate absent consistent with prior prostatectomy. Tolerates digital and anoscopic rectal exam. No rectal masses. External hemorrhoids: No.  Hemorrhoidal piles: Grade 2 right anterior, right posterior greater than left lateral internal hemorrhoids. Bleeding or ulceration.   Peripheral Vascular Upper Extremity Inspection - Left - No Cyanotic nailbeds, Not Ischemic. Right - No Cyanotic nailbeds, Not Ischemic.  Neurologic Neurologic evaluation reveals -normal attention span and ability to concentrate, able to name objects and repeat phrases. Appropriate fund of knowledge , normal sensation and normal coordination. Mental Status Affect - not angry, not paranoid. Cranial Nerves-Normal Bilaterally. Gait-Normal.  Neuropsychiatric Mental status exam performed with findings of-able to articulate well with normal speech/language, rate, volume and coherence, thought content normal with ability to perform basic computations and apply abstract reasoning and no evidence of hallucinations, delusions, obsessions or homicidal/suicidal ideation.  Musculoskeletal Global Assessment Spine, Ribs and Pelvis - no instability, subluxation or laxity. Right Upper Extremity - no instability, subluxation or laxity.  Lymphatic Head & Neck  General Head & Neck Lymphatics: Bilateral - Description - No Localized lymphadenopathy. Axillary  General  Axillary Region: Bilateral - Description - No Localized lymphadenopathy. Femoral & Inguinal  Generalized Femoral & Inguinal Lymphatics: Left - Description - No Localized lymphadenopathy. Right - Description - No Localized lymphadenopathy.   Results [name]; 11/30/2015 9:28 AM) Procedures  Name Value Date Hemorrhoids Procedure Internal exam: Internal Hemorroids ( non-bleeding) Other: Posterior midline slightly right-sided (7 o'clock in lithtomy) sinus 1.5 cm from the anal verge. Cord goes up to the sphincters. Digital exam produces some purulence especially inside but some outside as well. Anoscopy notes purulence posterior midline as well. Seems consistent with classic perirectal fistula. No active abscess or cellulitis. No fissure. Exam done with assistance of male Medical Assistant in the room. Perianal skin clean with good hygiene. No pruritis ani. No pilonidal disease. No fissure.  Normal sphincter tone. Prostate absent consistent with prior prostatectomy. Tolerates digital and anoscopic  rectal exam. No rectal masses. External hemorrhoids: No. Hemorrhoidal piles: Grade 2 right anterior, right posterior greater than left lateral internal hemorrhoids. Bleeding or ulceration.  Performed: 11/30/2015 9:14 AM    Assessment & Plan  ANAL FISTULA (K60.3) Impression: Chronic posterior anal fistula that most likely explains the intermittent, chronic anal discomfort for the past few years.  I think he would benefit from examination under anesthesia with probable LIFT repair versus fistulotomy. I cannot feel a cord superficial to the sphincter, so probably may be a intersphincteric. We will see. He has good exercise tolerance. Probably reasonable hold his Xerelto 48 hours before surgery and restart the next day to have a short window to avoid recurrent pulmonary embolism given his history of lupus anticoagulant.  He is interested in proceeding with surgery. We will  work to coordinate a convenient time.  Current Plans You are being scheduled for surgery - Our schedulers will call you.  You should hear from our office's scheduling department within 5 working days about the location, date, and time of surgery. We try to make accommodations for patient's preferences in scheduling surgery, but sometimes the OR schedule or the surgeon's schedule prevents [place] from making those accommodations.  If you have not heard from our office [phone]) in 5 working days, call the office and ask for your surgeon's nurse.  If you have other questions about your diagnosis, plan, or surgery, call the office and ask for your surgeon's nurse.  The anatomy & physiology of the anorectal region was discussed. We discussed the pathophysiology of anorectal abscess and fistula. Differential diagnosis was discussed. Natural history progression was discussed. I stressed the importance of a bowel regimen to have daily soft bowel movements to minimize progression of disease.  The patient's condition is not adequately controlled. Non-operative treatment has not healed the fistula. Therefore, I recommended examination under anaesthesia to confirm the diagnosis and treat the fistula. I discussed techniques that may be required such as fistulotomy, ligation by LIFT technique, and/or seton placement. Benefits & alternatives discussed. I noted a good likelihood this will help address the problem, but sometimes repeat operations and prolonged healing times may occur. Risks such as bleeding, pain, recurrence, reoperation, incontinence, heart attack, death, and other risks were discussed.  Educational handouts further explaining the pathology, treatment options, and bowel regimen were given. The patient expressed understanding & wishes to proceed. We will work to coordinate surgery for a mutually convenient time.  Pt Education - [facility] Abscess/Fistula (AT): discussed with patient and provided  information. Pt Education - [facility] Rectal Prep for Anorectal outpatient/office surgery: discussed with patient and provided information. Pt Education - [facility] Rectal Surgery HCI (Tajee Savant): discussed with patient and provided information. ANOSCOPY, DIAGNOSTIC [id number]) Pt Education - [facility] Hemorrhoids (Raihana Balderrama): discussed with patient and provided information. Pt Education - [facility] Pelvic Floor Exercises (Kegels) and Dysfunction H  Adin Hector, M.D., F.A.C.S. Gastrointestinal and Minimally Invasive Surgery [facility] [address] [place], [place] [address] [phone] Main / Paging

## 2016-01-14 NOTE — Discharge Instructions (Signed)
ANORECTAL SURGERY:  POST OPERATIVE INSTRUCTIONS  1. Take your usually prescribed home medications unless otherwise directed. 2. DIET: Follow a light bland diet the first 24 hours after arrival home, such as soup, liquids, crackers, etc.  Be sure to include lots of fluids daily.  Avoid fast food or heavy meals as your are more likely to get nauseated.  Eat a low fat the next few days after surgery.   3. PAIN CONTROL: a. Pain is best controlled by a usual combination of three different methods TOGETHER: i. Ice/Heat ii. Over the counter pain medication iii. Prescription pain medication b. Most patients will experience some swelling and discomfort in the anus/rectal area. and incisions.  Ice packs or heat (30-60 minutes up to 6 times a day) will help. Use ice for the first few days to help decrease swelling and bruising, then switch to heat such as warm towels, sitz baths, warm baths, etc to help relax tight/sore spots and speed recovery.  Some people prefer to use ice alone, heat alone, alternating between ice & heat.  Experiment to what works for you.  Swelling and bruising can take several weeks to resolve.   c. It is helpful to take an over-the-counter pain medication regularly for the first few weeks.  Choose one of the following that works best for you: i. Naproxen (Aleve, etc)  Two '220mg'$  tabs twice a day ii. Ibuprofen (Advil, etc) Three '200mg'$  tabs four times a day (every meal & bedtime) iii. Acetaminophen (Tylenol, etc) 500-'650mg'$  four times a day (every meal & bedtime) d. A  prescription for pain medication (such as oxycodone, hydrocodone, etc) should be given to you upon discharge.  Take your pain medication as prescribed.  i. If you are having problems/concerns with the prescription medicine (does not control pain, nausea, vomiting, rash, itching, etc), please call us [phone] to see if we need to switch you to a different pain medicine that will work better for you and/or control your  side effect better. ii. If you need a refill on your pain medication, please contact your pharmacy.  They will contact our office to request authorization. Prescriptions will not be filled after 5 pm or on week-ends.  Use a Sitz Bath 4-8 times a day for relief   [employer] A sitz bath is a warm water bath taken in the sitting position that covers only the hips and buttocks. It may be used for either healing or hygiene purposes. Sitz baths are also used to relieve pain, itching, or muscle spasms. The water may contain medicine. Moist heat will help you heal and relax.  HOME CARE INSTRUCTIONS  Take 3 to 4 sitz baths a day.  Fill the bathtub half full with warm water.  Sit in the water and open the drain a little.  Turn on the warm water to keep the tub half full. Keep the water running constantly.  Soak in the water for 15 to 20 minutes.  After the sitz bath, pat the affected area dry first.   4. KEEP YOUR BOWELS REGULAR a. The goal is one bowel movement a day b. Avoid getting constipated.  Between the surgery and the pain medications, it is common to experience some constipation.  Increasing fluid intake and taking a fiber supplement (such as Metamucil, Citrucel, FiberCon, MiraLax, etc) 1-2 times a day regularly will usually help prevent this problem from occurring.  A mild laxative (prune juice, Milk of Magnesia, MiraLax, etc) should be taken according to package  directions if there are no bowel movements after 48 hours. c. Watch out for diarrhea.  If you have many loose bowel movements, simplify your diet to bland foods & liquids for a few days.  Stop any stool softeners and decrease your fiber supplement.  Switching to mild anti-diarrheal medications (Kayopectate, Pepto Bismol) can help.  If this worsens or does not improve, please call us.  5. Wound Care  a. Remove your bandages the day after surgery.  Unless discharge instructions indicate otherwise, leave your bandage dry and in  place overnight.  Remove the bandage during your first bowel movement.   b. Wear an absorbent pad or soft cotton gauze in your underwear as needed to catch any drainage and help keep the area  c. Keep the area clean and dry.  Bathe / shower every day.  Keep the area clean by showering / bathing over the incision / wound.   It is okay to soak an open wound to help wash it.  Wet wipes or showers / gentle washing after bowel movements is often less traumatic than regular toilet paper. d. Dennis Bast will often notice bleeding with bowel movements.  This should slow down by the end of the first week of surgery e. Expect some drainage.  This should slow down, too, by the end of the first week of surgery.  Wear an absorbent pad or soft cotton gauze in your underwear until the drainage stops.  6. ACTIVITIES as tolerated:   a. You may resume regular (light) daily activities beginning the next day--such as daily self-care, walking, climbing stairs--gradually increasing activities as tolerated.  If you can walk 30 minutes without difficulty, it is safe to try more intense activity such as jogging, treadmill, bicycling, low-impact aerobics, swimming, etc. b. Save the most intensive and strenuous activity for last such as sit-ups, heavy lifting, contact sports, etc  Refrain from any heavy lifting or straining until you are off narcotics for pain control.   c. DO NOT PUSH THROUGH PAIN.  Let pain be your guide: If it hurts to do something, don't do it.  Pain is your body warning you to avoid that activity for another week until the pain goes down. d. You may drive when you are no longer taking prescription pain medication, you can comfortably sit for long periods of time, and you can safely maneuver your car and apply brakes. e. Dennis Bast may have sexual intercourse when it is comfortable.  7. FOLLOW UP in our office a. Please call [facility] at [phone] to set up an appointment to see your surgeon in the office for a follow-up  appointment approximately 2 weeks after your surgery. b. Make sure that you call for this appointment the day you arrive home to insure a convenient appointment time. 10. IF YOU HAVE DISABILITY OR FAMILY LEAVE FORMS, BRING THEM TO THE OFFICE FOR PROCESSING.  DO NOT GIVE THEM TO YOUR DOCTOR.        WHEN TO CALL us [phone]: 1. Poor pain control 2. Reactions / problems with new medications (rash/itching, nausea, etc)  3. Fever over 101.5 F (38.5 C) 4. Inability to urinate 5. Nausea and/or vomiting 6. Worsening swelling or bruising 7. Continued bleeding from incision. 8. Increased pain, redness, or drainage from the incision  The clinic staff is available to answer your questions during regular business hours (8:30am-5pm).  Please dont hesitate to call and ask to speak to one of our nurses for clinical concerns.   A  surgeon from [facility] is always on call at the hospitals   If you have a medical emergency, go to the nearest emergency room or call 911.     [facility], [place], [place], [place], [place]  [address] ? MAIN: [phone] ? TOLL FREE: [phone] ? FAX [phone] [email or web address]  Anal Abscess/Fistula  What is an anal abscess? An anal abscess is an infected cavity filled with pus found near the anus or rectum.  What is an anal fistula? An anal fistula (also called fistula-in-ano) is frequently the result of a previous or current anal abscess, occurring in up to 50% of patients with abscesses. Normal anatomy includes small glands just inside the anus. Occasionally, these glands get clogged and potentially can become infected, leading to an abscess. The fistula is a tunnel that forms under the skin and connects the infected glands to the abscess. A fistula can be present with or without an abscess and may connect just to the skin of the buttocks near the anal opening. Other situations that can result  in a fistula include Crohn's disease, radiation, trauma and malignancy. How does someone get an anal abscess or a fistula? The abscess is most often a result of an acute infection in the internal glands of the anus. Occasionally, bacteria, fecal material or foreign matter can clog the anal gland and create a condition for an abscess cavity to form. Other medical conditions can make these types of infections more likely.  After an abscess drains on its own or has been drained (opened), a tunnel (fistula) may persist, connecting the infected anal gland to the external skin. This typically will involve some type of drainage from the external opening and occurs in up to 50% of abscesses. If the opening on the skin heals when a fistula is present, a recurrent abscess may develop. What are the specific signs or symptoms of an abscess or fistula? A patient with an abscess may have pain, redness or swelling in the area around the anal area. Fatigue, general malaise, as well as accompanying fever or chills are also common. Similar signs and symptoms may be present when patients have a fistula, with the addition of possible irritation of the perianal skin or drainage from an external opening. Is any specific testing necessary to diagnose an abscess or fistula? No. Most anal abscesses or fistula-in-ano are diagnosed and managed on the basis of clinical findings. Occasionally, additional studies such as ultrasound, CT scan, or MRI can assist with the diagnosis of deeper abscesses or the delineation of the fistula tunnel to help guide treatment.  What is the treatment of an anal abscess? The treatment of an abscess is surgical drainage under most circumstances. An incision is made in the skin near the anus to drain the infection. This can be done in a doctor's office with local anesthetic or in an operating room under deeper anesthesia. Hospitalization may be required for patients prone to more significant infections  such as diabetics or patients with decreased immunity. Are antibiotics required to treat this type of infection? Antibiotics alone are a poor alternative to drainage of the infection. For uncomplicated abscesses, the addition of antibiotics to surgical drainage does not improve healing time or reduce the potential for recurrences. There are some conditions in which antibiotics are indicated, such as for patients with compromised or altered immunity, some cardiac valvular conditions or extensive cellulitis. A comprehensive discussion of your past medical history and a physical exam  are important to determine if antibiotics are indicated. What is the treatment of an anal fistula? Surgery is almost always necessary to cure an anal fistula. Although surgery can be fairly straightforward, it may also be complicated, occasionally requiring staged or multiple operations. Consider identifying a specialist in colon and rectal surgery who would be familiar with a number of potential operations to treat the fistula. The surgery may be performed at the same time as drainage of an abscess, although sometimes the fistula doesn't appear until weeks to years after the initial drainage. If the fistula is straightforward, a fistulotomy may be performed. This procedure involves connecting the internal opening within the anal canal to the external opening, creating a groove that will heal from the inside out. This surgery often will require dividing a small portion of the sphincter muscle which has the unlikely potential for affecting the control of bowel movements in a limited number of cases.  Other procedures include placing material within the fistula tract to occlude it or surgically altering the surrounding tissue to accomplish closure of the fistula, with the choice of procedure depending upon the type, length, and location of the fistula. Most of the operations can be performed on an outpatient basis, but may occasionally  require hospitalization. What is the recovery like from surgery? Pain after surgery is controlled with pain pills, fiber and bulk laxatives. Patients should plan for time at home using sitz baths and attempt to avoid the constipation that can be associated with prescription pain medication. Discuss with your surgeon the specific care and time away from work prior to surgery to prepare yourself for post-operative care. Can the abscess or fistula recur? If adequately treated and properly healed, both are unlikely to return. However, despite proper and indicated open or minimally invasive treatment, both abscesses and fistulas can potentially recur. Should similar symptoms arise, suggesting recurrence, it is recommended that you find a colon and rectal surgeon to manage your condition. What is a colon and rectal surgeon? Colon and rectal surgeons are experts in the surgical and non-surgical treatment of diseases of the colon, rectum and anus. They have completed advanced surgical training in the treatment of these diseases as well as full general surgical training. Board-certified colon and rectal surgeons complete residencies in general surgery and colon and rectal surgery, and pass intensive examinations conducted by the American Board of Surgery and the American Board of Colon and Rectal Surgery. They are well-versed in the treatment of both benign and malignant diseases of the colon, rectum and anus and are able to perform routine screening examinations and surgically treat conditions if indicated to do so.  author: Brien Mates, MD, FACS, FASCRS, on behalf of the [place]  2012 American Society of Colon & Rectal Surgeons  Anal Fistula An anal fistula is an abnormal tunnel that develops between the bowel and the skin near the outside of the anus, where stool (feces) comes out. The anus has many tiny glands that make lubricating fluid. Sometimes, these glands become plugged  and infected, and that can cause a fluid-filled pocket (abscess) to form. An anal fistula often develops after this infection or abscess. CAUSES In most cases, an anal fistula is caused by a past or current anal abscess. Other causes include:  A complication of surgery.  Trauma to the rectal area.  Radiation to the area.  Medical conditions or diseases, such as:  Chronic inflammatory bowel disease, such as Crohn disease or ulcerative colitis.  Colon cancer  or rectal cancer.  Diverticular disease, such as diverticulitis.  An STD (sexually transmitted disease), such as gonorrhea, chlamydia, or syphilis.  An infection that is caused by HIV (human immunodeficiency virus).  Foreign body in the rectum. SYMPTOMS Symptoms of this condition include:  Throbbing or constant pain that may be worse while you are sitting.  Swelling or irritation around the anus.  Drainage of pus or blood from an opening near the anus.  Pain with bowel movements.  Fever or chills. DIAGNOSIS Your health care provider will examine the area to find the openings of the anal fistula and the fistula tract. The external opening of the anal fistula may be seen during a physical exam. You may also have tests, including:  An exam of the rectal area with a gloved hand (digital rectal exam).  An exam with a probe or scope to help locate the internal opening of the fistula.  Imaging tests to find the exact location and path of the fistula. These tests may include X-rays, an ultrasound, a CT scan, or MRI. The path is made visible by a dye that is injected into the fistula opening. You may have other tests to find the cause of the anal fistula. TREATMENT The most common treatment for an anal fistula is surgery. The type of surgery that is used will depend on where the fistula is located and how complex the fistula is. Surgical options include:  A fistulotomy. The whole fistula is opened up, and the contents are  drained to promote healing.  Seton placement. A silk string (seton) is placed into the fistula during a fistulotomy. This helps to drain any infection to promote healing.  Advancement flap procedure. Tissue is removed from your rectum or the skin around the anus and is attached to the opening of the fistula.  Bioprosthetic plug. A cone-shaped plug is made from your tissue and is used to block the opening of the fistula. Some anal fistulas do not require surgery. A nonsurgical treatment option involves injecting a fibrin glue to seal the fistula. You also may be prescribed an antibiotic medicine to treat an infection. HOME CARE INSTRUCTIONS Medicines  Take over-the-counter and prescription medicines only as told by your health care provider.  If you were prescribed an antibiotic medicine, take it as told by your health care provider. Do not stop taking the antibiotic even if you start to feel better.  Use a stool softener or a laxative if told to do so by your health care provider. General Instructions  Eat a high-fiber diet as told by your health care provider. This can help to prevent constipation.  Drink enough fluid to keep your urine clear or pale yellow.  Take a warm sitz bath for 15-20 minutes, 3-4 times per day, or as told by your health care provider. Sitz baths can ease your pain and discomfort and help with healing.  Follow good hygiene to keep the anal area as clean and dry as possible. Use wet toilet paper or a moist towelette after each bowel movement.  Keep all follow-up visits as told by your health care provider. This is important. SEEK MEDICAL CARE IF:  You have increased pain that is not controlled with medicines.  You have new redness or swelling around the anal area.  You have new fluid, blood, or pus coming from the anal area.  You have tenderness or warmth around the anal area. SEEK IMMEDIATE MEDICAL CARE IF:  You have a fever.  You have  severe  pain.  You have chills or diarrhea.  You have severe problems urinating or having a bowel movement.   This information is not intended to replace advice given to you by your health care provider. Make sure you discuss any questions you have with your health care provider.   Document Released: 07/14/2008 Document Revised: 04/22/2015 Document Reviewed: 10/27/2014 Elsevier Interactive Patient Education 2016 [place] Instructions  Activity: Get plenty of rest for the remainder of the day. A responsible adult should stay with you for 24 hours following the procedure.  For the next 24 hours, DO NOT: -Drive a car -Paediatric nurse -Drink alcoholic beverages -Take any medication unless instructed by your physician -Make any legal decisions or sign important papers.  Meals: Start with liquid foods such as gelatin or soup. Progress to regular foods as tolerated. Avoid greasy, spicy, heavy foods. If nausea and/or vomiting occur, drink only clear liquids until the nausea and/or vomiting subsides. Call your physician if vomiting continues.  Special Instructions/Symptoms: Your throat may feel dry or sore from the anesthesia or the breathing tube placed in your throat during surgery. If this causes discomfort, gargle with warm salt water. The discomfort should disappear within 24 hours.  If you had a scopolamine patch placed behind your ear for the management of post- operative nausea and/or vomiting:  1. The medication in the patch is effective for 72 hours, after which it should be removed.  Wrap patch in a tissue and discard in the trash. Wash hands thoroughly with soap and water. 2. You may remove the patch earlier than 72 hours if you experience unpleasant side effects which may include dry mouth, dizziness or visual disturbances. 3. Avoid touching the patch. Wash your hands with soap and water after contact with the patch.

## 2016-01-14 NOTE — Transfer of Care (Signed)
   Last Vitals:  Filed Vitals:   01/14/16 0617  BP: 178/88  Pulse: 67  Temp: 36.5 C  Resp: 16    Last Pain: There were no vitals filed for this visit.    Patients Stated Pain Goal: 6 (01/14/16 [id number])  Immediate Anesthesia Transfer of Care Note  Patient: [name]  Procedure(s) Performed: Procedure(s) (LRB): EXAM UNDER ANESTHESIA WITH REPAIR OF PERIRECTAL FISTULA  (LIFT) (N/A)  Patient Location: PACU  Anesthesia Type: General  Level of Consciousness: awake, alert  and oriented  Airway & Oxygen Therapy: Patient Spontanous Breathing and Patient connected to nasal cannula oxygen  Post-op Assessment: Report given to PACU RN and Post -op Vital signs reviewed and stable  Post vital signs: Reviewed and stable  Complications: No apparent anesthesia complications

## 2016-01-14 NOTE — Anesthesia Postprocedure Evaluation (Signed)
Anesthesia Post Note  Patient: Algis C Morioka  Procedure(s) Performed: Procedure(s) (LRB): EXAM UNDER ANESTHESIA WITH REPAIR OF PERIRECTAL FISTULA  (LIFT) (N/A)  Patient location during evaluation: PACU Anesthesia Type: General Level of consciousness: awake and alert Pain management: pain level controlled Vital Signs Assessment: post-procedure vital signs reviewed and stable Respiratory status: spontaneous breathing, nonlabored ventilation, respiratory function stable and patient connected to nasal cannula oxygen Cardiovascular status: blood pressure returned to baseline and stable Postop Assessment: no signs of nausea or vomiting Anesthetic complications: no    Last Vitals:  Filed Vitals:   01/14/16 0930 01/14/16 0945  BP: 151/86 146/83  Pulse: 66 64  Temp:    Resp: 13 12    Last Pain:  Filed Vitals:   01/14/16 0946  PainSc: 0-No pain                 Catalina Gravel

## 2016-01-14 NOTE — Op Note (Signed)
01/14/2016  9:22 AM  PATIENT:  [name]  70 y.o. male  Patient Care Team: [name], MD as PCP - General (Internal Medicine)  PRE-OPERATIVE DIAGNOSIS:  Perirectal fistula   POST-OPERATIVE DIAGNOSIS:  Perirectal fistula (Transphincteric)  PROCEDURE:  Procedure(s): EXAM UNDER ANESTHESIA WITH REPAIR OF PERIRECTAL FISTULA  (LIFT)  SURGEON:  Surgeon(s): Michael Boston, MD  ASSISTANT: RN   ANESTHESIA:   Local field block Anorectal block General  0.25% bupivacaine with epinephrine at the beginning of the case.  Liposomal bupivacaine (Experel) at the end of the case.  EBL:  Total I/O In: 800 [I.V.:800] Out: 10 [Blood:10]  Delay start of Pharmacological VTE agent (>24hrs) due to surgical blood loss or risk of bleeding:  no  DRAINS: none   SPECIMEN:  Source of Specimen:  ANAL FISTULA TRACT  DISPOSITION OF SPECIMEN:  PATHOLOGY  COUNTS:  YES  PLAN OF CARE: Discharge to home after PACU  PATIENT DISPOSITION:  PACU - hemodynamically stable.  INDICATION: Patient with probable perirectal fistula.  I recommended examination and surgical treatment:  The anatomy & physiology of the anorectal region was discussed.  We discussed the pathophysiology of anorectal abscess and fistula.  Differential diagnosis was discussed.  Natural history progression was discussed.   I stressed the importance of a bowel regimen to have daily soft bowel movements to minimize progression of disease.     The patient's condition is not adequately controlled.  Non-operative treatment has not healed the fistula.  Therefore, I recommended examination under anaesthesia to confirm the diagnosis and treat the fistula.  I discussed techniques that may be required such as fistulotomy, ligation by LIFT technique, and/or seton placement.  Benefits & alternatives discussed.  I noted a good likelihood this will help address the problem, but sometimes repeat operations and prolonged healing times may occur.  Risks  such as bleeding, pain, recurrence, reoperation, incontinence, heart attack, death, and other risks were discussed.      Educational handouts further explaining the pathology, treatment options, and bowel regimen were given.  The patient expressed understanding & wishes to proceed.  We will work to coordinate surgery for a mutually convenient time.   OR FINDINGS: Patient had an intersphincteric fistula.    External location Right posterior lateral.    Internal location : posterior midline anal crypt about 4mm from anal verge.  DESCRIPTION:   Informed consent was confirmed. Patient underwent general anesthesia without difficulty. Patient was placed into lithotomy positioning.  The perianal region was prepped and draped in sterile fashion. Surgical timeout confirmed or plan.  The tract did not feel superficial, concerning for a probable intersphincteric fistula.  I did digital rectal examination and then transitioned over to anoscopy to get a sense of the anatomy.  I could see an obvious purulent drainage and an anal crypt inside consistent with the internal opening.  Some inflammation and induration with tiny sinus but opening not large enough to place a probe through.  I began to excise the external opening with a radial biconcave incision around it.  I transitioned to cautery and help free the fistulous tract circumferentially all way down towards the sphincter component.    I excised the perianal scar where an obvious cord was felt.  I continue that goes towards the sphincters.  I opened up the fistulous tract and was able to place a probe into it more proximally.  It exited out the draining sinus at the anal crypt.  Probe did go through the sphincters consistent  with an intersphincteric/transphincteric perirectal fistula.   I went ahead and proceeded with the LIFT technique.  I made a transverse incision at the anal squamocolumnar junction .  Did careful dissection to get down to the sphincter.   I carefully went between the internal and external sphincter using careful blunt dissection parallel to the fibers.  I was able to locate the intersphincteric component of the fistulous tract.  I was able to get around it gently with a right angle clamp.  I carefully skeletonized the intersphincteric component. I placed 2-0 Vicryl stitches through the intersphincteric tract on the rectal side and on the external side in between the external & internal sphincters.  I transected the fistulous tract.  I removed the superficial end of the fistulous tract just outside the sphinceter component & ligated the sphincter side with 2-0 vicryl.  Then excised part of the right posterior hemorrhoidal pile to have a more flat wound.  Closed the rectal wound transversely using interrupted 2-0 chromic suture to good result.  Hemostasis was excellent.  I reexamined the anal canal.   There is was no narrowing.  Hemostasis was excellent.  I repeated anoscopy and examination.  Hemostasis was good.  I excised the external wound to have a large broad flat wound as it was folded upon itself.  I assured hemostasis.    I am about to discuss the patient's status to the patient's wife.  Discussed instructions in detail.  Questions answered.  She expressed understanding and appreciation.   Instructions are written as well.  Adin Hector, M.D., F.A.C.S. Gastrointestinal and Minimally Invasive Surgery [facility] [address], [place], [place] [address] [phone] Main / Paging

## 2016-01-15 ENCOUNTER — Encounter (HOSPITAL_BASED_OUTPATIENT_CLINIC_OR_DEPARTMENT_OTHER): Payer: Self-pay | Admitting: Surgery

## 2016-01-30 ENCOUNTER — Other Ambulatory Visit: Payer: Self-pay | Admitting: Cardiovascular Disease

## 2016-03-07 DIAGNOSIS — B351 Tinea unguium: Secondary | ICD-10-CM | POA: Diagnosis not present

## 2016-03-07 DIAGNOSIS — I10 Essential (primary) hypertension: Secondary | ICD-10-CM | POA: Diagnosis not present

## 2016-03-08 ENCOUNTER — Telehealth: Payer: Self-pay | Admitting: Cardiovascular Disease

## 2016-03-08 NOTE — Telephone Encounter (Signed)
Spoke w/ Mr. Calkin and r/s time of appt to 2:30pm , due to Dr. Gwenlyn Found has a meeting and will not start clinic until 8:15am

## 2016-03-14 NOTE — Telephone Encounter (Signed)
Closed encounter °

## 2016-03-28 ENCOUNTER — Other Ambulatory Visit: Payer: Self-pay | Admitting: Cardiovascular Disease

## 2016-03-28 NOTE — Telephone Encounter (Signed)
Rx(s) sent to pharmacy electronically.  

## 2016-04-01 ENCOUNTER — Emergency Department (HOSPITAL_BASED_OUTPATIENT_CLINIC_OR_DEPARTMENT_OTHER)
Admission: EM | Admit: 2016-04-01 | Discharge: 2016-04-02 | Disposition: A | Discharge status: Home / Self Care | Payer: Medicare Other | Attending: Emergency Medicine | Admitting: Emergency Medicine

## 2016-04-01 ENCOUNTER — Encounter (HOSPITAL_BASED_OUTPATIENT_CLINIC_OR_DEPARTMENT_OTHER): Payer: Self-pay | Admitting: Emergency Medicine

## 2016-04-01 DIAGNOSIS — Z7901 Long term (current) use of anticoagulants: Secondary | ICD-10-CM | POA: Insufficient documentation

## 2016-04-01 DIAGNOSIS — I119 Hypertensive heart disease without heart failure: Secondary | ICD-10-CM | POA: Insufficient documentation

## 2016-04-01 DIAGNOSIS — Z8546 Personal history of malignant neoplasm of prostate: Secondary | ICD-10-CM | POA: Insufficient documentation

## 2016-04-01 DIAGNOSIS — F17210 Nicotine dependence, cigarettes, uncomplicated: Secondary | ICD-10-CM | POA: Insufficient documentation

## 2016-04-01 DIAGNOSIS — Z043 Encounter for examination and observation following other accident: Secondary | ICD-10-CM | POA: Diagnosis not present

## 2016-04-01 DIAGNOSIS — Z8551 Personal history of malignant neoplasm of bladder: Secondary | ICD-10-CM | POA: Diagnosis not present

## 2016-04-01 DIAGNOSIS — X088XXA Exposure to other specified smoke, fire and flames, initial encounter: Secondary | ICD-10-CM | POA: Diagnosis not present
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
## 2016-04-02 ENCOUNTER — Encounter (HOSPITAL_BASED_OUTPATIENT_CLINIC_OR_DEPARTMENT_OTHER): Payer: Self-pay | Admitting: Emergency Medicine

## 2016-04-02 ENCOUNTER — Emergency Department (HOSPITAL_BASED_OUTPATIENT_CLINIC_OR_DEPARTMENT_OTHER): Payer: Medicare Other

## 2016-04-02 DIAGNOSIS — F17210 Nicotine dependence, cigarettes, uncomplicated: Secondary | ICD-10-CM | POA: Diagnosis not present
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
## 2016-04-02 NOTE — ED Notes (Signed)
Pt verbalizes understanding of d/c instructions and denies any further needs at this time. 
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
## 2016-04-02 NOTE — ED Notes (Signed)
Monitored patients SpCo2 on monitor a nd patients level was 2.0 reading with a >3CoMet reading. Patient able to speak in complete sentences and not in distress

## 2016-04-02 NOTE — ED Notes (Signed)
Pt ambulated around department and remained at 98% O2 saturation

## 2016-04-05 ENCOUNTER — Encounter: Payer: Self-pay | Admitting: Cardiovascular Disease

## 2016-04-05 ENCOUNTER — Ambulatory Visit (INDEPENDENT_AMBULATORY_CARE_PROVIDER_SITE_OTHER): Payer: Medicare Other | Admitting: Cardiovascular Disease

## 2016-04-05 ENCOUNTER — Ambulatory Visit: Payer: Medicare Other | Admitting: Cardiovascular Disease

## 2016-04-05 VITALS — BP 146/80 | HR 70 | Ht 71.0 in | Wt 221.0 lb

## 2016-04-05 DIAGNOSIS — I351 Nonrheumatic aortic (valve) insufficiency: Secondary | ICD-10-CM | POA: Diagnosis not present

## 2016-04-05 DIAGNOSIS — I712 Thoracic aortic aneurysm, without rupture, unspecified: Secondary | ICD-10-CM

## 2016-04-05 DIAGNOSIS — I10 Essential (primary) hypertension: Secondary | ICD-10-CM

## 2016-04-05 DIAGNOSIS — Z79899 Other long term (current) drug therapy: Secondary | ICD-10-CM | POA: Diagnosis not present

## 2016-04-05 DIAGNOSIS — E785 Hyperlipidemia, unspecified: Secondary | ICD-10-CM | POA: Diagnosis not present

## 2016-04-05 DIAGNOSIS — Z01818 Encounter for other preprocedural examination: Secondary | ICD-10-CM

## 2016-04-05 NOTE — Progress Notes (Signed)
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 I last saw him 03/17/15.Marland Kitchen He has a history of remote tobacco abuse, having smoked cigars in the past, treated hypertension, dyslipidemia. He has never had a heart attack or stroke. He has had bladder and prostate cancer, and he has had prostate surgery. He is followed by Dr. Algis Ford and has been evaluated by [name] in the past with a Myoview stress test that was normal and an event monitor that showed PVCs for palpitations. He does drink 2-4 cups of coffee a day and has had palpitations since he was a teenager. He saw Dr. Shelia Ford complaining of right-sided chest pain and shortness of breath. He had a CT scan done April 19, 2012 that showed 2 small subsegmental right-sided pulmonary emboli of unclear origin. There is no evidence of DVT. He was placed on Xarelto. Dr. Beryle Ford did a hypercoagulable workup which apparently, according to the patient, was positive for lupus anticoagulant. Since I saw him he regularly remained stable. He still gets occasional atypical chest pain.  Current Outpatient Prescriptions  Medication Sig Dispense Refill  . ALPRAZolam (XANAX) 0.5 MG tablet Take 0.5 mg by mouth 3 (three) times daily as needed.     Marland Kitchen co-enzyme Q-10 30 MG capsule Take 30 mg by mouth every evening.     Marland Kitchen CRESTOR 5 MG tablet TAKE 1 TABLET BY MOUTH EVERY DAY (Patient taking differently: TAKE 1 TABLET BY MOUTH EVERY DAY-- takes in am) 30 tablet 8  . fish oil-omega-3 fatty acids 1000 MG capsule Take 1 g by mouth daily.     . furosemide (LASIX) 40 MG tablet Take 40 mg by mouth. Three times a week    . Hypertonic Nasal Wash  (SINUS RINSE BOTTLE KIT) PACK Place into the nose at bedtime.      Marland Kitchen KLOR-CON 10 10 MEQ tablet Take 10 mEq by mouth 3 (three) times a week. Takes w/ lasix    . metoprolol (LOPRESSOR) 100 MG tablet Take 1 tablet (100 mg total) by mouth 2 (two) times daily. 60 tablet 1  . Multiple Vitamin (MULTIVITAMIN) tablet Take 1 tablet by mouth daily.      Marland Kitchen oxyCODONE (OXY IR/ROXICODONE) 5 MG immediate release tablet Take 1-2 tablets (5-10 mg total) by mouth every 4 (four) hours as needed for moderate pain, severe pain or breakthrough pain. 40 tablet 0  . oxymetazoline (AFRIN) 0.05 % nasal spray Place 2 sprays into the nose 2 (two) times daily.      . pantoprazole (PROTONIX) 40 MG tablet Take 40 mg by mouth every morning.     . ramipril (ALTACE) 10 MG capsule TAKE ONE CAPSULE BY MOUTH TWICE A DAY 60 capsule 0  . Rivaroxaban (XARELTO) 20 MG TABS Take 20 mg by mouth daily.    Marland Kitchen rOPINIRole (REQUIP) 1 MG tablet Take 2 mg by mouth at bedtime.     . sertraline (ZOLOFT) 100 MG tablet Take 100 mg by mouth daily.    . sildenafil (VIAGRA) 50 MG tablet Take 100 mg by mouth daily as needed.      No current facility-administered medications for this visit.     Allergies  Allergen Reactions  .  Aspirin Other (See Comments)    REACTION: gi upset with higher dose  . Penicillins Itching    Social History   Social History  . Marital status: Married    Spouse name: N/A  . Number of children: 2  . Years of education: N/A   Occupational History  . Art gallery manager   . [employer]   Social History Main Topics  . Smoking status: Current Some Day Smoker    Years: 51.00    Types: Cigars, Cigarettes  . Smokeless tobacco: Never Used     Comment: 5 cigars /week--/  smoked cigarettes for 10 years quit 1990's  . Alcohol use 8.4 oz/week    14 Glasses of wine per week     Comment: 2 glasses wine/day  . Drug use: No  . Sexual activity: Not on file   Other Topics Concern  . Not on file   Social  History Narrative   Gets regular exercise.   Daily caffeine- 2 cups daily.   Education: [employer]     Review of Systems: General: negative for chills, fever, night sweats or weight changes.  Cardiovascular: negative for chest pain, dyspnea on exertion, edema, orthopnea, palpitations, paroxysmal nocturnal dyspnea or shortness of breath Dermatological: negative for rash Respiratory: negative for cough or wheezing Urologic: negative for hematuria Abdominal: negative for nausea, vomiting, diarrhea, bright red blood per rectum, melena, or hematemesis Neurologic: negative for visual changes, syncope, or dizziness All other systems reviewed and are otherwise negative except as noted above.    Blood pressure (!) 146/80, pulse 70, height _0  (1.803 m), weight 221 lb (100.2 kg).  General appearance: alert and no distress Neck: no adenopathy, no carotid bruit, no JVD, supple, symmetrical, trachea midline and thyroid not enlarged, symmetric, no tenderness/mass/nodules Lungs: clear to auscultation bilaterally Heart: Soft outflow tract murmur Extremities: extremities normal, atraumatic, no cyanosis or edema  EKG normal sinus rhythm at 70 without ST or T-wave changes. I personally reviewed this EKG  ASSESSMENT AND PLAN:   HYPERLIPIDEMIA History of hyperlipidemia on statin therapy followed by his PCP  Essential hypertension History of hypertension with blood pressure measures 146/80. He is on metoprolol and enalapril. Continue current meds at current dosing  Pulmonary embolus History of pulmonary embolus in the past with thrombophilia workup notable for lupus anticoagulant on Xarelto oral anticoagulation  Thoracic aortic aneurysm (HCC) History of ascending thoracic aortic aneurysm with dimensions of 4.4 cm by CTA one year ago. We will repeat chest CT  Aortic insufficiency History of moderate AI by echo one year ago. His LV size and function was normal at that time. We will repeat a 2-D  echocardiogram      Andrew Harp MD [facility] 04/05/2016 3:09 PM

## 2016-04-05 NOTE — Patient Instructions (Signed)
Medication Instructions:  Your physician recommends that you continue on your current medications as directed. Please refer to the Current Medication list given to you today.   Labwork: Your physician recommends that you return for lab work at your [facility] will need to be fasting. Do not eat or drink past midnight the day you have your labwork. The lab can be found on the FIRST FLOOR of out building in [address]   Testing/Procedures: Your physician has requested that you have an echocardiogram. Echocardiography is a painless test that uses sound waves to create images of your heart. It provides your doctor with information about the size and shape of your heart and how well your heart's chambers and valves are working. This procedure takes approximately one hour. There are no restrictions for this procedure.  Non-Cardiac CT Angiography (CTA), is a special type of CT scan that uses a computer to produce multi-dimensional views of major blood vessels throughout the body. In CT angiography, a contrast material is injected through an IV to help visualize the blood vessels.  Follow-Up: Your physician wants you to follow-up in: [place]. You will receive a reminder letter in the mail two months in advance. If you don't receive a letter, please call our office to schedule the follow-up appointment.   If you need a refill on your cardiac medications before your next appointment, please call your pharmacy.

## 2016-04-05 NOTE — Assessment & Plan Note (Signed)
History of moderate AI by echo one year ago. His LV size and function was normal at that time. We will repeat a 2-D echocardiogram

## 2016-04-05 NOTE — Assessment & Plan Note (Signed)
History of hyperlipidemia on statin therapy followed by his PCP 

## 2016-04-05 NOTE — Assessment & Plan Note (Signed)
History of ascending thoracic aortic aneurysm with dimensions of 4.4 cm by CTA one year ago. We will repeat chest CT

## 2016-04-05 NOTE — Assessment & Plan Note (Signed)
History of pulmonary embolus in the past with thrombophilia workup notable for lupus anticoagulant on Xarelto oral anticoagulation

## 2016-04-05 NOTE — Assessment & Plan Note (Signed)
History of hypertension with blood pressure measures 146/80. He is on metoprolol and enalapril. Continue current meds at current dosing

## 2016-04-05 NOTE — Addendum Note (Signed)
Addended by: Vanessa Ralphs on: 04/05/2016 03:23 PM   Modules accepted: Orders

## 2016-04-12 DIAGNOSIS — I10 Essential (primary) hypertension: Secondary | ICD-10-CM | POA: Diagnosis not present

## 2016-04-12 DIAGNOSIS — I712 Thoracic aortic aneurysm, without rupture: Secondary | ICD-10-CM | POA: Diagnosis not present

## 2016-04-12 DIAGNOSIS — I351 Nonrheumatic aortic (valve) insufficiency: Secondary | ICD-10-CM | POA: Diagnosis not present

## 2016-04-12 DIAGNOSIS — Z79899 Other long term (current) drug therapy: Secondary | ICD-10-CM | POA: Diagnosis not present

## 2016-04-12 DIAGNOSIS — Z01818 Encounter for other preprocedural examination: Secondary | ICD-10-CM | POA: Diagnosis not present

## 2016-04-12 DIAGNOSIS — E785 Hyperlipidemia, unspecified: Secondary | ICD-10-CM | POA: Diagnosis not present

## 2016-04-13 LAB — BASIC METABOLIC PANEL
BUN: 13 mg/dL (ref 7–25)
CALCIUM: 9.6 mg/dL (ref 8.6–10.3)
CO2: 29 mmol/L (ref 20–31)
Chloride: 103 mmol/L (ref 98–110)
Creat: 1.08 mg/dL (ref 0.70–1.25)
GLUCOSE: 89 mg/dL (ref 65–99)
POTASSIUM: 4.6 mmol/L (ref 3.5–5.3)
Sodium: 139 mmol/L (ref 135–146)

## 2016-04-13 LAB — HEPATIC FUNCTION PANEL
ALBUMIN: 4.3 g/dL (ref 3.6–5.1)
ALT: 29 U/L (ref 9–46)
AST: 26 U/L (ref 10–35)
Alkaline Phosphatase: 63 U/L (ref 40–115)
BILIRUBIN TOTAL: 1.1 mg/dL (ref 0.2–1.2)
Bilirubin, Direct: 0.2 mg/dL (ref <=0.2)
Indirect Bilirubin: 0.9 mg/dL (ref 0.2–1.2)
Total Protein: 6.7 g/dL (ref 6.1–8.1)

## 2016-04-13 LAB — LIPID PANEL
CHOL/HDL RATIO: 3.8 ratio (ref <=5.0)
CHOLESTEROL: 159 mg/dL (ref 125–200)
HDL: 42 mg/dL (ref >=40)
LDL Cholesterol: 95 mg/dL (ref <130)
Triglycerides: 110 mg/dL (ref <150)
VLDL: 22 mg/dL (ref <30)

## 2016-04-15 DIAGNOSIS — G4733 Obstructive sleep apnea (adult) (pediatric): Secondary | ICD-10-CM | POA: Diagnosis not present

## 2016-04-16 ENCOUNTER — Other Ambulatory Visit: Payer: Self-pay | Admitting: Cardiovascular Disease

## 2016-04-19 NOTE — Telephone Encounter (Signed)
Rx(s) sent to pharmacy electronically.  

## 2016-04-20 ENCOUNTER — Encounter: Payer: Self-pay | Admitting: Cardiovascular Disease

## 2016-04-20 ENCOUNTER — Ambulatory Visit (INDEPENDENT_AMBULATORY_CARE_PROVIDER_SITE_OTHER)
Admission: RE | Admit: 2016-04-20 | Discharge: 2016-04-20 | Disposition: A | Discharge status: Home / Self Care | Payer: Medicare Other | Source: Ambulatory Visit | Attending: Cardiovascular Disease | Admitting: Cardiovascular Disease

## 2016-04-20 ENCOUNTER — Other Ambulatory Visit (HOSPITAL_COMMUNITY): Payer: Medicare Other

## 2016-04-20 DIAGNOSIS — I351 Nonrheumatic aortic (valve) insufficiency: Secondary | ICD-10-CM | POA: Diagnosis not present

## 2016-04-20 DIAGNOSIS — Z79899 Other long term (current) drug therapy: Secondary | ICD-10-CM | POA: Diagnosis not present

## 2016-04-20 DIAGNOSIS — I712 Thoracic aortic aneurysm, without rupture, unspecified: Secondary | ICD-10-CM

## 2016-04-20 DIAGNOSIS — E785 Hyperlipidemia, unspecified: Secondary | ICD-10-CM | POA: Diagnosis not present

## 2016-04-20 MED ORDER — IOPAMIDOL (ISOVUE-370) INJECTION 76%
100.0000 mL | Freq: Once | INTRAVENOUS | Status: AC | PRN
Start: 2016-04-20 — End: 2016-04-20
  Administered 2016-04-20: 100 mL via INTRAVENOUS

## 2016-04-25 DIAGNOSIS — Z23 Encounter for immunization: Secondary | ICD-10-CM | POA: Diagnosis not present

## 2016-04-25 DIAGNOSIS — I10 Essential (primary) hypertension: Secondary | ICD-10-CM | POA: Diagnosis not present

## 2016-04-25 DIAGNOSIS — G4733 Obstructive sleep apnea (adult) (pediatric): Secondary | ICD-10-CM | POA: Diagnosis not present

## 2016-04-25 DIAGNOSIS — Z91041 Radiographic dye allergy status: Secondary | ICD-10-CM | POA: Diagnosis not present

## 2016-04-25 DIAGNOSIS — I712 Thoracic aortic aneurysm, without rupture: Secondary | ICD-10-CM | POA: Diagnosis not present

## 2016-04-27 ENCOUNTER — Other Ambulatory Visit: Payer: Self-pay | Admitting: Cardiovascular Disease

## 2016-04-29 ENCOUNTER — Ambulatory Visit (HOSPITAL_COMMUNITY): Payer: Medicare Other | Attending: Cardiology

## 2016-04-29 ENCOUNTER — Other Ambulatory Visit: Payer: Self-pay

## 2016-04-29 DIAGNOSIS — Z720 Tobacco use: Secondary | ICD-10-CM | POA: Diagnosis not present

## 2016-04-29 DIAGNOSIS — I351 Nonrheumatic aortic (valve) insufficiency: Secondary | ICD-10-CM

## 2016-04-29 DIAGNOSIS — E785 Hyperlipidemia, unspecified: Secondary | ICD-10-CM | POA: Insufficient documentation

## 2016-04-29 DIAGNOSIS — I10 Essential (primary) hypertension: Secondary | ICD-10-CM | POA: Insufficient documentation

## 2016-04-29 DIAGNOSIS — I071 Rheumatic tricuspid insufficiency: Secondary | ICD-10-CM | POA: Diagnosis not present

## 2016-04-29 DIAGNOSIS — Z79899 Other long term (current) drug therapy: Secondary | ICD-10-CM | POA: Diagnosis not present

## 2016-05-08 DIAGNOSIS — G2581 Restless legs syndrome: Secondary | ICD-10-CM | POA: Diagnosis not present

## 2016-05-08 DIAGNOSIS — I10 Essential (primary) hypertension: Secondary | ICD-10-CM | POA: Diagnosis not present

## 2016-05-08 DIAGNOSIS — G4733 Obstructive sleep apnea (adult) (pediatric): Secondary | ICD-10-CM | POA: Diagnosis not present

## 2016-05-11 ENCOUNTER — Institutional Professional Consult (permissible substitution) (INDEPENDENT_AMBULATORY_CARE_PROVIDER_SITE_OTHER): Payer: Medicare Other | Admitting: Surgery

## 2016-05-11 ENCOUNTER — Encounter: Payer: Self-pay | Admitting: Surgery

## 2016-05-11 VITALS — BP 183/97 | HR 61 | Resp 16 | Ht 71.0 in | Wt 220.0 lb

## 2016-05-11 DIAGNOSIS — I712 Thoracic aortic aneurysm, without rupture, unspecified: Secondary | ICD-10-CM

## 2016-05-17 ENCOUNTER — Encounter: Payer: Self-pay | Admitting: Surgery

## 2016-05-17 NOTE — Progress Notes (Signed)
Cardiothoracic Surgery Consultation  PCP is [name], MD Referring Provider is [name], MD  Chief Complaint  Patient presents with  . TAA    eval..CTA CHEST 04/20/16...ECHO 04/29/16    HPI:  The patient is a 70 year old gentleman with hypertension, dyslipidemia, remote smoking, PE in 2013 with reported antiphospholipid antibody syndrome, and a history of prostate and bladder carcinoma. He has a CT of the chest, abdomen and pelvis in 07/2015 due to some abdominal pain and this showed no sign of recurrent cancer but did show a 4.4 cm fusiform ascending aortic aneurysm. He had a CTA of the chest in 2013 when he presented with shortness of breath with prior PE but there was no mention of his aorta. By my measurement the ascending aorta was 45 mm at that time. He had a follow up scan on 04/20/2016 which showed the ascending aorta to be slightly larger at 4.7 x 4.6 cm compared to 4.4 x 4.3 cm in 07/2015. He had an echo done on 04/29/2016 showing a trileaflet aortic valve with mild AI. The aortic root was 41 mm and the ascending aorta 44 mm.   Past Medical History:  Diagnosis Date  . Antiphospholipid antibody syndrome ([place]) 05/22/2012   Found subsequent to recurrent PE 9/13  . Arthritis   . Chronic anal fissure    w/ recurrence's  . Chronic anxiety   . Depression   . ED (erectile dysfunction)   . Fatty liver   . GERD (gastroesophageal reflux disease)   . Heart murmur   . Hiatal hernia   . History of adenomatous polyp of colon    2005  . History of bladder cancer urologist-  dr grapey   papillary TCC  s/p  turbt  . History of DVT (deep vein thrombosis)    post prostate surgery 2010  right LLE  . History of esophageal stricture    w/ dilation  . History of gastric polyp    benign 2010  . History of prostate cancer urologist- dr grapey/  oncology-  dr Alen Blew   Stage  T1c , Gleason 3+3;   s/p  prostatectomy 03-08-210//  per pt currently PSA nondetectable   . History of  pulmonary embolus (PE)    post prostate surgery 2010  and 2013 x2 right side per CT scan (found to have lupus anticoaglated )  . History of TIA (transient ischemic attack)    07/ 2012 noted on scans remote probable tia's in  age 61's  . History of TMJ syndrome   . HTN (hypertension)   . Hyperlipidemia   . Lupus anticoagulant positive    found 2013 when pt had PE  . Moderate aortic regurgitation    with no  stenosis   . OSA on CPAP    per study severe osa 12-14-2010  . RLS (restless legs syndrome)   . RLS (restless legs syndrome)   . Symptomatic PVCs cardiologist-  dr berry   intermittant  . Wears glasses     Past Surgical History:  Procedure Laterality Date  . APPENDECTOMY  age 56   and Left Inguinal Hernia Repair  . CARDIOVASCULAR STRESS TEST  06-30-2011   dr berry   normal nuclear study/  normal LV function and wall motion, ef 56%  . COLONOSCOPY  last one 10-19-2011  . CYSTO/ BLADDER BX/  RETROGRADE PYELOGRAM/  TRANSRECTAL PROSTATE BX'S  07-30-2008  . ESOPHAGOGASTRODUODENOSCOPY  last one 06-27-2011  . EVALUATION UNDER ANESTHESIA WITH FISTULECTOMY N/A  01/14/2016   Procedure: EXAM UNDER ANESTHESIA WITH REPAIR OF PERIRECTAL FISTULA  (LIFT);  Surgeon: Michael Boston, MD;  Location: [facility];  Service: General;  Laterality: N/A;  . LAPAROSCOPIC CHOLECYSTECTOMY  05-25-2007   and Excision cyst back of neck  . ROBOT ASSISTED LAPAROSCOPIC RADICAL PROSTATECTOMY  10-20-2008  . STRABISMUS SURGERY Bilateral age 47  . TONSILLECTOMY  age 54  . TRANSTHORACIC ECHOCARDIOGRAM  03-23-2015   dr berry   grade 1 diastolic dysfunction, ef 92-92%/  mild AV thickened leaflets with no stenosis,  moderate AR/  mild ascending aorta ilatation3.9cm/  trivial MR and TR    Family History  Problem Relation Age of Onset  . Hypertension Mother   . Hypertension Father   . Stroke Father   . Colon polyps Father   . Colon cancer Father     questionable  . Diabetes Maternal Grandmother   .  Heart disease Maternal Grandmother   . Colon polyps Brother   . Crohn's disease Brother   . Cirrhosis      [employer]    Social History Social History  Substance Use Topics  . Smoking status: Current Some Day Smoker    Years: 51.00    Types: Cigars, Cigarettes  . Smokeless tobacco: Never Used     Comment: 5 cigars /week--/  smoked cigarettes for 10 years quit 1990's  . Alcohol use 8.4 oz/week    14 Glasses of wine per week     Comment: 2 glasses wine/day    Current Outpatient Prescriptions  Medication Sig Dispense Refill  . ALPRAZolam (XANAX) 0.5 MG tablet Take 0.5 mg by mouth 3 (three) times daily as needed.     Marland Kitchen co-enzyme Q-10 30 MG capsule Take 30 mg by mouth every evening.     Marland Kitchen CRESTOR 5 MG tablet TAKE 1 TABLET BY MOUTH EVERY DAY (Patient taking differently: TAKE 1 TABLET BY MOUTH EVERY DAY-- takes in am) 30 tablet 8  . fish oil-omega-3 fatty acids 1000 MG capsule Take 1 g by mouth daily.     . furosemide (LASIX) 40 MG tablet Take 40 mg by mouth. Three times a week    . Hypertonic Nasal Wash (SINUS RINSE BOTTLE KIT) PACK Place into the nose at bedtime.      Marland Kitchen KLOR-CON 10 10 MEQ tablet Take 10 mEq by mouth 3 (three) times a week. Takes w/ lasix    . metoprolol (LOPRESSOR) 100 MG tablet Take 1 tablet (100 mg total) by mouth 2 (two) times daily. 60 tablet 1  . Multiple Vitamin (MULTIVITAMIN) tablet Take 1 tablet by mouth daily.      Marland Kitchen oxyCODONE (OXY IR/ROXICODONE) 5 MG immediate release tablet Take 1-2 tablets (5-10 mg total) by mouth every 4 (four) hours as needed for moderate pain, severe pain or breakthrough pain. 40 tablet 0  . oxymetazoline (AFRIN) 0.05 % nasal spray Place 2 sprays into the nose 2 (two) times daily.      . pantoprazole (PROTONIX) 40 MG tablet Take 40 mg by mouth every morning.     . ramipril (ALTACE) 10 MG capsule TAKE ONE CAPSULE BY MOUTH TWICE A DAY 60 capsule 11  . Rivaroxaban (XARELTO) 20 MG TABS Take 20 mg by mouth daily.    Marland Kitchen rOPINIRole (REQUIP) 1  MG tablet Take 2 mg by mouth at bedtime.     . sertraline (ZOLOFT) 100 MG tablet Take 100 mg by mouth daily.    . sildenafil (VIAGRA) 50 MG tablet  Take 100 mg by mouth daily as needed.      No current facility-administered medications for this visit.     Allergies  Allergen Reactions  . Aspirin Other (See Comments)    REACTION: gi upset with higher dose  . Penicillins Itching    Review of Systems  Constitutional: Positive for unexpected weight change. Negative for activity change, appetite change and fatigue.  HENT: Positive for hearing loss.   Eyes:       Floaters  Respiratory: Positive for apnea and shortness of breath.        CPAP at night  Cardiovascular: Positive for chest pain and palpitations. Negative for leg swelling.  Gastrointestinal: Positive for constipation.       Hiatal hernia and reflux  Endocrine: Negative.   Genitourinary: Negative.   Musculoskeletal: Positive for arthralgias, joint swelling and myalgias.  Skin: Positive for rash.  Neurological:       Hx of mini-stroke  Chronic pain  Hematological:       Clotting disorder  Psychiatric/Behavioral: The patient is nervous/anxious.        Depression    BP (!) 183/97   Pulse 61   Resp 16   Ht _0  (1.803 m)   Wt 220 lb (99.8 kg)   SpO2 98% Comment: ON RA  BMI 30.68 kg/m  Physical Exam  Constitutional: He is oriented to person, place, and time. He appears well-developed and well-nourished. No distress.  HENT:  Head: Normocephalic and atraumatic.  Mouth/Throat: Oropharynx is clear and moist.  Eyes: EOM are normal. Pupils are equal, round, and reactive to light.  Neck: Normal range of motion. Neck supple. No JVD present. No thyromegaly present.  Cardiovascular: Normal rate, regular rhythm, normal heart sounds and intact distal pulses.   No murmur heard. Pulmonary/Chest: Effort normal and breath sounds normal. No respiratory distress.  Abdominal: Soft. Bowel sounds are normal. He exhibits no  distension and no mass. There is no tenderness.  Musculoskeletal: Normal range of motion. He exhibits no edema.  Lymphadenopathy:    He has no cervical adenopathy.  Neurological: He is alert and oriented to person, place, and time. He has normal strength. No cranial nerve deficit or sensory deficit.  Skin: Skin is warm and dry.  Psychiatric: He has a normal mood and affect.     Diagnostic Tests:  CLINICAL DATA:  Dilatation of the ascending thoracic aorta documented on prior CT  EXAM: CT ANGIOGRAPHY CHEST WITH CONTRAST  TECHNIQUE: Multidetector CT imaging of the chest was performed using the standard protocol during bolus administration of intravenous contrast. Multiplanar CT image reconstructions and MIPs were obtained to evaluate the vascular anatomy.  CONTRAST:  100 mL Isovue 370 nonionic  COMPARISON:  Chest CT July 21, 2015; chest radiograph April 02, 2016  FINDINGS: Cardiovascular: The at ascending thoracic aorta at diameter at the level of the main pulmonary outflow tract has increased slightly compared to the prior study with a current transverse measurement of 4.7 x 4.6 cm compared to 4.4 x 4.3 cm on the prior study. There is no thoracic aortic dissection. The visualized great vessels appear unremarkable. There are foci of coronary artery calcification. The pericardium is not thickened. There is no evident pulmonary embolus.  Mediastinum/Nodes: Thyroid appears unremarkable. There is no thoracic adenopathy.  Lungs/Pleura: There is no parenchymal lung edema or consolidation. There is a stable 3 mm nodular opacity in the posterior left base, seen on axial slice 90 series 5. There is a stable 2 mm  nodular opacity in the posterior right base on axial slice 87 series 5. No new parenchymal lung nodular opacities are evident.  Upper Abdomen: In the visualized upper abdomen, gallbladder is absent. There is hepatic steatosis. There is a 7 mm area of  arterial enhancement in the anterior aspect of the right upper lobe. 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 Visualized upper abdominal structures otherwise appear unremarkable.  Musculoskeletal: There are foci of degenerative change in the thoracic spine. There are no blastic or lytic bone lesions. No chest wall lesions are evident.  Review of the MIP images confirms the above findings.  IMPRESSION: Ascending thoracic aortic diameter has increased compared to prior study with current measured transverse diameter in the ascending thoracic aorta at the level of the main pulmonary outflow tract of 4.7 x 4.6 cm. Ascending thoracic aortic aneurysm. Recommend semi-annual imaging followup by CTA or MRA and referral to cardiothoracic surgery if not already obtained. This recommendation follows 2010 ACCF/AHA/AATS/ACR/ASA/SCA/SCAI/SIR/STS/SVM Guidelines for the Diagnosis and Management of Patients With Thoracic Aortic Disease. Circulation. 2010; 121: E174-J159  No thoracic aortic dissection evident. There are multiple foci of coronary artery calcification.  Stable 2-3 mm nodular opacities in the lung bases. No new opacities. No edema or consolidation. No evident adenopathy.  Hepatic steatosis. Gallbladder absent. 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 Particular attention to this area on subsequent evaluations advised.   Electronically Signed   By: Bretta Bang III M.D.   On: 04/20/2016 09:35      *[facility]*                        [address]                        [place], [place] [address]                            [phone]  ------------------------------------------------------------------- Transthoracic Echocardiography  Patient:    Haze, Antillon MR #:       [id number] Study Date: 04/29/2016 Gender:     M Age:         61 Height:     180.3 cm Weight:     100.2 kg BSA:        2.27 m^2 Pt. Status: Room:   ORDERING     Nanetta Batty, MD  REFERRING    Nanetta Batty, MD  ATTENDING    Olga Millers  SONOGRAPHER  Aida Raider, RDCS  PERFORMING   [facility], Outpatient  cc:  ------------------------------------------------------------------- LV EF: 55% -   60%  ------------------------------------------------------------------- Indications:      I35.1 Aortic Insufficiency.  ------------------------------------------------------------------- History:   PMH:  Acquired from the patient and from the patient&'s chart.  Risk factors:  Current tobacco use. Hypertension. Dyslipidemia.  ------------------------------------------------------------------- Study Conclusions  - Left ventricle: The cavity size was normal. Wall thickness was   normal. Systolic function was normal. The estimated ejection   fraction was in the range of 55% to 60%. Wall motion was normal;   there were no regional wall motion abnormalities. Doppler   parameters are consistent with abnormal left ventricular   relaxation (grade 1 diastolic dysfunction). - Aortic valve: There was mild regurgitation. - Aortic root: The aortic root was mildly dilated. - Ascending aorta: The ascending aorta was mildly  dilated.  Impressions:  - Normal LV systolic function; grade 1 diastolic dysfunction;   sclerotic aortc valve with mild AI; mildly dilated ascending   aorta (4.4 cm); suggest CTA or MRA to further assess.  ------------------------------------------------------------------- Study data:   Study status:  Routine.  Procedure:  The patient reported no pain pre or post test. Transthoracic echocardiography for left ventricular function evaluation, for right ventricular function evaluation, and for assessment of valvular function. Image quality was adequate.  Study completion:  There were no complications.          Transthoracic  echocardiography.  M-mode, complete 2D, spectral Doppler, and color Doppler.  Birthdate: Patient birthdate: 13-Mar-1946.  Age:  Patient is 69 yr old.  Sex: Gender: male.    BMI: 30.8 kg/m^2.  Blood pressure:     146/80 Patient status:  Outpatient.  Study date:  Study date: 04/29/2016. Study time: 02:23 PM.  Location:  [facility]  -------------------------------------------------------------------  ------------------------------------------------------------------- Left ventricle:  The cavity size was normal. Wall thickness was normal. Systolic function was normal. The estimated ejection fraction was in the range of 55% to 60%. Wall motion was normal; there were no regional wall motion abnormalities. Doppler parameters are consistent with abnormal left ventricular relaxation (grade 1 diastolic dysfunction).  ------------------------------------------------------------------- Aortic valve:   Trileaflet; mildly thickened leaflets. Mobility was not restricted.  Doppler:  Transvalvular velocity was within the normal range. There was no stenosis. There was mild regurgitation.   ------------------------------------------------------------------- Aorta:  Aortic root: The aortic root was mildly dilated. Ascending aorta: The ascending aorta was mildly dilated.  ------------------------------------------------------------------- Mitral valve:   Structurally normal valve.   Mobility was not restricted.  Doppler:  Transvalvular velocity was within the normal range. There was no evidence for stenosis. There was no regurgitation.  ------------------------------------------------------------------- Left atrium:  The atrium was normal in size.  ------------------------------------------------------------------- Right ventricle:  The cavity size was normal. Systolic function was normal.  ------------------------------------------------------------------- Pulmonic valve:    Doppler:   Transvalvular velocity was within the normal range. There was no evidence for stenosis.  ------------------------------------------------------------------- Tricuspid valve:   Structurally normal valve.    Doppler: Transvalvular velocity was within the normal range. There was trivial regurgitation.  ------------------------------------------------------------------- Right atrium:  The atrium was normal in size.  ------------------------------------------------------------------- Pericardium:  There was no pericardial effusion.  ------------------------------------------------------------------- Systemic veins: Inferior vena cava: The vessel was normal in size.  ------------------------------------------------------------------- Measurements   Left ventricle                           Value        Reference  LV ID, ED, PLAX chordal                  43.4  mm     43 - 52  LV ID, ES, PLAX chordal                  31.1  mm     23 - 38  LV fx shortening, PLAX chordal   (L)     28    %      >=29  LV PW thickness, ED                      10.7  mm     ---------  IVS/LV PW ratio, ED  1.01         <=1.3  Stroke volume, 2D                        89    ml     ---------  Stroke volume/bsa, 2D                    39    ml/m^2 ---------  LV e&', lateral                           10.5  cm/s   ---------  LV E/e&', lateral                         4.28         ---------  LV e&', medial                            6.27  cm/s   ---------  LV E/e&', medial                          7.16         ---------  LV e&', average                           8.39  cm/s   ---------  LV E/e&', average                         5.35         ---------    Ventricular septum                       Value        Reference  IVS thickness, ED                        10.85 mm     ---------    LVOT                                     Value        Reference  LVOT ID, S                               23     mm     ---------  LVOT area                                4.15  cm^2   ---------  LVOT ID                                  23    mm     ---------  LVOT peak velocity, S                    107   cm/s   ---------  LVOT mean velocity, S  74    cm/s   ---------  LVOT VTI, S                              21.4  cm     ---------  LVOT peak gradient, S                    5     mm Hg  ---------  Stroke volume (SV), LVOT DP              88.9  ml     ---------  Stroke index (SV/bsa), LVOT DP           39.2  ml/m^2 ---------    Aortic valve                             Value        Reference  Aortic regurg pressure half-time         500   ms     ---------    Aorta                                    Value        Reference  Aortic root ID, ED                       41    mm     ---------  Ascending aorta ID, A-P, S               44    mm     ---------    Left atrium                              Value        Reference  LA ID, A-P, ES                           41    mm     ---------  LA ID/bsa, A-P                           1.81  cm/m^2 <=2.2  LA volume, S                             50    ml     ---------  LA volume/bsa, S                         22.1  ml/m^2 ---------  LA volume, ES, 1-p A4C                   45    ml     ---------  LA volume/bsa, ES, 1-p A4C               19.9  ml/m^2 ---------  LA volume, ES, 1-p A2C                   50    ml     ---------  LA volume/bsa, ES, 1-p A2C  22.1  ml/m^2 ---------    Mitral valve                             Value        Reference  Mitral E-wave peak velocity              44.9  cm/s   ---------  Mitral A-wave peak velocity              82.9  cm/s   ---------  Mitral deceleration time         (H)     324   ms     150 - 230  Mitral E/A ratio, peak                   0.5          ---------    Right ventricle                          Value        Reference  RV s&', lateral, S                        15    cm/s    ---------  Legend: (L)  and  (H)  mark values outside specified reference range.  ------------------------------------------------------------------- Prepared and Electronically Authenticated by  Kirk Ruths 2017-09-15T16:02:10    Impression:  I have personally reviewed and interpreted his recent echo and CTA studies as well as his prior CTA studies from 07/2015, 04/2012, and 03/2011. He has a 4.7 x 4.6 cm fusiform ascending aortic aneurysm that is slightly larger than it was in 07/2015 when it was 4.4 cm.This aneurysm was 4.2 cm in 2012. His echo shows a trileaflet aortic valve with mild AI. His BP is high today but he says that it is usually lower. He is on Lopressor 100 bid and Altace 10 bid. I don't think there is any indication for surgical treatment at this time but it will require continued close follow up with a CTA and echo in one year. I reveiwed the CT films with him and answered his many questions. I stressed the importance of good BP control.   Plan:  I will see him back in one year with a CTA of the chest to follow up on his aneurysm. He will continue to follow up with [name] and Dr. Gwenlyn Found.  I spent 60 minutes performing this consultation and > 50% of this time was spent face to face counseling and coordinating the care of this patient's ascending aortic aneurysm.  Gaye Pollack, MD [facility] [phone]

## 2016-05-24 ENCOUNTER — Other Ambulatory Visit: Payer: Self-pay | Admitting: Cardiovascular Disease

## 2016-05-24 DIAGNOSIS — Z1283 Encounter for screening for malignant neoplasm of skin: Secondary | ICD-10-CM | POA: Diagnosis not present

## 2016-05-24 DIAGNOSIS — B078 Other viral warts: Secondary | ICD-10-CM | POA: Diagnosis not present

## 2016-05-24 DIAGNOSIS — D225 Melanocytic nevi of trunk: Secondary | ICD-10-CM | POA: Diagnosis not present

## 2016-05-24 DIAGNOSIS — L304 Erythema intertrigo: Secondary | ICD-10-CM | POA: Diagnosis not present

## 2016-05-24 DIAGNOSIS — D485 Neoplasm of uncertain behavior of skin: Secondary | ICD-10-CM | POA: Diagnosis not present

## 2016-06-13 ENCOUNTER — Other Ambulatory Visit: Payer: Self-pay | Admitting: *Deleted

## 2016-06-13 MED ORDER — RAMIPRIL 10 MG PO CAPS: 10.0000 mg | ORAL_CAPSULE | Freq: Two times a day (BID) | ORAL | 3 refills | Status: DC

## 2016-06-29 DIAGNOSIS — G4733 Obstructive sleep apnea (adult) (pediatric): Secondary | ICD-10-CM | POA: Diagnosis not present

## 2016-07-04 DIAGNOSIS — R490 Dysphonia: Secondary | ICD-10-CM | POA: Diagnosis not present

## 2016-07-04 DIAGNOSIS — J018 Other acute sinusitis: Secondary | ICD-10-CM | POA: Diagnosis not present

## 2016-07-11 DIAGNOSIS — B86 Scabies: Secondary | ICD-10-CM | POA: Diagnosis not present

## 2016-07-14 ENCOUNTER — Other Ambulatory Visit (HOSPITAL_BASED_OUTPATIENT_CLINIC_OR_DEPARTMENT_OTHER): Payer: Medicare Other

## 2016-07-14 DIAGNOSIS — C61 Malignant neoplasm of prostate: Secondary | ICD-10-CM | POA: Diagnosis not present

## 2016-07-14 DIAGNOSIS — Z8546 Personal history of malignant neoplasm of prostate: Secondary | ICD-10-CM

## 2016-07-14 DIAGNOSIS — Z8551 Personal history of malignant neoplasm of bladder: Secondary | ICD-10-CM | POA: Diagnosis not present

## 2016-07-14 DIAGNOSIS — C679 Malignant neoplasm of bladder, unspecified: Secondary | ICD-10-CM

## 2016-07-14 LAB — COMPREHENSIVE METABOLIC PANEL
ALT: 28 U/L (ref 0–55)
AST: 23 U/L (ref 5–34)
Albumin: 3.7 g/dL (ref 3.5–5.0)
Alkaline Phosphatase: 78 U/L (ref 40–150)
Anion Gap: 7 mEq/L (ref 3–11)
BILIRUBIN TOTAL: 1.19 mg/dL (ref 0.20–1.20)
BUN: 13 mg/dL (ref 7.0–26.0)
CALCIUM: 9.6 mg/dL (ref 8.4–10.4)
CHLORIDE: 105 meq/L (ref 98–109)
CO2: 28 meq/L (ref 22–29)
CREATININE: 1 mg/dL (ref 0.7–1.3)
EGFR: 76 mL/min/{1.73_m2} — ABNORMAL LOW (ref >90)
Glucose: 100 mg/dl (ref 70–140)
Potassium: 4.2 mEq/L (ref 3.5–5.1)
Sodium: 140 mEq/L (ref 136–145)
TOTAL PROTEIN: 6.9 g/dL (ref 6.4–8.3)

## 2016-07-14 LAB — CBC WITH DIFFERENTIAL/PLATELET
BASO%: 0.1 % (ref 0.0–2.0)
Basophils Absolute: 0 10*3/uL (ref 0.0–0.1)
EOS%: 0.5 % (ref 0.0–7.0)
Eosinophils Absolute: 0.1 10*3/uL (ref 0.0–0.5)
HEMATOCRIT: 42.5 % (ref 38.4–49.9)
HGB: 14.4 g/dL (ref 13.0–17.1)
LYMPH#: 1.2 10*3/uL (ref 0.9–3.3)
LYMPH%: 12 % — ABNORMAL LOW (ref 14.0–49.0)
MCH: 30.2 pg (ref 27.2–33.4)
MCHC: 33.9 g/dL (ref 32.0–36.0)
MCV: 89.1 fL (ref 79.3–98.0)
MONO#: 0.7 10*3/uL (ref 0.1–0.9)
MONO%: 7 % (ref 0.0–14.0)
NEUT%: 80.4 % — AB (ref 39.0–75.0)
NEUTROS ABS: 8.3 10*3/uL — AB (ref 1.5–6.5)
PLATELETS: 139 10*3/uL — AB (ref 140–400)
RBC: 4.77 10*6/uL (ref 4.20–5.82)
RDW: 13.9 % (ref 11.0–14.6)
WBC: 10.3 10*3/uL (ref 4.0–10.3)

## 2016-07-15 LAB — PSA

## 2016-07-21 ENCOUNTER — Ambulatory Visit (HOSPITAL_BASED_OUTPATIENT_CLINIC_OR_DEPARTMENT_OTHER): Payer: Medicare Other | Admitting: Oncology

## 2016-07-21 ENCOUNTER — Telehealth: Payer: Self-pay | Admitting: Oncology

## 2016-07-21 VITALS — BP 158/77 | HR 72 | Temp 97.7°F | Resp 16 | Ht 71.0 in | Wt 226.0 lb

## 2016-07-21 DIAGNOSIS — Z7901 Long term (current) use of anticoagulants: Secondary | ICD-10-CM

## 2016-07-21 DIAGNOSIS — Z8546 Personal history of malignant neoplasm of prostate: Secondary | ICD-10-CM | POA: Diagnosis not present

## 2016-07-21 DIAGNOSIS — Z8551 Personal history of malignant neoplasm of bladder: Secondary | ICD-10-CM

## 2016-07-21 DIAGNOSIS — C679 Malignant neoplasm of bladder, unspecified: Secondary | ICD-10-CM

## 2016-07-21 DIAGNOSIS — Z86718 Personal history of other venous thrombosis and embolism: Secondary | ICD-10-CM

## 2016-07-21 NOTE — Progress Notes (Signed)
Hematology and Oncology Follow Up Visit  [name] [id number] [birth year] 70 y.o. 07/21/2016 8:54 AM [name], MDPharr, Andrew Jew, MD   Principle Diagnosis: 70 year old gentleman diagnosed with prostate cancer in 2010. He had a Gleason score 3+3 = 6.    Prior Therapy: Status post prostatectomy done on 10/20/2008.  Current therapy: Observation and surveillance.   Interim History: [name] presents today for a follow-up visit. Since the last visit, he reports no major changes in his health. He did have a sinus infection that has currently resolved at this time. He did have a rectal fistula surgery and have recovered very well in June 2017. His appetite is excellent and have gained more weight. He does take  He has not reported any thrombosis or bleeding complications. He denied any hematuria, dysuria or genitourinary complaints. He does report some periodic chest pain and dyspnea continues to follow with cardiology regarding these issues.   He does not report any headaches or blurry vision double vision or syncope. Does not report any fevers chills or sweats. Does not report any chest pain, palpitation or orthopnea. Does not report any nausea, vomiting abdominal pain. Does not report any frequency urgency or hesitancy. He does not report any skeletal complaints. Rest of his review of systems unremarkable.  Medications: I have reviewed the patient's current medications.  Current Outpatient Prescriptions  Medication Sig Dispense Refill  . ALPRAZolam (XANAX) 0.5 MG tablet Take 0.5 mg by mouth 3 (three) times daily as needed.     Marland Kitchen co-enzyme Q-10 30 MG capsule Take 30 mg by mouth every evening.     Marland Kitchen CRESTOR 5 MG tablet TAKE 1 TABLET BY MOUTH EVERY DAY (Patient taking differently: TAKE 1 TABLET BY MOUTH EVERY DAY-- takes in am) 30 tablet 8  . fish oil-omega-3 fatty acids 1000 MG capsule Take 1 g by mouth daily.     . furosemide (LASIX) 40 MG tablet Take 40 mg by mouth. Three  times a week    . Hypertonic Nasal Wash (SINUS RINSE BOTTLE KIT) PACK Place into the nose at bedtime.      Marland Kitchen KLOR-CON 10 10 MEQ tablet Take 10 mEq by mouth 3 (three) times a week. Takes w/ lasix    . metoprolol (LOPRESSOR) 100 MG tablet TAKE 1 TABLET (100 MG TOTAL) BY MOUTH 2 (TWO) TIMES DAILY. 60 tablet 1  . Multiple Vitamin (MULTIVITAMIN) tablet Take 1 tablet by mouth daily.      Marland Kitchen oxyCODONE (OXY IR/ROXICODONE) 5 MG immediate release tablet Take 1-2 tablets (5-10 mg total) by mouth every 4 (four) hours as needed for moderate pain, severe pain or breakthrough pain. 40 tablet 0  . oxymetazoline (AFRIN) 0.05 % nasal spray Place 2 sprays into the nose 2 (two) times daily.      . pantoprazole (PROTONIX) 40 MG tablet Take 40 mg by mouth every morning.     . ramipril (ALTACE) 10 MG capsule Take 1 capsule (10 mg total) by mouth 2 (two) times daily. 180 capsule 3  . Rivaroxaban (XARELTO) 20 MG TABS Take 20 mg by mouth daily.    Marland Kitchen rOPINIRole (REQUIP) 1 MG tablet Take 2 mg by mouth at bedtime.     . sertraline (ZOLOFT) 100 MG tablet Take 100 mg by mouth daily.    . sildenafil (VIAGRA) 50 MG tablet Take 100 mg by mouth daily as needed.      No current facility-administered medications for this visit.      Allergies:  Allergies  Allergen Reactions  . Aspirin Other (See Comments)    REACTION: gi upset with higher dose  . Penicillins Itching    Past Medical History, Surgical history, Social history, and Family History were reviewed and updated.  Physical Exam: Blood pressure (!) 158/77, pulse 72, temperature 97.7 F (36.5 C), temperature source Oral, resp. rate 16, height 5' 11"  (1.803 m), weight 226 lb (102.5 kg), SpO2 94 %. ECOG: 0 General appearance: Well-appearing gentleman without distress. Head: Normocephalic, without obvious abnormality no oral thrush. Neck: no adenopathy Lymph nodes: Cervical, supraclavicular, and axillary nodes normal. Heart:regular rate and rhythm, S1, S2 normal, no  murmur, click, rub or gallop Lung:chest clear, no wheezing, rales, normal symmetric air entry.  Chest wall examination: No erythema, induration or rash. Abdomin: soft, non-tender, without masses or organomegaly no shifting dullness or ascites. EXT:no erythema, induration, or nodules   Lab Results: Lab Results  Component Value Date   WBC 10.3 07/14/2016   HGB 14.4 07/14/2016   HCT 42.5 07/14/2016   MCV 89.1 07/14/2016   PLT 139 (L) 07/14/2016     Chemistry      Component Value Date/Time   NA 140 07/14/2016 0834   K 4.2 07/14/2016 0834   CL 103 04/12/2016 1219   CL 103 11/02/2012 0737   CO2 28 07/14/2016 0834   BUN 13.0 07/14/2016 0834   CREATININE 1.0 07/14/2016 0834      Component Value Date/Time   CALCIUM 9.6 07/14/2016 0834   ALKPHOS 78 07/14/2016 0834   AST 23 07/14/2016 0834   ALT 28 07/14/2016 0834   BILITOT 1.19 07/14/2016 0834      Results for [name] (MRN [id number]) as of 07/21/2016 08:11  Ref. Range 07/13/2015 08:30 07/14/2016 08:34  PSA Latest Ref Range: 0.0 - 4.0 ng/mL <0.01 <0.1     Impression and Plan:   70 year old gentleman with the following issues:  1. Prostate cancer diagnosed in February 2010. It is Gleason score 3+3 = 6. He is status post robotic prostatectomy done on 10/20/2008.   PSA obtained on 07/14/2016 remains undetectable without any signs and symptoms of recurrent disease.  2. History of superficial bladder tumor status post TURBT and continues to follow with urology regarding this issue.  3. Flank pain and right-sided chest pain: His CT scan obtained in December 2016 showed no evidence of malignancy. His pain has resolved at this time.  4. History of DVT: He remains on Xarelto.  5. Follow-up: Will be in 1 year.    [facility], MD 12/7/20178:54 AM

## 2016-07-21 NOTE — Telephone Encounter (Signed)
Gave patient avs report and appointments for November and December 2018.

## 2016-07-23 ENCOUNTER — Other Ambulatory Visit: Payer: Self-pay | Admitting: Cardiovascular Disease

## 2016-07-29 DIAGNOSIS — G4733 Obstructive sleep apnea (adult) (pediatric): Secondary | ICD-10-CM | POA: Diagnosis not present

## 2016-08-03 DIAGNOSIS — G4733 Obstructive sleep apnea (adult) (pediatric): Secondary | ICD-10-CM | POA: Diagnosis not present

## 2016-08-03 DIAGNOSIS — I712 Thoracic aortic aneurysm, without rupture: Secondary | ICD-10-CM | POA: Diagnosis not present

## 2016-08-03 DIAGNOSIS — I10 Essential (primary) hypertension: Secondary | ICD-10-CM | POA: Diagnosis not present

## 2016-08-03 DIAGNOSIS — I728 Aneurysm of other specified arteries: Secondary | ICD-10-CM | POA: Diagnosis not present

## 2016-08-25 ENCOUNTER — Ambulatory Visit (INDEPENDENT_AMBULATORY_CARE_PROVIDER_SITE_OTHER): Payer: Medicare Other | Admitting: Internal Medicine

## 2016-08-25 ENCOUNTER — Telehealth: Payer: Self-pay

## 2016-08-25 ENCOUNTER — Encounter: Payer: Self-pay | Admitting: Internal Medicine

## 2016-08-25 VITALS — BP 160/84 | HR 72 | Ht 71.0 in | Wt 229.4 lb

## 2016-08-25 DIAGNOSIS — Z7901 Long term (current) use of anticoagulants: Secondary | ICD-10-CM | POA: Diagnosis not present

## 2016-08-25 DIAGNOSIS — R195 Other fecal abnormalities: Secondary | ICD-10-CM | POA: Diagnosis not present

## 2016-08-25 DIAGNOSIS — R194 Change in bowel habit: Secondary | ICD-10-CM

## 2016-08-25 NOTE — Telephone Encounter (Signed)
[facility] [address] [place] [address]  08/25/2016   RE: [name] DOB: 1946-07-04 MRN: [id number]   Dear Dr [name],    We have scheduled the above patient for an endoscopic procedure. Our records show that he is on anticoagulation therapy.   Please advise as to how long the patient may come off his therapy of Xarelto prior to the colonoscopy procedure, which is scheduled for 09/07/16.  Dr Carlean Purl was hoping to see if we could just hold it for 24 hours prior to colonoscopy and restart the day of colonoscopy or day after.  Please fax back/ or route the completed form to Natale Barba [place], [place] at [phone].   Sincerely,    Silvano Rusk, MD, [facility]

## 2016-08-25 NOTE — Progress Notes (Signed)
   [name] 71 y.o. 07/05/1946 [id number]  Assessment & Plan:   Encounter Diagnoses  Name Primary?  . Decreased stool caliber Yes  . Change in bowel habits   . Anticoagulated    Could be related to prior ano-rectal surgery but I am a bit perplexed as to why now and not sooner. Since change is persistent and last colonoscopy 4+ yeras ago (negative) we have decided to do colonoscopy exam - The risks and benefits as well as alternatives of endoscopic procedure(s) have been discussed and reviewed. All questions answered. The patient agrees to proceed.  Will hold Xarelto 24 hrs before and anticipate returning to Tx same day or next day after colonoscopy - I don't think he should need a Lovenox window with that plan - will confirm w/ [name]. Patient understands risk of recurrent blod clot and pulmonary embolism of treatment  I appreciate the opportunity to care for this patient. Andrew Mayer, MD, [name]  Cc: [name]   Subjective:   Chief Complaint: change in bowel habits  HPI [name] is here with 4 week hx of flatter stools - no pain or bleeding. No diet change. Has a photo which shows brown stool, clearly flattened and thinner. Stools do not return to NL caliber. No pain or rectal bleeding since anal fistula surgery in July - that was successful. Slight pelvic discomfort  Medications, allergies, past medical history, past surgical history, family history and social history are reviewed and updated in the EMR.   Review of Systems As above Continues on Xarelto for hx PE x 2 - anti-phospholipid Ab syndrome  Objective:   Physical Exam BP (!) 160/84   Pulse 72   Ht 5\' 11"  (1.803 m)   Wt 229 lb 6 oz (104 kg)   BMI 31.99 kg/m  NAD Eyes: Anicteric Rectal - post-surgical changes r ost anoderm, mild stenosis/spasm, no mass, no stool and not tender  15 minutes time spent with patient > half in counseling coordination of care

## 2016-08-25 NOTE — Patient Instructions (Signed)
   You have been scheduled for a colonoscopy. Please follow written instructions given to you at your visit today.  Please pick up your prep supplies at the pharmacy. If you use inhalers (even only as needed), please bring them with you on the day of your procedure. Your physician has requested that you go to www.startemmi.com and enter the access code given to you at your visit today. This web site gives a general overview about your procedure. However, you should still follow specific instructions given to you by our office regarding your preparation for the procedure.    You will be contaced by our office prior to your procedure for directions on holding your Eliquis.  If you do not hear from our office 1 week prior to your scheduled procedure, please call [phone] to discuss.    I appreciate the opportunity to care for you. Silvano Rusk, MD, [facility]

## 2016-08-29 DIAGNOSIS — G4733 Obstructive sleep apnea (adult) (pediatric): Secondary | ICD-10-CM | POA: Diagnosis not present

## 2016-08-29 NOTE — Telephone Encounter (Signed)
Have faxed this clearance letter to Dr Katrine Coho office at fax # [phone], will await clearance.

## 2016-09-02 NOTE — Telephone Encounter (Signed)
Called and told [name] that we are still waiting on his xarelto clearance from Dr Katrine Coho office.  I've called and talked with them this AM and also re-faxed the clearance letter.  They close early on Friday so I'll call them again early Monday AM.  [name] takes his xarelto at night- FYI.

## 2016-09-02 NOTE — Telephone Encounter (Signed)
They called from Dr Pennie Banter office and said per Dr Shelia Media he can hold his Xarelto for 24 hours prior to his procedure. I told them I need this in writing.  She is going to fax this to me and then I'll call [name].

## 2016-09-05 NOTE — Telephone Encounter (Signed)
Patient informed to hold his xarelto for 24 hours and he verbalized understanding.

## 2016-09-07 ENCOUNTER — Ambulatory Visit (AMBULATORY_SURGERY_CENTER): Payer: Medicare Other | Admitting: Internal Medicine

## 2016-09-07 ENCOUNTER — Encounter: Payer: Self-pay | Admitting: Internal Medicine

## 2016-09-07 VITALS — BP 161/83 | HR 60 | Temp 97.7°F | Resp 11 | Ht 71.0 in | Wt 229.0 lb

## 2016-09-07 DIAGNOSIS — D123 Benign neoplasm of transverse colon: Secondary | ICD-10-CM

## 2016-09-07 DIAGNOSIS — G4733 Obstructive sleep apnea (adult) (pediatric): Secondary | ICD-10-CM | POA: Diagnosis not present

## 2016-09-07 DIAGNOSIS — R195 Other fecal abnormalities: Secondary | ICD-10-CM | POA: Diagnosis present

## 2016-09-07 DIAGNOSIS — D125 Benign neoplasm of sigmoid colon: Secondary | ICD-10-CM

## 2016-09-07 DIAGNOSIS — Z86010 Personal history of colonic polyps: Secondary | ICD-10-CM

## 2016-09-07 MED ORDER — SODIUM CHLORIDE 0.9 % IV SOLN: 500.0000 mL | INTRAVENOUS | Status: DC

## 2016-09-07 NOTE — Patient Instructions (Addendum)
I found and removed 3 polyps - all look benign.  You also have a condition called diverticulosis - common and not usually a problem. Please read the handout provided.  I will let you know pathology results and when to have another routine colonoscopy by mail.  Please restart Xarelto tomorrow.  I appreciate the opportunity to care for you. Gatha Mayer, MD, FACG YOU HAD AN ENDOSCOPIC PROCEDURE TODAY AT [facility]:   Refer to the procedure report that was given to you for any specific questions about what was found during the examination.  If the procedure report does not answer your questions, please call your gastroenterologist to clarify.  If you requested that your care partner not be given the details of your procedure findings, then the procedure report has been included in a sealed envelope for you to review at your convenience later.  YOU SHOULD EXPECT: Some feelings of bloating in the abdomen. Passage of more gas than usual.  Walking can help get rid of the air that was put into your GI tract during the procedure and reduce the bloating. If you had a lower endoscopy (such as a colonoscopy or flexible sigmoidoscopy) you may notice spotting of blood in your stool or on the toilet paper. If you underwent a bowel prep for your procedure, you may not have a normal bowel movement for a few days.  Please Note:  You might notice some irritation and congestion in your nose or some drainage.  This is from the oxygen used during your procedure.  There is no need for concern and it should clear up in a day or so.  SYMPTOMS TO REPORT IMMEDIATELY:   Following lower endoscopy (colonoscopy or flexible sigmoidoscopy):  Excessive amounts of blood in the stool  Significant tenderness or worsening of abdominal pains  Swelling of the abdomen that is new, acute  Fever of 100F or higher  For urgent or emergent issues, a gastroenterologist can be reached at any hour by calling  [phone].   DIET:  We do recommend a small meal at first, but then you may proceed to your regular diet.  Drink plenty of fluids but you should avoid alcoholic beverages for 24 hours.  ACTIVITY:  You should plan to take it easy for the rest of today and you should NOT DRIVE or use heavy machinery until tomorrow (because of the sedation medicines used during the test).    FOLLOW UP: Our staff will call the number listed on your records the next business day following your procedure to check on you and address any questions or concerns that you may have regarding the information given to you following your procedure. If we do not reach you, we will leave a message.  However, if you are feeling well and you are not experiencing any problems, there is no need to return our call.  We will assume that you have returned to your regular daily activities without incident.  If any biopsies were taken you will be contacted by phone or by letter within the next 1-3 weeks.  Please call us at [phone] if you have not heard about the biopsies in 3 weeks.   Diverticulosis (handout given) Polyps (handout given) Resume Xarelto (rivaroxaban) at prior dose tomorrow Await biopsy results to determined next repeat Colonoscopy   SIGNATURES/CONFIDENTIALITY: You and/or your care partner have signed paperwork which will be entered into your electronic medical record.  These signatures attest to the fact  that that the information above on your After Visit Summary has been reviewed and is understood.  Full responsibility of the confidentiality of this discharge information lies with you and/or your care-partner.

## 2016-09-07 NOTE — Op Note (Signed)
[facility] Patient Name: [name] Procedure Date: 09/07/2016 3:51 PM MRN: [id number] Endoscopist: Gatha Mayer , MD Age: 71 Referring MD:  Date of Birth: [birth year] Gender: Male Account #: [id number] Procedure:                Colonoscopy Indications:              Change in stool caliber Medicines:                Propofol per Anesthesia, Monitored Anesthesia Care Procedure:                Pre-Anesthesia Assessment:                           - Prior to the procedure, a History and Physical                            was performed, and patient medications and                            allergies were reviewed. The patient's tolerance of                            previous anesthesia was also reviewed. The risks                            and benefits of the procedure and the sedation                            options and risks were discussed with the patient.                            All questions were answered, and informed consent                            was obtained. Prior Anticoagulants: The patient                            last took Xarelto (rivaroxaban) 2 days prior to the                            procedure. ASA Grade Assessment: III - A patient                            with severe systemic disease. After reviewing the                            risks and benefits, the patient was deemed in                            satisfactory condition to undergo the procedure.                           After obtaining informed consent, the colonoscope  was passed under direct vision. Throughout the                            procedure, the patient's blood pressure, pulse, and                            oxygen saturations were monitored continuously. The                            Colonoscope was introduced through the anus and                            advanced to the the cecum, identified by                            appendiceal  orifice and ileocecal valve. The                            patient tolerated the procedure well. The                            colonoscopy was somewhat difficult due to                            significant looping. Successful completion of the                            procedure was aided by using manual pressure. The                            ileocecal valve, appendiceal orifice, and rectum                            were photographed. The quality of the bowel                            preparation was adequate. Scope In: 3:59:27 PM Scope Out: 4:29:59 PM Scope Withdrawal Time: 0 hours 13 minutes 9 seconds  Total Procedure Duration: 0 hours 30 minutes 32 seconds  Findings:                 Three sessile polyps were found in the sigmoid                            colon and transverse colon. The polyps were 5 to 8                            mm in size. These polyps were removed with a cold                            snare. Resection and retrieval were complete.                            Verification of patient identification for the  specimen was done. Estimated blood loss was minimal.                           Multiple small and large-mouthed diverticula were                            found in the sigmoid colon.                           The exam was otherwise without abnormality on                            direct and retroflexion views.                           The perianal examination was normal.                           The digital rectal exam findings include surgically                            absent prostate. Complications:            No immediate complications. Estimated Blood Loss:     Estimated blood loss was minimal. Impression:               - Three 5 to 8 mm polyps in the sigmoid colon and                            in the transverse colon, removed with a cold snare.                            Resected and retrieved.                            - Diverticulosis in the sigmoid colon.                           - The examination was otherwise normal on direct                            and retroflexion views.                           - Personal history of colonic polyp - adenoma 2005 Recommendation:           - Patient has a contact number available for                            emergencies. The signs and symptoms of potential                            delayed complications were discussed with the                            patient. Return to normal activities tomorrow.  Written discharge instructions were provided to the                            patient.                           - Resume previous diet.                           - Continue present medications.                           - Resume Xarelto (rivaroxaban) at prior dose                            tomorrow.                           - Repeat colonoscopy is recommended. The                            colonoscopy date will be determined after pathology                            results from today's exam become available for                            review. Gatha Mayer, MD 09/07/2016 4:41:05 PM This report has been signed electronically.

## 2016-09-07 NOTE — Progress Notes (Signed)
Called to room to assist during endoscopic procedure.  Patient ID and intended procedure confirmed with present staff. Received instructions for my participation in the procedure from the performing physician.  

## 2016-09-08 ENCOUNTER — Telehealth: Payer: Self-pay | Admitting: *Deleted

## 2016-09-08 NOTE — Telephone Encounter (Signed)
  Follow up Call-  Call back number 09/07/2016  Post procedure Call Back phone  # [phone]  Permission to leave phone message Yes  Some recent data might be hidden     Patient questions:  Do you have a fever, pain , or abdominal swelling? No. Pain Score  0 *  Have you tolerated food without any problems? Yes.    Have you been able to return to your normal activities? Yes.    Do you have any questions about your discharge instructions: Diet   No. Medications  No. Follow up visit  No.  Do you have questions or concerns about your Care? No.  Actions: * If pain score is 4 or above: No action needed, pain <4.

## 2016-09-12 DIAGNOSIS — Z1321 Encounter for screening for nutritional disorder: Secondary | ICD-10-CM | POA: Diagnosis not present

## 2016-09-12 DIAGNOSIS — Z79899 Other long term (current) drug therapy: Secondary | ICD-10-CM | POA: Diagnosis not present

## 2016-09-12 DIAGNOSIS — E7800 Pure hypercholesterolemia, unspecified: Secondary | ICD-10-CM | POA: Diagnosis not present

## 2016-09-12 DIAGNOSIS — D6859 Other primary thrombophilia: Secondary | ICD-10-CM | POA: Diagnosis not present

## 2016-09-12 DIAGNOSIS — Z0000 Encounter for general adult medical examination without abnormal findings: Secondary | ICD-10-CM | POA: Diagnosis not present

## 2016-09-12 DIAGNOSIS — Z125 Encounter for screening for malignant neoplasm of prostate: Secondary | ICD-10-CM | POA: Diagnosis not present

## 2016-09-13 ENCOUNTER — Encounter: Payer: Self-pay | Admitting: Internal Medicine

## 2016-09-13 NOTE — Progress Notes (Signed)
3 adenomas Repeat colonoscopy 3 years 2021 Letter routed by My Chart

## 2016-09-28 DIAGNOSIS — G4733 Obstructive sleep apnea (adult) (pediatric): Secondary | ICD-10-CM | POA: Diagnosis not present

## 2016-09-28 DIAGNOSIS — I10 Essential (primary) hypertension: Secondary | ICD-10-CM | POA: Diagnosis not present

## 2016-09-28 DIAGNOSIS — H8110 Benign paroxysmal vertigo, unspecified ear: Secondary | ICD-10-CM | POA: Diagnosis not present

## 2016-09-28 DIAGNOSIS — I712 Thoracic aortic aneurysm, without rupture: Secondary | ICD-10-CM | POA: Diagnosis not present

## 2016-09-29 DIAGNOSIS — G4733 Obstructive sleep apnea (adult) (pediatric): Secondary | ICD-10-CM | POA: Diagnosis not present

## 2016-10-06 DIAGNOSIS — H8111 Benign paroxysmal vertigo, right ear: Secondary | ICD-10-CM | POA: Diagnosis not present

## 2016-10-13 DIAGNOSIS — H8111 Benign paroxysmal vertigo, right ear: Secondary | ICD-10-CM | POA: Diagnosis not present

## 2016-10-27 DIAGNOSIS — G4733 Obstructive sleep apnea (adult) (pediatric): Secondary | ICD-10-CM | POA: Diagnosis not present

## 2016-11-25 DIAGNOSIS — R7303 Prediabetes: Secondary | ICD-10-CM | POA: Diagnosis not present

## 2016-11-25 DIAGNOSIS — H18412 Arcus senilis, left eye: Secondary | ICD-10-CM | POA: Diagnosis not present

## 2016-11-25 DIAGNOSIS — H02839 Dermatochalasis of unspecified eye, unspecified eyelid: Secondary | ICD-10-CM | POA: Diagnosis not present

## 2016-11-25 DIAGNOSIS — I10 Essential (primary) hypertension: Secondary | ICD-10-CM | POA: Diagnosis not present

## 2016-11-25 DIAGNOSIS — H18411 Arcus senilis, right eye: Secondary | ICD-10-CM | POA: Diagnosis not present

## 2016-11-25 DIAGNOSIS — H2512 Age-related nuclear cataract, left eye: Secondary | ICD-10-CM | POA: Diagnosis not present

## 2016-11-27 DIAGNOSIS — G4733 Obstructive sleep apnea (adult) (pediatric): Secondary | ICD-10-CM | POA: Diagnosis not present

## 2016-12-21 DIAGNOSIS — Z8551 Personal history of malignant neoplasm of bladder: Secondary | ICD-10-CM | POA: Diagnosis not present

## 2016-12-22 ENCOUNTER — Encounter: Payer: Self-pay | Admitting: *Deleted

## 2016-12-27 DIAGNOSIS — G4733 Obstructive sleep apnea (adult) (pediatric): Secondary | ICD-10-CM | POA: Diagnosis not present

## 2017-01-02 DIAGNOSIS — Z8349 Family history of other endocrine, nutritional and metabolic diseases: Secondary | ICD-10-CM | POA: Diagnosis not present

## 2017-01-02 DIAGNOSIS — R1012 Left upper quadrant pain: Secondary | ICD-10-CM | POA: Diagnosis not present

## 2017-01-02 DIAGNOSIS — I10 Essential (primary) hypertension: Secondary | ICD-10-CM | POA: Diagnosis not present

## 2017-01-26 DIAGNOSIS — G4733 Obstructive sleep apnea (adult) (pediatric): Secondary | ICD-10-CM | POA: Diagnosis not present

## 2017-01-27 DIAGNOSIS — G4733 Obstructive sleep apnea (adult) (pediatric): Secondary | ICD-10-CM | POA: Diagnosis not present

## 2017-02-20 ENCOUNTER — Encounter: Payer: Self-pay | Admitting: Internal Medicine

## 2017-02-20 ENCOUNTER — Telehealth: Payer: Self-pay

## 2017-02-20 ENCOUNTER — Ambulatory Visit (INDEPENDENT_AMBULATORY_CARE_PROVIDER_SITE_OTHER): Payer: Medicare Other | Admitting: Internal Medicine

## 2017-02-20 VITALS — BP 104/74 | HR 72 | Ht 71.0 in | Wt 232.0 lb

## 2017-02-20 DIAGNOSIS — L298 Other pruritus: Secondary | ICD-10-CM | POA: Diagnosis not present

## 2017-02-20 DIAGNOSIS — R1319 Other dysphagia: Secondary | ICD-10-CM

## 2017-02-20 DIAGNOSIS — R1310 Dysphagia, unspecified: Secondary | ICD-10-CM

## 2017-02-20 DIAGNOSIS — B356 Tinea cruris: Secondary | ICD-10-CM

## 2017-02-20 DIAGNOSIS — R1012 Left upper quadrant pain: Secondary | ICD-10-CM | POA: Diagnosis not present

## 2017-02-20 MED ORDER — PANTOPRAZOLE SODIUM 40 MG PO TBEC: 40.0000 mg | DELAYED_RELEASE_TABLET | Freq: Every day | ORAL | 3 refills | Status: DC

## 2017-02-20 MED ORDER — NYSTATIN 100000 UNIT/GM EX CREA: 1.0000 "application " | TOPICAL_CREAM | Freq: Two times a day (BID) | CUTANEOUS | 2 refills | Status: DC

## 2017-02-20 NOTE — Progress Notes (Signed)
[name] 71 y.o. 12-07-1945 [id number]  Assessment & Plan:   Encounter Diagnoses  Name Primary?  . LUQ pain Yes  . Esophageal dysphagia   . Jock itch     EGD, possible dilation - this helped him in past. ? Dysphagia here. ? Functional GI sxs. Hold Xarelt night before and try to restart next day depending upon what is done. The risks and benefits as well as alternatives of endoscopic procedure(s) have been discussed and reviewed. All questions answered. The patient agrees to proceed.  I appreciate the opportunity to care for this patient. VP:XTGGY, [name], MD    Subjective:   Chief Complaint: LUQ pain  HPI [name] is here with c/o of a few mos of a burning irritating pain in LUQ. Has a feeling of food sitting stomach (epigastrium) after he eats and then has some regurgitation and reflux. Switched to Prilosec OTC but that did not seem to make much difference.  No clear impact dysphagia. Says similar to what Dr. Sharlett Iles evaluated and treated with EGD and [facility] dilation in 2012 - and says that helped. No weight loss. Tried Miralax for constipation and it helped - per [name] but did not improve LUQ pain. Allergies  Allergen Reactions  . Aspirin Other (See Comments)    REACTION: gi upset with higher dose  . Penicillins Itching   Current Meds  Medication Sig  . ALPRAZolam (XANAX) 0.5 MG tablet Take 0.5 mg by mouth 3 (three) times daily as needed.   Marland Kitchen co-enzyme Q-10 30 MG capsule Take 30 mg by mouth every evening.   Marland Kitchen CRESTOR 5 MG tablet TAKE 1 TABLET BY MOUTH EVERY DAY (Patient taking differently: TAKE 1 TABLET BY MOUTH EVERY DAY-- takes in am)  . fish oil-omega-3 fatty acids 1000 MG capsule Take 1 g by mouth daily.   . hydrochlorothiazide (HYDRODIURIL) 25 MG tablet Take 25 mg by mouth daily.  . Hypertonic Nasal Wash (SINUS RINSE BOTTLE KIT) PACK Place into the nose at bedtime.    . metoprolol (LOPRESSOR) 100 MG tablet TAKE 1 TABLET BY MOUTH TWICE A DAY  .  Multiple Vitamin (MULTIVITAMIN) tablet Take 1 tablet by mouth daily.    Marland Kitchen oxyCODONE (OXY IR/ROXICODONE) 5 MG immediate release tablet Take 1-2 tablets (5-10 mg total) by mouth every 4 (four) hours as needed for moderate pain, severe pain or breakthrough pain.  Marland Kitchen oxymetazoline (AFRIN) 0.05 % nasal spray Place 2 sprays into the nose 2 (two) times daily.    . pantoprazole (PROTONIX) 40 MG tablet Take 1 tablet (40 mg total) by mouth daily.  . polyethylene glycol powder (GLYCOLAX/MIRALAX) powder Take 1 Container by mouth daily. 1  1/2 capful daily  . Psyllium (METAMUCIL FIBER PO) Take by mouth. 2 tablespoons mixed with miralax daily  . ramipril (ALTACE) 10 MG capsule Take 1 capsule (10 mg total) by mouth 2 (two) times daily.  . Rivaroxaban (XARELTO) 20 MG TABS Take 20 mg by mouth daily.  Marland Kitchen rOPINIRole (REQUIP) 1 MG tablet Take 2 mg by mouth at bedtime.   . sertraline (ZOLOFT) 100 MG tablet Take 100 mg by mouth daily.  . sildenafil (VIAGRA) 50 MG tablet Take 100 mg by mouth daily as needed.   . [DISCONTINUED] pantoprazole (PROTONIX) 40 MG tablet Take 40 mg by mouth every morning.    Past Medical History:  Diagnosis Date  . Antiphospholipid antibody syndrome ([place]) 05/22/2012   Found subsequent to recurrent PE 9/13  . Arthritis   . Chronic  anal fissure    w/ recurrence's  . Chronic anxiety   . Depression   . ED (erectile dysfunction)   . Fatty liver   . GERD (gastroesophageal reflux disease)   . Heart murmur   . Hiatal hernia   . History of adenomatous polyp of colon    2005  . History of bladder cancer urologist-  dr grapey   papillary TCC  s/p  turbt  . History of DVT (deep vein thrombosis)    post prostate surgery 2010  right LLE  . History of esophageal stricture    w/ dilation  . History of gastric polyp    benign 2010  . History of prostate cancer urologist- dr grapey/  oncology-  dr Alen Blew   Stage  T1c , Gleason 3+3;   s/p  prostatectomy 03-08-210//  per pt currently PSA  nondetectable   . History of pulmonary embolus (PE)    post prostate surgery 2010  and 2013 x2 right side per CT scan (found to have lupus anticoaglated )  . History of TIA (transient ischemic attack)    07/ 2012 noted on scans remote probable tia's in  age 56's  . History of TMJ syndrome   . HTN (hypertension)   . Hyperlipidemia   . Lupus anticoagulant positive    found 2013 when pt had PE  . Moderate aortic regurgitation    with no  stenosis   . OSA on CPAP    per study severe osa 12-14-2010  . RLS (restless legs syndrome)   . RLS (restless legs syndrome)   . Symptomatic PVCs cardiologist-  dr berry   intermittant  . Wears glasses    Past Surgical History:  Procedure Laterality Date  . APPENDECTOMY  age 89   and Left Inguinal Hernia Repair  . CARDIOVASCULAR STRESS TEST  06-30-2011   dr berry   normal nuclear study/  normal LV function and wall motion, ef 56%  . COLONOSCOPY  last one 10-19-2011  . CYSTO/ BLADDER BX/  RETROGRADE PYELOGRAM/  TRANSRECTAL PROSTATE BX'S  07-30-2008  . ESOPHAGOGASTRODUODENOSCOPY  last one 06-27-2011  . EVALUATION UNDER ANESTHESIA WITH FISTULECTOMY N/A 01/14/2016   Procedure: EXAM UNDER ANESTHESIA WITH REPAIR OF PERIRECTAL FISTULA  (LIFT);  Surgeon: Michael Boston, MD;  [facility];  Service: General;  Laterality: N/A;  . LAPAROSCOPIC CHOLECYSTECTOMY  05-25-2007   and Excision cyst back of neck  . ROBOT ASSISTED LAPAROSCOPIC RADICAL PROSTATECTOMY  10-20-2008  . STRABISMUS SURGERY Bilateral age 55  . TONSILLECTOMY  age 87  . TRANSTHORACIC ECHOCARDIOGRAM  03-23-2015   dr berry   grade 1 diastolic dysfunction, ef 76-19%/  mild AV thickened leaflets with no stenosis,  moderate AR/  mild ascending aorta ilatation3.9cm/  trivial MR and TR   Social History   Social History  . Marital status: Married    Spouse name: N/A  . Number of children: 2  . Years of education: N/A   Occupational History  . Art gallery manager   . FI[employer]  [employer]   Social History Main Topics  . Smoking status: Current Some Day Smoker    Years: 51.00    Types: Cigars  . Smokeless tobacco: Never Used     Comment: 5 cigars /week--/  smoked cigarettes for 10 years quit 1990's  . Alcohol use 8.4 oz/week    14 Glasses of wine per week     Comment: 2 glasses wine/day, cut out wine , drinking beer  .  Drug use: No   Social History Narrative   Gets regular exercise.   Daily caffeine- 2 cups daily.   Education: [employer]   family history includes Colon cancer in his father; Colon polyps in his brother and father; Crohn's disease in his brother; Diabetes in his maternal grandmother; Heart disease in his maternal grandmother; Hypertension in his father and mother; Stroke in his father.   Review of Systems As per HPI Wants refill Nystatin cream for jock itch Objective:   Physical Exam BP 104/74   Pulse 72   Ht 5' 11"  (1.803 m)   Wt 232 lb (105.2 kg)   BMI 32.36 kg/m  NAD Eyes anicteric Lungs cta Cor S1s2 abd soft and NT Alert and oriented x 3

## 2017-02-20 NOTE — Telephone Encounter (Signed)
[facility] [address] [place] [address]  02/20/2017   RE: ARTURO SOFRANKO DOB: 1946-06-28 MRN: [id number]   Dear Dr [name],    We have scheduled the above patient for an endoscopic procedure. Our records show that he is on anticoagulation therapy.   Please advise as to how long the patient may come off his therapy of xarelto prior to the EGD procedure, which is scheduled for 03/03/17.  We would like to just hold it the night before the procedure if you think that would be acceptable.  Please fax back/ or route the completed form to Jahlen Bollman [place], [place]  at [phone].   Sincerely,    Silvano Rusk, MD

## 2017-02-20 NOTE — Patient Instructions (Addendum)
  You have been scheduled for an endoscopy. Please follow written instructions given to you at your visit today. If you use inhalers (even only as needed), please bring them with you on the day of your procedure.  You will be contaced by our office prior to your procedure for directions on holding your xarelto.  If you do not hear from our office 1 week prior to your scheduled procedure, please call [phone] to discuss.   Normal BMI (Body Mass Index- based on height and weight) is between 19 and 25. Your BMI today is Body mass index is 32.36 kg/m. Marland Kitchen Please consider follow up  regarding your BMI with your Primary Care Provider.   We have sent the following medications to your pharmacy for you to pick up at your convenience: Protonix, Nystatin   I appreciate the opportunity to care for you. Silvano Rusk, MD, [facility]

## 2017-02-23 ENCOUNTER — Encounter: Payer: Self-pay | Admitting: Internal Medicine

## 2017-02-23 NOTE — Telephone Encounter (Signed)
Patient informed that Dr [name] said he can hold his xarelto 24 hours prior to his EGD. Patient verbalized understanding.  The fax this information came on will be sent to be scanned into epic.

## 2017-02-26 DIAGNOSIS — G4733 Obstructive sleep apnea (adult) (pediatric): Secondary | ICD-10-CM | POA: Diagnosis not present

## 2017-03-03 ENCOUNTER — Encounter: Payer: Self-pay | Admitting: Internal Medicine

## 2017-03-03 ENCOUNTER — Ambulatory Visit (AMBULATORY_SURGERY_CENTER): Payer: Medicare Other | Admitting: Internal Medicine

## 2017-03-03 VITALS — BP 132/71 | HR 65 | Temp 96.6°F | Resp 10 | Ht 71.0 in | Wt 232.0 lb

## 2017-03-03 DIAGNOSIS — K219 Gastro-esophageal reflux disease without esophagitis: Secondary | ICD-10-CM | POA: Diagnosis not present

## 2017-03-03 DIAGNOSIS — K317 Polyp of stomach and duodenum: Secondary | ICD-10-CM | POA: Diagnosis not present

## 2017-03-03 DIAGNOSIS — R1012 Left upper quadrant pain: Secondary | ICD-10-CM

## 2017-03-03 DIAGNOSIS — G4733 Obstructive sleep apnea (adult) (pediatric): Secondary | ICD-10-CM | POA: Diagnosis not present

## 2017-03-03 DIAGNOSIS — K2950 Unspecified chronic gastritis without bleeding: Secondary | ICD-10-CM | POA: Diagnosis not present

## 2017-03-03 DIAGNOSIS — K2970 Gastritis, unspecified, without bleeding: Secondary | ICD-10-CM

## 2017-03-03 MED ORDER — SODIUM CHLORIDE 0.9 % IV SOLN: 500.0000 mL | INTRAVENOUS | Status: DC

## 2017-03-03 MED ORDER — NYSTATIN 100000 UNIT/ML MT SUSP: 5.0000 mL | Freq: Four times a day (QID) | OROMUCOSAL | 0 refills | Status: DC

## 2017-03-03 NOTE — Op Note (Addendum)
[facility] Patient Name: [name] Procedure Date: 03/03/2017 7:29 AM MRN: [id number] Endoscopist: Gatha Mayer , MD Age: 71 Referring MD:  Date of Birth: [birth year] Gender: Male Account #: [id number] Procedure:                Upper GI endoscopy Indications:              Abdominal pain in the left upper quadrant, Dyspepsia Medicines:                Propofol per Anesthesia, Monitored Anesthesia Care Procedure:                Pre-Anesthesia Assessment:                           - Prior to the procedure, a History and Physical                            was performed, and patient medications and                            allergies were reviewed. The patient's tolerance of                            previous anesthesia was also reviewed. The risks                            and benefits of the procedure and the sedation                            options and risks were discussed with the patient.                            All questions were answered, and informed consent                            was obtained. Prior Anticoagulants: The patient                            last took Xarelto (rivaroxaban) 1 day prior to the                            procedure. ASA Grade Assessment: III - A patient                            with severe systemic disease. After reviewing the                            risks and benefits, the patient was deemed in                            satisfactory condition to undergo the procedure.                           After obtaining informed consent, the endoscope was  passed under direct vision. Throughout the                            procedure, the patient's blood pressure, pulse, and                            oxygen saturations were monitored continuously. The                            Model GIF-HQ190 [id number]) scope was introduced                            through the mouth, and advanced to the second part                             of duodenum. The upper GI endoscopy was                            accomplished without difficulty. The patient                            tolerated the procedure well. Scope In: Scope Out: Findings:                 Ritta Slot was found in the oropharynx.                           Patchy candidiasis was found in the upper third of                            the esophagus.                           Multiple diminutive semi-sessile polyps were found                            in the cardia, in the gastric fundus and in the                            gastric body. Biopsies were taken with a cold                            forceps for histology. Estimated blood loss was                            minimal.                           Diffuse mild inflammation characterized by                            friability and granularity was found in the cardia,                            in the gastric fundus and in the gastric body.  Biopsies were taken with a cold forceps for                            histology. Verification of patient identification                            for the specimen was done. Estimated blood loss was                            minimal.                           The exam was otherwise without abnormality.                           The cardia and gastric fundus were normal on                            retroflexion. Complications:            No immediate complications. Estimated Blood Loss:     Estimated blood loss was minimal. Impression:               - Thrush was found in the oropharynx.                           - Monilial esophagitis.                           - Multiple gastric polyps. Biopsied.                           - Gastritis. Biopsied.                           - The examination was otherwise normal. Recommendation:           - Patient has a contact number available for                            emergencies.  The signs and symptoms of potential                            delayed complications were discussed with the                            patient. Return to normal activities tomorrow.                            Written discharge instructions were provided to the                            patient.                           - Resume previous diet.                           -  Continue present medications.                           - Resume Xarelto (rivaroxaban) at prior dose today.                           - Await pathology results.                           - Nystatin swish and swallow                           Try FD Donald Prose                           - reduce carbonated and sugary sodas/sweets Gatha Mayer, MD 03/03/2017 8:00:07 AM This report has been signed electronically.

## 2017-03-03 NOTE — Progress Notes (Signed)
Called to room to assist during endoscopic procedure.  Patient ID and intended procedure confirmed with present staff. Received instructions for my participation in the procedure from the performing physician.  

## 2017-03-03 NOTE — Patient Instructions (Addendum)
There were some stomach polyps - look benign - biopsied and unlikely to be a problem. Stomach lining mildly irritated - biopsied. Nothing bad  Also saw some thrush in mouth and slight in esophagus. Not likely a problem.  Plan 1) Treat thrush with Nystatin 2) Try FD Donald Prose for stomach symptoms 3) will notify about biopsies 4) restart Xarelto tonight  I appreciate the opportunity to care for you. Gatha Mayer, MD, [facility]   Discharge instructions given. Handout on Gastritis. Medication sent to pharmacy. Biopsies taken. Resume previous medications. YOU HAD AN ENDOSCOPIC PROCEDURE TODAY AT [facility]:   Refer to the procedure report that was given to you for any specific questions about what was found during the examination.  If the procedure report does not answer your questions, please call your gastroenterologist to clarify.  If you requested that your care partner not be given the details of your procedure findings, then the procedure report has been included in a sealed envelope for you to review at your convenience later.  YOU SHOULD EXPECT: Some feelings of bloating in the abdomen. Passage of more gas than usual.  Walking can help get rid of the air that was put into your GI tract during the procedure and reduce the bloating. If you had a lower endoscopy (such as a colonoscopy or flexible sigmoidoscopy) you may notice spotting of blood in your stool or on the toilet paper. If you underwent a bowel prep for your procedure, you may not have a normal bowel movement for a few days.  Please Note:  You might notice some irritation and congestion in your nose or some drainage.  This is from the oxygen used during your procedure.  There is no need for concern and it should clear up in a day or so.  SYMPTOMS TO REPORT IMMEDIATELY:   Following upper endoscopy (EGD)  Vomiting of blood or coffee ground material  New chest pain or pain under the shoulder  blades  Painful or persistently difficult swallowing  New shortness of breath  Fever of 100F or higher  Black, tarry-looking stools  For urgent or emergent issues, a gastroenterologist can be reached at any hour by calling [phone].   DIET:  We do recommend a small meal at first, but then you may proceed to your regular diet.  Drink plenty of fluids but you should avoid alcoholic beverages for 24 hours.  ACTIVITY:  You should plan to take it easy for the rest of today and you should NOT DRIVE or use heavy machinery until tomorrow (because of the sedation medicines used during the test).    FOLLOW UP: Our staff will call the number listed on your records the next business day following your procedure to check on you and address any questions or concerns that you may have regarding the information given to you following your procedure. If we do not reach you, we will leave a message.  However, if you are feeling well and you are not experiencing any problems, there is no need to return our call.  We will assume that you have returned to your regular daily activities without incident.  If any biopsies were taken you will be contacted by phone or by letter within the next 1-3 weeks.  Please call us at [phone] if you have not heard about the biopsies in 3 weeks.    SIGNATURES/CONFIDENTIALITY: You and/or your care partner have signed paperwork which will be entered into your  electronic medical record.  These signatures attest to the fact that that the information above on your After Visit Summary has been reviewed and is understood.  Full responsibility of the confidentiality of this discharge information lies with you and/or your care-partner.

## 2017-03-03 NOTE — Progress Notes (Signed)
Report to PACU, RN, vss, BBS= Clear.  

## 2017-03-06 ENCOUNTER — Telehealth: Payer: Self-pay | Admitting: *Deleted

## 2017-03-06 NOTE — Telephone Encounter (Signed)
  Follow up Call-  Call back number 03/03/2017 09/07/2016  Post procedure Call Back phone  # [phone]  Permission to leave phone message Yes Yes  Some recent data might be hidden     Patient questions:  Do you have a fever, pain , or abdominal swelling? No. Pain Score  0 *  Have you tolerated food without any problems? Yes.    Have you been able to return to your normal activities? Yes.    Do you have any questions about your discharge instructions: Diet   Yes.   Medications  Yes.   Follow up visit  No.  Do you have questions or concerns about your Care? No.  Actions: * If pain score is 4 or above: No action needed, pain <4.

## 2017-03-08 ENCOUNTER — Encounter: Payer: Self-pay | Admitting: Internal Medicine

## 2017-03-08 NOTE — Progress Notes (Signed)
Bxs benign polyps not precancerous and mild stomach inflammation.  I will send My Chart letter

## 2017-03-10 ENCOUNTER — Other Ambulatory Visit: Payer: Self-pay | Admitting: Internal Medicine

## 2017-03-10 DIAGNOSIS — B3781 Candidal esophagitis: Secondary | ICD-10-CM

## 2017-03-10 MED ORDER — FLUCONAZOLE 100 MG PO TABS: 100.0000 mg | ORAL_TABLET | Freq: Every day | ORAL | 0 refills | Status: DC

## 2017-03-10 NOTE — Progress Notes (Signed)
Fluconazole for thrush not relieved by Nystatin

## 2017-03-13 ENCOUNTER — Encounter: Payer: Self-pay | Admitting: Internal Medicine

## 2017-03-14 ENCOUNTER — Other Ambulatory Visit: Payer: Self-pay | Admitting: Internal Medicine

## 2017-03-14 DIAGNOSIS — B3781 Candidal esophagitis: Secondary | ICD-10-CM

## 2017-03-14 DIAGNOSIS — D6861 Antiphospholipid syndrome: Secondary | ICD-10-CM

## 2017-03-14 HISTORY — DX: Candidal esophagitis: B37.81

## 2017-03-15 ENCOUNTER — Other Ambulatory Visit: Payer: Medicare Other

## 2017-03-15 DIAGNOSIS — B3781 Candidal esophagitis: Secondary | ICD-10-CM | POA: Diagnosis not present

## 2017-03-15 DIAGNOSIS — D6861 Antiphospholipid syndrome: Secondary | ICD-10-CM

## 2017-03-15 LAB — HIV ANTIBODY (ROUTINE TESTING W REFLEX): HIV 1&2 Ab, 4th Generation: NONREACTIVE

## 2017-03-16 LAB — ANA: ANA: NEGATIVE

## 2017-03-16 NOTE — Progress Notes (Signed)
ANA and HIV negative My Chart

## 2017-03-21 DIAGNOSIS — M791 Myalgia: Secondary | ICD-10-CM | POA: Diagnosis not present

## 2017-03-21 DIAGNOSIS — B3781 Candidal esophagitis: Secondary | ICD-10-CM | POA: Diagnosis not present

## 2017-03-21 DIAGNOSIS — L299 Pruritus, unspecified: Secondary | ICD-10-CM | POA: Diagnosis not present

## 2017-03-21 DIAGNOSIS — I10 Essential (primary) hypertension: Secondary | ICD-10-CM | POA: Diagnosis not present

## 2017-03-29 ENCOUNTER — Other Ambulatory Visit: Payer: Self-pay | Admitting: Cardiovascular Disease

## 2017-03-29 DIAGNOSIS — G4733 Obstructive sleep apnea (adult) (pediatric): Secondary | ICD-10-CM | POA: Diagnosis not present

## 2017-04-05 ENCOUNTER — Encounter: Payer: Self-pay | Admitting: Cardiovascular Disease

## 2017-04-05 ENCOUNTER — Ambulatory Visit (INDEPENDENT_AMBULATORY_CARE_PROVIDER_SITE_OTHER): Payer: Medicare Other | Admitting: Cardiovascular Disease

## 2017-04-05 VITALS — BP 142/78 | HR 65 | Ht 71.0 in | Wt 231.6 lb

## 2017-04-05 DIAGNOSIS — I712 Thoracic aortic aneurysm, without rupture, unspecified: Secondary | ICD-10-CM

## 2017-04-05 DIAGNOSIS — I10 Essential (primary) hypertension: Secondary | ICD-10-CM

## 2017-04-05 DIAGNOSIS — E785 Hyperlipidemia, unspecified: Secondary | ICD-10-CM | POA: Diagnosis not present

## 2017-04-05 DIAGNOSIS — Z86718 Personal history of other venous thrombosis and embolism: Secondary | ICD-10-CM | POA: Diagnosis not present

## 2017-04-05 LAB — LIPID PANEL
CHOL/HDL RATIO: 4 ratio (ref 0.0–5.0)
Cholesterol, Total: 147 mg/dL (ref 100–199)
HDL: 37 mg/dL — AB (ref >39)
LDL Calculated: 83 mg/dL (ref 0–99)
Triglycerides: 134 mg/dL (ref 0–149)
VLDL Cholesterol Cal: 27 mg/dL (ref 5–40)

## 2017-04-05 NOTE — Assessment & Plan Note (Addendum)
History of pulmonary embolus in the past of unclear etiology. Remains on Xarelto  oral and regulation. He apparently was worked up for hypercoagulability and we are told he that had a lupus anticoagulant which is why he remains on oral and regulation.

## 2017-04-05 NOTE — Assessment & Plan Note (Signed)
History of hyperlipidemia on statin therapy.  We will recheck a lipid and liver profile today 

## 2017-04-05 NOTE — Patient Instructions (Signed)
Medication Instructions: Your physician recommends that you continue on your current medications as directed. Please refer to the Current Medication list given to you today.  Labwork: Your physician recommends that you return for a FASTING lipid profile: today   Testing/Procedures: Non-Cardiac CT Angiography (CTA) Chest/Aorta, is a special type of CT scan that uses a computer to produce multi-dimensional views of major blood vessels throughout the body. In CT angiography, a contrast material is injected through an IV to help visualize the blood vessels in 2 weeks.  Follow-Up: Your physician wants you to follow-up in: 1 year with Dr. Gwenlyn Found. You will receive a reminder letter in the mail two months in advance. If you don't receive a letter, please call our office to schedule the follow-up appointment.  If you need a refill on your cardiac medications before your next appointment, please call your pharmacy.

## 2017-04-05 NOTE — Assessment & Plan Note (Signed)
History of mild thoracic aortic rotation measuring 4.7 cm a CT angiogram September 2018. Will repeat CTA of his chest to measure progression.

## 2017-04-05 NOTE — Assessment & Plan Note (Signed)
History of essential hypertension blood pressure measured at 142/78. He is on metoprolol, ramipril and hydrochlorothiazide. Continue current meds at current dosing

## 2017-04-05 NOTE — Progress Notes (Signed)
04/05/2017 [name]   [birth year]  [id number]  Primary Physician [name], MD Primary Cardiologist: [name] MD [name], [place]  HPI:  [name] is a 71 y.o. 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 I last saw him 04/05/16.Marland Kitchen He has a history of remote tobacco abuse, having smoked cigars in the past, treated hypertension, dyslipidemia. He has never had a heart attack or stroke. He has had bladder and prostate cancer, and he has had prostate surgery. He is followed by Dr. Algis Greenhouse and has been evaluated by Dr. Liam Rogers in the past with a Myoview stress test that was normal and an event monitor that showed PVCs for palpitations. He does drink 2-4 cups of coffee a day and has had palpitations since he was a teenager. He saw [name] complaining of right-sided chest pain and shortness of breath. He had a CT scan done April 19, 2012 that showed 2 small subsegmental right-sided pulmonary emboli of unclear origin. There is no evidence of DVT. He was placed on Xarelto. Dr. Beryle Beams did a hypercoagulable workup which apparently, according to the patient, was positive for lupus anticoagulant. Since I saw him he has remained asymptomatic. We have been following his small thoracic aortic aneurysm which a year ago measured 4.7 cm.   Current Meds  Medication Sig  . ALPRAZolam (XANAX) 0.5 MG tablet Take 0.5 mg by mouth 3 (three) times daily as needed.   Marland Kitchen co-enzyme Q-10 30 MG capsule Take 30 mg by mouth every evening.   Marland Kitchen CRESTOR 5 MG tablet TAKE 1 TABLET BY MOUTH EVERY DAY (Patient taking differently: TAKE 1 TABLET BY MOUTH EVERY DAY-- takes in am)  . fish oil-omega-3 fatty acids 1000 MG capsule Take 1 g by mouth daily.   . fluconazole (DIFLUCAN) 100 MG tablet Take 1 tablet (100 mg total) by mouth daily. Take 200 mg day 1 only  .  hydrochlorothiazide (HYDRODIURIL) 25 MG tablet Take 25 mg by mouth daily.  . Hypertonic Nasal Wash (SINUS RINSE BOTTLE KIT) PACK Place into the nose at bedtime.    . metoprolol tartrate (LOPRESSOR) 100 MG tablet TAKE 1 TABLET BY MOUTH TWICE A DAY  . Multiple Vitamin (MULTIVITAMIN) tablet Take 1 tablet by mouth daily.    Marland Kitchen nystatin cream (MYCOSTATIN) Apply 1 application topically 2 (two) times daily.  Marland Kitchen oxyCODONE (OXY IR/ROXICODONE) 5 MG immediate release tablet Take 1-2 tablets (5-10 mg total) by mouth every 4 (four) hours as needed for moderate pain, severe pain or breakthrough pain.  Marland Kitchen oxymetazoline (AFRIN) 0.05 % nasal spray Place 2 sprays into the nose 2 (two) times daily.    . pantoprazole (PROTONIX) 40 MG tablet Take 1 tablet (40 mg total) by mouth daily.  . polyethylene glycol powder (GLYCOLAX/MIRALAX) powder Take 1 Container by mouth daily. 1  1/2 capful daily  . Psyllium (METAMUCIL FIBER PO) Take by mouth. 2 tablespoons mixed with miralax daily  . ramipril (ALTACE) 10 MG capsule Take 1 capsule (10 mg total) by mouth 2 (two) times daily.  . Rivaroxaban (XARELTO) 20 MG TABS Take 20 mg by mouth daily.  Marland Kitchen rOPINIRole (REQUIP) 1 MG tablet Take 2 mg by mouth at bedtime.   . sertraline (ZOLOFT) 100 MG tablet Take 100 mg by mouth daily.  . sildenafil (VIAGRA) 50 MG tablet Take 100 mg by mouth daily as needed.  Allergies  Allergen Reactions  . Aspirin Other (See Comments)    REACTION: gi upset with higher dose  . Penicillins Itching    Social History   Social History  . Marital status: Married    Spouse name: N/A  . Number of children: 2  . Years of education: N/A   Occupational History  . Art gallery manager   . FIELD AP[employer]S [employer]   Social History Main Topics  . Smoking status: Current Some Day Smoker    Years: 51.00    Types: Cigars  . Smokeless tobacco: Never Used     Comment: 5 cigars /week--/  smoked cigarettes for 10 years quit 1990's  . Alcohol use 8.4  oz/week    14 Glasses of wine per week     Comment: 2 glasses wine/day, cut out wine , drinking beer  . Drug use: No  . Sexual activity: Not on file   Other Topics Concern  . Not on file   Social History Narrative   Gets regular exercise.   Daily caffeine- 2 cups daily.   Education: [employer]     Review of Systems: General: negative for chills, fever, night sweats or weight changes.  Cardiovascular: negative for chest pain, dyspnea on exertion, edema, orthopnea, palpitations, paroxysmal nocturnal dyspnea or shortness of breath Dermatological: negative for rash Respiratory: negative for cough or wheezing Urologic: negative for hematuria Abdominal: negative for nausea, vomiting, diarrhea, bright red blood per rectum, melena, or hematemesis Neurologic: negative for visual changes, syncope, or dizziness All other systems reviewed and are otherwise negative except as noted above.    Blood pressure (!) 142/78, pulse 65, height 5' 11"  (1.803 m), weight 231 lb 9.6 oz (105.1 kg).  General appearance: alert and no distress Neck: no adenopathy, no carotid bruit, no JVD, supple, symmetrical, trachea midline and thyroid not enlarged, symmetric, no tenderness/mass/nodules Lungs: clear to auscultation bilaterally Heart: regular rate and rhythm, S1, S2 normal, no murmur, click, rub or gallop Extremities: extremities normal, atraumatic, no cyanosis or edema  EKG sinus rhythm 65 without ST or T-wave changes. I personally reviewed this EKG.  ASSESSMENT AND PLAN:   HYPERLIPIDEMIA History of hyperlipidemia on statin therapy. We will recheck a lipid and liver profile today  Essential hypertension History of essential hypertension blood pressure measured at 142/78. He is on metoprolol, ramipril and hydrochlorothiazide. Continue current meds at current dosing  PULMONARY EMBOLISM, HX OF History of pulmonary embolus in the past of unclear etiology. Remains on Xarelto  oral and regulation. He apparently  was worked up for hypercoagulability and we are told he that had a lupus anticoagulant which is why he remains on oral and regulation.  Thoracic aortic aneurysm [facility]) History of mild thoracic aortic rotation measuring 4.7 cm a CT angiogram September 2018. Will repeat CTA of his chest to measure progression.      [name] MD FACP,FACC,FAHA, [facility] 04/05/2017 10:28 AM

## 2017-04-05 NOTE — Addendum Note (Signed)
Addended by: Therisa Doyne on: 04/05/2017 11:02 AM   Modules accepted: Orders

## 2017-04-07 ENCOUNTER — Other Ambulatory Visit: Payer: Self-pay | Admitting: Surgery

## 2017-04-10 ENCOUNTER — Ambulatory Visit (INDEPENDENT_AMBULATORY_CARE_PROVIDER_SITE_OTHER): Payer: Medicare Other | Admitting: Internal Medicine

## 2017-04-10 ENCOUNTER — Encounter: Payer: Self-pay | Admitting: Internal Medicine

## 2017-04-10 VITALS — BP 120/73 | HR 66 | Temp 98.1°F | Ht 71.0 in | Wt 229.0 lb

## 2017-04-10 DIAGNOSIS — B3781 Candidal esophagitis: Secondary | ICD-10-CM

## 2017-04-10 NOTE — Progress Notes (Signed)
[facility]      Reason for Consult: recurrent esophageal Candiasis    Referring Physician: [name]    Patient ID: [name], male    DOB: 20-Jun-1946, 71 y.o.   MRN: [id number]  HPI:   He comes in with recent diagnosis by EGD of Candida esophagitis. EGD noted the appearance of patchy areas in the upper 1/3 of the esophagus c/w Candidiasis.  The patient relates a history of this in the past multiple times.  He also has a long history of symptoms of fatigue, aches and general malaise that has been persistent for 40 years.  No partiuclar diagnosis has been found.  No weight loss.  He did get Diflucan from Dr. Carlean Purl.  Here for evaluation of risk for recurrence. Recent HIV negative.  No record of HCV testing in Epic.  He is concerned that Candida could be in his blood.   Previous record reviewed from Epic including the EGD pictures and report with Candida.    Past Medical History:  Diagnosis Date  . Antiphospholipid antibody syndrome ([place]) 05/22/2012   Found subsequent to recurrent PE 9/13  . Arthritis   . Chronic anal fissure    w/ recurrence's  . Chronic anxiety   . Depression   . ED (erectile dysfunction)   . Fatty liver   . GERD (gastroesophageal reflux disease)   . Heart murmur   . Hiatal hernia   . History of adenomatous polyp of colon    2005  . History of bladder cancer urologist-  dr grapey   papillary TCC  s/p  turbt  . History of DVT (deep vein thrombosis)    post prostate surgery 2010  right LLE  . History of esophageal stricture    w/ dilation  . History of gastric polyp    benign 2010  . History of prostate cancer urologist- dr grapey/  oncology-  dr Alen Blew   Stage  T1c , Gleason 3+3;   s/p  prostatectomy 03-08-210//  per pt currently PSA nondetectable   . History of pulmonary embolus (PE)    post prostate surgery 2010  and 2013 x2 right side per CT scan (found to have lupus anticoaglated )  . History of TIA (transient  ischemic attack)    07/ 2012 noted on scans remote probable tia's in  age 72's  . History of TMJ syndrome   . HTN (hypertension)   . Hyperlipidemia   . Lupus anticoagulant positive    found 2013 when pt had PE  . Moderate aortic regurgitation    with no  stenosis   . OSA on CPAP    per study severe osa 12-14-2010  . RLS (restless legs syndrome)   . RLS (restless legs syndrome)   . Sleep apnea   . Symptomatic PVCs cardiologist-  dr berry   intermittant  . Wears glasses     Prior to Admission medications   Medication Sig Start Date End Date Taking? Authorizing Provider  ALPRAZolam Duanne Moron) 0.5 MG tablet Take 0.5 mg by mouth 3 (three) times daily as needed.  03/26/14  Yes [provider]  co-enzyme Q-10 30 MG capsule Take 30 mg by mouth every evening.    Yes [provider]  CRESTOR 5 MG tablet TAKE 1 TABLET BY MOUTH EVERY DAY Patient taking differently: TAKE 1 TABLET BY MOUTH EVERY DAY-- takes in am 02/23/15  Yes [name], MD  fish oil-omega-3 fatty acids 1000 MG  capsule Take 1 g by mouth daily.    Yes [provider]  fluconazole (DIFLUCAN) 100 MG tablet Take 1 tablet (100 mg total) by mouth daily. Take 200 mg day 1 only 03/10/17  Yes Gatha Mayer, MD  hydrochlorothiazide (HYDRODIURIL) 25 MG tablet Take 25 mg by mouth daily.   Yes [provider]  Hypertonic Nasal Wash (SINUS RINSE BOTTLE KIT) PACK Place into the nose at bedtime.     Yes [provider]  metoprolol tartrate (LOPRESSOR) 100 MG tablet TAKE 1 TABLET BY MOUTH TWICE A DAY 03/29/17  Yes [name], MD  Multiple Vitamin (MULTIVITAMIN) tablet Take 1 tablet by mouth daily.     Yes [provider]  nystatin cream (MYCOSTATIN) Apply 1 application topically 2 (two) times daily. 02/20/17  Yes Gatha Mayer, MD  oxyCODONE (OXY IR/ROXICODONE) 5 MG immediate release tablet Take 1-2 tablets (5-10 mg total) by mouth every 4 (four) hours as needed for moderate pain,  severe pain or breakthrough pain. 01/14/16  Yes Michael Boston, MD  oxymetazoline (AFRIN) 0.05 % nasal spray Place 2 sprays into the nose 2 (two) times daily.     Yes [provider]  pantoprazole (PROTONIX) 40 MG tablet Take 1 tablet (40 mg total) by mouth daily. 02/20/17  Yes Gatha Mayer, MD  polyethylene glycol powder (GLYCOLAX/MIRALAX) powder Take 1 Container by mouth daily. 1  1/2 capful daily   Yes [provider]  Psyllium (METAMUCIL FIBER PO) Take by mouth. 2 tablespoons mixed with miralax daily   Yes [provider]  ramipril (ALTACE) 10 MG capsule Take 1 capsule (10 mg total) by mouth 2 (two) times daily. 06/13/16  Yes [name], MD  Rivaroxaban (XARELTO) 20 MG TABS Take 20 mg by mouth daily.   Yes [provider]  rOPINIRole (REQUIP) 1 MG tablet Take 2 mg by mouth at bedtime.  08/27/12  Yes [provider]  sertraline (ZOLOFT) 100 MG tablet Take 100 mg by mouth daily.   Yes [provider]  sildenafil (VIAGRA) 50 MG tablet Take 100 mg by mouth daily as needed.    Yes [provider]    Allergies  Allergen Reactions  . Aspirin Other (See Comments)    REACTION: gi upset with higher dose  . Penicillins Itching    Social History  Substance Use Topics  . Smoking status: Current Some Day Smoker    Years: 51.00    Types: Cigars  . Smokeless tobacco: Never Used     Comment: 5 cigars /week--/  smoked cigarettes for 10 years quit 1990's  . Alcohol use 8.4 oz/week    14 Glasses of wine per week     Comment: 2 glasses wine/day, cut out wine , drinking beer    Family History  Problem Relation Age of Onset  . Hypertension Mother   . Hypertension Father   . Stroke Father   . Colon polyps Father   . Colon cancer Father        questionable  . Diabetes Maternal Grandmother   . Heart disease Maternal Grandmother   . Colon polyps Brother   . Crohn's disease Brother   . Cirrhosis Unknown        Great Uncle     Review of Systems  Constitutional: positive for sweats, fatigue and malaise or negative for anorexia and weight loss Ears, nose, mouth, throat, and face: positive for earaches Gastrointestinal: negative for nausea and diarrhea Integument/breast: negative for  rash Musculoskeletal: positive for myalgias and arthralgias, negative for arthritis All other systems reviewed and are negative    Constitutional: in no apparent distress and alert  Vitals:   04/10/17 1357  BP: 120/73  Pulse: 66  Temp: 98.1 F (36.7 C)   EYES: anicteric ENMT: no current thrush; left ear with some fluid, no wax, no erythema; right ear with packed wax Cardiovascular: Cor RRR and No murmurs Respiratory: CTA B; normal respiratory effort GI: Bowel sounds are normal, liver is not enlarged, spleen is not enlarged Musculoskeletal: no pedal edema noted Skin: negatives: no rash Hematologic: no cervical lad  Labs: Lab Results  Component Value Date   WBC 10.3 07/14/2016   HGB 14.4 07/14/2016   HCT 42.5 07/14/2016   MCV 89.1 07/14/2016   PLT 139 (L) 07/14/2016    Lab Results  Component Value Date   CREATININE 1.0 07/14/2016   BUN 13.0 07/14/2016   NA 140 07/14/2016   K 4.2 07/14/2016   CL 103 04/12/2016   CO2 28 07/14/2016    Lab Results  Component Value Date   ALT 28 07/14/2016   AST 23 07/14/2016   ALKPHOS 78 07/14/2016   BILITOT 1.19 07/14/2016   INR 1.06 08/15/2014     Assessment: Recurrent esophageal Candidiasis.  I discussed possibilities with the patient.  Causes of thrush/Candida include immunodeficiency, medications or toxins.  He is HIV negative and not on any biologics or other causes of immunosuppression to be an acquired issues.  He is taking Flonase that can contribute to Candida susceptibility.   He smokes which also can contribute to inflammation of the throat, esophagus.  He also did receive antibiotics for sinusitis earlier this year  He also asked about it being in his blood and I  discussed the signs of blood pathogens and when to be concerned (high fever, rigors,   Plan: 1) stop Flonase if possible or drink liquid after spraying 2) stop smoking 3) avoid antibiotics unless indicated 4) Hepatitis C testing if not already done.  I will defer to [name] as I suspect this has been done.    Thanks for the consultation.

## 2017-04-13 DIAGNOSIS — I712 Thoracic aortic aneurysm, without rupture: Secondary | ICD-10-CM | POA: Diagnosis not present

## 2017-04-13 DIAGNOSIS — E785 Hyperlipidemia, unspecified: Secondary | ICD-10-CM | POA: Diagnosis not present

## 2017-04-13 DIAGNOSIS — I10 Essential (primary) hypertension: Secondary | ICD-10-CM | POA: Diagnosis not present

## 2017-04-13 DIAGNOSIS — Z86718 Personal history of other venous thrombosis and embolism: Secondary | ICD-10-CM | POA: Diagnosis not present

## 2017-04-13 LAB — BASIC METABOLIC PANEL
BUN / CREAT RATIO: 12 (ref 10–24)
BUN: 13 mg/dL (ref 8–27)
CO2: 29 mmol/L (ref 20–29)
CREATININE: 1.05 mg/dL (ref 0.76–1.27)
Calcium: 9.5 mg/dL (ref 8.6–10.2)
Chloride: 99 mmol/L (ref 96–106)
GFR, EST AFRICAN AMERICAN: 84 mL/min/{1.73_m2} (ref >59)
GFR, EST NON AFRICAN AMERICAN: 73 mL/min/{1.73_m2} (ref >59)
GLUCOSE: 100 mg/dL — AB (ref 65–99)
Potassium: 3.8 mmol/L (ref 3.5–5.2)
SODIUM: 141 mmol/L (ref 134–144)

## 2017-04-19 ENCOUNTER — Ambulatory Visit (INDEPENDENT_AMBULATORY_CARE_PROVIDER_SITE_OTHER)
Admission: RE | Admit: 2017-04-19 | Discharge: 2017-04-19 | Disposition: A | Discharge status: Home / Self Care | Payer: Medicare Other | Source: Ambulatory Visit | Attending: Cardiovascular Disease | Admitting: Cardiovascular Disease

## 2017-04-19 DIAGNOSIS — I712 Thoracic aortic aneurysm, without rupture, unspecified: Secondary | ICD-10-CM

## 2017-04-19 MED ORDER — IOPAMIDOL (ISOVUE-370) INJECTION 76%
100.0000 mL | Freq: Once | INTRAVENOUS | Status: AC | PRN
  Administered 2017-04-19: 100 mL via INTRAVENOUS

## 2017-04-23 ENCOUNTER — Encounter: Payer: Self-pay | Admitting: Internal Medicine

## 2017-04-29 DIAGNOSIS — G4733 Obstructive sleep apnea (adult) (pediatric): Secondary | ICD-10-CM | POA: Diagnosis not present

## 2017-04-30 ENCOUNTER — Other Ambulatory Visit: Payer: Self-pay | Admitting: Cardiovascular Disease

## 2017-05-01 NOTE — Telephone Encounter (Signed)
Rx(s) sent to pharmacy electronically.  

## 2017-05-03 ENCOUNTER — Encounter: Payer: Self-pay | Admitting: Internal Medicine

## 2017-05-03 ENCOUNTER — Ambulatory Visit (INDEPENDENT_AMBULATORY_CARE_PROVIDER_SITE_OTHER): Payer: Medicare Other | Admitting: Internal Medicine

## 2017-05-03 VITALS — BP 130/72 | HR 72 | Ht 71.0 in | Wt 233.6 lb

## 2017-05-03 DIAGNOSIS — R161 Splenomegaly, not elsewhere classified: Secondary | ICD-10-CM

## 2017-05-03 DIAGNOSIS — B3781 Candidal esophagitis: Secondary | ICD-10-CM | POA: Diagnosis not present

## 2017-05-03 DIAGNOSIS — K6289 Other specified diseases of anus and rectum: Secondary | ICD-10-CM

## 2017-05-03 DIAGNOSIS — R109 Unspecified abdominal pain: Secondary | ICD-10-CM

## 2017-05-03 MED ORDER — DILTIAZEM GEL 2 %: CUTANEOUS | 5 refills | Status: DC

## 2017-05-03 NOTE — Progress Notes (Signed)
[name] 71 y.o. 1946-06-28 [id number]  Assessment & Plan:   Encounter Diagnoses  Name Primary?  . Splenomegaly Yes  . Left flank pain   . Esophageal candidiasis (HCC) - recurrent   . Rectal pain     So far with respect to the GI tract nothing significant going as a cause of his pain,  but splenomegaly could be causing his pain. Whether or not that has a relationship to his history of recurrent candida esophagitis is not clear either but it could be. HIV is negative.  I don't see any significant lab abnormalities regarding his immune system at least on blood counts etc. I wonder if prior to treatment for his antiphospholipid antibody syndrome that he didn't have some splenic infarcts etc. May be that has changed venous drainage of the spleen and cause enlargement? He does not seem to have systemic lupus. Once the CT is back we will have a better idea about true splenomegaly or not and whether or not to pursue other testing.   He may have a recurrent anal fissure, or just some spasm in the rectum. I will treat that with diltiazem 2% gel twice a day which has worked in the past.  I appreciate the opportunity to care for this patient. CC: Andrew Pretty, MD Dr. Zola Ford   Subjective:   Chief Complaint:Left upper quadrant pain enlarged spleen rectal pain  HPI [name] he is here for follow-up of a left upper quadrant and left flank pain. He's had a left sided pain in the abdomen then I haven't been able to figure out, GI workup is been negative. As part of a follow-up of a thoracic aneurysm he had a CT of the chest that suggested his spleen was enlarged but they weren't sure because he was not in the windows of the chest CT completely. I had communicated with him through my chart in suggestive balloon to get an abdominal pelvic CT but we have not ordered that yet. He saw Dr. Bradd Ford of infectious disease because of his history of recurrent candida esophagitis. Dr. Bradd Ford  didn't come up with anything with respect to that. The patient is concerned about a possible immune deficiency, his HIV and ANA are negative. He does say that at some point in the past he was told he had abnormal T lymphocytes. It sounds like it was during an ER visit at one point and I wonder if he didn't have atypical lymphocytes because they asked him if he had mono or had had that. He reports today that his grandfather had leukemia and his maternal aunt had lung cancer and leukemia. He is concerned about the splenomegaly and the possibility of a leukemia. He used to see Dr. Beryle Ford for his antiphospholipid antibody syndrome but now sees Dr. Alen Ford.  He also has intermittent problems with rectal pain every other day. Spasms pain, when questioned he remembers he has had this in the past and use diltiazem gel. No rectal bleeding. It can occur after a bowel movement and in between stools are when sitting a long time. He has a history of perianal abscess and surgery in 2017. Allergies  Allergen Reactions  . Aspirin Other (See Comments)    REACTION: gi upset with higher dose  . Penicillins Itching   Current Meds  Medication Sig  . ALPRAZolam (XANAX) 0.5 MG tablet Take 0.5 mg by mouth 3 (three) times daily as needed.   Marland Kitchen co-enzyme Q-10 30 MG capsule Take 30 mg by  mouth every evening.   Marland Kitchen CRESTOR 5 MG tablet TAKE 1 TABLET BY MOUTH EVERY DAY (Patient taking differently: TAKE 1 TABLET BY MOUTH EVERY DAY-- takes in am)  . fish oil-omega-3 fatty acids 1000 MG capsule Take 1 g by mouth daily.   . hydrochlorothiazide (HYDRODIURIL) 25 MG tablet Take 25 mg by mouth daily.  . Hypertonic Nasal Wash (SINUS RINSE BOTTLE KIT) PACK Place into the nose at bedtime.    . metoprolol tartrate (LOPRESSOR) 100 MG tablet Take 1 tablet (100 mg total) by mouth 2 (two) times daily.  . Multiple Vitamin (MULTIVITAMIN) tablet Take 1 tablet by mouth daily.    . NON FORMULARY   . nystatin cream (MYCOSTATIN) Apply 1  application topically 2 (two) times daily.  Marland Kitchen oxyCODONE (OXY IR/ROXICODONE) 5 MG immediate release tablet Take 1-2 tablets (5-10 mg total) by mouth every 4 (four) hours as needed for moderate pain, severe pain or breakthrough pain.  Marland Kitchen oxymetazoline (AFRIN) 0.05 % nasal spray Place 2 sprays into the nose 2 (two) times daily.    . pantoprazole (PROTONIX) 40 MG tablet Take 1 tablet (40 mg total) by mouth daily.  . polyethylene glycol powder (GLYCOLAX/MIRALAX) powder Take 1 Container by mouth daily. 1  1/2 capful daily  . Psyllium (METAMUCIL FIBER PO) Take by mouth. 2 tablespoons mixed with miralax daily  . ramipril (ALTACE) 10 MG capsule Take 1 capsule (10 mg total) by mouth 2 (two) times daily.  . Rivaroxaban (XARELTO) 20 MG TABS Take 20 mg by mouth daily.  Marland Kitchen rOPINIRole (REQUIP) 1 MG tablet Take 2 mg by mouth at bedtime.   . sertraline (ZOLOFT) 100 MG tablet Take 100 mg by mouth daily.  . sildenafil (VIAGRA) 50 MG tablet Take 100 mg by mouth daily as needed.    Past Medical History:  Diagnosis Date  . Antiphospholipid antibody syndrome ([place]) 05/22/2012   Found subsequent to recurrent PE 9/13  . Arthritis   . Chronic anal fissure    w/ recurrence's  . Chronic anxiety   . Depression   . ED (erectile dysfunction)   . Fatty liver   . GERD (gastroesophageal reflux disease)   . Heart murmur   . Hiatal hernia   . History of adenomatous polyp of colon    2005  . History of bladder cancer urologist-  dr Ford   papillary TCC  s/p  turbt  . History of DVT (deep vein thrombosis)    post prostate surgery 2010  right LLE  . History of esophageal stricture    w/ dilation  . History of gastric polyp    benign 2010  . History of prostate cancer urologist- dr Ford/  oncology-  dr [name]   Stage  T1c , Gleason 3+3;   s/p  prostatectomy 03-08-210//  per pt currently PSA nondetectable   . History of pulmonary embolus (PE)    post prostate surgery 2010  and 2013 x2 right side per CT scan (found to  have lupus anticoaglated )  . History of TIA (transient ischemic attack)    07/ 2012 noted on scans remote probable tia's in  age 20's  . History of TMJ syndrome   . HTN (hypertension)   . Hyperlipidemia   . Lupus anticoagulant positive    found 2013 when pt had PE  . Moderate aortic regurgitation    with no  stenosis   . OSA on CPAP    per study severe osa 12-14-2010  . RLS (restless legs  syndrome)   . RLS (restless legs syndrome)   . Sleep apnea   . Symptomatic PVCs cardiologist-  dr berry   intermittant  . Wears glasses    Past Surgical History:  Procedure Laterality Date  . APPENDECTOMY  age 19   and Left Inguinal Hernia Repair  . CARDIOVASCULAR STRESS TEST  06-30-2011   dr berry   normal nuclear study/  normal LV function and wall motion, ef 56%  . COLONOSCOPY  last one 10-19-2011  . CYSTO/ BLADDER BX/  RETROGRADE PYELOGRAM/  TRANSRECTAL PROSTATE BX'S  07-30-2008  . ESOPHAGOGASTRODUODENOSCOPY  last one 06-27-2011  . EVALUATION UNDER ANESTHESIA WITH FISTULECTOMY N/A 01/14/2016   Procedure: EXAM UNDER ANESTHESIA WITH REPAIR OF PERIRECTAL FISTULA  (LIFT);  Surgeon: Michael Boston, MD;  [facility];  Service: General;  Laterality: N/A;  . LAPAROSCOPIC CHOLECYSTECTOMY  05-25-2007   and Excision cyst back of neck  . ROBOT ASSISTED LAPAROSCOPIC RADICAL PROSTATECTOMY  10-20-2008  . STRABISMUS SURGERY Bilateral age 64  . TONSILLECTOMY  age 60  . TRANSTHORACIC ECHOCARDIOGRAM  03-23-2015   dr berry   grade 1 diastolic dysfunction, ef 31-59%/  mild AV thickened leaflets with no stenosis,  moderate AR/  mild ascending aorta ilatation3.9cm/  trivial MR and TR  . UPPER GASTROINTESTINAL ENDOSCOPY     Social History   Social History  . Marital status: Married    Spouse name: N/A  . Number of children: 2  . Years of education: N/A   Occupational History  . Art gallery manager   . [employer]S [employer]   Social History Main Topics  . Smoking status:  Current Some Day Smoker    Years: 51.00    Types: Cigars  . Smokeless tobacco: Never Used     Comment: 5 cigars /week--/  smoked cigarettes for 10 years quit 1990's  . Alcohol use 8.4 oz/week    14 Glasses of wine per week     Comment: 2 glasses wine/day, cut out wine , drinking beer  . Drug use: No   Social History Narrative   Gets regular exercise.   Daily caffeine- 2 cups daily.   Education: [employer]   family history includes Cirrhosis in his unknown relative; Colon cancer in his father; Colon polyps in his brother and father; Crohn's disease in his brother; Diabetes in his maternal grandmother; Heart disease in his maternal grandmother; Hypertension in his father and mother; Stroke in his father.   Review of Systems As per history of present illness. There is some stress in his family life as he has a mentally ill daughter-in-law and that interferes with visits with his grandchildren some and affects his wife negatively as well.  Objective:   Physical Exam BP 130/72   Pulse 72   Ht 5' 11"  (1.803 m)   Wt 233 lb 9.6 oz (106 kg)   BMI 32.58 kg/m   Abd soft NT no palpable splenomegaly   Rectal - perianal scar, indurated, mildly  tender posterior

## 2017-05-03 NOTE — Patient Instructions (Signed)
If you are age 69 or older, your body mass index should be between 23-30. Your Body mass index is 32.58 kg/m. If this is out of the aforementioned range listed, please consider follow up with your Primary Care Provider.  If you are age 46 or younger, your body mass index should be between 19-25. Your Body mass index is 32.58 kg/m. If this is out of the aformentioned range listed, please consider follow up with your Primary Care Provider.   We have sent the following medications to your pharmacy for you to pick up at your convenience:  Diltiazem gel [facility])  You have been scheduled for a CT scan of the abdomen and pelvis at [facility] ([address]---this is in the same building as Press photographer).   You are scheduled on Monday, September 24that 2:30pm. You should arrive 15 minutes prior to your appointment time for registration. Please follow the written instructions below on the day of your exam:  WARNING: IF YOU ARE ALLERGIC TO IODINE/X-RAY DYE, PLEASE NOTIFY RADIOLOGY IMMEDIATELY AT [phone]! YOU WILL BE GIVEN A 13 HOUR PREMEDICATION PREP.  1) Do not eat anything after 10:30am(4 hours prior to your test) Liquids are fine. 2) You have been given 2 bottles of oral contrast to drink. The solution may taste  better if refrigerated, but do NOT add ice or any other liquid to this solution. Shake  well before drinking.    Drink 1 bottle of contrast @ 12:30pm(2 hours prior to your exam)  Drink 1 bottle of contrast @ 1:30pm (1 hour prior to your exam)  You may take any medications as prescribed with a small amount of water except for the following: Metformin, Glucophage, Glucovance, Avandamet, Riomet, Fortamet, Actoplus Met, Janumet, Glumetza or Metaglip. The above medications must be held the day of the exam AND 48 hours after the exam.  The purpose of you drinking the oral contrast is to aid in the visualization of your intestinal tract. The contrast solution may  cause some diarrhea. Before your exam is started, you will be given a small amount of fluid to drink. Depending on your individual set of symptoms, you may also receive an intravenous injection of x-ray contrast/dye. Plan on being at [facility] for 30 minutes or longer, depending on the type of exam you are having performed.  This test typically takes 30-45 minutes to complete.  If you have any questions regarding your exam or if you need to reschedule, you may call the CT department at [phone] between the hours of 8:00 am and 5:00 pm, Monday-Friday.  ________________________________________________________________________

## 2017-05-08 ENCOUNTER — Other Ambulatory Visit: Payer: Self-pay

## 2017-05-08 ENCOUNTER — Ambulatory Visit (INDEPENDENT_AMBULATORY_CARE_PROVIDER_SITE_OTHER)
Admission: RE | Admit: 2017-05-08 | Discharge: 2017-05-08 | Disposition: A | Discharge status: Home / Self Care | Payer: Medicare Other | Source: Ambulatory Visit | Attending: Internal Medicine | Admitting: Internal Medicine

## 2017-05-08 DIAGNOSIS — I712 Thoracic aortic aneurysm, without rupture, unspecified: Secondary | ICD-10-CM

## 2017-05-08 DIAGNOSIS — R161 Splenomegaly, not elsewhere classified: Secondary | ICD-10-CM | POA: Diagnosis not present

## 2017-05-08 DIAGNOSIS — K760 Fatty (change of) liver, not elsewhere classified: Secondary | ICD-10-CM | POA: Diagnosis not present

## 2017-05-08 DIAGNOSIS — R109 Unspecified abdominal pain: Secondary | ICD-10-CM

## 2017-05-08 MED ORDER — IOPAMIDOL (ISOVUE-300) INJECTION 61%
100.0000 mL | Freq: Once | INTRAVENOUS | Status: AC | PRN
  Administered 2017-05-08: 100 mL via INTRAVENOUS

## 2017-05-10 ENCOUNTER — Encounter: Payer: Self-pay | Admitting: Surgery

## 2017-05-10 ENCOUNTER — Ambulatory Visit (INDEPENDENT_AMBULATORY_CARE_PROVIDER_SITE_OTHER): Payer: Medicare Other | Admitting: Surgery

## 2017-05-10 VITALS — BP 160/86 | HR 69 | Resp 19 | Ht 71.0 in | Wt 234.4 lb

## 2017-05-10 DIAGNOSIS — I712 Thoracic aortic aneurysm, without rupture, unspecified: Secondary | ICD-10-CM

## 2017-05-13 ENCOUNTER — Encounter: Payer: Self-pay | Admitting: Surgery

## 2017-05-13 ENCOUNTER — Encounter: Payer: Self-pay | Admitting: Internal Medicine

## 2017-05-13 ENCOUNTER — Encounter: Payer: Self-pay | Admitting: Cardiovascular Disease

## 2017-05-13 NOTE — Progress Notes (Signed)
HPI:  The patient returns for follow up of a fusiform ascending aortic aneurysm. He had a CT of the chest, abdomen and pelvis in 07/2015 due to some abdominal pain and this showed no sign of recurrent prostate or bladder cancer but did show a 4.4 cm fusiform ascending aortic aneurysm. He had a CTA of the chest in 2013 when he presented with shortness of breath with prior PE but there was no mention of his aorta. By my measurement the ascending aorta was 45 mm at that time. He had a follow up scan on 04/20/2016 which showed the ascending aorta to be slightly larger at 4.7 x 4.6 cm compared to 4.4 x 4.3 cm in 07/2015. He had an echo done on 04/29/2016 showing a trileaflet aortic valve with mild AI. The aortic root was 41 mm and the ascending aorta 44 mm. Since I saw him a year ago he has been feeling fairly well. He has had problems with recurrent esophageal candidiasis. He denies chest or back pain.  Current Outpatient Prescriptions  Medication Sig Dispense Refill  . ALPRAZolam (XANAX) 0.5 MG tablet Take 0.5 mg by mouth 3 (three) times daily as needed.     Marland Kitchen co-enzyme Q-10 30 MG capsule Take 30 mg by mouth every evening.     Marland Kitchen CRESTOR 5 MG tablet TAKE 1 TABLET BY MOUTH EVERY DAY (Patient taking differently: TAKE 1 TABLET BY MOUTH EVERY DAY-- takes in am) 30 tablet 8  . diltiazem 2 % GEL Apply per rectum twice daily 30 g 5  . fish oil-omega-3 fatty acids 1000 MG capsule Take 1 g by mouth daily.     . hydrochlorothiazide (HYDRODIURIL) 25 MG tablet Take 25 mg by mouth daily.    . Hypertonic Nasal Wash (SINUS RINSE BOTTLE KIT) PACK Place into the nose at bedtime.      . metoprolol tartrate (LOPRESSOR) 100 MG tablet Take 1 tablet (100 mg total) by mouth 2 (two) times daily. 60 tablet 11  . Multiple Vitamin (MULTIVITAMIN) tablet Take 1 tablet by mouth daily.      . NON FORMULARY     . nystatin cream (MYCOSTATIN) Apply 1 application topically 2 (two) times daily. 30 g 2  . oxyCODONE (OXY IR/ROXICODONE)  5 MG immediate release tablet Take 1-2 tablets (5-10 mg total) by mouth every 4 (four) hours as needed for moderate pain, severe pain or breakthrough pain. 40 tablet 0  . oxymetazoline (AFRIN) 0.05 % nasal spray Place 2 sprays into the nose 2 (two) times daily.      . pantoprazole (PROTONIX) 40 MG tablet Take 1 tablet (40 mg total) by mouth daily. 90 tablet 3  . polyethylene glycol powder (GLYCOLAX/MIRALAX) powder Take 1 Container by mouth daily. 1  1/2 capful daily    . Psyllium (METAMUCIL FIBER PO) Take by mouth. 2 tablespoons mixed with miralax daily    . ramipril (ALTACE) 10 MG capsule Take 1 capsule (10 mg total) by mouth 2 (two) times daily. 180 capsule 3  . Rivaroxaban (XARELTO) 20 MG TABS Take 20 mg by mouth daily.    Marland Kitchen rOPINIRole (REQUIP) 1 MG tablet Take 2 mg by mouth at bedtime.     . sertraline (ZOLOFT) 100 MG tablet Take 100 mg by mouth daily.    . sildenafil (VIAGRA) 50 MG tablet Take 100 mg by mouth daily as needed.      No current facility-administered medications for this visit.      Physical Exam:  BP (!) 160/86   Pulse 69   Resp 19   Ht _0  (1.803 m)   Wt 234 lb 6.4 oz (106.3 kg)   SpO2 94% Comment: on RA  BMI 32.69 kg/m  He looks well Cardiac exam shows a regular rate and rhythm with normal heart sounds and no murmurs.  Diagnostic Tests:  CT ANGIO CHEST AORTA W &/OR WO CONTRAST (Accession [id number]) (Order [id number])  Imaging  Date: 04/19/2017 Department: [facility] IMAGING [place] Released By: Sherlie Ban Authorizing: [name], MD  Exam Information   Status Exam Begun  Exam Ended   Final [99] 04/19/2017 9:34 AM 04/19/2017 9:43 AM  PACS Images   Show images for CT ANGIO CHEST AORTA W &/OR WO CONTRAST  Study Result   CLINICAL DATA:  Thoracic aortic aneurysm  EXAM: CT ANGIOGRAPHY CHEST WITH CONTRAST  TECHNIQUE: Multidetector CT imaging of the chest was performed using the standard protocol during bolus  administration of intravenous contrast. Multiplanar CT image reconstructions and MIPs were obtained to evaluate the vascular anatomy.  CONTRAST:  100 mL Isovue 370 nonionic  COMPARISON:  April 20, 2016  FINDINGS: Cardiovascular: Ascending thoracic aortic diameter measures 4.7 x 4.5 cm, stable. There is no appreciable thoracic aortic dissection. Visualized great vessels appear unremarkable. There are foci of coronary artery calcification. There are scattered foci of atherosclerotic calcification in the aorta. There is no appreciable pericardial effusion or thickening. There is no major vessel pulmonary embolus evident.  Mediastinum/Nodes: Thyroid appears unremarkable. There is no appreciable thoracic adenopathy. No esophageal lesions are evident.  Lungs/Pleura: There is no appreciable edema or consolidation. No pleural effusion or pleural thickening evident. A previously noted 3 mm nodular opacity in the posterior segment of the left lower lobe is seen on axial slice 84 series 5 and is stable. A 2 mm nodular opacity in the posterior segment right lower lobe is seen on axial slice 84 series 5 and stable. No new pulmonary nodular lesions are evident.  Upper Abdomen: There is hepatic steatosis. 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 Gallbladder is absent. Spleen is incompletely visualized but appears enlarged. Spleen measures 16.1 cm from anterior to posterior dimension.  Musculoskeletal: There are foci of degenerative change in the thoracic spine. No blastic or lytic bone lesions evident.  Review of the MIP images confirms the above findings.  IMPRESSION: 1. Ascending thoracic aortic diameter measures 4.7 x 4.5 cm, essentially stable. There is been no progression of dilatation in the at ascending aorta. No thoracic aortic dissection. There are foci of  atherosclerotic calcification in the aorta as well as foci of coronary artery calcification. Ascending thoracic aortic aneurysm. Recommend semi-annual imaging followup by CTA or MRA and referral to cardiothoracic surgery if not already obtained. This recommendation follows 2010 ACCF/AHA/AATS/ACR/ASA/SCA/SCAI/SIR/STS/SVM Guidelines for the Diagnosis and Management of Patients With Thoracic Aortic Disease. Circulation. 2010; 121: E100-F121.  2. Stable 2-3 mm nodular opacities in the lung bases. No new parenchymal lung nodular lesions. No edema or consolidation.  3.  No evident thoracic adenopathy.  4.  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  5.  Hepatic steatosis.  6. Evidence of splenomegaly. Spleen incompletely visualized on this study.  7.  Gallbladder absent.  Aortic aneurysm NOS (ICD10-I71.9).  Aortic Atherosclerosis (ICD10-I70.0).   Electronically Signed   By: Lowella Grip III M.D.   On: 04/19/2017 10:18  Impression:  He has a fusiform ascending aortic aneurysm with a trileaflet aortic valve. The aneurysm measures 4.7 x 4.5 cm and is stable compared to last year. This does not require surgical treatment at this time but will require continued follow up.  I reviewed the images with him and answered his questions. I stressed the importance of good BP control.   Plan:  I will see him in one year with a CTA of the chest    I spent 15 minutes performing this established patient evaluation and > 50% of this time was spent face to face counseling and coordinating the care of this patient's aortic aneurysm.    Gaye Pollack, MD [facility] [phone]

## 2017-05-13 NOTE — Progress Notes (Signed)
Normal spleen size My Chart messager   The spleen is normal on this (more accurate) exam.  So that is good news.  Some calcification in the main abdominal artery - aorta - not a surprise for 71 year-old man.  Ignore the constipation remark.  So overall good news though you still have the symptoms.  The recurrent Candida infections are a puzzle but nothing bad is turining up.  I will think about it some more but I would try not to worry.

## 2017-05-22 ENCOUNTER — Encounter: Payer: Self-pay | Admitting: Cardiovascular Disease

## 2017-05-22 ENCOUNTER — Encounter: Payer: Self-pay | Admitting: Internal Medicine

## 2017-05-24 ENCOUNTER — Telehealth: Payer: Self-pay | Admitting: Cardiovascular Disease

## 2017-05-24 NOTE — Telephone Encounter (Signed)
Hi [name]!   I will send a message to Dr. Gwenlyn Found to let him know you are requesting this. Are you having any new/worsening symptoms (chest pain, shortness of breath)? I can place the order and have our scheduler call you to set this up when I get the ok from Dr. Gwenlyn Found.  Thanks! Lovena Le, CMA ===View-only below this line===   ----- Message -----    From: [name]    Sent: 05/22/2017  9:36 AM EDT      To: Quay Burow, MD Subject: Visit Follow-Up Question  Hi. This is the second message I'm sending. I think there is a problem  With the system.  Dr Thane Edu didn't get my message either.  Dr. Cyndia Bent  said I don't need to take a CT every six months just every year.  He also  wants me to have an Echo  every year. I didn't get one this year  so I am requesting An appointment for one. Please  message me back or call me.   Thx [name]  [phone]

## 2017-05-29 DIAGNOSIS — G4733 Obstructive sleep apnea (adult) (pediatric): Secondary | ICD-10-CM | POA: Diagnosis not present

## 2017-05-30 ENCOUNTER — Encounter: Payer: Self-pay | Admitting: Internal Medicine

## 2017-07-08 ENCOUNTER — Other Ambulatory Visit: Payer: Self-pay | Admitting: Internal Medicine

## 2017-07-14 ENCOUNTER — Other Ambulatory Visit (HOSPITAL_BASED_OUTPATIENT_CLINIC_OR_DEPARTMENT_OTHER): Payer: Medicare Other

## 2017-07-14 DIAGNOSIS — Z8546 Personal history of malignant neoplasm of prostate: Secondary | ICD-10-CM | POA: Diagnosis not present

## 2017-07-14 DIAGNOSIS — Z8551 Personal history of malignant neoplasm of bladder: Secondary | ICD-10-CM

## 2017-07-14 DIAGNOSIS — C679 Malignant neoplasm of bladder, unspecified: Secondary | ICD-10-CM

## 2017-07-14 LAB — COMPREHENSIVE METABOLIC PANEL
ALBUMIN: 4.1 g/dL (ref 3.5–5.0)
ALK PHOS: 67 U/L (ref 40–150)
ALT: 57 U/L — AB (ref 0–55)
ANION GAP: 8 meq/L (ref 3–11)
AST: 35 U/L — AB (ref 5–34)
BUN: 16.8 mg/dL (ref 7.0–26.0)
CALCIUM: 9.4 mg/dL (ref 8.4–10.4)
CHLORIDE: 104 meq/L (ref 98–109)
CO2: 28 mEq/L (ref 22–29)
CREATININE: 1.1 mg/dL (ref 0.7–1.3)
EGFR: >60 mL/min/{1.73_m2} (ref >60)
Glucose: 104 mg/dl (ref 70–140)
Potassium: 3.8 mEq/L (ref 3.5–5.1)
Sodium: 140 mEq/L (ref 136–145)
Total Bilirubin: 0.6 mg/dL (ref 0.20–1.20)
Total Protein: 7.1 g/dL (ref 6.4–8.3)

## 2017-07-14 LAB — CBC WITH DIFFERENTIAL/PLATELET
BASO%: 0.2 % (ref 0.0–2.0)
Basophils Absolute: 0 10*3/uL (ref 0.0–0.1)
EOS%: 2.6 % (ref 0.0–7.0)
Eosinophils Absolute: 0.1 10*3/uL (ref 0.0–0.5)
HEMATOCRIT: 43.2 % (ref 38.4–49.9)
HEMOGLOBIN: 14.5 g/dL (ref 13.0–17.1)
LYMPH#: 1.8 10*3/uL (ref 0.9–3.3)
LYMPH%: 33.5 % (ref 14.0–49.0)
MCH: 30.8 pg (ref 27.2–33.4)
MCHC: 33.6 g/dL (ref 32.0–36.0)
MCV: 91.7 fL (ref 79.3–98.0)
MONO#: 0.4 10*3/uL (ref 0.1–0.9)
MONO%: 7.2 % (ref 0.0–14.0)
NEUT#: 3.1 10*3/uL (ref 1.5–6.5)
NEUT%: 56.5 % (ref 39.0–75.0)
PLATELETS: 154 10*3/uL (ref 140–400)
RBC: 4.71 10*6/uL (ref 4.20–5.82)
RDW: 14.2 % (ref 11.0–14.6)
WBC: 5.4 10*3/uL (ref 4.0–10.3)

## 2017-07-15 LAB — PSA: Prostate Specific Ag, Serum: <0.1 ng/mL (ref 0.0–4.0)

## 2017-07-21 ENCOUNTER — Telehealth: Payer: Self-pay | Admitting: Oncology

## 2017-07-21 ENCOUNTER — Ambulatory Visit: Payer: Medicare Other | Admitting: Oncology

## 2017-07-21 VITALS — BP 135/64 | HR 67 | Temp 97.7°F | Resp 18 | Ht 71.0 in | Wt 232.1 lb

## 2017-07-21 DIAGNOSIS — Z7901 Long term (current) use of anticoagulants: Secondary | ICD-10-CM | POA: Diagnosis not present

## 2017-07-21 DIAGNOSIS — Z86718 Personal history of other venous thrombosis and embolism: Secondary | ICD-10-CM | POA: Diagnosis not present

## 2017-07-21 DIAGNOSIS — Z8546 Personal history of malignant neoplasm of prostate: Secondary | ICD-10-CM | POA: Diagnosis not present

## 2017-07-21 NOTE — Telephone Encounter (Signed)
Gave avs and calendar for December 2019 °

## 2017-07-21 NOTE — Progress Notes (Signed)
Hematology and Oncology Follow Up Visit  [name] [id number] Mar 07, 1946 71 y.o. 07/21/2017 9:08 AM [name], MDPharr, Andrew Jew, MD   Principle Diagnosis: 71 year old gentleman diagnosed with prostate cancer in 2010. He had a Gleason score 3+3 = 6.    Prior Therapy: Status post prostatectomy done on 10/20/2008.  Current therapy: Observation and surveillance.   Interim History: [name] presents today for a follow-up visit. Since the last visit, he reports feeling well without any major changes.  He did have a CT scan of the abdomen and pelvis in September 2018 which showed normal spleen and hepatic steatosis.  He does not report any abdominal pain at this point or changes in his bowel habits.  He has not reported any thrombosis or bleeding complications. He denied any hematuria, dysuria or genitourinary complaints.  He continues to be active and attends activities of daily living.   He does not report any headaches or blurry vision double vision or syncope. Does not report any fevers chills or sweats. Does not report any chest pain, palpitation or orthopnea. Does not report any nausea, vomiting abdominal pain. Does not report any frequency urgency or hesitancy. He does not report any skeletal complaints. Rest of his review of systems unremarkable.  Medications: I have reviewed the patient's current medications.  Current Outpatient Medications  Medication Sig Dispense Refill  . ALPRAZolam (XANAX) 0.5 MG tablet Take 0.5 mg by mouth 3 (three) times daily as needed.     Marland Kitchen co-enzyme Q-10 30 MG capsule Take 30 mg by mouth every evening.     Marland Kitchen CRESTOR 5 MG tablet TAKE 1 TABLET BY MOUTH EVERY DAY (Patient taking differently: TAKE 1 TABLET BY MOUTH EVERY DAY-- takes in am) 30 tablet 8  . diltiazem 2 % GEL Apply per rectum twice daily 30 g 5  . fish oil-omega-3 fatty acids 1000 MG capsule Take 1 g by mouth daily.     . hydrochlorothiazide (HYDRODIURIL) 25 MG tablet Take 25 mg by mouth  daily.    . Hypertonic Nasal Wash (SINUS RINSE BOTTLE KIT) PACK Place into the nose at bedtime.      . metoprolol tartrate (LOPRESSOR) 100 MG tablet Take 1 tablet (100 mg total) by mouth 2 (two) times daily. 60 tablet 11  . Multiple Vitamin (MULTIVITAMIN) tablet Take 1 tablet by mouth daily.      . NON FORMULARY     . nystatin cream (MYCOSTATIN) Apply 1 application topically 2 (two) times daily. 30 g 2  . oxyCODONE (OXY IR/ROXICODONE) 5 MG immediate release tablet Take 1-2 tablets (5-10 mg total) by mouth every 4 (four) hours as needed for moderate pain, severe pain or breakthrough pain. 40 tablet 0  . oxymetazoline (AFRIN) 0.05 % nasal spray Place 2 sprays into the nose 2 (two) times daily.      . pantoprazole (PROTONIX) 40 MG tablet TAKE 1 TABLET BY MOUTH EVERY DAY 90 tablet 1  . polyethylene glycol powder (GLYCOLAX/MIRALAX) powder Take 1 Container by mouth daily. 1  1/2 capful daily    . Psyllium (METAMUCIL FIBER PO) Take by mouth. 2 tablespoons mixed with miralax daily    . ramipril (ALTACE) 10 MG capsule Take 1 capsule (10 mg total) by mouth 2 (two) times daily. 180 capsule 3  . Rivaroxaban (XARELTO) 20 MG TABS Take 20 mg by mouth daily.    Marland Kitchen rOPINIRole (REQUIP) 1 MG tablet Take 2 mg by mouth at bedtime.     . sertraline (ZOLOFT) 100 MG  tablet Take 100 mg by mouth daily.    . sildenafil (VIAGRA) 50 MG tablet Take 100 mg by mouth daily as needed.      No current facility-administered medications for this visit.      Allergies:  Allergies  Allergen Reactions  . Aspirin Other (See Comments)    REACTION: gi upset with higher dose  . Penicillins Itching    Past Medical History, Surgical history, Social history, and Family History were reviewed and updated.  Physical Exam: Blood pressure 135/64, pulse 67, temperature 97.7 F (36.5 C), temperature source Oral, resp. rate 18, height 5' 11" (1.803 m), weight 232 lb 1.6 oz (105.3 kg), SpO2 100 %. ECOG: 0 General appearance: Alert, awake  gentleman without distress. Head: Normocephalic, without obvious abnormality no oral ulcers or lesions. Neck: no adenopathy Lymph nodes: Cervical, supraclavicular, and axillary nodes normal. Heart:regular rate and rhythm, S1, S2 normal, no murmur, click, rub or gallop Lung:chest clear, no wheezing, rales, normal symmetric air entry.  Chest wall examination: No erythema, induration or rash. Abdomin: soft, non-tender, without masses or organomegaly no rebound or guarding. EXT:no erythema, induration, or nodules   Lab Results: Lab Results  Component Value Date   WBC 5.4 07/14/2017   HGB 14.5 07/14/2017   HCT 43.2 07/14/2017   MCV 91.7 07/14/2017   PLT 154 07/14/2017     Chemistry      Component Value Date/Time   NA 140 07/14/2017 0817   K 3.8 07/14/2017 0817   CL 99 04/13/2017 0920   CL 103 11/02/2012 0737   CO2 28 07/14/2017 0817   BUN 16.8 07/14/2017 0817   CREATININE 1.1 07/14/2017 0817      Component Value Date/Time   CALCIUM 9.4 07/14/2017 0817   ALKPHOS 67 07/14/2017 0817   AST 35 (H) 07/14/2017 0817   ALT 57 (H) 07/14/2017 0817   BILITOT 0.60 07/14/2017 0817       Results for [name] (MRN [id number]) as of 07/21/2017 09:01  Ref. Range 07/14/2016 08:34 07/14/2017 08:17  Prostate Specific Ag, Serum Latest Ref Range: 0.0 - 4.0 ng/mL <0.1 <0.1     Impression and Plan:   71 year old gentleman with the following issues:  1. Prostate cancer diagnosed in February 2010. It is Gleason score 3+3 = 6. He is status post robotic prostatectomy done on 10/20/2008.   PSA on July 14, 2017 continues to be undetectable without any evidence to suggest recurrent disease.  2. History of superficial bladder tumor status post TURBT and continues to follow with urology regarding this issue.  3. Flank pain and right-sided chest pain: His CT scan obtained in September 2018 was reviewed and showed no abnormalities.  4. History of DVT: He remains on Xarelto.  5.  Follow-up: In 1 year or sooner if needed to.    Andrew Button, MD 12/7/20189:08 AM

## 2017-08-03 DIAGNOSIS — K140 Glossitis: Secondary | ICD-10-CM | POA: Diagnosis not present

## 2017-08-03 DIAGNOSIS — H6123 Impacted cerumen, bilateral: Secondary | ICD-10-CM | POA: Diagnosis not present

## 2017-08-21 ENCOUNTER — Other Ambulatory Visit: Payer: Self-pay | Admitting: Cardiovascular Disease

## 2017-08-21 DIAGNOSIS — G4733 Obstructive sleep apnea (adult) (pediatric): Secondary | ICD-10-CM | POA: Diagnosis not present

## 2017-09-11 DIAGNOSIS — G4733 Obstructive sleep apnea (adult) (pediatric): Secondary | ICD-10-CM | POA: Diagnosis not present

## 2017-10-09 DIAGNOSIS — E7800 Pure hypercholesterolemia, unspecified: Secondary | ICD-10-CM | POA: Diagnosis not present

## 2017-10-09 DIAGNOSIS — I10 Essential (primary) hypertension: Secondary | ICD-10-CM | POA: Diagnosis not present

## 2017-10-09 DIAGNOSIS — Z125 Encounter for screening for malignant neoplasm of prostate: Secondary | ICD-10-CM | POA: Diagnosis not present

## 2017-10-12 DIAGNOSIS — R760 Raised antibody titer: Secondary | ICD-10-CM | POA: Diagnosis not present

## 2017-10-12 DIAGNOSIS — D6859 Other primary thrombophilia: Secondary | ICD-10-CM | POA: Diagnosis not present

## 2017-10-12 DIAGNOSIS — Z0000 Encounter for general adult medical examination without abnormal findings: Secondary | ICD-10-CM | POA: Diagnosis not present

## 2017-10-12 DIAGNOSIS — R740 Nonspecific elevation of levels of transaminase and lactic acid dehydrogenase [LDH]: Secondary | ICD-10-CM | POA: Diagnosis not present

## 2017-10-12 DIAGNOSIS — E7800 Pure hypercholesterolemia, unspecified: Secondary | ICD-10-CM | POA: Diagnosis not present

## 2017-11-06 ENCOUNTER — Other Ambulatory Visit: Payer: Medicare Other

## 2017-11-22 DIAGNOSIS — G4733 Obstructive sleep apnea (adult) (pediatric): Secondary | ICD-10-CM | POA: Diagnosis not present

## 2017-11-27 DIAGNOSIS — Z8551 Personal history of malignant neoplasm of bladder: Secondary | ICD-10-CM | POA: Diagnosis not present

## 2017-11-27 DIAGNOSIS — R300 Dysuria: Secondary | ICD-10-CM | POA: Diagnosis not present

## 2017-11-27 DIAGNOSIS — R102 Pelvic and perineal pain: Secondary | ICD-10-CM | POA: Diagnosis not present

## 2017-12-25 DIAGNOSIS — R102 Pelvic and perineal pain: Secondary | ICD-10-CM | POA: Diagnosis not present

## 2017-12-25 DIAGNOSIS — Z8551 Personal history of malignant neoplasm of bladder: Secondary | ICD-10-CM | POA: Diagnosis not present

## 2018-01-02 DIAGNOSIS — H2511 Age-related nuclear cataract, right eye: Secondary | ICD-10-CM | POA: Diagnosis not present

## 2018-01-02 DIAGNOSIS — I10 Essential (primary) hypertension: Secondary | ICD-10-CM | POA: Diagnosis not present

## 2018-01-02 DIAGNOSIS — H04123 Dry eye syndrome of bilateral lacrimal glands: Secondary | ICD-10-CM | POA: Diagnosis not present

## 2018-01-02 DIAGNOSIS — H2512 Age-related nuclear cataract, left eye: Secondary | ICD-10-CM | POA: Diagnosis not present

## 2018-01-16 DIAGNOSIS — E7800 Pure hypercholesterolemia, unspecified: Secondary | ICD-10-CM | POA: Diagnosis not present

## 2018-01-16 DIAGNOSIS — Z7901 Long term (current) use of anticoagulants: Secondary | ICD-10-CM | POA: Diagnosis not present

## 2018-01-16 DIAGNOSIS — R61 Generalized hyperhidrosis: Secondary | ICD-10-CM | POA: Diagnosis not present

## 2018-01-16 DIAGNOSIS — R760 Raised antibody titer: Secondary | ICD-10-CM | POA: Diagnosis not present

## 2018-01-16 DIAGNOSIS — R740 Nonspecific elevation of levels of transaminase and lactic acid dehydrogenase [LDH]: Secondary | ICD-10-CM | POA: Diagnosis not present

## 2018-02-09 ENCOUNTER — Other Ambulatory Visit: Payer: Self-pay | Admitting: Cardiovascular Disease

## 2018-02-23 DIAGNOSIS — G4733 Obstructive sleep apnea (adult) (pediatric): Secondary | ICD-10-CM | POA: Diagnosis not present

## 2018-03-02 ENCOUNTER — Other Ambulatory Visit: Payer: Self-pay | Admitting: Cardiovascular Disease

## 2018-03-02 NOTE — Telephone Encounter (Signed)
Rx sent to pharmacy   

## 2018-03-30 DIAGNOSIS — Z1283 Encounter for screening for malignant neoplasm of skin: Secondary | ICD-10-CM | POA: Diagnosis not present

## 2018-03-30 DIAGNOSIS — L821 Other seborrheic keratosis: Secondary | ICD-10-CM | POA: Diagnosis not present

## 2018-03-30 DIAGNOSIS — L218 Other seborrheic dermatitis: Secondary | ICD-10-CM | POA: Diagnosis not present

## 2018-04-18 ENCOUNTER — Other Ambulatory Visit: Payer: Medicare Other | Admitting: *Deleted

## 2018-04-18 ENCOUNTER — Telehealth: Payer: Self-pay

## 2018-04-18 DIAGNOSIS — I712 Thoracic aortic aneurysm, without rupture, unspecified: Secondary | ICD-10-CM

## 2018-04-18 NOTE — Telephone Encounter (Signed)
Triage call received from [facility] @ [place] CT @ [facility] Pt will need a bmet prior to having his CT on 9//5/19 ordered by Dr.Berry. Order for bmet placed in [place] and lab appt scheduled at [facility] today. Erline Levine will f/u with the pt directly.

## 2018-04-19 ENCOUNTER — Inpatient Hospital Stay: Admission: RE | Admit: 2018-04-19 | Payer: Medicare Other | Source: Ambulatory Visit

## 2018-04-19 LAB — BASIC METABOLIC PANEL
BUN/Creatinine Ratio: 12 (ref 10–24)
BUN: 12 mg/dL (ref 8–27)
CO2: 26 mmol/L (ref 20–29)
CREATININE: 1.01 mg/dL (ref 0.76–1.27)
Calcium: 9.7 mg/dL (ref 8.6–10.2)
Chloride: 96 mmol/L (ref 96–106)
GFR calc Af Amer: 87 mL/min/{1.73_m2} (ref >59)
GFR, EST NON AFRICAN AMERICAN: 76 mL/min/{1.73_m2} (ref >59)
Glucose: 83 mg/dL (ref 65–99)
Potassium: 3.8 mmol/L (ref 3.5–5.2)
Sodium: 142 mmol/L (ref 134–144)

## 2018-04-30 ENCOUNTER — Ambulatory Visit (INDEPENDENT_AMBULATORY_CARE_PROVIDER_SITE_OTHER)
Admission: RE | Admit: 2018-04-30 | Discharge: 2018-04-30 | Disposition: A | Discharge status: Home / Self Care | Payer: Medicare Other | Source: Ambulatory Visit | Attending: Cardiovascular Disease | Admitting: Cardiovascular Disease

## 2018-04-30 DIAGNOSIS — I712 Thoracic aortic aneurysm, without rupture, unspecified: Secondary | ICD-10-CM

## 2018-04-30 MED ORDER — IOPAMIDOL (ISOVUE-370) INJECTION 76%
100.0000 mL | Freq: Once | INTRAVENOUS | Status: AC | PRN
  Administered 2018-04-30: 100 mL via INTRAVENOUS

## 2018-05-01 ENCOUNTER — Encounter: Payer: Self-pay | Admitting: Cardiovascular Disease

## 2018-05-01 ENCOUNTER — Ambulatory Visit: Payer: Medicare Other | Admitting: Cardiovascular Disease

## 2018-05-01 DIAGNOSIS — I712 Thoracic aortic aneurysm, without rupture, unspecified: Secondary | ICD-10-CM

## 2018-05-01 DIAGNOSIS — I10 Essential (primary) hypertension: Secondary | ICD-10-CM

## 2018-05-01 NOTE — Assessment & Plan Note (Signed)
History of mild aortic insufficiency on 2D echo 1 year ago with normal LV size and function.

## 2018-05-01 NOTE — Progress Notes (Signed)
05/01/2018 [name]   1946-05-04  [id number]  Primary Physician [name], MD Primary Cardiologist: [name] MD [name], [place]  HPI:  [name] is a 72 y.o.  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 I last saw him  04/05/2017.Marland Kitchen He has a history of remote tobacco abuse, having smoked cigars in the past, treated hypertension, dyslipidemia. He has never had a heart attack or stroke. He has had bladder and prostate cancer, and he has had prostate surgery. He is followed by Dr. Algis Greenhouse and has been evaluated by Dr. Liam Rogers in the past with a Myoview stress test that was normal and an event monitor that showed PVCs for palpitations. He does drink 2-4 cups of coffee a day and has had palpitations since he was a teenager. He saw [name] complaining of right-sided chest pain and shortness of breath. He had a CT scan done April 19, 2012 that showed 2 small subsegmental right-sided pulmonary emboli of unclear origin. There is no evidence of DVT. He was placed on Xarelto. Dr. Beryle Beams did a hypercoagulable workup which apparently, according to the patient, was positive for lupus anticoagulant. Since I saw him he has remained asymptomatic. We have been following his small thoracic aortic aneurysm which a year ago measured 4.7 cm In size on a year ago he is remained stable.  Chest CTA performed yesterday revealed stable thoracic aortic aneurysm measuring 4.7 cm.  This is followed by Dr. Cyndia Bent as well.  Current Meds  Medication Sig  . ALPRAZolam (XANAX) 0.5 MG tablet Take 0.5 mg by mouth 3 (three) times daily as needed.   Marland Kitchen co-enzyme Q-10 30 MG capsule Take 30 mg by mouth every evening.   Marland Kitchen CRESTOR 5 MG tablet TAKE 1 TABLET BY MOUTH EVERY DAY (Patient taking differently: TAKE 1 TABLET BY MOUTH EVERY DAY-- takes in am)  . fish  oil-omega-3 fatty acids 1000 MG capsule Take 1 g by mouth daily.   . hydrochlorothiazide (HYDRODIURIL) 25 MG tablet Take 25 mg by mouth daily.  . Hypertonic Nasal Wash (SINUS RINSE BOTTLE KIT) PACK Place into the nose at bedtime.    . metoprolol tartrate (LOPRESSOR) 100 MG tablet TAKE 1 TABLET BY MOUTH TWICE A DAY  . Multiple Vitamin (MULTIVITAMIN) tablet Take 1 tablet by mouth daily.    Marland Kitchen nystatin cream (MYCOSTATIN) Apply 1 application topically 2 (two) times daily.  Marland Kitchen oxyCODONE (OXY IR/ROXICODONE) 5 MG immediate release tablet Take 1-2 tablets (5-10 mg total) by mouth every 4 (four) hours as needed for moderate pain, severe pain or breakthrough pain.  Marland Kitchen oxymetazoline (AFRIN) 0.05 % nasal spray Place 2 sprays into the nose 2 (two) times daily.    . pantoprazole (PROTONIX) 40 MG tablet TAKE 1 TABLET BY MOUTH EVERY DAY  . polyethylene glycol powder (GLYCOLAX/MIRALAX) powder Take 1 Container by mouth daily. 1  1/2 capful daily  . Psyllium (METAMUCIL FIBER PO) Take by mouth. 2 tablespoons mixed with miralax daily  . ramipril (ALTACE) 10 MG capsule TAKE 1 CAPSULE (10 MG TOTAL) BY MOUTH 2 (TWO) TIMES DAILY  . Rivaroxaban (XARELTO) 20 MG TABS Take 20 mg by mouth daily.  . sertraline (ZOLOFT) 100 MG tablet Take 100 mg by mouth daily.  . sildenafil (VIAGRA) 50 MG tablet Take 100 mg by mouth daily as needed.   . [DISCONTINUED] diltiazem 2 %  GEL Apply per rectum twice daily  . [DISCONTINUED] NON FORMULARY   . [DISCONTINUED] rOPINIRole (REQUIP) 1 MG tablet Take 2 mg by mouth at bedtime.      Allergies  Allergen Reactions  . Aspirin Other (See Comments)    REACTION: gi upset with higher dose  . Penicillins Itching    Social History   Socioeconomic History  . Marital status: Married    Spouse name: Not on file  . Number of children: 2  . Years of education: Not on file  . Highest education level: Not on file  Occupational History  . Occupation: Art gallery manager  . Occupation: FIELD  [employer]    Employer: [employer]  Social Needs  . Financial resource strain: Not on file  . Food insecurity:    Worry: Not on file    Inability: Not on file  . Transportation needs:    Medical: Not on file    Non-medical: Not on file  Tobacco Use  . Smoking status: Current Some Day Smoker    Years: 51.00    Types: Cigars  . Smokeless tobacco: Never Used  . Tobacco comment: 5 cigars /week--/  smoked cigarettes for 10 years quit 1990's  Substance and Sexual Activity  . Alcohol use: Yes    Alcohol/week: 14.0 standard drinks    Types: 14 Glasses of wine per week    Comment: 2 glasses wine/day, cut out wine , drinking beer  . Drug use: No  . Sexual activity: Not on file  Lifestyle  . Physical activity:    Days per week: Not on file    Minutes per session: Not on file  . Stress: Not on file  Relationships  . Social connections:    Talks on phone: Not on file    Gets together: Not on file    Attends religious service: Not on file    Active member of club or organization: Not on file    Attends meetings of clubs or organizations: Not on file    Relationship status: Not on file  . Intimate partner violence:    Fear of current or ex partner: Not on file    Emotionally abused: Not on file    Physically abused: Not on file    Forced sexual activity: Not on file  Other Topics Concern  . Not on file  Social History Narrative   Gets regular exercise.   Daily caffeine- 2 cups daily.   Education: [employer]     Review of Systems: General: negative for chills, fever, night sweats or weight changes.  Cardiovascular: negative for chest pain, dyspnea on exertion, edema, orthopnea, palpitations, paroxysmal nocturnal dyspnea or shortness of breath Dermatological: negative for rash Respiratory: negative for cough or wheezing Urologic: negative for hematuria Abdominal: negative for nausea, vomiting, diarrhea, bright red blood per rectum, melena, or hematemesis Neurologic: negative for  visual changes, syncope, or dizziness All other systems reviewed and are otherwise negative except as noted above.    Blood pressure 138/80, pulse 66, height _0  (1.803 m), weight 207 lb (93.9 kg).  General appearance: alert and no distress Neck: no adenopathy, no carotid bruit, no JVD, supple, symmetrical, trachea midline and thyroid not enlarged, symmetric, no tenderness/mass/nodules Lungs: clear to auscultation bilaterally Heart: regular rate and rhythm, S1, S2 normal, no murmur, click, rub or gallop Extremities: extremities normal, atraumatic, no cyanosis or edema Pulses: 2+ and symmetric Skin: Skin color, texture, turgor normal. No rashes or lesions Neurologic: Alert and oriented X 3,  normal strength and tone. Normal symmetric reflexes. Normal coordination and gait  EKG sinus rhythm at 66 without ST or T wave changes.  I personally reviewed this EKG.  ASSESSMENT AND PLAN:   HYPERLIPIDEMIA History of hyper lipidemia on statin therapy with lipid profile performed 10/09/2017 revealing total cholesterol 131, LDL 78 and HDL of 37.  Essential hypertension History of essential hypertension with blood pressure measured today at 138/80.  He is on metoprolol and hydrochlorothiazide as well as ramipril.  Continue current meds at current dosing  Thoracic aortic aneurysm [facility]) History of thoracic aortic aneurysm measuring 4.7 cm by recent chest CTA performed 04/30/2018 unchanged from a year ago.  Will follow this on an annual basis.  He is seeing Dr. Cyndia Bent follows this as well  Aortic insufficiency History of mild aortic insufficiency on 2D echo 1 year ago with normal LV size and function.      [name] MD FACP,FACC,FAHA, [facility] 05/01/2018 11:14 AM

## 2018-05-01 NOTE — Assessment & Plan Note (Signed)
History of hyper lipidemia on statin therapy with lipid profile performed 10/09/2017 revealing total cholesterol 131, LDL 78 and HDL of 37.

## 2018-05-01 NOTE — Assessment & Plan Note (Signed)
History of thoracic aortic aneurysm measuring 4.7 cm by recent chest CTA performed 04/30/2018 unchanged from a year ago.  Will follow this on an annual basis.  He is seeing Dr. Cyndia Bent follows this as well

## 2018-05-01 NOTE — Patient Instructions (Signed)
Medication Instructions:  Your physician recommends that you continue on your current medications as directed. Please refer to the Current Medication list given to you today.   Labwork: None   Testing/Procedures: Non-Cardiac CT Angiography (CTA), is a special type of CT scan that uses a computer to produce multi-dimensional views of major blood vessels throughout the body. In CT angiography, a contrast material is injected through an IV to help visualize the blood vessels ( To be scheduled in 1 year)   Follow-Up: Your physician wants you to follow-up in: 12 months with Dr.Berry You will receive a reminder letter in the mail two months in advance. If you don't receive a letter, please call our office to schedule the follow-up appointment.   Any Other Special Instructions Will Be Listed Below (If Applicable).     If you need a refill on your cardiac medications before your next appointment, please call your pharmacy.

## 2018-05-01 NOTE — Assessment & Plan Note (Signed)
History of essential hypertension with blood pressure measured today at 138/80.  He is on metoprolol and hydrochlorothiazide as well as ramipril.  Continue current meds at current dosing

## 2018-05-09 ENCOUNTER — Ambulatory Visit: Payer: Medicare Other | Admitting: Surgery

## 2018-05-14 ENCOUNTER — Other Ambulatory Visit: Payer: Self-pay | Admitting: Cardiovascular Disease

## 2018-05-15 ENCOUNTER — Other Ambulatory Visit: Payer: Self-pay | Admitting: Cardiovascular Disease

## 2018-05-15 NOTE — Telephone Encounter (Signed)
Rx request sent to pharmacy.  

## 2018-05-17 ENCOUNTER — Ambulatory Visit: Payer: Medicare Other | Admitting: Surgery

## 2018-05-21 DIAGNOSIS — M19011 Primary osteoarthritis, right shoulder: Secondary | ICD-10-CM | POA: Diagnosis not present

## 2018-05-21 DIAGNOSIS — M7541 Impingement syndrome of right shoulder: Secondary | ICD-10-CM | POA: Diagnosis not present

## 2018-05-23 ENCOUNTER — Ambulatory Visit: Payer: Medicare Other | Admitting: Surgery

## 2018-05-23 ENCOUNTER — Encounter: Payer: Self-pay | Admitting: Surgery

## 2018-05-23 ENCOUNTER — Other Ambulatory Visit: Payer: Self-pay

## 2018-05-23 VITALS — BP 128/72 | HR 68 | Resp 18 | Ht 71.0 in | Wt 209.2 lb

## 2018-05-23 DIAGNOSIS — I712 Thoracic aortic aneurysm, without rupture, unspecified: Secondary | ICD-10-CM

## 2018-05-23 NOTE — Progress Notes (Signed)
HPI:  The patient returns today for follow-up of a 4.7 cm fusiform ascending aortic aneurysm.  He is feeling well and is been watching his diet closely and getting regular exercise.  He is lost about 30 pounds.  He denies any chest pain or shortness of breath.  He has a brother who has a 4.5 cm fusiform ascending aortic aneurysm and his mother also had an aneurysm of unknown size.  She died in her 71s but not related to the aneurysm.  Current Outpatient Medications  Medication Sig Dispense Refill  . ALPRAZolam (XANAX) 0.5 MG tablet Take 0.5 mg by mouth 3 (three) times daily as needed.     Marland Kitchen co-enzyme Q-10 30 MG capsule Take 30 mg by mouth every evening.     Marland Kitchen CRESTOR 5 MG tablet TAKE 1 TABLET BY MOUTH EVERY DAY (Patient taking differently: TAKE 1 TABLET BY MOUTH EVERY DAY-- takes in am) 30 tablet 8  . fish oil-omega-3 fatty acids 1000 MG capsule Take 1 g by mouth daily.     . hydrochlorothiazide (HYDRODIURIL) 25 MG tablet Take 25 mg by mouth daily.    . Hypertonic Nasal Wash (SINUS RINSE BOTTLE KIT) PACK Place into the nose at bedtime.      . metoprolol tartrate (LOPRESSOR) 100 MG tablet TAKE 1 TABLET BY MOUTH TWICE A DAY 180 tablet 0  . Multiple Vitamin (MULTIVITAMIN) tablet Take 1 tablet by mouth daily.      Marland Kitchen nystatin cream (MYCOSTATIN) Apply 1 application topically 2 (two) times daily. 30 g 2  . oxyCODONE (OXY IR/ROXICODONE) 5 MG immediate release tablet Take 1-2 tablets (5-10 mg total) by mouth every 4 (four) hours as needed for moderate pain, severe pain or breakthrough pain. 40 tablet 0  . oxymetazoline (AFRIN) 0.05 % nasal spray Place 2 sprays into the nose 2 (two) times daily.      . pantoprazole (PROTONIX) 40 MG tablet TAKE 1 TABLET BY MOUTH EVERY DAY 90 tablet 1  . polyethylene glycol powder (GLYCOLAX/MIRALAX) powder Take 1 Container by mouth daily. 1  1/2 capful daily    . Psyllium (METAMUCIL FIBER PO) Take by mouth. 2 tablespoons mixed with miralax daily    . ramipril  (ALTACE) 10 MG capsule TAKE 1 CAPSULE (10 MG TOTAL) BY MOUTH 2 (TWO) TIMES DAILY 180 capsule 2  . ramipril (ALTACE) 10 MG capsule TAKE 1 CAPSULE (10 MG TOTAL) BY MOUTH 2 (TWO) TIMES DAILY 180 capsule 0  . Rivaroxaban (XARELTO) 20 MG TABS Take 20 mg by mouth daily.    . sertraline (ZOLOFT) 100 MG tablet Take 100 mg by mouth daily.    . sildenafil (VIAGRA) 50 MG tablet Take 100 mg by mouth daily as needed.      No current facility-administered medications for this visit.      Physical Exam: BP 128/72 (BP Location: Left Arm, Patient Position: Sitting, Cuff Size: Normal)   Pulse 68   Resp 18   Ht 5' 11"  (1.803 m)   Wt 209 lb 3.2 oz (94.9 kg)   SpO2 97% Comment: RA  BMI 29.18 kg/m  He looks well. Cardiac exam shows regular rate and rhythm with normal heart sounds.  There is no murmur. Lung exam is clear.  Diagnostic Tests:  CLINICAL DATA:  72 year old male with a history of thoracic aortic aneurysm.  EXAM: CT ANGIOGRAPHY CHEST WITH CONTRAST  TECHNIQUE: Multidetector CT imaging of the chest was performed using the standard protocol during bolus administration of intravenous  contrast. Multiplanar CT image reconstructions and MIPs were obtained to evaluate the vascular anatomy.  CONTRAST:  187m ISOVUE-370 IOPAMIDOL (ISOVUE-370) INJECTION 76%  COMPARISON:  04/19/2017, 04/20/2016, 07/21/2015  FINDINGS: Cardiovascular: Heart size unchanged. No pericardial fluid/thickening. Calcifications of left anterior descending and circumflex coronary arteries.  Diameter of the ascending aorta is unchanged from the most recent comparison CT, measuring 4.7 cm. The diameter on the most remote CT dated 10/25/2008 measured approximately 4.3 cm. No dissection. No intramural hematoma. No periaortic fluid or inflammatory changes. Mild atherosclerotic changes of the descending thoracic aorta.  Four vessel arch with mild atherosclerotic changes. Cervical arteries patent at the base of the  neck. Bilateral subclavian arteries and axillary arteries patent.  Main pulmonary artery diameter measures 2.6 cm. No filling defects within the main pulmonary artery, lobar arteries, or segmental pulmonary arteries.  Mediastinum/Nodes: No mediastinal adenopathy. Unremarkable appearance of the thoracic inlet. Unremarkable course of the thoracic esophagus. Central airways are patent.  Lungs/Pleura: No pleural effusion. No pneumothorax. No confluent airspace disease.  The peripheral nodules at the base of the right and left lung are unchanged, likely benign. Nodule at the left lung base on image 85 measures 3 mm and is unchanged. Nodule at the base of the right lung on image 83 measures 2 mm and is unchanged.  Upper Abdomen: No acute finding of the upper abdomen.  Diffusely decreased attenuation/enhancement of liver parenchyma again noted. Hyperintense/hyperenhancing focus at the false form ligament, not as well appreciated on the current study as the prior. On the current study this appears more within the parenchyma of the liver on coronal reformatted images.  Splenomegaly.  Musculoskeletal: No acute displaced fracture. Degenerative changes of the thoracic spine.  Review of the MIP images confirms the above findings.  IMPRESSION: No acute CT finding.  Ascending thoracic aortic aneurysm, 4.7 cm, unchanged from the most recent comparison CT, and increased from 4.3 cm on the most remote comparison CT, dated 10/25/2008. Recommend semi-annual imaging followup by CTA or MRA and referral to cardiothoracic surgery if not already obtained. This recommendation follows 2010 ACCF/AHA/AATS/ACR/ASA/SCA/SCAI/SIR/STS/SVM Guidelines for the Diagnosis and Management of Patients With Thoracic Aortic Disease. Circulation. 2010; 121:: E268-T419Aortic aneurysm NOS (ICD10-I71.9).  Hyperenhancing focus at the falciform ligament is not as well appreciated on the current study. This  may represent a partially thrombosed visceral aneurysm or a parenchymal vascular lesion such as atypical hemangioma.  Ancillary findings, as above.  Signed,  JDulcy Fanny WDellia Nims RPVI  [facility]  [facility]   Electronically Signed   By: JCorrie MckusickD.O.   On: 04/30/2018 14:47   Impression:  This gentleman has a 4.7 cm fusiform ascending aortic aneurysm that has been stable over the past couple years.  His first CT scan was in 2010 and it was 4.3 cm at that time.  His blood pressure is under good control.  This aneurysm is below the 5.5 cm surgical threshold.  Since it has been stable over the past 2 years I think it is reasonable to wait another year before doing another CT scan of the chest.  I stressed the importance of maintaining good blood pressure control and preventing further enlargement and aortic dissection.  Plan:  He will return to see me in 1 year with a CTA of the chest.   I spent 15 minutes performing this established patient evaluation and > 50% of this time was spent face to face counseling and coordinating the care of this patient's  aortic aneurysm.    Gaye Pollack, MD [facility] [phone]

## 2018-06-01 ENCOUNTER — Other Ambulatory Visit: Payer: Self-pay | Admitting: Cardiovascular Disease

## 2018-07-20 ENCOUNTER — Inpatient Hospital Stay: Payer: Medicare Other | Attending: Oncology

## 2018-07-20 DIAGNOSIS — Z86718 Personal history of other venous thrombosis and embolism: Secondary | ICD-10-CM | POA: Insufficient documentation

## 2018-07-20 DIAGNOSIS — Z8546 Personal history of malignant neoplasm of prostate: Secondary | ICD-10-CM

## 2018-07-20 DIAGNOSIS — R102 Pelvic and perineal pain: Secondary | ICD-10-CM | POA: Insufficient documentation

## 2018-07-20 DIAGNOSIS — Z79899 Other long term (current) drug therapy: Secondary | ICD-10-CM | POA: Diagnosis not present

## 2018-07-20 DIAGNOSIS — Z9079 Acquired absence of other genital organ(s): Secondary | ICD-10-CM | POA: Diagnosis not present

## 2018-07-20 DIAGNOSIS — Z7901 Long term (current) use of anticoagulants: Secondary | ICD-10-CM | POA: Insufficient documentation

## 2018-07-20 LAB — CBC WITH DIFFERENTIAL/PLATELET
Abs Immature Granulocytes: 0.02 10*3/uL (ref 0.00–0.07)
BASOS PCT: 0 %
Basophils Absolute: 0 10*3/uL (ref 0.0–0.1)
Eosinophils Absolute: 0.1 10*3/uL (ref 0.0–0.5)
Eosinophils Relative: 2 %
HEMATOCRIT: 41.2 % (ref 39.0–52.0)
Hemoglobin: 13.7 g/dL (ref 13.0–17.0)
Immature Granulocytes: 0 %
LYMPHS ABS: 1.5 10*3/uL (ref 0.7–4.0)
Lymphocytes Relative: 28 %
MCH: 30.2 pg (ref 26.0–34.0)
MCHC: 33.3 g/dL (ref 30.0–36.0)
MCV: 90.7 fL (ref 80.0–100.0)
MONO ABS: 0.4 10*3/uL (ref 0.1–1.0)
MONOS PCT: 7 %
Neutro Abs: 3.4 10*3/uL (ref 1.7–7.7)
Neutrophils Relative %: 63 %
PLATELETS: 153 10*3/uL (ref 150–400)
RBC: 4.54 MIL/uL (ref 4.22–5.81)
RDW: 13.5 % (ref 11.5–15.5)
WBC: 5.4 10*3/uL (ref 4.0–10.5)
nRBC: 0 % (ref 0.0–0.2)

## 2018-07-20 LAB — COMPREHENSIVE METABOLIC PANEL
ALBUMIN: 4 g/dL (ref 3.5–5.0)
ALK PHOS: 56 U/L (ref 38–126)
ALT: 23 U/L (ref 0–44)
AST: 20 U/L (ref 15–41)
Anion gap: 9 (ref 5–15)
BILIRUBIN TOTAL: 0.9 mg/dL (ref 0.3–1.2)
BUN: 15 mg/dL (ref 8–23)
CO2: 32 mmol/L (ref 22–32)
CREATININE: 1.1 mg/dL (ref 0.61–1.24)
Calcium: 9.6 mg/dL (ref 8.9–10.3)
Chloride: 102 mmol/L (ref 98–111)
GFR calc Af Amer: >60 mL/min (ref >60)
GFR calc non Af Amer: >60 mL/min (ref >60)
GLUCOSE: 105 mg/dL — AB (ref 70–99)
Potassium: 4.1 mmol/L (ref 3.5–5.1)
Sodium: 143 mmol/L (ref 135–145)
TOTAL PROTEIN: 6.6 g/dL (ref 6.5–8.1)

## 2018-07-21 LAB — PROSTATE-SPECIFIC AG, SERUM (LABCORP): Prostate Specific Ag, Serum: <0.1 ng/mL (ref 0.0–4.0)

## 2018-07-26 ENCOUNTER — Other Ambulatory Visit: Payer: Self-pay | Admitting: Cardiovascular Disease

## 2018-07-27 ENCOUNTER — Inpatient Hospital Stay: Payer: Medicare Other | Admitting: Oncology

## 2018-07-27 ENCOUNTER — Telehealth: Payer: Self-pay

## 2018-07-27 VITALS — BP 142/79 | HR 66 | Temp 97.9°F | Resp 17 | Ht 71.0 in | Wt 211.2 lb

## 2018-07-27 DIAGNOSIS — Z7901 Long term (current) use of anticoagulants: Secondary | ICD-10-CM

## 2018-07-27 DIAGNOSIS — Z79899 Other long term (current) drug therapy: Secondary | ICD-10-CM | POA: Diagnosis not present

## 2018-07-27 DIAGNOSIS — Z8546 Personal history of malignant neoplasm of prostate: Secondary | ICD-10-CM

## 2018-07-27 DIAGNOSIS — R102 Pelvic and perineal pain: Secondary | ICD-10-CM | POA: Diagnosis not present

## 2018-07-27 DIAGNOSIS — Z9079 Acquired absence of other genital organ(s): Secondary | ICD-10-CM

## 2018-07-27 DIAGNOSIS — Z86718 Personal history of other venous thrombosis and embolism: Secondary | ICD-10-CM

## 2018-07-27 NOTE — Telephone Encounter (Signed)
Printed avs and calender of upcoming appointment. Per 12/13 los  

## 2018-07-27 NOTE — Telephone Encounter (Signed)
Rx request sent to pharmacy.  

## 2018-07-27 NOTE — Progress Notes (Signed)
Hematology and Oncology Follow Up Visit  [name] [id number] 20-Jul-1946 72 y.o. 07/27/2018 12:03 PM [name], MDPharr, [name], MD   Principle Diagnosis: 72 year old man with prostate cancer diagnosed in 2010 after presenting with a Gleason score of 3+3 equal 6.     Prior Therapy: Status post prostatectomy done on 10/20/2008.  Current therapy: Active surveillance.   Interim History: [name] returns today for a repeat evaluation.  Since last visit, he reports no major changes.  He does report increased pain in his sacral area although does not recall any trauma or falls.  He did have rectal surgery few years ago but has not had this pain till recently.  He denies any urination difficulties or change in his bowel habits.  His performance status and activity level has not changed.  His appetite and weight is unchanged.   He does not report any headaches or blurry vision double vision or syncope.  He denies any alteration mental status or confusion.  Does not report any fevers chills or sweats. Does not report any chest pain, palpitation or orthopnea. Does not report any nausea, vomiting or abdominal distention.  Denies any change in his bowel habits including constipation or diarrhea.  Denies any hematochezia or melena.  Does not report any frequency urgency or hesitancy. He does not report any arthralgias or myalgias.  He denies any bleeding or clotting tendency.  Rest of his review of systems is negative.  Medications: I have reviewed the patient's current medications.  Current Outpatient Medications  Medication Sig Dispense Refill  . ALPRAZolam (XANAX) 0.5 MG tablet Take 0.5 mg by mouth 3 (three) times daily as needed.     Marland Kitchen co-enzyme Q-10 30 MG capsule Take 30 mg by mouth every evening.     Marland Kitchen CRESTOR 5 MG tablet TAKE 1 TABLET BY MOUTH EVERY DAY (Patient taking differently: TAKE 1 TABLET BY MOUTH EVERY DAY-- takes in am) 30 tablet 8  . fish oil-omega-3 fatty acids 1000 MG  capsule Take 1 g by mouth daily.     . hydrochlorothiazide (HYDRODIURIL) 25 MG tablet Take 25 mg by mouth daily.    . Hypertonic Nasal Wash (SINUS RINSE BOTTLE KIT) PACK Place into the nose at bedtime.      . metoprolol tartrate (LOPRESSOR) 100 MG tablet TAKE 1 TABLET BY MOUTH TWICE A DAY 180 tablet 3  . Multiple Vitamin (MULTIVITAMIN) tablet Take 1 tablet by mouth daily.      Marland Kitchen nystatin cream (MYCOSTATIN) Apply 1 application topically 2 (two) times daily. 30 g 2  . oxyCODONE (OXY IR/ROXICODONE) 5 MG immediate release tablet Take 1-2 tablets (5-10 mg total) by mouth every 4 (four) hours as needed for moderate pain, severe pain or breakthrough pain. 40 tablet 0  . oxymetazoline (AFRIN) 0.05 % nasal spray Place 2 sprays into the nose 2 (two) times daily.      . pantoprazole (PROTONIX) 40 MG tablet TAKE 1 TABLET BY MOUTH EVERY DAY 90 tablet 1  . polyethylene glycol powder (GLYCOLAX/MIRALAX) powder Take 1 Container by mouth daily. 1  1/2 capful daily    . Psyllium (METAMUCIL FIBER PO) Take by mouth. 2 tablespoons mixed with miralax daily    . ramipril (ALTACE) 10 MG capsule TAKE 1 CAPSULE (10 MG TOTAL) BY MOUTH 2 (TWO) TIMES DAILY 180 capsule 2  . ramipril (ALTACE) 10 MG capsule TAKE 1 CAPSULE (10 MG TOTAL) BY MOUTH 2 (TWO) TIMES DAILY 180 capsule 0  . Rivaroxaban (XARELTO) 20 MG  TABS Take 20 mg by mouth daily.    . sertraline (ZOLOFT) 100 MG tablet Take 100 mg by mouth daily.    . sildenafil (VIAGRA) 50 MG tablet Take 100 mg by mouth daily as needed.      No current facility-administered medications for this visit.      Allergies:  Allergies  Allergen Reactions  . Aspirin Other (See Comments)    REACTION: gi upset with higher dose  . Penicillins Itching    Past Medical History, Surgical history, Social history, and Family History were reviewed and updated.  Physical Exam: Blood pressure (!) 142/79, pulse 66, temperature 97.9 F (36.6 C), temperature source Oral, resp. rate 17, height 5'  11" (1.803 m), weight 211 lb 3.2 oz (95.8 kg), SpO2 99 %. ECOG: 0    General appearance: Comfortable appearing without any discomfort Head: Normocephalic without any trauma Oropharynx: Mucous membranes are moist and pink without any thrush or ulcers. Eyes: Pupils are equal and round reactive to light. Lymph nodes: No cervical, supraclavicular, inguinal or axillary lymphadenopathy.   Heart:regular rate and rhythm.  S1 and S2 without leg edema. Lung: Clear without any rhonchi or wheezes.  No dullness to percussion. Abdomin: Soft, nontender, nondistended with good bowel sounds.  No hepatosplenomegaly. Musculoskeletal: No joint deformity or effusion.  Full range of motion noted. Neurological: No deficits noted on motor, sensory and deep tendon reflex exam. Skin: No petechial rash or dryness.  Appeared moist.     Lab Results: Lab Results  Component Value Date   WBC 5.4 07/20/2018   HGB 13.7 07/20/2018   HCT 41.2 07/20/2018   MCV 90.7 07/20/2018   PLT 153 07/20/2018     Chemistry      Component Value Date/Time   NA 143 07/20/2018 0756   NA 142 04/18/2018 1324   NA 140 07/14/2017 0817   K 4.1 07/20/2018 0756   K 3.8 07/14/2017 0817   CL 102 07/20/2018 0756   CL 103 11/02/2012 0737   CO2 32 07/20/2018 0756   CO2 28 07/14/2017 0817   BUN 15 07/20/2018 0756   BUN 12 04/18/2018 1324   BUN 16.8 07/14/2017 0817   CREATININE 1.10 07/20/2018 0756   CREATININE 1.1 07/14/2017 0817      Component Value Date/Time   CALCIUM 9.6 07/20/2018 0756   CALCIUM 9.4 07/14/2017 0817   ALKPHOS 56 07/20/2018 0756   ALKPHOS 67 07/14/2017 0817   AST 20 07/20/2018 0756   AST 35 (H) 07/14/2017 0817   ALT 23 07/20/2018 0756   ALT 57 (H) 07/14/2017 0817   BILITOT 0.9 07/20/2018 0756   BILITOT 0.60 07/14/2017 0817      Results for ANDRIY, SHERK (MRN [id number]) as of 07/27/2018 11:55  Ref. Range 07/14/2017 08:17 07/20/2018 07:57  Prostate Specific Ag, Serum Latest Ref Range: 0.0 - 4.0 ng/mL  <0.1 <0.1    EXAM: CT ANGIOGRAPHY CHEST WITH CONTRAST  TECHNIQUE: Multidetector CT imaging of the chest was performed using the standard protocol during bolus administration of intravenous contrast. Multiplanar CT image reconstructions and MIPs were obtained to evaluate the vascular anatomy.  CONTRAST:  149m ISOVUE-370 IOPAMIDOL (ISOVUE-370) INJECTION 76%  COMPARISON:  04/19/2017, 04/20/2016, 07/21/2015  FINDINGS: Cardiovascular: Heart size unchanged. No pericardial fluid/thickening. Calcifications of left anterior descending and circumflex coronary arteries.  Diameter of the ascending aorta is unchanged from the most recent comparison CT, measuring 4.7 cm. The diameter on the most remote CT dated 10/25/2008 measured approximately 4.3 cm. No dissection. No  intramural hematoma. No periaortic fluid or inflammatory changes. Mild atherosclerotic changes of the descending thoracic aorta.  Four vessel arch with mild atherosclerotic changes. Cervical arteries patent at the base of the neck. Bilateral subclavian arteries and axillary arteries patent.  Main pulmonary artery diameter measures 2.6 cm. No filling defects within the main pulmonary artery, lobar arteries, or segmental pulmonary arteries.  Mediastinum/Nodes: No mediastinal adenopathy. Unremarkable appearance of the thoracic inlet. Unremarkable course of the thoracic esophagus. Central airways are patent.  Lungs/Pleura: No pleural effusion. No pneumothorax. No confluent airspace disease.  The peripheral nodules at the base of the right and left lung are unchanged, likely benign. Nodule at the left lung base on image 85 measures 3 mm and is unchanged. Nodule at the base of the right lung on image 83 measures 2 mm and is unchanged.  Upper Abdomen: No acute finding of the upper abdomen.  Diffusely decreased attenuation/enhancement of liver parenchyma again noted. Hyperintense/hyperenhancing focus at the  false form ligament, not as well appreciated on the current study as the prior. On the current study this appears more within the parenchyma of the liver on coronal reformatted images.  Splenomegaly.  Musculoskeletal: No acute displaced fracture. Degenerative changes of the thoracic spine.  Review of the MIP images confirms the above findings.  IMPRESSION: No acute CT finding.  Ascending thoracic aortic aneurysm, 4.7 cm, unchanged from the most recent comparison CT, and increased from 4.3 cm on the most remote comparison CT, dated 10/25/2008. Recommend semi-annual imaging followup by CTA or MRA and referral to cardiothoracic surgery if not already obtained. This recommendation follows 2010 ACCF/AHA/AATS/ACR/ASA/SCA/SCAI/SIR/STS/SVM Guidelines for the Diagnosis and Management of Patients With Thoracic Aortic Disease. Circulation. 2010; 121: H962-I297 Aortic aneurysm NOS (ICD10-I71.9).  Hyperenhancing focus at the falciform ligament is not as well appreciated on the current study. This may represent a partially thrombosed visceral aneurysm or a parenchymal vascular lesion such as atypical hemangioma.  Ancillary findings, as above.  Signed,  Dulcy Fanny. Dellia Nims, RPVI  [facility]  [facility]    Impression and Plan:   72 year old man with:  1. Prostate cancer presented with localized disease and a Gleason score of 3+3 equal 6.  He underwent prostatectomy in 2010.   The natural course of his disease was reviewed today and risk of relapse was assessed.  His laboratory data on December 6 were reviewed and his PSA remains undetectable.  The risk of relapse is low however he can develop a poorly differentiated malignancy that does not produce PSA and it is reasonable to image his abdomen and pelvis at this time especially the setting of his recent complaints.  Imaging studies of the chest obtained in 05-22-2018  were reviewed and did not show any evidence of malignancy.  He is agreeable to proceed at this time with CT scan of the abdomen and pelvis in the near future.  If no abnormalities are detected, he will have a follow-up as scheduled in 1 year.  2.  Superficial bladder tumor: He is status post TURBT without any recurrence disease.  3.  Pelvic pain: Unclear etiology at this time.  Could be musculoskeletal in nature although cannot rule out malignancy.  I recommend repeat imaging studies of the abdomen and pelvis as discussed.  4.  Deep vein thrombosis: Remains on Xarelto without any recent issues.  No bleeding complications.  5. Follow-up: In 1 year unless his imaging studies showed any abnormalities.  15  minutes was spent with  the patient face-to-face today.  More than 50% of time was dedicated to reviewing his disease status, risk of relapse and answering questions regarding future plan of care.    Zola Button, MD 12/13/201912:03 PM

## 2018-08-07 ENCOUNTER — Ambulatory Visit (HOSPITAL_COMMUNITY): Payer: Medicare Other

## 2018-08-13 ENCOUNTER — Encounter (HOSPITAL_COMMUNITY): Payer: Self-pay

## 2018-08-13 ENCOUNTER — Ambulatory Visit (HOSPITAL_COMMUNITY)
Admission: RE | Admit: 2018-08-13 | Discharge: 2018-08-13 | Disposition: A | Discharge status: Home / Self Care | Payer: Medicare Other | Source: Ambulatory Visit | Attending: Oncology | Admitting: Oncology

## 2018-08-13 DIAGNOSIS — Z8546 Personal history of malignant neoplasm of prostate: Secondary | ICD-10-CM | POA: Insufficient documentation

## 2018-08-13 DIAGNOSIS — K760 Fatty (change of) liver, not elsewhere classified: Secondary | ICD-10-CM | POA: Diagnosis not present

## 2018-08-13 HISTORY — DX: Malignant neoplasm of bladder, unspecified: C67.9

## 2018-08-13 HISTORY — DX: Malignant (primary) neoplasm, unspecified: C80.1

## 2018-08-13 MED ORDER — SODIUM CHLORIDE 0.9 % IV SOLN
INTRAVENOUS | Status: AC
  Filled 2018-08-13: qty 250

## 2018-08-13 MED ORDER — IOHEXOL 300 MG/ML  SOLN
100.0000 mL | Freq: Once | INTRAMUSCULAR | Status: AC | PRN
  Administered 2018-08-13: 100 mL via INTRAVENOUS

## 2018-08-13 MED ORDER — SODIUM CHLORIDE (PF) 0.9 % IJ SOLN
INTRAMUSCULAR | Status: AC
  Filled 2018-08-13: qty 50

## 2018-08-14 ENCOUNTER — Ambulatory Visit (HOSPITAL_COMMUNITY): Payer: Medicare Other

## 2018-08-14 ENCOUNTER — Telehealth: Payer: Self-pay | Admitting: *Deleted

## 2018-08-14 NOTE — Telephone Encounter (Signed)
Spoke with patient, per dr Alen Blew, recent scan is normal.

## 2018-08-14 NOTE — Telephone Encounter (Signed)
-----   Message from Wyatt Portela, MD sent at 08/14/2018  8:30 AM EST ----- Please let him know his scan is normal.

## 2018-10-16 DIAGNOSIS — E7800 Pure hypercholesterolemia, unspecified: Secondary | ICD-10-CM | POA: Diagnosis not present

## 2018-10-16 DIAGNOSIS — I10 Essential (primary) hypertension: Secondary | ICD-10-CM | POA: Diagnosis not present

## 2018-10-17 ENCOUNTER — Other Ambulatory Visit: Payer: Self-pay | Admitting: Cardiovascular Disease

## 2018-10-19 DIAGNOSIS — Z0000 Encounter for general adult medical examination without abnormal findings: Secondary | ICD-10-CM | POA: Diagnosis not present

## 2018-10-19 DIAGNOSIS — I2510 Atherosclerotic heart disease of native coronary artery without angina pectoris: Secondary | ICD-10-CM | POA: Diagnosis not present

## 2018-10-19 DIAGNOSIS — Z7901 Long term (current) use of anticoagulants: Secondary | ICD-10-CM | POA: Diagnosis not present

## 2018-12-04 DIAGNOSIS — G4733 Obstructive sleep apnea (adult) (pediatric): Secondary | ICD-10-CM | POA: Diagnosis not present

## 2018-12-11 ENCOUNTER — Telehealth: Payer: Self-pay | Admitting: Internal Medicine

## 2018-12-11 MED ORDER — PANTOPRAZOLE SODIUM 40 MG PO TBEC: 40.0000 mg | DELAYED_RELEASE_TABLET | Freq: Every day | ORAL | 1 refills | Status: DC

## 2018-12-11 NOTE — Telephone Encounter (Signed)
Pantoprazole refilled as requested. 

## 2019-01-11 ENCOUNTER — Other Ambulatory Visit: Payer: Self-pay | Admitting: Cardiovascular Disease

## 2019-02-11 DIAGNOSIS — M25511 Pain in right shoulder: Secondary | ICD-10-CM | POA: Diagnosis not present

## 2019-02-11 DIAGNOSIS — M19011 Primary osteoarthritis, right shoulder: Secondary | ICD-10-CM | POA: Diagnosis not present

## 2019-02-19 DIAGNOSIS — H16223 Keratoconjunctivitis sicca, not specified as Sjogren's, bilateral: Secondary | ICD-10-CM | POA: Diagnosis not present

## 2019-02-19 DIAGNOSIS — H25013 Cortical age-related cataract, bilateral: Secondary | ICD-10-CM | POA: Diagnosis not present

## 2019-02-19 DIAGNOSIS — H25043 Posterior subcapsular polar age-related cataract, bilateral: Secondary | ICD-10-CM | POA: Diagnosis not present

## 2019-02-19 DIAGNOSIS — H2513 Age-related nuclear cataract, bilateral: Secondary | ICD-10-CM | POA: Diagnosis not present

## 2019-03-11 ENCOUNTER — Telehealth: Payer: Self-pay | Admitting: *Deleted

## 2019-03-11 NOTE — Telephone Encounter (Signed)
A message was left, re: follow up visit. 

## 2019-03-13 DIAGNOSIS — Z8551 Personal history of malignant neoplasm of bladder: Secondary | ICD-10-CM | POA: Diagnosis not present

## 2019-04-10 ENCOUNTER — Ambulatory Visit (INDEPENDENT_AMBULATORY_CARE_PROVIDER_SITE_OTHER): Payer: Medicare Other | Admitting: Internal Medicine

## 2019-04-10 ENCOUNTER — Encounter: Payer: Self-pay | Admitting: Internal Medicine

## 2019-04-10 DIAGNOSIS — K641 Second degree hemorrhoids: Secondary | ICD-10-CM

## 2019-04-10 MED ORDER — HYDROCORTISONE (PERIANAL) 2.5 % EX CREA: TOPICAL_CREAM | Freq: Every evening | CUTANEOUS | 1 refills | Status: DC | PRN

## 2019-04-10 NOTE — Progress Notes (Signed)
[name] 73 y.o. April 05, 1946 [id number]  Assessment & Plan:   Prolapsed internal hemorrhoids, grade 2 Believe cause of flatter stools Treat w/ HC cream - not chronically but prn for now If persistent problems consider ligation Reassured F/u prn  JA:SNKNL, [name], MD    Subjective:   Chief Complaint: thin or flat stools  HPI [name] has been experiencing flattening of stool as it exits rectum/anus. No bleeding. Something does "feel swollen down there"  And can see some dark material when wiping. Does have hx hemorrhoids and IBS, last colonoscopy 2018 w/ 3 polyps - adenomas max 18 mm and diverticulosis Otherwise IBS ok. He takes xarelto due to anti-phospholipid antibody syndrome and hx PE   Allergies  Allergen Reactions  . Aspirin Other (See Comments)    REACTION: gi upset with higher dose  . Penicillins Itching   Current Meds  Medication Sig  . ALPRAZolam (XANAX) 0.5 MG tablet Take 0.5 mg by mouth 3 (three) times daily as needed.   Marland Kitchen co-enzyme Q-10 30 MG capsule Take 30 mg by mouth every evening.   Marland Kitchen CRESTOR 5 MG tablet TAKE 1 TABLET BY MOUTH EVERY DAY (Patient taking differently: TAKE 1 TABLET BY MOUTH EVERY DAY-- takes in am)  . fish oil-omega-3 fatty acids 1000 MG capsule Take 1 g by mouth daily.   . fluticasone (FLONASE) 50 MCG/ACT nasal spray 2 sprays at bedtime.  . hydrochlorothiazide (HYDRODIURIL) 25 MG tablet Take 25 mg by mouth daily.  . Hypertonic Nasal Wash (SINUS RINSE BOTTLE KIT) PACK Place into the nose at bedtime.    . metoprolol tartrate (LOPRESSOR) 100 MG tablet TAKE 1 TABLET BY MOUTH TWICE A DAY  . Multiple Vitamin (MULTIVITAMIN) tablet Take 1 tablet by mouth daily.    Marland Kitchen nystatin cream (MYCOSTATIN) Apply 1 application topically 2 (two) times daily.  Marland Kitchen oxyCODONE (OXY IR/ROXICODONE) 5 MG immediate release tablet Take 1-2 tablets (5-10 mg total) by mouth every 4 (four) hours as needed for moderate pain, severe pain or breakthrough pain.  Marland Kitchen  oxymetazoline (AFRIN) 0.05 % nasal spray Place 2 sprays into the nose 2 (two) times daily.    . pantoprazole (PROTONIX) 40 MG tablet Take 1 tablet (40 mg total) by mouth daily.  . polyethylene glycol powder (GLYCOLAX/MIRALAX) powder Take 1 Container by mouth daily. 1  1/2 capful daily  . Psyllium (METAMUCIL FIBER PO) Take by mouth. 2 tablespoons mixed with miralax daily  . ramipril (ALTACE) 10 MG capsule TAKE 1 CAPSULE (10 MG TOTAL) BY MOUTH 2 (TWO) TIMES DAILY  . Rivaroxaban (XARELTO) 20 MG TABS Take 20 mg by mouth daily.  . sertraline (ZOLOFT) 100 MG tablet Take 100 mg by mouth daily.  . sildenafil (VIAGRA) 50 MG tablet Take 100 mg by mouth daily as needed.    Past Medical History:  Diagnosis Date  . Antiphospholipid antibody syndrome ([place]) 05/22/2012   Found subsequent to recurrent PE 9/13  . Arthritis   . Bladder cancer ([place]) dx'd 2009   surg only  . Chronic anal fissure    w/ recurrence's  . Chronic anxiety   . Depression   . ED (erectile dysfunction)   . Esophageal candidiasis ([place]) 03/14/2017  . Fatty liver   . GERD (gastroesophageal reflux disease)   . Heart murmur   . Hiatal hernia   . History of adenomatous polyp of colon    2005  . History of bladder cancer urologist-  dr grapey   papillary TCC  s/p  turbt  .  History of DVT (deep vein thrombosis)    post prostate surgery 2010  right LLE  . History of esophageal stricture    w/ dilation  . History of gastric polyp    benign 2010  . History of prostate cancer urologist- dr grapey/  oncology-  dr Alen Blew   Stage  T1c , Gleason 3+3;   s/p  prostatectomy 03-08-210//  per pt currently PSA nondetectable   . History of pulmonary embolus (PE)    post prostate surgery 2010  and 2013 x2 right side per CT scan (found to have lupus anticoaglated )  . History of TIA (transient ischemic attack)    07/ 2012 noted on scans remote probable tia's in  age 89's  . History of TMJ syndrome   . HTN (hypertension)   . Hyperlipidemia   .  Lupus anticoagulant positive    found 2013 when pt had PE  . Moderate aortic regurgitation    with no  stenosis   . OSA on CPAP    per study severe osa 12-14-2010  . Prolapsed internal hemorrhoids, grade 2 03/31/2014  . prostate ca dx'd 2010   surg only  . RLS (restless legs syndrome)   . RLS (restless legs syndrome)   . Sleep apnea   . Symptomatic PVCs cardiologist-  dr berry   intermittant  . Wears glasses    Past Surgical History:  Procedure Laterality Date  . APPENDECTOMY  age 47   and Left Inguinal Hernia Repair  . CARDIOVASCULAR STRESS TEST  06-30-2011   dr berry   normal nuclear study/  normal LV function and wall motion, ef 56%  . COLONOSCOPY  last one 10-19-2011  . CYSTO/ BLADDER BX/  RETROGRADE PYELOGRAM/  TRANSRECTAL PROSTATE BX'S  07-30-2008  . ESOPHAGOGASTRODUODENOSCOPY  last one 06-27-2011  . EVALUATION UNDER ANESTHESIA WITH FISTULECTOMY N/A 01/14/2016   Procedure: EXAM UNDER ANESTHESIA WITH REPAIR OF PERIRECTAL FISTULA  (LIFT);  Surgeon: Michael Boston, MD;  Location: [facility];  Service: General;  Laterality: N/A;  . LAPAROSCOPIC CHOLECYSTECTOMY  05-25-2007   and Excision cyst back of neck  . ROBOT ASSISTED LAPAROSCOPIC RADICAL PROSTATECTOMY  10-20-2008  . STRABISMUS SURGERY Bilateral age 62  . TONSILLECTOMY  age 13  . TRANSTHORACIC ECHOCARDIOGRAM  03-23-2015   dr berry   grade 1 diastolic dysfunction, ef 16-60%/  mild AV thickened leaflets with no stenosis,  moderate AR/  mild ascending aorta ilatation3.9cm/  trivial MR and TR  . UPPER GASTROINTESTINAL ENDOSCOPY     Social History   Social History Narrative   Gets regular exercise.   Daily caffeine- 2 cups daily.   Education: [employer]   family history includes Cirrhosis in an other family member; Colon cancer in his father; Colon polyps in his brother and father; Crohn's disease in his brother; Diabetes in his maternal grandmother; Heart disease in his maternal grandmother; Hypertension in his father  and mother; Stroke in his father.   Review of Systems As above  Objective:   Physical Exam BP 130/76   Pulse 64   Temp 98.1 F (36.7 C) (Oral)   Ht 5' 11"  (1.803 m)   Wt 206 lb 6.4 oz (93.6 kg)   BMI 28.79 kg/m  NAD  Rectal - sl decreased anal tone s/p lat int sphincterotomy No mass Prostate absent  Soft stool  ANOSCOPY  Grade 2 prolapsed int hemorrhoids all positions and mild-mod inflammatory change w/ increased vascularity

## 2019-04-10 NOTE — Patient Instructions (Signed)
Use the hydrocortisone cream nightly x 3-5 nights - I think that will fix the hemorrhoids   Let me know if persistent problems.   See you on the track sometime!  I appreciate the opportunity to care for you. Gatha Mayer, MD, Marval Regal

## 2019-04-14 ENCOUNTER — Encounter: Payer: Self-pay | Admitting: Internal Medicine

## 2019-04-14 NOTE — Assessment & Plan Note (Addendum)
Believe cause of flatter stools Treat w/ HC cream - not chronically but prn for now If persistent problems consider ligation Reassured F/u prn

## 2019-04-24 ENCOUNTER — Other Ambulatory Visit: Payer: Self-pay | Admitting: Surgery

## 2019-04-24 DIAGNOSIS — I712 Thoracic aortic aneurysm, without rupture, unspecified: Secondary | ICD-10-CM

## 2019-05-01 ENCOUNTER — Other Ambulatory Visit: Payer: Self-pay

## 2019-05-01 ENCOUNTER — Encounter: Payer: Self-pay | Admitting: Cardiovascular Disease

## 2019-05-01 ENCOUNTER — Ambulatory Visit (INDEPENDENT_AMBULATORY_CARE_PROVIDER_SITE_OTHER): Payer: Medicare Other | Admitting: Cardiovascular Disease

## 2019-05-01 DIAGNOSIS — I10 Essential (primary) hypertension: Secondary | ICD-10-CM

## 2019-05-01 DIAGNOSIS — I712 Thoracic aortic aneurysm, without rupture, unspecified: Secondary | ICD-10-CM

## 2019-05-01 DIAGNOSIS — I351 Nonrheumatic aortic (valve) insufficiency: Secondary | ICD-10-CM | POA: Diagnosis not present

## 2019-05-01 DIAGNOSIS — Z86718 Personal history of other venous thrombosis and embolism: Secondary | ICD-10-CM

## 2019-05-01 NOTE — Patient Instructions (Signed)
Medication Instructions:  Your physician recommends that you continue on your current medications as directed. Please refer to the Current Medication list given to you today.  If you need a refill on your cardiac medications before your next appointment, please call your pharmacy.   Lab work: NONE If you have labs (blood work) drawn today and your tests are completely normal, you will receive your results only by: . MyChart Message (if you have MyChart) OR . A paper copy in the mail If you have any lab test that is abnormal or we need to change your treatment, we will call you to review the results.  Testing/Procedures: Your physician has requested that you have an echocardiogram. Echocardiography is a painless test that uses sound waves to create images of your heart. It provides your doctor with information about the size and shape of your heart and how well your heart's chambers and valves are working. This procedure takes approximately one hour. There are no restrictions for this procedure. LOCATION: [facility]: [address], [place], [place] [address]   Follow-Up: At [facility], you and your health needs are our priority.  As part of our continuing mission to provide you with exceptional heart care, we have created designated Provider Care Teams.  These Care Teams include your primary Cardiologist (physician) and Advanced Practice Providers (APPs -  Physician Assistants and Nurse Practitioners) who all work together to provide you with the care you need, when you need it. . You will need a follow up appointment in 12 months with Dr. Jonathan Berry.  Please call our office 2 months in advance to schedule this/each appointment.       

## 2019-05-01 NOTE — Assessment & Plan Note (Signed)
History of essential hypertension with blood pressure measured today 147/79.  He is on metoprolol and ramipril.

## 2019-05-01 NOTE — Assessment & Plan Note (Signed)
History of pulmonary embolism on Xarelto. °

## 2019-05-01 NOTE — Assessment & Plan Note (Signed)
History of hyperlipidemia on statin therapy with lipid profile performed 10/16/2018 revealing total cholesterol of 153, LDL of 89 HDL 44.

## 2019-05-01 NOTE — Assessment & Plan Note (Signed)
History of mild aortic insufficiency by 2D echo 3 years ago.  We will recheck a 2D echocardiogram.

## 2019-05-01 NOTE — Assessment & Plan Note (Signed)
History of thoracic aortic aneurysm measuring 4.7 cm based chest CTA performed last September.  This is scheduled to be performed again in October of this year.

## 2019-05-01 NOTE — Progress Notes (Signed)
05/01/2019 [name]   09-14-1945  [id number]  Primary Physician [name], MD Primary Cardiologist: [name] MD [name], [place]  HPI:  [name] is a 73 y.o.  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 I last saw him  05/01/2018.Marland Kitchen He has a history of remote tobacco abuse, having smoked cigars in the past, treated hypertension, dyslipidemia. He has never had a heart attack or stroke. He has had bladder and prostate cancer, and he has had prostate surgery. He is followed by Dr. Algis Greenhouse and has been evaluated by Dr. Liam Rogers in the past with a Myoview stress test that was normal and an event monitor that showed PVCs for palpitations. He does drink 2-4 cups of coffee a day and has had palpitations since he was a teenager. He saw [name] complaining of right-sided chest pain and shortness of breath. He had a CT scan done April 19, 2012 that showed 2 small subsegmental right-sided pulmonary emboli of unclear origin. There is no evidence of DVT. He was placed on Xarelto. Dr. Beryle Beams did a hypercoagulable workup which apparently, according to the patient, was positive for lupus anticoagulant. Since I saw him hehas remained asymptomatic. We have been following his small thoracic aortic aneurysm which a year ago measured 4.7 cm   Since I saw him a year ago he is remained stable.  A chest CT performed last September revealed thoracic aortic aneurysm measuring 4.7 cm, unchanged from prior studies.  He denies chest pain or shortness of breath.      Current Meds  Medication Sig   ALPRAZolam (XANAX) 0.5 MG tablet Take 1 mg by mouth 3 (three) times daily as needed.    co-enzyme Q-10 30 MG capsule Take 30 mg by mouth every evening.    CRESTOR 5 MG tablet TAKE 1 TABLET BY MOUTH EVERY DAY (Patient taking differently: TAKE 1 TABLET  BY MOUTH EVERY DAY-- takes in am)   fish oil-omega-3 fatty acids 1000 MG capsule Take 1 g by mouth daily.    fluticasone (FLONASE) 50 MCG/ACT nasal spray 2 sprays at bedtime.   hydrochlorothiazide (HYDRODIURIL) 25 MG tablet Take 25 mg by mouth daily.   hydrocortisone (ANUSOL-HC) 2.5 % rectal cream Place rectally at bedtime as needed for hemorrhoids.   Hypertonic Nasal Wash (SINUS RINSE BOTTLE KIT) PACK Place into the nose at bedtime.     metoprolol tartrate (LOPRESSOR) 100 MG tablet TAKE 1 TABLET BY MOUTH TWICE A DAY   Multiple Vitamin (MULTIVITAMIN) tablet Take 1 tablet by mouth daily.     nystatin cream (MYCOSTATIN) Apply 1 application topically 2 (two) times daily.   oxyCODONE (OXY IR/ROXICODONE) 5 MG immediate release tablet Take 1-2 tablets (5-10 mg total) by mouth every 4 (four) hours as needed for moderate pain, severe pain or breakthrough pain.   oxymetazoline (AFRIN) 0.05 % nasal spray Place 2 sprays into the nose 2 (two) times daily.     pantoprazole (PROTONIX) 40 MG tablet Take 1 tablet (40 mg total) by mouth daily.   polyethylene glycol powder (GLYCOLAX/MIRALAX) powder Take 1 Container by mouth daily. 1  1/2 capful daily   Psyllium (METAMUCIL FIBER PO) Take by mouth. 2 tablespoons mixed with miralax daily   ramipril (ALTACE) 10 MG capsule TAKE 1 CAPSULE (10 MG TOTAL) BY MOUTH 2 (TWO) TIMES DAILY   Rivaroxaban (XARELTO) 20 MG TABS  Take 20 mg by mouth daily.   sertraline (ZOLOFT) 100 MG tablet Take 100 mg by mouth daily.   sildenafil (VIAGRA) 50 MG tablet Take 100 mg by mouth daily as needed.      Allergies  Allergen Reactions   Aspirin Other (See Comments)    REACTION: gi upset with higher dose   Penicillins Itching    Social History   Socioeconomic History   Marital status: Married    Spouse name: Not on file   Number of children: 2   Years of education: Not on file   Highest education level: Not on file  Occupational History   Occupation:  Art gallery manager   Occupation: [employer]    Employer: [employer] resource strain: Not on file   Food insecurity    Worry: Not on file    Inability: Not on file   Transportation needs    Medical: Not on file    Non-medical: Not on file  Tobacco Use   Smoking status: Current Some Day Smoker    Years: 51.00    Types: Cigars   Smokeless tobacco: Never Used   Tobacco comment: 5 cigars /week--/  smoked cigarettes for 10 years quit 1990's  Substance and Sexual Activity   Alcohol use: Yes    Alcohol/week: 14.0 standard drinks    Types: 14 Glasses of wine per week    Comment: 2 glasses wine/day, cut out wine , drinking beer   Drug use: No   Sexual activity: Not on file  Lifestyle   Physical activity    Days per week: Not on file    Minutes per session: Not on file   Stress: Not on file  Relationships   Social connections    Talks on phone: Not on file    Gets together: Not on file    Attends religious service: Not on file    Active member of club or organization: Not on file    Attends meetings of clubs or organizations: Not on file    Relationship status: Not on file   Intimate partner violence    Fear of current or ex partner: Not on file    Emotionally abused: Not on file    Physically abused: Not on file    Forced sexual activity: Not on file  Other Topics Concern   Not on file  Social History Narrative   Married [place] ([place])   Gets regular exercise.   Daily caffeine- 2 cups daily.   Education: MS Estate manager/land agent   2 kids + grandchildren   Enjoys on track car driving (e.g, VIR)        Review of Systems: General: negative for chills, fever, night sweats or weight changes.  Cardiovascular: negative for chest pain, dyspnea on exertion, edema, orthopnea, palpitations, paroxysmal nocturnal dyspnea or shortness of breath Dermatological: negative for rash Respiratory: negative for cough or  wheezing Urologic: negative for hematuria Abdominal: negative for nausea, vomiting, diarrhea, bright red blood per rectum, melena, or hematemesis Neurologic: negative for visual changes, syncope, or dizziness All other systems reviewed and are otherwise negative except as noted above.    Blood pressure (!) 147/79, pulse 68, temperature (!) 95.9 F (35.5 C), height 5' 11"  (1.803 m), weight 210 lb (95.3 kg), SpO2 99 %.  General appearance: alert and no distress Neck: no adenopathy, no carotid bruit, no JVD, supple, symmetrical, trachea midline and thyroid not enlarged, symmetric, no tenderness/mass/nodules Lungs: clear to auscultation  bilaterally Heart: regular rate and rhythm, S1, S2 normal, no murmur, click, rub or gallop Extremities: extremities normal, atraumatic, no cyanosis or edema Pulses: 2+ and symmetric Skin: Skin color, texture, turgor normal. No rashes or lesions Neurologic: Alert and oriented X 3, normal strength and tone. Normal symmetric reflexes. Normal coordination and gait  EKG sinus rhythm at 65 without ST or T wave changes.  Personally reviewed this EKG.  ASSESSMENT AND PLAN:   HYPERLIPIDEMIA History of hyperlipidemia on statin therapy with lipid profile performed 10/16/2018 revealing total cholesterol of 153, LDL of 89 HDL 44.  Essential hypertension History of essential hypertension with blood pressure measured today 147/79.  He is on metoprolol and ramipril.  PULMONARY EMBOLISM, HX OF History of pulmonary embolism on Xarelto  Thoracic aortic aneurysm [facility]) History of thoracic aortic aneurysm measuring 4.7 cm based chest CTA performed last September.  This is scheduled to be performed again in October of this year.  Aortic insufficiency History of mild aortic insufficiency by 2D echo 3 years ago.  We will recheck a 2D echocardiogram.      [name] MD [facility], [facility] 05/01/2019 4:34 PM

## 2019-05-08 ENCOUNTER — Other Ambulatory Visit: Payer: Self-pay

## 2019-05-08 ENCOUNTER — Ambulatory Visit (HOSPITAL_COMMUNITY): Payer: Medicare Other | Attending: Cardiology

## 2019-05-08 DIAGNOSIS — I351 Nonrheumatic aortic (valve) insufficiency: Secondary | ICD-10-CM | POA: Diagnosis not present

## 2019-05-09 ENCOUNTER — Other Ambulatory Visit: Payer: Self-pay | Admitting: *Deleted

## 2019-05-09 DIAGNOSIS — I351 Nonrheumatic aortic (valve) insufficiency: Secondary | ICD-10-CM

## 2019-05-22 ENCOUNTER — Other Ambulatory Visit: Payer: Self-pay | Admitting: Cardiovascular Disease

## 2019-05-29 ENCOUNTER — Other Ambulatory Visit: Payer: Self-pay

## 2019-05-29 MED ORDER — PANTOPRAZOLE SODIUM 40 MG PO TBEC: 40.0000 mg | DELAYED_RELEASE_TABLET | Freq: Every day | ORAL | 3 refills | Status: DC

## 2019-05-29 NOTE — Telephone Encounter (Signed)
Pantoprazole refilled. 

## 2019-06-05 ENCOUNTER — Other Ambulatory Visit: Payer: Self-pay

## 2019-06-05 ENCOUNTER — Ambulatory Visit
Admission: RE | Admit: 2019-06-05 | Discharge: 2019-06-05 | Disposition: A | Discharge status: Home / Self Care | Payer: Medicare Other | Source: Ambulatory Visit | Attending: Surgery | Admitting: Surgery

## 2019-06-05 ENCOUNTER — Encounter: Payer: Self-pay | Admitting: Surgery

## 2019-06-05 ENCOUNTER — Ambulatory Visit (INDEPENDENT_AMBULATORY_CARE_PROVIDER_SITE_OTHER): Payer: Medicare Other | Admitting: Surgery

## 2019-06-05 VITALS — BP 146/73 | HR 74 | Temp 97.3°F | Resp 16 | Ht 71.0 in | Wt 231.0 lb

## 2019-06-05 DIAGNOSIS — I712 Thoracic aortic aneurysm, without rupture, unspecified: Secondary | ICD-10-CM

## 2019-06-05 MED ORDER — IOPAMIDOL (ISOVUE-370) INJECTION 76%
75.0000 mL | Freq: Once | INTRAVENOUS | Status: AC | PRN
  Administered 2019-06-05: 75 mL via INTRAVENOUS

## 2019-06-05 NOTE — Progress Notes (Signed)
HPI:  The patient is a 73 year old gentleman who returns for follow-up of a fusiform ascending aortic aneurysm which was measured at 4.7 cm CTA for the past 2 years.  He continues to feel well without chest pain or shortness of breath.  He remains active.  He has a brother who has a 4.5 cm fusiform ascending aortic aneurysm and his mother also had an aneurysm of unknown size.  She died in her 3s but not related to the aneurysm.   Current Outpatient Medications  Medication Sig Dispense Refill  . ALPRAZolam (XANAX) 0.5 MG tablet Take 1 mg by mouth 2 (two) times daily as needed.     Marland Kitchen co-enzyme Q-10 30 MG capsule Take 30 mg by mouth every evening.     Marland Kitchen CRESTOR 5 MG tablet TAKE 1 TABLET BY MOUTH EVERY DAY (Patient taking differently: TAKE 1 TABLET BY MOUTH EVERY DAY-- takes in am) 30 tablet 8  . fish oil-omega-3 fatty acids 1000 MG capsule Take 1 g by mouth daily.     . fluticasone (FLONASE) 50 MCG/ACT nasal spray 2 sprays at bedtime.    . hydrochlorothiazide (HYDRODIURIL) 25 MG tablet Take 25 mg by mouth daily.    . hydrocortisone (ANUSOL-HC) 2.5 % rectal cream Place rectally at bedtime as needed for hemorrhoids. 30 g 1  . Hypertonic Nasal Wash (SINUS RINSE BOTTLE KIT) PACK Place into the nose at bedtime.      . methylcellulose oral powder Take 1 packet by mouth 2 (two) times daily.    . metoprolol tartrate (LOPRESSOR) 100 MG tablet TAKE 1 TABLET BY MOUTH TWICE A DAY 180 tablet 3  . Multiple Vitamin (MULTIVITAMIN) tablet Take 1 tablet by mouth daily.      Marland Kitchen oxyCODONE (OXY IR/ROXICODONE) 5 MG immediate release tablet Take 1-2 tablets (5-10 mg total) by mouth every 4 (four) hours as needed for moderate pain, severe pain or breakthrough pain. 40 tablet 0  . oxymetazoline (AFRIN) 0.05 % nasal spray Place 2 sprays into the nose 2 (two) times daily.      . pantoprazole (PROTONIX) 40 MG tablet Take 1 tablet (40 mg total) by mouth daily. 90 tablet 3  . polyethylene glycol powder  (GLYCOLAX/MIRALAX) powder Take 1 Container by mouth daily. 1  1/2 capful daily    . ramipril (ALTACE) 10 MG capsule TAKE 1 CAPSULE (10 MG TOTAL) BY MOUTH 2 (TWO) TIMES DAILY 180 capsule 2  . Rivaroxaban (XARELTO) 20 MG TABS Take 20 mg by mouth daily.    . sertraline (ZOLOFT) 100 MG tablet Take 100 mg by mouth daily.    Marland Kitchen nystatin cream (MYCOSTATIN) Apply 1 application topically 2 (two) times daily. (Patient not taking: Reported on 06/05/2019) 30 g 2  . Psyllium (METAMUCIL FIBER PO) Take by mouth. 2 tablespoons mixed with miralax daily    . sildenafil (VIAGRA) 50 MG tablet Take 100 mg by mouth daily as needed.      No current facility-administered medications for this visit.      Physical Exam: BP (!) 146/73 (BP Location: Left Arm)   Pulse 74   Temp (!) 97.3 F (36.3 C) (Skin)   Resp 16   Ht 5' 11"  (1.803 m)   Wt 231 lb (104.8 kg)   SpO2 94% Comment: RA  BMI 32.22 kg/m  He looks well. Cardiac exam shows a regular rate and rhythm with normal heart sounds.  There is no murmur. Lungs are clear.  Diagnostic Tests:  CLINICAL DATA:  Thoracic aortic aneurysm.  EXAM: CT ANGIOGRAPHY CHEST WITH CONTRAST  TECHNIQUE: Multidetector CT imaging of the chest was performed using the standard protocol during bolus administration of intravenous contrast. Multiplanar CT image reconstructions and MIPs were obtained to evaluate the vascular anatomy.  CONTRAST:  88m ISOVUE-370 IOPAMIDOL (ISOVUE-370) INJECTION 76%  COMPARISON:  April 30, 2018.  FINDINGS: Cardiovascular: Atherosclerosis of thoracic aorta is noted without dissection. Great vessels are widely patent without significant stenosis. Grossly stable 4.5 cm ascending thoracic aortic aneurysm is noted. Transverse aortic arch measures 2.8 cm. Proximal descending thoracic aorta measures 2.9 cm. Normal cardiac size. No pericardial effusion.  Mediastinum/Nodes: No enlarged mediastinal, hilar, or axillary lymph nodes. Thyroid  gland, trachea, and esophagus demonstrate no significant findings.  Lungs/Pleura: Lungs are clear. No pleural effusion or pneumothorax.  Upper Abdomen: Probable hepatic steatosis. No other significant abnormality seen in visualized portion of upper abdomen.  Musculoskeletal: No chest wall abnormality. No acute or significant osseous findings.  Review of the MIP images confirms the above findings.  IMPRESSION: Grossly stable 4.5 cm ascending thoracic aortic aneurysm. Recommend semi-annual imaging followup by CTA or MRA and referral to cardiothoracic surgery if not already obtained. This recommendation follows 2010 ACCF/AHA/AATS/ACR/ASA/SCA/SCAI/SIR/STS/SVM Guidelines for the Diagnosis and Management of Patients With Thoracic Aortic Disease. Circulation. 2010; 121:: Y333-O329 Aortic aneurysm NOS (ICD10-I71.9).  Aortic Atherosclerosis (ICD10-I70.0).   Electronically Signed   By: JMarijo ConceptionM.D.   On: 06/05/2019 11:44   Impression:  This 73year old gentleman has a stable 4.5 cm fusiform ascending aortic aneurysm.  It has been measured at 4.7 cm on CTA the past 2 years.  This is still well below the 5.5 cm surgical threshold.  I discussed the importance of continued good blood pressure control and preventing further enlargement and acute aortic dissection.  I reviewed the CTA images with him and answered all of his questions.  Plan:  I will see him back in 1 year with a CTA of the chest.  I spent 15 minutes performing this established patient evaluation and > 50% of this time was spent face to face counseling and coordinating the care of this patient's aortic aneurysm.    BGaye Pollack MD [facility] [phone]

## 2019-06-10 DIAGNOSIS — G4733 Obstructive sleep apnea (adult) (pediatric): Secondary | ICD-10-CM | POA: Diagnosis not present

## 2019-06-12 DIAGNOSIS — S20211A Contusion of right front wall of thorax, initial encounter: Secondary | ICD-10-CM | POA: Diagnosis not present

## 2019-06-14 ENCOUNTER — Other Ambulatory Visit: Payer: Self-pay | Admitting: Internal Medicine

## 2019-07-02 ENCOUNTER — Ambulatory Visit: Payer: Medicare Other | Admitting: Nurse Practitioner

## 2019-07-02 ENCOUNTER — Encounter: Payer: Self-pay | Admitting: Nurse Practitioner

## 2019-07-02 ENCOUNTER — Telehealth: Payer: Self-pay

## 2019-07-02 ENCOUNTER — Other Ambulatory Visit: Payer: Self-pay

## 2019-07-02 VITALS — BP 124/64 | HR 67 | Temp 97.4°F | Ht 71.0 in | Wt 213.0 lb

## 2019-07-02 DIAGNOSIS — Z86010 Personal history of colonic polyps: Secondary | ICD-10-CM

## 2019-07-02 DIAGNOSIS — Z1159 Encounter for screening for other viral diseases: Secondary | ICD-10-CM | POA: Diagnosis not present

## 2019-07-02 DIAGNOSIS — R194 Change in bowel habit: Secondary | ICD-10-CM

## 2019-07-02 NOTE — Telephone Encounter (Signed)
   Primary Cardiologist:Dr.Berry  Chart reviewed as part of pre-operative protocol coverage. Given past medical history and time since last visit, based on ACC/AHA guidelines, [name] would be at acceptable risk for the planned colonoscopy without further cardiovascular testing.   He was last seen by Dr. Gwenlyn Found on 05/01/2019, he was doing well without symptoms of bleeding on Xarelto. Procedure: Colonoscopy Date of procedure: 07/17/2019  Patient has a hx of DVT/PE. Per patient hypercoagulable workup was postive for lupus anticoagulant.    Due to patients hypercoagulable state, patient may hold Xarelto 1 day prior to procedure.  I will route this recommendation to the requesting party via Epic fax function and remove from pre-op pool.  Please call with questions.  [name]. Dianna Ewald DNP, ANP, AACC  07/02/2019, 10:53 AM

## 2019-07-02 NOTE — Progress Notes (Addendum)
Chief Complaint:    Flat stools  IMPRESSION and PLAN:    73 year old male here with concern for flat stools.  We have seen him for this before, thought was that hemorrhoids were affecting the shape/size of stool.  No improvement with steroid cream.  Patient concerned about possibility of a colon mass. -Tried to reassure patient -He is due for polyp surveillance colonoscopy in January so will get that scheduled for him sometime after the beginning of the year. -If colonoscopy negative continues to be bothered by internal hemorrhoids then consider ligation.  I gave him a brochure on this.  2. Antiphospholipid antibody syndrome / history of PE on Xarelto.  Hold Xarelto for 2 days before procedure - will instruct when and how to resume after procedure. Patient understands that there is a low but real risk of cardiovascular event such as heart attack, stroke, or embolism /  thrombosis while off blood thinner. The patient consents to proceed. Will communicate by phone or EMR with patient's prescribing provider to confirm that holding Xarelto is reasonable in this case.    HPI:     Patient is a 73 yo male with pmh significant for bladder cancer, prostate cancer, adenomatous colon polyps, diverticulosis, IBS, antiphospholipid antibody syndrome and history of PE on Xarelto.  Patient is here with concerns about having flat stool.  He was seen for this in August.  Reason for flat stools felt to be secondary to hemorrhoids, he was treated with steroid cream.  The cream did not help, stools are still flat.  He sometimes is slow to expel stool from the rectum.  He is taking Citrucel and MiraLAX every day.  No rectal bleeding.  Patient is concerned about the possibility of colon cancer.  He had three 5- 8 mm adenomas removed at time of last colonoscopy January 2018, he is due for  Surveillance colonoscopy this coming January.   Review of systems:     No chest pain, no SOB, no fevers, no urinary sx    Past Medical History:  Diagnosis Date  . Antiphospholipid antibody syndrome ([place]) 05/22/2012   Found subsequent to recurrent PE 9/13  . Arthritis   . Bladder cancer ([place]) dx'd 2009   surg only  . Chronic anal fissure    w/ recurrence's  . Chronic anxiety   . Depression   . ED (erectile dysfunction)   . Esophageal candidiasis ([place]) 03/14/2017  . Fatty liver   . GERD (gastroesophageal reflux disease)   . Heart murmur   . Hiatal hernia   . History of adenomatous polyp of colon    2005  . History of bladder cancer urologist-  dr grapey   papillary TCC  s/p  turbt  . History of DVT (deep vein thrombosis)    post prostate surgery 2010  right LLE  . History of esophageal stricture    w/ dilation  . History of gastric polyp    benign 2010  . History of prostate cancer urologist- dr grapey/  oncology-  dr Alen Blew   Stage  T1c , Gleason 3+3;   s/p  prostatectomy 03-08-210//  per pt currently PSA nondetectable   . History of pulmonary embolus (PE)    post prostate surgery 2010  and 2013 x2 right side per CT scan (found to have lupus anticoaglated )  . History of TIA (transient ischemic attack)    07/ 2012 noted on scans remote probable tia's in  age 20's  .  History of TMJ syndrome   . HTN (hypertension)   . Hyperlipidemia   . Lupus anticoagulant positive    found 2013 when pt had PE  . Moderate aortic regurgitation    with no  stenosis   . OSA on CPAP    per study severe osa 12-14-2010  . Prolapsed internal hemorrhoids, grade 2 03/31/2014  . prostate ca dx'd 2010   surg only  . RLS (restless legs syndrome)   . RLS (restless legs syndrome)   . Sleep apnea   . Symptomatic PVCs cardiologist-  dr berry   intermittant  . Wears glasses     Patient's surgical history, family medical history, social history, medications and allergies were all reviewed in Epic    Current Outpatient Medications  Medication Sig Dispense Refill  . ALPRAZolam (XANAX) 0.5 MG tablet Take 1 mg by mouth  2 (two) times daily as needed.     Marland Kitchen co-enzyme Q-10 30 MG capsule Take 30 mg by mouth every evening.     Marland Kitchen CRESTOR 5 MG tablet TAKE 1 TABLET BY MOUTH EVERY DAY (Patient taking differently: TAKE 1 TABLET BY MOUTH EVERY DAY-- takes in am) 30 tablet 8  . fish oil-omega-3 fatty acids 1000 MG capsule Take 1 g by mouth daily.     . fluticasone (FLONASE) 50 MCG/ACT nasal spray 2 sprays at bedtime.    . hydrochlorothiazide (HYDRODIURIL) 25 MG tablet Take 25 mg by mouth daily.    . hydrocortisone (ANUSOL-HC) 2.5 % rectal cream PLACE RECTALLY AT BEDTIME AS NEEDED FOR HEMORRHOIDS. 30 g 1  . Hypertonic Nasal Wash (SINUS RINSE BOTTLE KIT) PACK Place into the nose at bedtime.      . methylcellulose oral powder Take 1 packet by mouth 2 (two) times daily.    . metoprolol tartrate (LOPRESSOR) 100 MG tablet TAKE 1 TABLET BY MOUTH TWICE A DAY 180 tablet 3  . Multiple Vitamin (MULTIVITAMIN) tablet Take 1 tablet by mouth daily.      Marland Kitchen nystatin cream (MYCOSTATIN) Apply 1 application topically 2 (two) times daily. (Patient not taking: Reported on 06/05/2019) 30 g 2  . oxymetazoline (AFRIN) 0.05 % nasal spray Place 2 sprays into the nose 2 (two) times daily.      . pantoprazole (PROTONIX) 40 MG tablet Take 1 tablet (40 mg total) by mouth daily. 90 tablet 3  . polyethylene glycol powder (GLYCOLAX/MIRALAX) powder Take 1 Container by mouth daily. 1  1/2 capful daily    . Psyllium (METAMUCIL FIBER PO) Take by mouth. 2 tablespoons mixed with miralax daily    . ramipril (ALTACE) 10 MG capsule TAKE 1 CAPSULE (10 MG TOTAL) BY MOUTH 2 (TWO) TIMES DAILY 180 capsule 2  . Rivaroxaban (XARELTO) 20 MG TABS Take 20 mg by mouth daily.    . sertraline (ZOLOFT) 100 MG tablet Take 100 mg by mouth daily.    . sildenafil (VIAGRA) 50 MG tablet Take 100 mg by mouth daily as needed.      No current facility-administered medications for this visit.     Physical Exam:     Temp (!) 97.4 F (36.3 C)   Ht 5' 11"  (1.803 m)   Wt 213 lb  (96.6 kg)   BMI 29.71 kg/m   GENERAL:  Pleasant male in NAD PSYCH: : Cooperative, normal affect EENT:  conjunctiva pink, mucous membranes moist, neck supple without masses CARDIAC:  RRR,  no peripheral edema PULM: Normal respiratory effort, lungs CTA bilaterally, no wheezing ABDOMEN:  Nondistended,  soft, nontender. No obvious masses, no hepatomegaly,  normal bowel sounds SKIN:  turgor, no lesions seen Musculoskeletal:  Normal muscle tone, normal strength NEURO: Alert and oriented x 3, no focal neurologic deficits   Tye Savoy , NP 07/02/2019, 9:16 AM

## 2019-07-02 NOTE — Telephone Encounter (Signed)
One day hold is fine

## 2019-07-02 NOTE — Telephone Encounter (Signed)
Spoke with patient this afternoon.  Advised to hold Xarelto only one day prior to procedure per Dr. Gwenlyn Found. Patient verbalized understanding.

## 2019-07-02 NOTE — Telephone Encounter (Signed)
Patient with diagnosis of DVT/PE post prostatectomy on Xarelto for anticoagulation.    Procedure: Colonoscopy Date of procedure: 07/17/2019  Patient has a hx of DVT/PE. Per patient hypercoagulable workup was postive for lupus anticoagulant.    Due to patients hypercoagulable state, patient may hold Xarelto 1 day prior to procedure.

## 2019-07-02 NOTE — Telephone Encounter (Signed)
[facility] Pre-operative Risk Assessment     Request for surgical clearance:     Endoscopy Procedure  What type of surgery is being performed?     Colonoscopy  When is this surgery scheduled?     07/17/19  What type of clearance is required ?   Pharmacy  Are there any medications that need to be held prior to surgery and how long? [place] TWO DAYS PRIOR  Practice name and name of physician performing surgery?      [facility]/Dr. Carlean Purl  What is your office phone and fax number?      Phone- [phone]  Fax- [phone] Attn: Peter Congo RMA  Anesthesia type (None, local, MAC, general) ?       MAC

## 2019-07-02 NOTE — Patient Instructions (Signed)
If you are age 70 or older, your body mass index should be between 23-30. Your Body mass index is 29.71 kg/m. If this is out of the aforementioned range listed, please consider follow up with your Primary Care Provider.  If you are age 18 or younger, your body mass index should be between 19-25. Your Body mass index is 29.71 kg/m. If this is out of the aformentioned range listed, please consider follow up with your Primary Care Provider.   You have been scheduled for a colonoscopy. Please follow written instructions given to you at your visit today.  Please pick up your prep supplies at the pharmacy within the next 1-3 days. If you use inhalers (even only as needed), please bring them with you on the day of your procedure. Your physician has requested that you go to www.startemmi.com and enter the access code given to you at your visit today. This web site gives a general overview about your procedure. However, you should still follow specific instructions given to you by our office regarding your preparation for the procedure.  You will be contacted by our office prior to your procedure for directions on holding your Xarelto.  If you do not hear from our office 1 week prior to your scheduled procedure, please call [phone] to discuss.   Thank you for choosing me and [facility].   Tye Savoy, NP

## 2019-07-08 ENCOUNTER — Encounter: Payer: Self-pay | Admitting: Internal Medicine

## 2019-07-15 ENCOUNTER — Ambulatory Visit (INDEPENDENT_AMBULATORY_CARE_PROVIDER_SITE_OTHER): Payer: Medicare Other

## 2019-07-15 ENCOUNTER — Other Ambulatory Visit: Payer: Self-pay | Admitting: Internal Medicine

## 2019-07-15 DIAGNOSIS — Z1159 Encounter for screening for other viral diseases: Secondary | ICD-10-CM

## 2019-07-16 LAB — SARS CORONAVIRUS 2 (TAT 6-24 HRS): SARS Coronavirus 2: NEGATIVE

## 2019-07-17 ENCOUNTER — Encounter: Payer: Self-pay | Admitting: Internal Medicine

## 2019-07-17 ENCOUNTER — Telehealth: Payer: Self-pay | Admitting: Oncology

## 2019-07-17 ENCOUNTER — Other Ambulatory Visit: Payer: Self-pay

## 2019-07-17 ENCOUNTER — Ambulatory Visit (AMBULATORY_SURGERY_CENTER): Payer: Medicare Other | Admitting: Internal Medicine

## 2019-07-17 VITALS — BP 147/88 | HR 59 | Temp 98.0°F | Resp 13 | Ht 71.0 in | Wt 213.0 lb

## 2019-07-17 DIAGNOSIS — Z86010 Personal history of colonic polyps: Secondary | ICD-10-CM

## 2019-07-17 DIAGNOSIS — R194 Change in bowel habit: Secondary | ICD-10-CM | POA: Diagnosis not present

## 2019-07-17 DIAGNOSIS — K5730 Diverticulosis of large intestine without perforation or abscess without bleeding: Secondary | ICD-10-CM | POA: Diagnosis not present

## 2019-07-17 DIAGNOSIS — D123 Benign neoplasm of transverse colon: Secondary | ICD-10-CM

## 2019-07-17 DIAGNOSIS — D125 Benign neoplasm of sigmoid colon: Secondary | ICD-10-CM | POA: Diagnosis not present

## 2019-07-17 DIAGNOSIS — K648 Other hemorrhoids: Secondary | ICD-10-CM | POA: Diagnosis not present

## 2019-07-17 MED ORDER — SODIUM CHLORIDE 0.9 % IV SOLN: 500.0000 mL | Freq: Once | INTRAVENOUS | Status: DC

## 2019-07-17 NOTE — Progress Notes (Signed)
Called to room to assist during endoscopic procedure.  Patient ID and intended procedure confirmed with present staff. Received instructions for my participation in the procedure from the performing physician.  

## 2019-07-17 NOTE — Progress Notes (Signed)
Pt's states no medical or surgical changes since previsit or office visit.  LC - temp KA - vitals 

## 2019-07-17 NOTE — Telephone Encounter (Signed)
Called patient per providers request, patient has an invalid number.

## 2019-07-17 NOTE — Progress Notes (Signed)
PT taken to PACU. Monitors in place. VSS. Report given to RN. 

## 2019-07-17 NOTE — Op Note (Signed)
[facility] Patient Name: [name] Procedure Date: 07/17/2019 2:16 PM MRN: [id number] Endoscopist: Gatha Mayer , MD Age: 73 Referring MD:  Date of Birth: 14-Feb-1946 Gender: Male Account #: [id number] Procedure:                Colonoscopy Indications:              Change in stool caliber Medicines:                Propofol per Anesthesia, Monitored Anesthesia Care Procedure:                Pre-Anesthesia Assessment:                           - Prior to the procedure, a History and Physical                            was performed, and patient medications and                            allergies were reviewed. The patient's tolerance of                            previous anesthesia was also reviewed. The risks                            and benefits of the procedure and the sedation                            options and risks were discussed with the patient.                            All questions were answered, and informed consent                            was obtained. Prior Anticoagulants: The patient                            last took Xarelto (rivaroxaban) 2 days prior to the                            procedure. ASA Grade Assessment: III - A patient                            with severe systemic disease. After reviewing the                            risks and benefits, the patient was deemed in                            satisfactory condition to undergo the procedure.                           After obtaining informed consent, the colonoscope  was passed under direct vision. Throughout the                            procedure, the patient's blood pressure, pulse, and                            oxygen saturations were monitored continuously. The                            Colonoscope was introduced through the anus and                            advanced to the the cecum, identified by                            appendiceal  orifice and ileocecal valve. The                            patient tolerated the procedure well. The                            colonoscopy was performed with moderate difficulty                            due to significant looping. Successful completion                            of the procedure was aided by applying abdominal                            pressure. The bowel preparation used was Miralax                            via split dose instruction. The ileocecal valve,                            appendiceal orifice, and rectum were photographed.                            The quality of the bowel preparation was good. Scope In: 2:28:49 PM Scope Out: 2:54:37 PM Scope Withdrawal Time: 0 hours 16 minutes 43 seconds  Total Procedure Duration: 0 hours 25 minutes 48 seconds  Findings:                 The perianal examination was normal.                           The digital rectal exam findings include surgically                            absent prostate.                           Two sessile polyps were found in the sigmoid colon  and transverse colon. The polyps were diminutive in                            size. These polyps were removed with a cold snare.                            Resection and retrieval were complete. Verification                            of patient identification for the specimen was                            done. Estimated blood loss was minimal.                           Multiple diverticula were found in the sigmoid                            colon.                           Internal hemorrhoids were found. Complications:            No immediate complications. Estimated Blood Loss:     Estimated blood loss was minimal. Impression:               - A surgically absent prostate found on digital                            rectal exam.                           - Two diminutive polyps in the sigmoid colon and in                             the transverse colon, removed with a cold snare.                            Resected and retrieved.                           - Diverticulosis in the sigmoid colon.                           - Internal hemorrhoids. Recommendation:           - Patient has a contact number available for                            emergencies. The signs and symptoms of potential                            delayed complications were discussed with the                            patient. Return to normal activities tomorrow.  Written discharge instructions were provided to the                            patient.                           - Continue present medications.                           - Resume Xarelto (rivaroxaban) at prior dose                            tomorrow.                           - Repeat colonoscopy is recommended. The                            colonoscopy date will be determined after pathology                            results from today's exam become available for                            review. Gatha Mayer, MD 07/17/2019 3:07:10 PM This report has been signed electronically.

## 2019-07-17 NOTE — Patient Instructions (Addendum)
Handouts given:  Polyps, diverticulosis Resume Xarelto at prior dose tomorrow.  I found and removed 2 tiny polyps.  You still have diverticulosis - thickened muscle rings and pouches in the colon wall. Please read the handout about this condition.  Nothing bad going on.  Your colon is still great!  Restart Xarelto tomorrow.  I appreciate the opportunity to care for you. Gatha Mayer, MD, FACG    YOU HAD AN ENDOSCOPIC PROCEDURE TODAY AT [facility]:   Refer to the procedure report that was given to you for any specific questions about what was found during the examination.  If the procedure report does not answer your questions, please call your gastroenterologist to clarify.  If you requested that your care partner not be given the details of your procedure findings, then the procedure report has been included in a sealed envelope for you to review at your convenience later.  YOU SHOULD EXPECT: Some feelings of bloating in the abdomen. Passage of more gas than usual.  Walking can help get rid of the air that was put into your GI tract during the procedure and reduce the bloating. If you had a lower endoscopy (such as a colonoscopy or flexible sigmoidoscopy) you may notice spotting of blood in your stool or on the toilet paper. If you underwent a bowel prep for your procedure, you may not have a normal bowel movement for a few days.  Please Note:  You might notice some irritation and congestion in your nose or some drainage.  This is from the oxygen used during your procedure.  There is no need for concern and it should clear up in a day or so.  SYMPTOMS TO REPORT IMMEDIATELY:   Following lower endoscopy (colonoscopy or flexible sigmoidoscopy):  Excessive amounts of blood in the stool  Significant tenderness or worsening of abdominal pains  Swelling of the abdomen that is new, acute  Fever of 100F or higher For urgent or emergent issues, a  gastroenterologist can be reached at any hour by calling [phone].   DIET:  We do recommend a small meal at first, but then you may proceed to your regular diet.  Drink plenty of fluids but you should avoid alcoholic beverages for 24 hours.  ACTIVITY:  You should plan to take it easy for the rest of today and you should NOT DRIVE or use heavy machinery until tomorrow (because of the sedation medicines used during the test).    FOLLOW UP: Our staff will call the number listed on your records 48-72 hours following your procedure to check on you and address any questions or concerns that you may have regarding the information given to you following your procedure. If we do not reach you, we will leave a message.  We will attempt to reach you two times.  During this call, we will ask if you have developed any symptoms of COVID 19. If you develop any symptoms (ie: fever, flu-like symptoms, shortness of breath, cough etc.) before then, please call [phone].  If you test positive for Covid 19 in the 2 weeks post procedure, please call and report this information to [place].    If any biopsies were taken you will be contacted by phone or by letter within the next 1-3 weeks.  Please call us at [phone] if you have not heard about the biopsies in 3 weeks.    SIGNATURES/CONFIDENTIALITY: You and/or your care partner have signed paperwork which  will be entered into your electronic medical record.  These signatures attest to the fact that that the information above on your After Visit Summary has been reviewed and is understood.  Full responsibility of the confidentiality of this discharge information lies with you and/or your care-partner.

## 2019-07-19 ENCOUNTER — Telehealth: Payer: Self-pay | Admitting: *Deleted

## 2019-07-19 NOTE — Telephone Encounter (Signed)
Fist follow up call attempt.  Message left to call if any questions or concerns.

## 2019-07-19 NOTE — Telephone Encounter (Signed)
  Follow up Call-  Call back number 07/17/2019 03/03/2017  Post procedure Call Back phone  # [phone]  Permission to leave phone message Yes Yes  Some recent data might be hidden     Patient questions:  Do you have a fever, pain , or abdominal swelling? No. Pain Score  0 *  Have you tolerated food without any problems? Yes.    Have you been able to return to your normal activities? Yes.    Do you have any questions about your discharge instructions: Diet   No. Medications  No. Follow up visit  No.  Do you have questions or concerns about your Care? No.  Actions: * If pain score is 4 or above: No action needed, pain <4.  1. Have you developed a fever since your procedure? no  2.   Have you had an respiratory symptoms (SOB or cough) since your procedure? no  3.   Have you tested positive for COVID 19 since your procedure no  4.   Have you had any family members/close contacts diagnosed with the COVID 19 since your procedure?  no   If yes to any of these questions please route to Joylene John, RN and Alphonsa Gin, Therapist, sports.

## 2019-07-23 ENCOUNTER — Encounter: Payer: Self-pay | Admitting: Internal Medicine

## 2019-07-23 NOTE — Progress Notes (Signed)
2 diminutive adenomas + FHx CRCA possible Recall 2025 My Chart

## 2019-07-26 ENCOUNTER — Other Ambulatory Visit: Payer: Self-pay

## 2019-07-26 ENCOUNTER — Inpatient Hospital Stay: Payer: Medicare Other | Attending: Oncology

## 2019-07-26 DIAGNOSIS — Z8546 Personal history of malignant neoplasm of prostate: Secondary | ICD-10-CM

## 2019-07-26 DIAGNOSIS — Z9079 Acquired absence of other genital organ(s): Secondary | ICD-10-CM | POA: Diagnosis not present

## 2019-07-26 DIAGNOSIS — Z7901 Long term (current) use of anticoagulants: Secondary | ICD-10-CM | POA: Diagnosis not present

## 2019-07-26 DIAGNOSIS — Z79899 Other long term (current) drug therapy: Secondary | ICD-10-CM | POA: Diagnosis not present

## 2019-07-26 DIAGNOSIS — Z86718 Personal history of other venous thrombosis and embolism: Secondary | ICD-10-CM | POA: Diagnosis not present

## 2019-07-26 LAB — CBC WITH DIFFERENTIAL (CANCER CENTER ONLY)
Abs Immature Granulocytes: 0.01 10*3/uL (ref 0.00–0.07)
Basophils Absolute: 0 10*3/uL (ref 0.0–0.1)
Basophils Relative: 0 %
Eosinophils Absolute: 0.1 10*3/uL (ref 0.0–0.5)
Eosinophils Relative: 2 %
HCT: 42.9 % (ref 39.0–52.0)
Hemoglobin: 14.2 g/dL (ref 13.0–17.0)
Immature Granulocytes: 0 %
Lymphocytes Relative: 32 %
Lymphs Abs: 1.7 10*3/uL (ref 0.7–4.0)
MCH: 29.5 pg (ref 26.0–34.0)
MCHC: 33.1 g/dL (ref 30.0–36.0)
MCV: 89.2 fL (ref 80.0–100.0)
Monocytes Absolute: 0.5 10*3/uL (ref 0.1–1.0)
Monocytes Relative: 9 %
Neutro Abs: 3 10*3/uL (ref 1.7–7.7)
Neutrophils Relative %: 57 %
Platelet Count: 165 10*3/uL (ref 150–400)
RBC: 4.81 MIL/uL (ref 4.22–5.81)
RDW: 13.2 % (ref 11.5–15.5)
WBC Count: 5.3 10*3/uL (ref 4.0–10.5)
nRBC: 0 % (ref 0.0–0.2)

## 2019-07-26 LAB — CMP (CANCER CENTER ONLY)
ALT: 32 U/L (ref 0–44)
AST: 26 U/L (ref 15–41)
Albumin: 4.3 g/dL (ref 3.5–5.0)
Alkaline Phosphatase: 54 U/L (ref 38–126)
Anion gap: 7 (ref 5–15)
BUN: 19 mg/dL (ref 8–23)
CO2: 35 mmol/L — ABNORMAL HIGH (ref 22–32)
Calcium: 9.3 mg/dL (ref 8.9–10.3)
Chloride: 101 mmol/L (ref 98–111)
Creatinine: 1 mg/dL (ref 0.61–1.24)
GFR, Est AFR Am: >60 mL/min (ref >60)
GFR, Estimated: >60 mL/min (ref >60)
Glucose, Bld: 99 mg/dL (ref 70–99)
Potassium: 3.7 mmol/L (ref 3.5–5.1)
Sodium: 143 mmol/L (ref 135–145)
Total Bilirubin: 0.8 mg/dL (ref 0.3–1.2)
Total Protein: 7 g/dL (ref 6.5–8.1)

## 2019-07-27 LAB — PROSTATE-SPECIFIC AG, SERUM (LABCORP): Prostate Specific Ag, Serum: <0.1 ng/mL (ref 0.0–4.0)

## 2019-08-02 ENCOUNTER — Inpatient Hospital Stay: Payer: Medicare Other | Admitting: Oncology

## 2019-08-02 ENCOUNTER — Other Ambulatory Visit: Payer: Self-pay

## 2019-08-02 VITALS — BP 145/71 | HR 63 | Temp 98.5°F | Resp 18 | Ht 71.0 in | Wt 215.8 lb

## 2019-08-02 DIAGNOSIS — Z7901 Long term (current) use of anticoagulants: Secondary | ICD-10-CM | POA: Diagnosis not present

## 2019-08-02 DIAGNOSIS — Z8546 Personal history of malignant neoplasm of prostate: Secondary | ICD-10-CM

## 2019-08-02 DIAGNOSIS — Z86718 Personal history of other venous thrombosis and embolism: Secondary | ICD-10-CM | POA: Diagnosis not present

## 2019-08-02 DIAGNOSIS — Z79899 Other long term (current) drug therapy: Secondary | ICD-10-CM | POA: Diagnosis not present

## 2019-08-02 NOTE — Progress Notes (Signed)
Hematology and Oncology Follow Up Visit  ANASTASIOS MELANDER [id number] Aug 27, 1945 73 y.o. 08/02/2019 1:24 PM [name], MDPharr, [name], MD   Principle Diagnosis: 73 year old man with prostate cancer presented with Gleason score 3+3 = 6 and localized disease diagnosed in 2010.  He remains disease-free after surgical resection.   Prior Therapy: Status post prostatectomy done on 10/20/2008.  Current therapy: Active surveillance.   Interim History: Mr. Gearin is here for a follow-up visit.  Since the last visit, he reports no major changes in his health.  He continues to be active and attends activities of daily living.  He denies any abdominal pain or discomfort.  He denies any weight loss or appetite changes.  He denies any bone pain or flank pain.  Patient denied any alteration mental status, neuropathy, confusion or dizziness.  Denies any headaches or lethargy.  Denies any night sweats, weight loss or changes in appetite.  Denied orthopnea, dyspnea on exertion or chest discomfort.  Denies shortness of breath, difficulty breathing hemoptysis or cough.  Denies any abdominal distention, nausea, early satiety or dyspepsia.  Denies any hematuria, frequency, dysuria or nocturia.  Denies any skin irritation, dryness or rash.  Denies any ecchymosis or petechiae.  Denies any lymphadenopathy or clotting.  Denies any heat or cold intolerance.  Denies any anxiety or depression.  Remaining review of system is negative.     Medications: Updated on review. Current Outpatient Medications  Medication Sig Dispense Refill  . ALPRAZolam (XANAX) 0.5 MG tablet Take 1 mg by mouth 2 (two) times daily as needed.     Marland Kitchen co-enzyme Q-10 30 MG capsule Take 30 mg by mouth every evening.     Marland Kitchen CRESTOR 5 MG tablet TAKE 1 TABLET BY MOUTH EVERY DAY (Patient taking differently: TAKE 1 TABLET BY MOUTH EVERY DAY-- takes in am) 30 tablet 8  . fish oil-omega-3 fatty acids 1000 MG capsule Take 1 g by mouth daily.     .  fluticasone (FLONASE) 50 MCG/ACT nasal spray 2 sprays at bedtime.    . hydrochlorothiazide (HYDRODIURIL) 25 MG tablet Take 25 mg by mouth daily.    . hydrocortisone (ANUSOL-HC) 2.5 % rectal cream PLACE RECTALLY AT BEDTIME AS NEEDED FOR HEMORRHOIDS. 30 g 1  . Hypertonic Nasal Wash (SINUS RINSE BOTTLE KIT) PACK Place into the nose at bedtime.      . methylcellulose oral powder Take 1 packet by mouth 2 (two) times daily.    . metoprolol tartrate (LOPRESSOR) 100 MG tablet TAKE 1 TABLET BY MOUTH TWICE A DAY 180 tablet 3  . Multiple Vitamin (MULTIVITAMIN) tablet Take 1 tablet by mouth daily.      Marland Kitchen oxymetazoline (AFRIN) 0.05 % nasal spray Place 2 sprays into the nose 2 (two) times daily.      . pantoprazole (PROTONIX) 40 MG tablet Take 1 tablet (40 mg total) by mouth daily. 90 tablet 3  . ramipril (ALTACE) 10 MG capsule TAKE 1 CAPSULE (10 MG TOTAL) BY MOUTH 2 (TWO) TIMES DAILY 180 capsule 2  . Rivaroxaban (XARELTO) 20 MG TABS Take 20 mg by mouth daily.    . sertraline (ZOLOFT) 100 MG tablet Take 100 mg by mouth daily.    . sildenafil (VIAGRA) 50 MG tablet Take 100 mg by mouth daily as needed.      No current facility-administered medications for this visit.     Allergies:  Allergies  Allergen Reactions  . Aspirin Other (See Comments)    REACTION: gi upset with higher dose  .  Penicillins Itching    Past Medical History, Surgical history, Social history, and Family History unchanged on review.  Physical Exam: Blood pressure (!) 145/71, pulse 63, temperature 98.5 F (36.9 C), temperature source Temporal, resp. rate 18, height _0  (1.803 m), weight 215 lb 12.8 oz (97.9 kg), SpO2 97 %.   ECOG: 0     General appearance: Alert, awake without any distress. Head: Atraumatic without abnormalities Oropharynx: Without any thrush or ulcers. Eyes: No scleral icterus. Lymph nodes: No lymphadenopathy noted in the cervical, supraclavicular, or axillary nodes Heart:regular rate and rhythm,  without any murmurs or gallops.   Lung: Clear to auscultation without any rhonchi, wheezes or dullness to percussion. Abdomin: Soft, nontender without any shifting dullness or ascites. Musculoskeletal: No clubbing or cyanosis. Neurological: No motor or sensory deficits. Skin: No rashes or lesions. Psychiatric: Mood and affect appeared normal.    Lab Results: Lab Results  Component Value Date   WBC 5.3 07/26/2019   HGB 14.2 07/26/2019   HCT 42.9 07/26/2019   MCV 89.2 07/26/2019   PLT 165 07/26/2019     Chemistry      Component Value Date/Time   NA 143 07/26/2019 0818   NA 142 04/18/2018 1324   NA 140 07/14/2017 0817   K 3.7 07/26/2019 0818   K 3.8 07/14/2017 0817   CL 101 07/26/2019 0818   CL 103 11/02/2012 0737   CO2 35 (H) 07/26/2019 0818   CO2 28 07/14/2017 0817   BUN 19 07/26/2019 0818   BUN 12 04/18/2018 1324   BUN 16.8 07/14/2017 0817   CREATININE 1.00 07/26/2019 0818   CREATININE 1.1 07/14/2017 0817      Component Value Date/Time   CALCIUM 9.3 07/26/2019 0818   CALCIUM 9.4 07/14/2017 0817   ALKPHOS 54 07/26/2019 0818   ALKPHOS 67 07/14/2017 0817   AST 26 07/26/2019 0818   AST 35 (H) 07/14/2017 0817   ALT 32 07/26/2019 0818   ALT 57 (H) 07/14/2017 0817   BILITOT 0.8 07/26/2019 0818   BILITOT 0.60 07/14/2017 0817     Results for NATHIAN, STENCIL (MRN [id number]) as of 08/02/2019 13:03  Ref. Range 07/26/2019 08:18  Prostate Specific Ag, Serum Latest Ref Range: 0.0 - 4.0 ng/mL <0.1     Impression and Plan:   73 year old man with:  1. Prostate cancer diagnosed in 2010.  He was found to have Gleason score 6 and status post prostatectomy since that time.  He has no evidence of disease noted.    He continues to have undetectable PSA indicating disease to be in remission.  Imaging studies in the past year including CT scan of the abdomen in December 2019 as well as CT scan of the chest in October 2020 showed no evidence of disease relapse.  Risk of relapse  at this time remains very low and no repeat imaging is needed at this time.   2.  Superficial bladder tumor: Continues to follow with urology at this time without any evidence of recurrence.   3.  Age-appropriate cancer screening: He is up-to-date at this time and he has completed colonoscopy.  4.  Deep vein thrombosis: No recent exacerbation or thrombosis.  Continues to be on Xarelto.  5. Follow-up: Will be in 1 year for repeat evaluation.  15  minutes was spent with the patient face-to-face today.  More than 50% of time was spent on reviewing his disease status, management options as well as answering questions regarding future plan of care.  Zola Button, MD 12/18/20201:24 PM

## 2019-08-05 ENCOUNTER — Telehealth: Payer: Self-pay | Admitting: Oncology

## 2019-08-05 NOTE — Telephone Encounter (Signed)
Scheduled appt per 12/18 los.  Spoke with pt and he is aware of his appt date and time.

## 2019-08-12 ENCOUNTER — Other Ambulatory Visit: Payer: Self-pay

## 2019-08-12 MED ORDER — HYDROCORTISONE (PERIANAL) 2.5 % EX CREA: TOPICAL_CREAM | Freq: Every evening | CUTANEOUS | 1 refills | Status: DC | PRN

## 2019-08-12 NOTE — Telephone Encounter (Signed)
Hydrocortisone rectal cream refilled as pharmacy requested.

## 2019-09-03 DIAGNOSIS — L814 Other melanin hyperpigmentation: Secondary | ICD-10-CM | POA: Diagnosis not present

## 2019-09-03 DIAGNOSIS — L821 Other seborrheic keratosis: Secondary | ICD-10-CM | POA: Diagnosis not present

## 2019-09-03 DIAGNOSIS — D485 Neoplasm of uncertain behavior of skin: Secondary | ICD-10-CM | POA: Diagnosis not present

## 2019-09-03 DIAGNOSIS — L578 Other skin changes due to chronic exposure to nonionizing radiation: Secondary | ICD-10-CM | POA: Diagnosis not present

## 2019-09-03 DIAGNOSIS — D1801 Hemangioma of skin and subcutaneous tissue: Secondary | ICD-10-CM | POA: Diagnosis not present

## 2019-09-24 ENCOUNTER — Other Ambulatory Visit: Payer: Self-pay | Admitting: Cardiovascular Disease

## 2019-10-19 ENCOUNTER — Other Ambulatory Visit: Payer: Self-pay | Admitting: Internal Medicine

## 2019-10-21 DIAGNOSIS — I10 Essential (primary) hypertension: Secondary | ICD-10-CM | POA: Diagnosis not present

## 2019-10-21 DIAGNOSIS — E7800 Pure hypercholesterolemia, unspecified: Secondary | ICD-10-CM | POA: Diagnosis not present

## 2019-10-23 DIAGNOSIS — S20411A Abrasion of right back wall of thorax, initial encounter: Secondary | ICD-10-CM | POA: Diagnosis not present

## 2019-10-23 DIAGNOSIS — G2581 Restless legs syndrome: Secondary | ICD-10-CM | POA: Diagnosis not present

## 2019-10-23 DIAGNOSIS — Z0000 Encounter for general adult medical examination without abnormal findings: Secondary | ICD-10-CM | POA: Diagnosis not present

## 2019-10-23 DIAGNOSIS — R0781 Pleurodynia: Secondary | ICD-10-CM | POA: Diagnosis not present

## 2019-11-29 DIAGNOSIS — L820 Inflamed seborrheic keratosis: Secondary | ICD-10-CM | POA: Diagnosis not present

## 2019-11-29 DIAGNOSIS — Z9229 Personal history of other drug therapy: Secondary | ICD-10-CM | POA: Diagnosis not present

## 2019-11-29 DIAGNOSIS — S0091XA Abrasion of unspecified part of head, initial encounter: Secondary | ICD-10-CM | POA: Diagnosis not present

## 2019-11-29 DIAGNOSIS — L603 Nail dystrophy: Secondary | ICD-10-CM | POA: Diagnosis not present

## 2019-12-07 ENCOUNTER — Other Ambulatory Visit: Payer: Self-pay | Admitting: Internal Medicine

## 2020-01-10 DIAGNOSIS — L603 Nail dystrophy: Secondary | ICD-10-CM | POA: Diagnosis not present

## 2020-01-10 DIAGNOSIS — L905 Scar conditions and fibrosis of skin: Secondary | ICD-10-CM | POA: Diagnosis not present

## 2020-01-10 DIAGNOSIS — D1801 Hemangioma of skin and subcutaneous tissue: Secondary | ICD-10-CM | POA: Diagnosis not present

## 2020-01-16 ENCOUNTER — Ambulatory Visit (INDEPENDENT_AMBULATORY_CARE_PROVIDER_SITE_OTHER): Payer: Medicare Other | Admitting: Otolaryngology

## 2020-01-16 ENCOUNTER — Other Ambulatory Visit: Payer: Self-pay

## 2020-01-16 ENCOUNTER — Encounter (INDEPENDENT_AMBULATORY_CARE_PROVIDER_SITE_OTHER): Payer: Self-pay | Admitting: Otolaryngology

## 2020-01-16 VITALS — Temp 96.8°F

## 2020-01-16 DIAGNOSIS — H6123 Impacted cerumen, bilateral: Secondary | ICD-10-CM

## 2020-01-16 DIAGNOSIS — B370 Candidal stomatitis: Secondary | ICD-10-CM | POA: Diagnosis not present

## 2020-01-16 NOTE — Progress Notes (Signed)
HPI: [name] is a 74 y.o. male who returns today for evaluation of throat and ear complaints.  He feels like something is in his throat.  He has mild sore throat.  He also feels like his ears are clogged left side worse than right.  He uses Flonase regularly because of nasal allergies.  He does not use any pulmonary inhalers..  Past Medical History:  Diagnosis Date  . Antiphospholipid antibody syndrome ([place]) 05/22/2012   Found subsequent to recurrent PE 9/13  . Arthritis   . Bladder cancer ([place]) dx'd 2009   surg only  . Chronic anal fissure    w/ recurrence's  . Chronic anxiety   . Depression   . ED (erectile dysfunction)   . Esophageal candidiasis ([place]) 03/14/2017  . Fatty liver   . GERD (gastroesophageal reflux disease)   . Heart murmur   . Hiatal hernia   . History of adenomatous polyp of colon    2005  . History of bladder cancer urologist-  dr grapey   papillary TCC  s/p  turbt  . History of DVT (deep vein thrombosis)    post prostate surgery 2010  right LLE  . History of esophageal stricture    w/ dilation  . History of gastric polyp    benign 2010  . History of prostate cancer urologist- dr grapey/  oncology-  dr Alen Blew   Stage  T1c , Gleason 3+3;   s/p  prostatectomy 03-08-210//  per pt currently PSA nondetectable   . History of pulmonary embolus (PE)    post prostate surgery 2010  and 2013 x2 right side per CT scan (found to have lupus anticoaglated )  . History of TIA (transient ischemic attack)    07/ 2012 noted on scans remote probable tia's in  age 58's  . History of TMJ syndrome   . HTN (hypertension)   . Hyperlipidemia   . Lupus anticoagulant positive    found 2013 when pt had PE  . Moderate aortic regurgitation    with no  stenosis   . OSA on CPAP    per study severe osa 12-14-2010  . Prolapsed internal hemorrhoids, grade 2 03/31/2014  . prostate ca dx'd 2010   surg only  . RLS (restless legs syndrome)   . RLS (restless legs syndrome)   . Sleep  apnea   . Symptomatic PVCs cardiologist-  dr berry   intermittant  . Wears glasses    Past Surgical History:  Procedure Laterality Date  . APPENDECTOMY  age 37   and Left Inguinal Hernia Repair  . CARDIOVASCULAR STRESS TEST  06-30-2011   dr berry   normal nuclear study/  normal LV function and wall motion, ef 56%  . COLONOSCOPY  last one 10-19-2011  . CYSTO/ BLADDER BX/  RETROGRADE PYELOGRAM/  TRANSRECTAL PROSTATE BX'S  07-30-2008  . ESOPHAGOGASTRODUODENOSCOPY  last one 06-27-2011  . EVALUATION UNDER ANESTHESIA WITH FISTULECTOMY N/A 01/14/2016   Procedure: EXAM UNDER ANESTHESIA WITH REPAIR OF PERIRECTAL FISTULA  (LIFT);  Surgeon: Michael Boston, MD;  Location: [facility];  Service: General;  Laterality: N/A;  . LAPAROSCOPIC CHOLECYSTECTOMY  05-25-2007   and Excision cyst back of neck  . ROBOT ASSISTED LAPAROSCOPIC RADICAL PROSTATECTOMY  10-20-2008  . STRABISMUS SURGERY Bilateral age 62  . TONSILLECTOMY  age 89  . TRANSTHORACIC ECHOCARDIOGRAM  03-23-2015   dr berry   grade 1 diastolic dysfunction, ef 70-48%/  mild AV thickened leaflets with no stenosis,  moderate  AR/  mild ascending aorta ilatation3.9cm/  trivial MR and TR  . UPPER GASTROINTESTINAL ENDOSCOPY     Social History   Socioeconomic History  . Marital status: Married    Spouse name: Not on file  . Number of children: 2  . Years of education: Not on file  . Highest education level: Not on file  Occupational History  . Occupation: Art gallery manager  . Occupation: FIELD APPLICATIONS    Employer: [employer]  Tobacco Use  . Smoking status: Current Some Day Smoker    Years: 51.00    Types: Cigars  . Smokeless tobacco: Never Used  . Tobacco comment: 5 cigars /week--/  smoked cigarettes for 10 years quit 1990's  Substance and Sexual Activity  . Alcohol use: Yes    Alcohol/week: 28.0 standard drinks    Types: 14 Glasses of wine, 14 Cans of beer per week    Comment: 2 beers a day  . Drug use: No  . Sexual  activity: Not on file  Other Topics Concern  . Not on file  Social History Narrative   Married to [place] Charlann Lange)   Gets regular exercise.   Daily caffeine- 2 cups daily.   Education: MS Estate manager/land agent   2 kids + grandchildren   Enjoys on track car driving (e.g, [employer])      Social Determinants of Health   Financial Resource Strain:   . Difficulty of Paying Living Expenses:   Food Insecurity:   . Worried About Charity fundraiser in the Last Year:   . Arboriculturist in the Last Year:   Transportation Needs:   . Film/video editor (Medical):   Marland Kitchen Lack of Transportation (Non-Medical):   Physical Activity:   . Days of Exercise per Week:   . Minutes of Exercise per Session:   Stress:   . Feeling of Stress :   Social Connections:   . Frequency of Communication with Friends and Family:   . Frequency of Social Gatherings with Friends and Family:   . Attends Religious Services:   . Active Member of Clubs or Organizations:   . Attends Archivist Meetings:   Marland Kitchen Marital Status:    Family History  Problem Relation Age of Onset  . Hypertension Mother   . Hypertension Father   . Stroke Father   . Colon polyps Father   . Colon cancer Father        questionable  . Diabetes Maternal Grandmother   . Heart disease Maternal Grandmother   . Colon polyps Brother   . Crohn's disease Brother   . Cirrhosis Other        [employer]  . Esophageal cancer Neg Hx   . Rectal cancer Neg Hx   . Stomach cancer Neg Hx    Allergies  Allergen Reactions  . Aspirin Other (See Comments)    REACTION: gi upset with higher dose  . Penicillins Itching   Prior to Admission medications   Medication Sig Start Date End Date Taking? Authorizing Provider  co-enzyme Q-10 30 MG capsule Take 30 mg by mouth every evening.    Yes [provider]  CRESTOR 5 MG tablet TAKE 1 TABLET BY MOUTH EVERY DAY Patient taking differently: TAKE 1 TABLET BY MOUTH EVERY DAY-- takes in am 02/23/15   Yes [name], MD  fish oil-omega-3 fatty acids 1000 MG capsule Take 1 g by mouth daily.    Yes [provider]  fluticasone (FLONASE) 50  MCG/ACT nasal spray 2 sprays at bedtime. 01/14/19  Yes [provider]  hydrochlorothiazide (HYDRODIURIL) 25 MG tablet Take 25 mg by mouth daily.   Yes [provider]  hydrocortisone (ANUSOL-HC) 2.5 % rectal cream PLACE RECTALLY AT BEDTIME AS NEEDED FOR HEMORRHOIDS. 12/09/19  Yes Gatha Mayer, MD  Hypertonic Nasal Wash (SINUS RINSE BOTTLE KIT) PACK Place into the nose at bedtime.     Yes [provider]  methylcellulose oral powder Take 1 packet by mouth 2 (two) times daily.   Yes [provider]  metoprolol tartrate (LOPRESSOR) 100 MG tablet TAKE 1 TABLET BY MOUTH TWICE A DAY 05/22/19  Yes [name], MD  Multiple Vitamin (MULTIVITAMIN) tablet Take 1 tablet by mouth daily.     Yes [provider]  oxymetazoline (AFRIN) 0.05 % nasal spray Place 2 sprays into the nose 2 (two) times daily.     Yes [provider]  pantoprazole (PROTONIX) 40 MG tablet Take 1 tablet (40 mg total) by mouth daily. 05/29/19  Yes Gatha Mayer, MD  ramipril (ALTACE) 10 MG capsule TAKE 1 CAPSULE (10 MG TOTAL) BY MOUTH 2 (TWO) TIMES DAILY 09/25/19  Yes [name], MD  Rivaroxaban (XARELTO) 20 MG TABS Take 20 mg by mouth daily.   Yes [provider]  sertraline (ZOLOFT) 100 MG tablet Take 100 mg by mouth daily.   Yes [provider]  ALPRAZolam Duanne Moron) 0.5 MG tablet Take 1 mg by mouth 2 (two) times daily as needed.  03/26/14   [provider]  sildenafil (VIAGRA) 50 MG tablet Take 100 mg by mouth daily as needed.     [provider]     Positive ROS: Otherwise negative  All other systems have been reviewed and were otherwise negative with the exception of those mentioned in the HPI and as above.  Physical Exam: Constitutional: Alert, well-appearing, no acute  distress Ears: External ears without lesions or tenderness.  He has a moderate amount of wax bilaterally worse on the right side that was cleaned with forceps and curettes.  TMs were clear bilaterally with good mobility on pneumatic otoscopy.  On hearing screening with the 512 1024 tuning fork he heard about the same in both ears with AC > BC bilaterally. Nasal: External nose without lesions. Septum with mild deformity and mild rhinitis.  Both middle meatus regions were clear with no signs of infection with only clear mucus within the nasal cavity..  Oral: Lips and gums without lesions. Tongue was normal to evaluation.  However patient had quite flex and mild erythema of the palatal mucosa consistent with probable thrush.  Apparently patient has had a history of esophageal thrush.  Tonsil regions appear benign bilaterally. Neck: No palpable adenopathy or masses Respiratory: Breathing comfortably  Skin: No facial/neck lesions or rash noted.  Cerumen impaction removal  Date/Time: 01/16/2020 2:43 PM Performed by: Rozetta Nunnery, MD Authorized by: Rozetta Nunnery, MD   Consent:    Consent obtained:  Verbal   Consent given by:  Patient   Risks discussed:  Pain and bleeding Procedure details:    Location:  L ear and R ear   Procedure type: curette and forceps   Post-procedure details:    Inspection:  TM intact and canal normal   Hearing quality:  Improved   Patient tolerance of procedure:  Tolerated well, no immediate complications Comments:     TMs are clear bilaterally.    Assessment: Wax buildup in both  ear canals right side worse than left.  Clear TMs otherwise. Oral thrush  Plan: Prescribed nystatin suspension gargle and swallow 10 cc 3 times daily for the next week. Suggested stopping the Flonase for the next 2 weeks and use of antihistamine such as Claritin Allegra or Zyrtec.  As well as saline nasal irrigation. He will notify us if throat symptoms have not resolved  in the next week.   Radene Journey, MD

## 2020-02-03 ENCOUNTER — Encounter (INDEPENDENT_AMBULATORY_CARE_PROVIDER_SITE_OTHER): Payer: Self-pay | Admitting: Otolaryngology

## 2020-02-03 ENCOUNTER — Other Ambulatory Visit: Payer: Self-pay

## 2020-02-03 ENCOUNTER — Ambulatory Visit (INDEPENDENT_AMBULATORY_CARE_PROVIDER_SITE_OTHER): Payer: Medicare Other | Admitting: Otolaryngology

## 2020-02-03 ENCOUNTER — Other Ambulatory Visit: Payer: Self-pay | Admitting: Internal Medicine

## 2020-02-03 VITALS — Temp 97.2°F

## 2020-02-03 DIAGNOSIS — J310 Chronic rhinitis: Secondary | ICD-10-CM

## 2020-02-03 DIAGNOSIS — R1313 Dysphagia, pharyngeal phase: Secondary | ICD-10-CM

## 2020-02-03 NOTE — Progress Notes (Signed)
HPI: [name] is a 74 y.o. male who returns today for evaluation of oral Candida.  He was seen 2 weeks ago with wax buildup in his ears and was noted to have Candida in his oropharynx.  He was treated with a nystatin suspension.  He still feels like he has stopped between the back of his nose in his throat that he wanted checked he also wanted his throat father down checked..  Past Medical History:  Diagnosis Date  . Antiphospholipid antibody syndrome ([place]) 05/22/2012   Found subsequent to recurrent PE 9/13  . Arthritis   . Bladder cancer ([place]) dx'd 2009   surg only  . Chronic anal fissure    w/ recurrence's  . Chronic anxiety   . Depression   . ED (erectile dysfunction)   . Esophageal candidiasis ([place]) 03/14/2017  . Fatty liver   . GERD (gastroesophageal reflux disease)   . Heart murmur   . Hiatal hernia   . History of adenomatous polyp of colon    2005  . History of bladder cancer urologist-  dr grapey   papillary TCC  s/p  turbt  . History of DVT (deep vein thrombosis)    post prostate surgery 2010  right LLE  . History of esophageal stricture    w/ dilation  . History of gastric polyp    benign 2010  . History of prostate cancer urologist- dr grapey/  oncology-  dr Alen Blew   Stage  T1c , Gleason 3+3;   s/p  prostatectomy 03-08-210//  per pt currently PSA nondetectable   . History of pulmonary embolus (PE)    post prostate surgery 2010  and 2013 x2 right side per CT scan (found to have lupus anticoaglated )  . History of TIA (transient ischemic attack)    07/ 2012 noted on scans remote probable tia's in  age 77's  . History of TMJ syndrome   . HTN (hypertension)   . Hyperlipidemia   . Lupus anticoagulant positive    found 2013 when pt had PE  . Moderate aortic regurgitation    with no  stenosis   . OSA on CPAP    per study severe osa 12-14-2010  . Prolapsed internal hemorrhoids, grade 2 03/31/2014  . prostate ca dx'd 2010   surg only  . RLS (restless legs  syndrome)   . RLS (restless legs syndrome)   . Sleep apnea   . Symptomatic PVCs cardiologist-  dr berry   intermittant  . Wears glasses    Past Surgical History:  Procedure Laterality Date  . APPENDECTOMY  age 89   and Left Inguinal Hernia Repair  . CARDIOVASCULAR STRESS TEST  06-30-2011   dr berry   normal nuclear study/  normal LV function and wall motion, ef 56%  . COLONOSCOPY  last one 10-19-2011  . CYSTO/ BLADDER BX/  RETROGRADE PYELOGRAM/  TRANSRECTAL PROSTATE BX'S  07-30-2008  . ESOPHAGOGASTRODUODENOSCOPY  last one 06-27-2011  . EVALUATION UNDER ANESTHESIA WITH FISTULECTOMY N/A 01/14/2016   Procedure: EXAM UNDER ANESTHESIA WITH REPAIR OF PERIRECTAL FISTULA  (LIFT);  Surgeon: Michael Boston, MD;  Location: [facility];  Service: General;  Laterality: N/A;  . LAPAROSCOPIC CHOLECYSTECTOMY  05-25-2007   and Excision cyst back of neck  . ROBOT ASSISTED LAPAROSCOPIC RADICAL PROSTATECTOMY  10-20-2008  . STRABISMUS SURGERY Bilateral age 89  . TONSILLECTOMY  age 87  . TRANSTHORACIC ECHOCARDIOGRAM  03-23-2015   dr berry   grade 1 diastolic dysfunction, ef  55-60%/  mild AV thickened leaflets with no stenosis,  moderate AR/  mild ascending aorta ilatation3.9cm/  trivial MR and TR  . UPPER GASTROINTESTINAL ENDOSCOPY     Social History   Socioeconomic History  . Marital status: Married    Spouse name: Not on file  . Number of children: 2  . Years of education: Not on file  . Highest education level: Not on file  Occupational History  . Occupation: Art gallery manager  . Occupation: FIELD APPLICATIONS    Employer: [employer]  Tobacco Use  . Smoking status: Current Some Day Smoker    Years: 51.00    Types: Cigars  . Smokeless tobacco: Never Used  . Tobacco comment: 5 cigars /week--/  smoked cigarettes for 10 years quit 1990's  Vaping Use  . Vaping Use: Never used  Substance and Sexual Activity  . Alcohol use: Yes    Alcohol/week: 28.0 standard drinks    Types: 14  Glasses of wine, 14 Cans of beer per week    Comment: 2 beers a day  . Drug use: No  . Sexual activity: Not on file  Other Topics Concern  . Not on file  Social History Narrative   Married to [place] Charlann Lange)   Gets regular exercise.   Daily caffeine- 2 cups daily.   Education: MS Estate manager/land agent   2 kids + grandchildren   Enjoys on track car driving (e.g, [employer])      Social Determinants of Health   Financial Resource Strain:   . Difficulty of Paying Living Expenses:   Food Insecurity:   . Worried About Charity fundraiser in the Last Year:   . Arboriculturist in the Last Year:   Transportation Needs:   . Film/video editor (Medical):   Marland Kitchen Lack of Transportation (Non-Medical):   Physical Activity:   . Days of Exercise per Week:   . Minutes of Exercise per Session:   Stress:   . Feeling of Stress :   Social Connections:   . Frequency of Communication with Friends and Family:   . Frequency of Social Gatherings with Friends and Family:   . Attends Religious Services:   . Active Member of Clubs or Organizations:   . Attends Archivist Meetings:   Marland Kitchen Marital Status:    Family History  Problem Relation Age of Onset  . Hypertension Mother   . Hypertension Father   . Stroke Father   . Colon polyps Father   . Colon cancer Father        questionable  . Diabetes Maternal Grandmother   . Heart disease Maternal Grandmother   . Colon polyps Brother   . Crohn's disease Brother   . Cirrhosis Other        [employer]  . Esophageal cancer Neg Hx   . Rectal cancer Neg Hx   . Stomach cancer Neg Hx    Allergies  Allergen Reactions  . Aspirin Other (See Comments)    REACTION: gi upset with higher dose  . Penicillins Itching   Prior to Admission medications   Medication Sig Start Date End Date Taking? Authorizing Provider  co-enzyme Q-10 30 MG capsule Take 30 mg by mouth every evening.    Yes [provider]  CRESTOR 5 MG tablet TAKE 1 TABLET BY  MOUTH EVERY DAY Patient taking differently: TAKE 1 TABLET BY MOUTH EVERY DAY-- takes in am 02/23/15  Yes [name], MD  fish oil-omega-3 fatty acids  1000 MG capsule Take 1 g by mouth daily.    Yes [provider]  fluticasone (FLONASE) 50 MCG/ACT nasal spray 2 sprays at bedtime. 01/14/19  Yes [provider]  hydrochlorothiazide (HYDRODIURIL) 25 MG tablet Take 25 mg by mouth daily.   Yes [provider]  hydrocortisone (ANUSOL-HC) 2.5 % rectal cream PLACE RECTALLY AT BEDTIME AS NEEDED FOR HEMORRHOIDS. 02/03/20  Yes Gatha Mayer, MD  Hypertonic Nasal Wash (SINUS RINSE BOTTLE KIT) PACK Place into the nose at bedtime.     Yes [provider]  methylcellulose oral powder Take 1 packet by mouth 2 (two) times daily.   Yes [provider]  metoprolol tartrate (LOPRESSOR) 100 MG tablet TAKE 1 TABLET BY MOUTH TWICE A DAY 05/22/19  Yes [name], MD  Multiple Vitamin (MULTIVITAMIN) tablet Take 1 tablet by mouth daily.     Yes [provider]  oxymetazoline (AFRIN) 0.05 % nasal spray Place 2 sprays into the nose 2 (two) times daily.     Yes [provider]  pantoprazole (PROTONIX) 40 MG tablet Take 1 tablet (40 mg total) by mouth daily. 05/29/19  Yes Gatha Mayer, MD  ramipril (ALTACE) 10 MG capsule TAKE 1 CAPSULE (10 MG TOTAL) BY MOUTH 2 (TWO) TIMES DAILY 09/25/19  Yes [name], MD  Rivaroxaban (XARELTO) 20 MG TABS Take 20 mg by mouth daily.   Yes [provider]  sertraline (ZOLOFT) 100 MG tablet Take 100 mg by mouth daily.   Yes [provider]  ALPRAZolam Duanne Moron) 0.5 MG tablet Take 1 mg by mouth 2 (two) times daily as needed.  03/26/14   [provider]  sildenafil (VIAGRA) 50 MG tablet Take 100 mg by mouth daily as needed.     [provider]     Positive ROS: Otherwise negative  All other systems have been reviewed and were otherwise negative with the exception of those  mentioned in the HPI and as above.  Physical Exam: Constitutional: Alert, well-appearing, no acute distress Ears: External ears without lesions or tenderness. Ear canals are clear bilaterally with intact, clear TMs.  Nasal: External nose without lesions. Septum deviated to the right. Clear nasal passages otherwise. Oral: Lips and gums without lesions. Tongue and palate mucosa without lesions. Posterior oropharynx clear.  No evidence of Candida with clear mucous membranes throughout. Fiberoptic laryngoscopy was performed to the right and left nostril.  Posterior nasal cavity and nasopharynx was clear.  Base of tongue epiglottis hypopharynx and larynx was clear with no evidence of Candida or abnormal lesions. Neck: No palpable adenopathy or masses Respiratory: Breathing comfortably  Skin: No facial/neck lesions or rash noted.  Laryngoscopy  Date/Time: 02/03/2020 4:42 PM Performed by: Rozetta Nunnery, MD Authorized by: Rozetta Nunnery, MD   Consent:    Consent obtained:  Verbal   Consent given by:  Patient Procedure details:    Indications: direct visualization of the upper aerodigestive tract     Medication:  Afrin   Instrument: flexible fiberoptic laryngoscope     Scope location: bilateral nare   Sinus:    Right nasopharynx: normal     Left nasopharynx: normal   Mouth:    Vallecula: normal     Base of tongue: normal     Epiglottis: normal   Throat:    Right hypopharynx: normal     Left hypopharynx: normal     True vocal cords: normal   Comments:     On fiberoptic  laryngoscopy nasopharynx as well as the hypopharynx and larynx was clear to evaluation.    Assessment: Resolved Candida on upper airway examination. Chronic rhinitis with septal deviation Globus type symptoms  Plan: Recommended use of saline nasal irrigation which should help with the sensation of mucus between his nose and throat. He will follow-up as needed.   Radene Journey, MD

## 2020-02-21 DIAGNOSIS — H25013 Cortical age-related cataract, bilateral: Secondary | ICD-10-CM | POA: Diagnosis not present

## 2020-02-21 DIAGNOSIS — H16223 Keratoconjunctivitis sicca, not specified as Sjogren's, bilateral: Secondary | ICD-10-CM | POA: Diagnosis not present

## 2020-02-21 DIAGNOSIS — H2513 Age-related nuclear cataract, bilateral: Secondary | ICD-10-CM | POA: Diagnosis not present

## 2020-02-21 DIAGNOSIS — H25043 Posterior subcapsular polar age-related cataract, bilateral: Secondary | ICD-10-CM | POA: Diagnosis not present

## 2020-03-07 ENCOUNTER — Other Ambulatory Visit: Payer: Self-pay | Admitting: Internal Medicine

## 2020-03-18 ENCOUNTER — Telehealth: Payer: Self-pay

## 2020-03-18 ENCOUNTER — Ambulatory Visit: Payer: Medicare Other | Admitting: Internal Medicine

## 2020-03-18 VITALS — BP 124/64 | HR 67 | Ht 71.0 in | Wt 216.0 lb

## 2020-03-18 DIAGNOSIS — Z7901 Long term (current) use of anticoagulants: Secondary | ICD-10-CM | POA: Diagnosis not present

## 2020-03-18 DIAGNOSIS — R1310 Dysphagia, unspecified: Secondary | ICD-10-CM

## 2020-03-18 DIAGNOSIS — R198 Other specified symptoms and signs involving the digestive system and abdomen: Secondary | ICD-10-CM | POA: Diagnosis not present

## 2020-03-18 DIAGNOSIS — K5902 Outlet dysfunction constipation: Secondary | ICD-10-CM

## 2020-03-18 DIAGNOSIS — D6861 Antiphospholipid syndrome: Secondary | ICD-10-CM | POA: Diagnosis not present

## 2020-03-18 DIAGNOSIS — B3781 Candidal esophagitis: Secondary | ICD-10-CM | POA: Diagnosis not present

## 2020-03-18 NOTE — Telephone Encounter (Signed)
Pharm please address xarelto for PE, I could not tell if we prescribe or PCP thanks.

## 2020-03-18 NOTE — Telephone Encounter (Signed)
Patient with diagnosis of PE/DVT post prostectomy on Xarelto for anticoagulation.    Per patient he was positive for lupus anticoagulant  Procedure: EGD Date of procedure: 03/30/20  CrCl 27ml/min  Per office protocol, patient can hold Xarelto for 1 day prior to procedure.

## 2020-03-18 NOTE — Progress Notes (Signed)
[name] 73 y.o. July 03, 1946 [id number]  Assessment & Plan:   Encounter Diagnoses  Name Primary?   Odynophagia Yes   Abnormal defecation    Antiphospholipid antibody syndrome (HCC)    Esophageal candidiasis (HCC)    Long term current use of anticoagulant    Constipation due to outlet dysfunction      Evaluate with EGD.  He could have recurrent candidiasis.  HIV testing was negative when I found that before.  May need some work-up to see why he gets this if so.  Hold Xarelto 1 to 2 days prior to procedure.  Will double check with prescriber.  Rare but real risk of clot off of that explained and patient accepts that risk.  Other routine endoscopy risks reviewed as well.  Refer to pelvic PT for bowel issues.  I appreciate the opportunity to care for this patient. CC: Andrew Pretty, MD  Subjective:   Chief Complaint:  HPI [name] is here complaining of painful swallowing.  Symptoms began about 5 weeks ago.  He saw [name] of ENT for ear cerumen issues in early June and then follow-up with chronic rhinitis in late June, and had thrush and was given a swish and swallow treatment.  He has had esophageal candidiasis in the past when I scoped him in 2018.  Symptoms seem similar to that also having some epigastric pain and burning in the chest.  Also having difficulty with defecation, had a colonoscopy in December 2020 with 2 diminutive adenomas and diverticulosis and he has a history of hemorrhoids as well.  He wants me to "look in there make sure he does not have cancer".  He says he has to take a glycerin suppository to defecate.  Went through this at 1 point years ago for a number of years but did well for a while.  Now the problem has returned.  Also some left lower quadrant discomfort increased gas bloating.  Feels distended.  Wt Readings from Last 3 Encounters:  03/18/20 216 lb (98 kg)  08/02/19 215 lb 12.8 oz (97.9 kg)  07/17/19 213 lb (96.6 kg)   Tried to  stop his PPI a few months ago to get off medicine could not tolerate that.  Has been back on for a while. Allergies  Allergen Reactions   Aspirin Other (See Comments)    REACTION: gi upset with higher dose   Penicillins Itching   Current Meds  Medication Sig   co-enzyme Q-10 30 MG capsule Take 30 mg by mouth every evening.    CRESTOR 5 MG tablet TAKE 1 TABLET BY MOUTH EVERY DAY (Patient taking differently: TAKE 1 TABLET BY MOUTH EVERY DAY-- takes in am)   fish oil-omega-3 fatty acids 1000 MG capsule Take 1 g by mouth daily.    fluticasone (FLONASE) 50 MCG/ACT nasal spray 2 sprays at bedtime.   hydrochlorothiazide (HYDRODIURIL) 25 MG tablet Take 25 mg by mouth daily.   hydrocortisone (ANUSOL-HC) 2.5 % rectal cream PLACE RECTALLY AT BEDTIME AS NEEDED FOR HEMORRHOIDS.   Hypertonic Nasal Wash (SINUS RINSE BOTTLE KIT) PACK Place into the nose at bedtime.     methylcellulose oral powder Take 1 packet by mouth 2 (two) times daily.   metoprolol tartrate (LOPRESSOR) 100 MG tablet TAKE 1 TABLET BY MOUTH TWICE A DAY   Multiple Vitamin (MULTIVITAMIN) tablet Take 1 tablet by mouth daily.     oxymetazoline (AFRIN) 0.05 % nasal spray Place 2 sprays into the nose 2 (two) times daily.  pantoprazole (PROTONIX) 40 MG tablet Take 1 tablet (40 mg total) by mouth daily.   Rivaroxaban (XARELTO) 20 MG TABS Take 20 mg by mouth daily.   sertraline (ZOLOFT) 100 MG tablet Take 100 mg by mouth daily.   [DISCONTINUED] ramipril (ALTACE) 10 MG capsule TAKE 1 CAPSULE (10 MG TOTAL) BY MOUTH 2 (TWO) TIMES DAILY   Past Medical History:  Diagnosis Date   Antiphospholipid antibody syndrome ([place]) 05/22/2012   Found subsequent to recurrent PE 9/13   Arthritis    Bladder cancer ([place]) dx'd 2009   surg only   Chronic anal fissure    w/ recurrence's   Chronic anxiety    Depression    ED (erectile dysfunction)    Esophageal candidiasis ([place]) 03/14/2017   Fatty liver    GERD (gastroesophageal  reflux disease)    Heart murmur    Hiatal hernia    History of adenomatous polyp of colon    2005   History of bladder cancer urologist-  Andrew [name]   papillary TCC  s/p  turbt   History of DVT (deep vein thrombosis)    post prostate surgery 2010  right LLE   History of esophageal stricture    w/ dilation   History of gastric polyp    benign 2010   History of prostate cancer urologist- Andrew [name]/  oncology-  Andrew [name]   Stage  T1c , Gleason 3+3;   s/p  prostatectomy 03-08-210//  per pt currently PSA nondetectable    History of pulmonary embolus (PE)    post prostate surgery 2010  and 2013 x2 right side per CT scan (found to have lupus anticoaglated )   History of TIA (transient ischemic attack)    07/ 2012 noted on scans remote probable tia's in  age 36's   History of TMJ syndrome    HTN (hypertension)    Hyperlipidemia    Lupus anticoagulant positive    found 2013 when pt had PE   Moderate aortic regurgitation    with no  stenosis    OSA on CPAP    per study severe osa 12-14-2010   Prolapsed internal hemorrhoids, grade 2 03/31/2014   prostate ca dx'd 2010   surg only   RLS (restless legs syndrome)    RLS (restless legs syndrome)    Sleep apnea    Symptomatic PVCs cardiologist-  [name]   intermittant   Wears glasses    Past Surgical History:  Procedure Laterality Date   APPENDECTOMY  age 89   and Left Inguinal Hernia Repair   CARDIOVASCULAR STRESS TEST  06-30-2011   [name]   normal nuclear study/  normal LV function and wall motion, ef 56%   COLONOSCOPY  last one 10-19-2011   CYSTO/ BLADDER BX/  RETROGRADE PYELOGRAM/  TRANSRECTAL PROSTATE BX'S  07-30-2008   ESOPHAGOGASTRODUODENOSCOPY  last one 06-27-2011   EVALUATION UNDER ANESTHESIA WITH FISTULECTOMY N/A 01/14/2016   Procedure: EXAM UNDER ANESTHESIA WITH REPAIR OF PERIRECTAL FISTULA  (LIFT);  Surgeon: Andrew Boston, MD;  Location: [facility];  Service: General;  Laterality:  N/A;   LAPAROSCOPIC CHOLECYSTECTOMY  05-25-2007   and Excision cyst back of neck   ROBOT ASSISTED LAPAROSCOPIC RADICAL PROSTATECTOMY  10-20-2008   STRABISMUS SURGERY Bilateral age 77   TONSILLECTOMY  age 44   TRANSTHORACIC ECHOCARDIOGRAM  03-23-2015   [name]   grade 1 diastolic dysfunction, ef 72-53%/  mild AV thickened leaflets with no stenosis,  moderate AR/  mild  ascending aorta ilatation3.9cm/  trivial MR and TR   UPPER GASTROINTESTINAL ENDOSCOPY     Social History   Social History Narrative   Married to [place] ([place])   Gets regular exercise.   Daily caffeine- 2 cups daily.   Education: MS Estate manager/land agent   2 kids + grandchildren   Enjoys on track car driving (e.g, [employer])      family history includes Cirrhosis in an other family member; Colon cancer in his father; Colon polyps in his brother and father; Crohn's disease in his brother; Diabetes in his maternal grandmother; Heart disease in his maternal grandmother; Hypertension in his father and mother; Stroke in his father.   Review of Systems  As per HPI Objective:   Physical Exam BP 124/64    Pulse 67    Ht 5' 11"  (1.803 m)    Wt 216 lb (98 kg)    BMI 30.13 kg/m  No acute distress well-developed well-nourished white man Abdomen is soft and nontender except for mild left lower quadrant discomfort to deep palpation There is a tan coat on the tongue but I do not see any thrush in oral cavity or pharynx  Rectal exam shows some scarring from prior fistula surgery.  Anus is slightly deformed because of that.  Anal tone is probably slightly increased.  Effective squeeze present.  Simulated defecation shows appropriate abdominal contraction and what seems to be normal descent.  No rectal lesions or mass.  Prostate surgically absent.

## 2020-03-18 NOTE — Patient Instructions (Signed)
You have been scheduled for an endoscopy. Please follow written instructions given to you at your visit today. If you use inhalers (even only as needed), please bring them with you on the day of your procedure.    You will be contacted by our office prior to your procedure for directions on holding your Xarelto.  If you do not hear from our office 1 week prior to your scheduled procedure, please call [phone] to discuss.    We are referring you for pelvic floor P.T. They will contact you about setting up an appointment.     I appreciate the opportunity to care for you. Silvano Rusk, MD, [facility]

## 2020-03-18 NOTE — Telephone Encounter (Signed)
[facility] Pre-operative Risk Assessment     Request for surgical clearance:     Endoscopy Procedure  What type of surgery is being performed?     EGD  When is this surgery scheduled?     03/30/2020  What type of clearance is required ?   Pharmacy  Are there any medications that need to be held prior to surgery and how long? Xarelto 1-2 days  Practice name and name of physician performing surgery?      [facility]  What is your office phone and fax number?      Phone- [phone]  [phone]  Anesthesia type (None, local, MAC, general) ?       MAC

## 2020-03-19 ENCOUNTER — Encounter: Payer: Self-pay | Admitting: Internal Medicine

## 2020-03-19 ENCOUNTER — Other Ambulatory Visit (INDEPENDENT_AMBULATORY_CARE_PROVIDER_SITE_OTHER): Payer: Self-pay | Admitting: Otolaryngology

## 2020-03-19 ENCOUNTER — Other Ambulatory Visit: Payer: Self-pay | Admitting: Cardiovascular Disease

## 2020-03-19 NOTE — Telephone Encounter (Signed)
Patient informed and verbalized understanding

## 2020-03-20 ENCOUNTER — Encounter: Payer: Self-pay | Admitting: Internal Medicine

## 2020-03-20 DIAGNOSIS — Z8551 Personal history of malignant neoplasm of bladder: Secondary | ICD-10-CM | POA: Diagnosis not present

## 2020-03-30 ENCOUNTER — Ambulatory Visit (AMBULATORY_SURGERY_CENTER): Payer: Medicare Other | Admitting: Internal Medicine

## 2020-03-30 ENCOUNTER — Encounter: Payer: Self-pay | Admitting: Internal Medicine

## 2020-03-30 ENCOUNTER — Other Ambulatory Visit: Payer: Self-pay

## 2020-03-30 VITALS — BP 131/60 | HR 56 | Temp 97.0°F | Resp 13 | Ht 71.0 in | Wt 216.0 lb

## 2020-03-30 DIAGNOSIS — K229 Disease of esophagus, unspecified: Secondary | ICD-10-CM | POA: Diagnosis not present

## 2020-03-30 DIAGNOSIS — K259 Gastric ulcer, unspecified as acute or chronic, without hemorrhage or perforation: Secondary | ICD-10-CM | POA: Diagnosis not present

## 2020-03-30 DIAGNOSIS — R1310 Dysphagia, unspecified: Secondary | ICD-10-CM

## 2020-03-30 MED ORDER — SODIUM CHLORIDE 0.9 % IV SOLN
500.0000 mL | Freq: Once | INTRAVENOUS | Status: DC
Start: 2020-03-30 — End: 2020-03-30

## 2020-03-30 NOTE — Progress Notes (Signed)
Pt's states no medical or surgical changes since previsit or office visit.  Vitals- Courtney 

## 2020-03-30 NOTE — Op Note (Signed)
[facility] Patient Name: [name] Procedure Date: 03/30/2020 11:06 AM MRN: [id number] Endoscopist: Gatha Mayer , MD Age: 74 Referring MD:  Date of Birth: 27-Sep-1945 Gender: Male Account #: [id number] Procedure:                Upper GI endoscopy Indications:              Odynophagia Medicines:                Propofol per Anesthesia, Monitored Anesthesia Care Procedure:                Pre-Anesthesia Assessment:                           - Prior to the procedure, a History and Physical                            was performed, and patient medications and                            allergies were reviewed. The patient's tolerance of                            previous anesthesia was also reviewed. The risks                            and benefits of the procedure and the sedation                            options and risks were discussed with the patient.                            All questions were answered, and informed consent                            was obtained. Prior Anticoagulants: The patient                            last took Xarelto (rivaroxaban) 2 days prior to the                            procedure. ASA Grade Assessment: III - A patient                            with severe systemic disease. After reviewing the                            risks and benefits, the patient was deemed in                            satisfactory condition to undergo the procedure.                           After obtaining informed consent, the endoscope was  passed under direct vision. Throughout the                            procedure, the patient's blood pressure, pulse, and                            oxygen saturations were monitored continuously. The                            Endoscope was introduced through the mouth, and                            advanced to the second part of duodenum. The upper                            GI endoscopy  was accomplished without difficulty.                            The patient tolerated the procedure well. Scope In: Scope Out: Findings:                 Patchy mucosal changes characterized by white                            specks were found in the upper third of the                            esophagus and in the middle third of the esophagus.                            Biopsies were taken with a cold forceps for                            histology. Verification of patient identification                            for the specimen was done. Estimated blood loss was                            minimal.                           Localized mucosal changes were found in the upper                            third of the esophagus. Biopsies were taken with a                            cold forceps for histology. Verification of patient                            identification for the specimen was done. Estimated  blood loss was minimal.                           Abnormal motility was noted in the esophagus. There                            are extra peristaltic waves in the esophageal body.                           One non-bleeding superficial gastric ulcer with no                            stigmata of bleeding was found in the gastric                            antrum. The lesion was 3 mm in largest dimension.                            Biopsies were taken with a cold forceps for                            histology. Verification of patient identification                            for the specimen was done. Estimated blood loss was                            minimal.                           The exam was otherwise without abnormality.                           The cardia and gastric fundus were normal on                            retroflexion. Complications:            No immediate complications. Estimated Blood Loss:     Estimated blood loss was  minimal. Impression:               - White specked mucosa in the esophagus. Biopsied.                           - Mucosal changes in the esophagus. Biopsied.                           - Abnormal esophageal motility?                           - Non-bleeding gastric ulcer with no stigmata of                            bleeding. Biopsied.                           -  The examination was otherwise normal. Recommendation:           - Patient has a contact number available for                            emergencies. The signs and symptoms of potential                            delayed complications were discussed with the                            patient. Return to normal activities tomorrow.                            Written discharge instructions were provided to the                            patient.                           - Resume previous diet.                           - Continue present medications.                           - Resume Xarelto (rivaroxaban) at prior dose                            tomorrow.                           - Await pathology results.                           - ? needs manometry Gatha Mayer, MD 03/30/2020 11:30:24 AM This report has been signed electronically.

## 2020-03-30 NOTE — Patient Instructions (Addendum)
Nothing bad going on here.  1) A few white spots in the esophagus - could be Candida but not enough to be an issue as far as your symptoms, I think. I did take biopsies to know for sure.  2) a small inlet patch in the esophagus - not a problem - biopsied to be sure.  3) small tiny stomach ulcer - doubt it is making you feel bad and very superficial - biopsied.  Nothing that looks like cancer!  I am somewhat suspicious of esophageal dysmotility - spasm of the esophagus - and think this might be causing problems. There is a test for that called esophageal manometry - may need to do it but we should wait for biopsy results first. I will call you.  I appreciate the opportunity to care for you. Gatha Mayer, MD, [facility]  Resume Xarelto at prior dose tommorrow   YOU HAD AN ENDOSCOPIC PROCEDURE TODAY AT [facility]:   Refer to the procedure report that was given to you for any specific questions about what was found during the examination.  If the procedure report does not answer your questions, please call your gastroenterologist to clarify.  If you requested that your care partner not be given the details of your procedure findings, then the procedure report has been included in a sealed envelope for you to review at your convenience later.  YOU SHOULD EXPECT: Some feelings of bloating in the abdomen. Passage of more gas than usual.  Walking can help get rid of the air that was put into your GI tract during the procedure and reduce the bloating. If you had a lower endoscopy (such as a colonoscopy or flexible sigmoidoscopy) you may notice spotting of blood in your stool or on the toilet paper. If you underwent a bowel prep for your procedure, you may not have a normal bowel movement for a few days.  Please Note:  You might notice some irritation and congestion in your nose or some drainage.  This is from the oxygen used during your procedure.  There is no need for concern and it  should clear up in a day or so.  SYMPTOMS TO REPORT IMMEDIATELY:    Following upper endoscopy (EGD)  Vomiting of blood or coffee ground material  New chest pain or pain under the shoulder blades  Painful or persistently difficult swallowing  New shortness of breath  Fever of 100F or higher  Black, tarry-looking stools  For urgent or emergent issues, a gastroenterologist can be reached at any hour by calling [phone]. Do not use MyChart messaging for urgent concerns.    DIET:  We do recommend a small meal at first, but then you may proceed to your regular diet.  Drink plenty of fluids but you should avoid alcoholic beverages for 24 hours.  ACTIVITY:  You should plan to take it easy for the rest of today and you should NOT DRIVE or use heavy machinery until tomorrow (because of the sedation medicines used during the test).    FOLLOW UP: Our staff will call the number listed on your records 48-72 hours following your procedure to check on you and address any questions or concerns that you may have regarding the information given to you following your procedure. If we do not reach you, we will leave a message.  We will attempt to reach you two times.  During this call, we will ask if you have developed any symptoms of COVID 19.  If you develop any symptoms (ie: fever, flu-like symptoms, shortness of breath, cough etc.) before then, please call [phone].  If you test positive for Covid 19 in the 2 weeks post procedure, please call and report this information to [place].    If any biopsies were taken you will be contacted by phone or by letter within the next 1-3 weeks.  Please call us at [phone] if you have not heard about the biopsies in 3 weeks.    SIGNATURES/CONFIDENTIALITY: You and/or your care partner have signed paperwork which will be entered into your electronic medical record.  These signatures attest to the fact that that the information above on your After Visit  Summary has been reviewed and is understood.  Full responsibility of the confidentiality of this discharge information lies with you and/or your care-partner.

## 2020-03-30 NOTE — Progress Notes (Signed)
Robinul 0.1 mg IV given due large amount of secretions upon assessment.  MD made aware, vss 

## 2020-03-30 NOTE — Progress Notes (Signed)
Called to room to assist during endoscopic procedure.  Patient ID and intended procedure confirmed with present staff. Received instructions for my participation in the procedure from the performing physician.  

## 2020-03-30 NOTE — Progress Notes (Signed)
Report given to PACU, vss 

## 2020-04-01 ENCOUNTER — Telehealth: Payer: Self-pay

## 2020-04-01 NOTE — Telephone Encounter (Signed)
Left message on answering machine. 

## 2020-04-15 ENCOUNTER — Encounter (HOSPITAL_COMMUNITY): Payer: Self-pay | Admitting: Cardiovascular Disease

## 2020-04-21 DIAGNOSIS — G4733 Obstructive sleep apnea (adult) (pediatric): Secondary | ICD-10-CM | POA: Diagnosis not present

## 2020-05-01 ENCOUNTER — Ambulatory Visit (HOSPITAL_COMMUNITY): Payer: Medicare Other | Attending: Cardiology

## 2020-05-01 ENCOUNTER — Other Ambulatory Visit: Payer: Self-pay

## 2020-05-01 DIAGNOSIS — I351 Nonrheumatic aortic (valve) insufficiency: Secondary | ICD-10-CM | POA: Diagnosis not present

## 2020-05-01 LAB — ECHOCARDIOGRAM COMPLETE
Area-P 1/2: 2.71 cm2
P 1/2 time: 409 msec
S' Lateral: 3.7 cm

## 2020-05-04 ENCOUNTER — Other Ambulatory Visit: Payer: Self-pay | Admitting: *Deleted

## 2020-05-04 DIAGNOSIS — I712 Thoracic aortic aneurysm, without rupture, unspecified: Secondary | ICD-10-CM

## 2020-05-06 ENCOUNTER — Ambulatory Visit: Payer: Medicare Other | Attending: Internal Medicine | Admitting: Physical Therapy

## 2020-05-06 ENCOUNTER — Other Ambulatory Visit: Payer: Self-pay

## 2020-05-06 ENCOUNTER — Encounter: Payer: Self-pay | Admitting: Physical Therapy

## 2020-05-06 DIAGNOSIS — R252 Cramp and spasm: Secondary | ICD-10-CM | POA: Insufficient documentation

## 2020-05-06 DIAGNOSIS — R278 Other lack of coordination: Secondary | ICD-10-CM | POA: Diagnosis not present

## 2020-05-06 NOTE — Patient Instructions (Signed)
Abdominal massage from inside Rt pelvic bone, up to ribcage, across to Lt ribcage, down to inside of Lt pelvic bone.  Circles or kneading with hand over hand technique, pressure as tolerated.  3-5 min daily.  In shower or laying on back or Lt side.  Can use vibration or tapping for gas pain/bubbles.  Deep breathing into pelvic bowl and abdomen (beach ball) with visual of dropping/letting go of anus and anal area.  Inhale on a slow 5 count and exhale on slow 5 count.  Picture how a good inhale can help drop the pelvic floor (a softening of the pucker).  3-5 min daily.  Visualization throughout the day as a self-check for gripping.  Feel anus when seated on toilet and ask yourself if you are tightening or bulging (what we want) when pushing to have a bowel movement.

## 2020-05-06 NOTE — Therapy (Signed)
[facility] [address], [place], [place], [address] Phone: [phone]   Fax:  [phone]  Physical Therapy Evaluation  Patient Details  Name: [name] MRN: [id number] Date of Birth: 1946-07-29 Referring Provider (PT): Gatha Mayer, MD   Encounter Date: 05/06/2020   PT End of Session - 05/06/20 1315    Visit Number 1    Date for PT Re-Evaluation 07/29/20    Authorization Type UHC Medicare    Progress Note Due on Visit 10    PT Start Time 1100    PT Stop Time 1145    PT Time Calculation (min) 45 min    Activity Tolerance Patient tolerated treatment well    Behavior During Therapy [facility] for tasks assessed/performed           Past Medical History:  Diagnosis Date  . Antiphospholipid antibody syndrome ([place]) 05/22/2012   Found subsequent to recurrent PE 9/13  . Arthritis   . Bladder cancer ([place]) dx'd 2009   surg only  . Chronic anal fissure    w/ recurrence's  . Chronic anxiety   . Depression   . ED (erectile dysfunction)   . Esophageal candidiasis ([place]) 03/14/2017  . Fatty liver   . GERD (gastroesophageal reflux disease)   . Heart murmur   . Hiatal hernia   . History of adenomatous polyp of colon    2005  . History of bladder cancer urologist-  dr grapey   papillary TCC  s/p  turbt  . History of DVT (deep vein thrombosis)    post prostate surgery 2010  right LLE  . History of esophageal stricture    w/ dilation  . History of gastric polyp    benign 2010  . History of prostate cancer urologist- dr grapey/  oncology-  dr Alen Blew   Stage  T1c , Gleason 3+3;   s/p  prostatectomy 03-08-210//  per pt currently PSA nondetectable   . History of pulmonary embolus (PE)    post prostate surgery 2010  and 2013 x2 right side per CT scan (found to have lupus anticoaglated )  . History of TIA (transient ischemic attack)    07/ 2012 noted on scans remote probable tia's in  age 60's  . History of TMJ  syndrome   . HTN (hypertension)   . Hyperlipidemia   . Lupus anticoagulant positive    found 2013 when pt had PE  . Moderate aortic regurgitation    with no  stenosis   . OSA on CPAP    per study severe osa 12-14-2010  . Prolapsed internal hemorrhoids, grade 2 03/31/2014  . prostate ca dx'd 2010   surg only  . RLS (restless legs syndrome)   . RLS (restless legs syndrome)   . Sleep apnea   . Symptomatic PVCs cardiologist-  dr berry   intermittant  . Wears glasses     Past Surgical History:  Procedure Laterality Date  . APPENDECTOMY  age 74   and Left Inguinal Hernia Repair  . CARDIOVASCULAR STRESS TEST  06-30-2011   dr berry   normal nuclear study/  normal LV function and wall motion, ef 56%  . COLONOSCOPY  last one 10-19-2011  . CYSTO/ BLADDER BX/  RETROGRADE PYELOGRAM/  TRANSRECTAL PROSTATE BX'S  07-30-2008  . ESOPHAGOGASTRODUODENOSCOPY  last one 06-27-2011  . EVALUATION UNDER ANESTHESIA WITH FISTULECTOMY N/A 01/14/2016   Procedure: EXAM UNDER ANESTHESIA WITH REPAIR OF PERIRECTAL FISTULA  (LIFT);  Surgeon: Remo Lipps  Gross, MD;  Location: [place];  Service: General;  Laterality: N/A;  . LAPAROSCOPIC CHOLECYSTECTOMY  05-25-2007   and Excision cyst back of neck  . ROBOT ASSISTED LAPAROSCOPIC RADICAL PROSTATECTOMY  10-20-2008  . STRABISMUS SURGERY Bilateral age 57  . TONSILLECTOMY  age 74  . TRANSTHORACIC ECHOCARDIOGRAM  03-23-2015   dr berry   grade 1 diastolic dysfunction, ef 08-65%/  mild AV thickened leaflets with no stenosis,  moderate AR/  mild ascending aorta ilatation3.9cm/  trivial MR and TR  . UPPER GASTROINTESTINAL ENDOSCOPY      There were no vitals filed for this visit.    Subjective Assessment - 05/06/20 1100    Subjective Pt referred to PT for chronic constipation since childhood when diagnosed with spastic colon. Ongoing urine leakage since prostate surgery in 2010.    Pertinent History prostate surgery 2010, hemorrhoids, DVT/PE on  anti-coagulatant, diverticulosis, gall bladder and bladder surgery (cyst - cancer)    Diagnostic tests procedures: colonoscopy Dec 2020, endoscopy 2021, cystoscopy    Patient Stated Goals ease with BMs and minimize dripping of urine    Currently in Pain? Yes    Pain Score 2     Pain Location Abdomen    Pain Orientation Lower    Pain Descriptors / Indicators Aching    Pain Type Chronic pain    Pain Onset More than a month ago    Pain Frequency Constant    Aggravating Factors  urine leakage with position transitions              [facility] PT Assessment - 05/06/20 0001      Assessment   Medical Diagnosis R19.8 (ICD-10-CM) - Abnormal defecation K59.02 (ICD-10-CM) - Constipation due to outlet dysfunction    Referring Provider (PT) Gatha Mayer, MD    Onset Date/Surgical Date --   years   Next MD Visit as needed    Prior Therapy no      Precautions   Precautions None      Restrictions   Weight Bearing Restrictions No      Balance Screen   Has the patient fallen in the past 6 months No      [place] residence    Living Arrangements Spouse/significant other    Type of [place]      Prior Function   Level of [place] Retired;Part time employment    Vocation Requirements stereo equipment work    Leisure Chiropodist, stationary bike      Cognition   Overall Cognitive Status Within Functional Limits for tasks assessed      Functional Tests   Functional tests Single leg stance      Single Leg Stance   Comments unable to balance < 3 sec Rt/Lt      Posture/Postural Control   Posture/Postural Control Postural limitations    Postural Limitations Increased thoracic kyphosis;Decreased lumbar lordosis      ROM / Strength   AROM / PROM / Strength AROM;Strength;PROM      AROM   Overall AROM Comments poor lumbar fascial mobility noted    AROM Assessment Site Lumbar    Lumbar Flexion 30    Lumbar Extension 8     Lumbar - Right Side Bend 5    Lumbar - Left Side Bend 5    Lumbar - Right Rotation limited 75%    Lumbar - Left Rotation limited 75%  PROM   Overall PROM Comments bil hip ER and flexion limited 30% with pain in hips      Strength   Overall Strength Comments WFL bil LEs      Flexibility   Soft Tissue Assessment /Muscle Length yes    Hamstrings limited 30%    Piriformis limited 30%      Palpation   Spinal mobility limited by fascial restrictions as R1                      Objective measurements completed on examination: See above findings.     Pelvic Floor Special Questions - 05/06/20 0001    Prior Pelvic/Prostate Exam --   removed 2010   Prior Urinalysis Yes    Result of last Urinalysis normal    Diagnostic Test/Procedure Performed Yes    Type Diagnostic/Procedure cystoscopy    Date Diagnostic/Procedure 2021    Currently Sexually Active No    Urinary Leakage Yes    How often every other day    Activities that cause leaking Exercising;Other   position transitions with bend/stand   Urinary frequency 5-7   BM with Miralax daily, thin "ribbons"   Fluid intake not enough    Caffeine beverages 2 cups of coffee in AM    External Perineal Exam PT explained PF assessment and Pt gave verbal consent    Pelvic Floor Internal Exam PT explained PF assessment and Pt gave verbal consent    Exam Type Rectal    Sensation intact    Palpation slight tenderness puborectalis Lt>Rt    Strength fair squeeze, definite lift   contracts vs bulges when cued to bulge                     PT Short Term Goals - 05/06/20 1325      PT SHORT TERM GOAL #1   Title Pt will be ind with initial HEP for improved global mobility, flexibilty and coordination of PF.    Time 4    Period Weeks    Status New    Target Date 06/03/20      PT SHORT TERM GOAL #2   Title Pt will be able to demo proper toileting posture and breathing to help bulge pelvic floor.    Time 4    Period  Weeks    Status New    Target Date 06/03/20      PT SHORT TERM GOAL #3   Title Pt will be able to verbalize at least 3 consistent strategies he is using to implement the beginnings of a healthy bowel routine.    Time 4    Period Weeks    Status New    Target Date 06/03/20             PT Long Term Goals - 05/06/20 1328      PT LONG TERM GOAL #1   Title Pt will be ind with advanced HEP and self-care strategies for PF function for toileting.    Time 12    Period Weeks    Status New    Target Date 07/29/20      PT LONG TERM GOAL #2   Title Pt will be able to bulge the PF for improved defecation success.    Time 12    Period Weeks    Status New    Target Date 07/29/20      PT LONG TERM GOAL #3   Title  Pt will report reduced need for OTC aids for BM regularity by at least 50%    Time 12    Period Weeks    Status New    Target Date 07/29/20      PT LONG TERM GOAL #4   Title Pt will report Type 3 or Type 4 BM at least 2x/week without signif need to strain to defecate.    Time 12    Period Weeks    Status New    Target Date 07/29/20      PT LONG TERM GOAL #5   Title Pt will demo improved trunk and hip mobility to [facility] so he can achieve good toileting posture and optimize gut motility through movement.    Time 12    Period Weeks    Status New    Target Date 07/29/20                  Plan - 05/06/20 1315    Clinical Impression Statement Pt is a pleasant 74yo male with history of chronic constipation.  He presents with global stiffness and limited A/ROM and P/ROM of trunk, hips and LEs with signif restriction in fascial mobility of lumbar region.  He has signif flexibility restrictions in bil hips.  PSH includes prostatectomy, repair of perirectal fistula.  PMH includes hemorrhoids, DVT/PE, and diverticulosis.  Pt has signif elevated tone of PF muscles with reduced anorectal angle secondary to tight puborectalis bil Lt>Rt.  He is able to perform diaphragmatic breath  and was able to reduce some PF tension with imagery at eval.  He contracts vs bulges with efforts to push/bulge PF.  Pt will benefit from skilled PT to address mobility, flexibility, toileting strategies and pelvic floor tension to improve bowel routine and abdominal pain.    Personal Factors and Comorbidities Time since onset of injury/illness/exacerbation;Comorbidity 1;Comorbidity 2;Comorbidity 3+;Age    Comorbidities PMH: hemorrhoids, DVT/PE on anti-coagulatant, diverticulosis PSH: prostatectomy, repair of perirectal fistual    Examination-Activity Limitations Continence;Toileting;Bend;Squat    Examination-Participation Restrictions Interpersonal Relationship    Stability/Clinical Decision Making Stable/Uncomplicated    Clinical Decision Making Low    Rehab Potential Excellent    PT Frequency 1x / week    PT Duration 12 weeks    PT Treatment/Interventions ADLs/Self Care Home Management;Biofeedback;Electrical Stimulation;Moist Heat;Neuromuscular re-education;Balance training;Functional mobility training;Therapeutic activities;Therapeutic exercise;Manual techniques;Patient/family education;Dry needling;Passive range of motion;Joint Manipulations;Spinal Manipulations    PT Next Visit Plan abd massage, lumbar fascial release, hip and lumbar stretching/mobility including rotation, intro toileting posture, practice PF bulge with breathing    PT Home Exercise Plan abd massage, visualization for PF drop/release, 5 in/5 out breathing into pelvic bowl, palpate anus on toilet to see if bulging    Consulted and Agree with Plan of Care Patient           Patient will benefit from skilled therapeutic intervention in order to improve the following deficits and impairments:  Decreased coordination, Decreased range of motion, Postural dysfunction, Decreased mobility, Impaired flexibility, Hypomobility, Increased muscle spasms  Visit Diagnosis: Other lack of coordination - Plan: PT plan of care  cert/re-cert  Cramp and spasm - Plan: PT plan of care cert/re-cert     Problem List Patient Active Problem List   Diagnosis Date Noted  . Thoracic aortic aneurysm ([place]) 04/05/2016  . Aortic insufficiency 04/05/2016  . Prolapsed internal hemorrhoids, grade 2 03/31/2014  . Upper abdominal pain - chronic 03/31/2014  . Dysphagia 03/31/2014  . Antiphospholipid antibody syndrome ([place]) 05/22/2012  .  Pulmonary embolus ([place]) 11/11/2011  . DVT, lower extremity, distal 11/11/2011  . Family history of malignant neoplasm of gastrointestinal tract 10/18/2011  . GERD (gastroesophageal reflux disease) 06/23/2011  . IBS (irritable bowel syndrome) 06/23/2011  . Premature ventricular contractions 06/22/2011  . Malignant neoplasm of bladder ([place]) 12/12/2008  . PROSTATE CANCER, HX OF 12/12/2008  . PULMONARY EMBOLISM, HX OF 12/12/2008  . HYPERLIPIDEMIA 01/05/2008  . ANXIETY DEPRESSION 01/05/2008  . Essential hypertension 01/05/2008  . Hypertensive heart disease without heart failure 01/05/2008  . CHOLELITHIASIS, ASYMPTOMATIC 01/05/2008  . SLEEP APNEA 01/05/2008  . SYSTOLIC MURMUR 88/50/2774  . Hx of adenomatous colonic polyps 10/14/2003    Baruch Merl, PT 05/06/20 1:34 PM   [facility] [address], [place], [place], [address] Phone: [phone]   Fax:  [phone]  Name: SUBHAN HOOPES MRN: [id number] Date of Birth: 16-Sep-1945

## 2020-05-08 ENCOUNTER — Ambulatory Visit: Payer: Medicare Other | Admitting: Cardiovascular Disease

## 2020-05-08 ENCOUNTER — Other Ambulatory Visit: Payer: Self-pay

## 2020-05-08 ENCOUNTER — Encounter: Payer: Self-pay | Admitting: Cardiovascular Disease

## 2020-05-08 VITALS — BP 124/71 | HR 62 | Ht 71.0 in | Wt 220.2 lb

## 2020-05-08 DIAGNOSIS — I712 Thoracic aortic aneurysm, without rupture, unspecified: Secondary | ICD-10-CM

## 2020-05-08 DIAGNOSIS — I10 Essential (primary) hypertension: Secondary | ICD-10-CM | POA: Diagnosis not present

## 2020-05-08 DIAGNOSIS — Z86718 Personal history of other venous thrombosis and embolism: Secondary | ICD-10-CM | POA: Diagnosis not present

## 2020-05-08 DIAGNOSIS — I351 Nonrheumatic aortic (valve) insufficiency: Secondary | ICD-10-CM | POA: Diagnosis not present

## 2020-05-08 NOTE — Assessment & Plan Note (Signed)
History of thoracic aortic aneurysm measuring 4.5 cm by CTA performed 06/04/2021 this is remained stable.  He is followed by Dr. Cyndia Bent.

## 2020-05-08 NOTE — Patient Instructions (Addendum)
Medication Instructions:  No Changes In Medications at this time.  *If you need a refill on your cardiac medications before your next appointment, please call your pharmacy*  Lab Work: None Ordered At This Time.  If you have labs (blood work) drawn today and your tests are completely normal, you will receive your results only by: Marland Kitchen MyChart Message (if you have MyChart) OR . A paper copy in the mail If you have any lab test that is abnormal or we need to change your treatment, we will call you to review the results.  Testing/Procedures: Your physician has requested that you have an echocardiogram in 1 year. Echocardiography is a painless test that uses sound waves to create images of your heart. It provides your doctor with information about the size and shape of your heart and how well your heart's chambers and valves are working. You may receive an ultrasound enhancing agent through an IV if needed to better visualize your heart during the echo.This procedure takes approximately one hour. There are no restrictions for this procedure. This will take place at the [address].     Follow-Up: At [facility], you and your health needs are our priority.  As part of our continuing mission to provide you with exceptional heart care, we have created designated Provider Care Teams.  These Care Teams include your primary Cardiologist (physician) and Advanced Practice Providers (APPs -  Physician Assistants and Nurse Practitioners) who all work together to provide you with the care you need, when you need it.  Your next appointment:   1 year(s)  The format for your next appointment:   In Person  Provider:   Dr. Gwenlyn Found

## 2020-05-08 NOTE — Assessment & Plan Note (Signed)
History of hyperlipidemia on Crestor 5 mg a day with lipid profile performed 10/21/2019 revealing total cholesterol 143, LDL of 82 and HDL of 43.

## 2020-05-08 NOTE — Assessment & Plan Note (Signed)
History of pulmonary embolism in the past with a positive work-up for a hypercoagulable state (lupus anticoagulant) on rivaroxaban.

## 2020-05-08 NOTE — Assessment & Plan Note (Signed)
History of mild to moderate aortic insufficiency on echo performed 05/01/2020 with otherwise normal LV size and function with grade 1 diastolic dysfunction.  He is totally asymptomatic.

## 2020-05-08 NOTE — Assessment & Plan Note (Signed)
History of essential hypertension blood pressure measured today 124/71.  He is on hydrochlorothiazide metoprolol and ramipril.

## 2020-05-08 NOTE — Progress Notes (Signed)
05/08/2020 TREVAN MESSMAN   1946/08/09  [id number]  Primary Physician [name], MD Primary Cardiologist: [name] MD [name], [place]  HPI:  [name] is a 74 y.o.   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 I last saw him 05/01/2019.Marland Kitchen He has a history of remote tobacco abuse, having smoked cigars in the past, treated hypertension, dyslipidemia. He has never had a heart attack or stroke. He has had bladder and prostate cancer, and he has had prostate surgery. He is followed by Dr. Algis Greenhouse and has been evaluated by Dr. Liam Rogers in the past with a Myoview stress test that was normal and an event monitor that showed PVCs for palpitations. He does drink 2-4 cups of coffee a day and has had palpitations since he was a teenager. He saw [name] complaining of right-sided chest pain and shortness of breath. He had a CT scan done April 19, 2012 that showed 2 small subsegmental right-sided pulmonary emboli of unclear origin. There is no evidence of DVT. He was placed on Xarelto. Dr. Beryle Beams did a hypercoagulable workup which apparently, according to the patient, was positive for lupus anticoagulant. Since I saw him hehas remained asymptomatic. We have been following his small thoracic aortic aneurysm which a year ago measured 4.7 cm   Since I saw him a year ago he is remained stable.    A chest CTA performed 06/05/2019 revealed his thoracic aortic aneurysm measured 4.5 cm which has been stable.  Dr. Cyndia Bent follows this.  He additionally had a 2D echo performed 05/01/2020 that showed mild to moderate aortic insufficiency, normal LV function with grade 1 diastolic dysfunction.  He is very active.  He denies chest pain or shortness of breath.   Current Meds  Medication Sig  . ALPRAZolam (XANAX) 1 MG tablet Take 1 mg by mouth at bedtime  as needed for anxiety.  . Cholecalciferol (VITAMIN D3) 50 MCG (2000 UT) capsule   . co-enzyme Q-10 30 MG capsule Take 30 mg by mouth every evening.   Marland Kitchen CRESTOR 5 MG tablet TAKE 1 TABLET BY MOUTH EVERY DAY (Patient taking differently: TAKE 1 TABLET BY MOUTH EVERY DAY-- takes in am)  . fish oil-omega-3 fatty acids 1000 MG capsule Take 1 g by mouth daily.   . fluticasone (FLONASE) 50 MCG/ACT nasal spray 2 sprays at bedtime.  . hydrochlorothiazide (HYDRODIURIL) 25 MG tablet Take 25 mg by mouth daily.  . hydrocortisone (ANUSOL-HC) 2.5 % rectal cream PLACE RECTALLY AT BEDTIME AS NEEDED FOR HEMORRHOIDS.  Marland Kitchen Hypertonic Nasal Wash (SINUS RINSE BOTTLE KIT) PACK Place into the nose at bedtime.    . methylcellulose oral powder Take 1 packet by mouth 2 (two) times daily.  . metoprolol tartrate (LOPRESSOR) 100 MG tablet TAKE 1 TABLET BY MOUTH TWICE A DAY  . Multiple Vitamin (MULTIVITAMIN) tablet Take 1 tablet by mouth daily.    Marland Kitchen nystatin (MYCOSTATIN) 100000 UNIT/ML suspension Take by mouth.  Marland Kitchen oxymetazoline (AFRIN) 0.05 % nasal spray Place 2 sprays into the nose 2 (two) times daily.    . pantoprazole (PROTONIX) 40 MG tablet Take 1 tablet (40 mg total) by mouth daily.  . ramipril (ALTACE) 10 MG capsule TAKE 1 CAPSULE (10 MG TOTAL) BY MOUTH 2 (TWO) TIMES DAILY  . Rivaroxaban (XARELTO) 20 MG TABS Take 20 mg by mouth daily.  . sertraline (ZOLOFT) 100  MG tablet Take 100 mg by mouth daily.     Allergies  Allergen Reactions  . Aspirin Other (See Comments)    REACTION: gi upset with higher dose  . Penicillins Itching    Social History   Socioeconomic History  . Marital status: Married    Spouse name: Not on file  . Number of children: 2  . Years of education: Not on file  . Highest education level: Not on file  Occupational History  . Occupation: Art gallery manager  . Occupation: FIELD APPLICATIONS    Employer: [employer]  Tobacco Use  . Smoking status: Current Some Day Smoker    Years: 51.00     Types: Cigars  . Smokeless tobacco: Never Used  . Tobacco comment: 5 cigars /week--/  smoked cigarettes for 10 years quit 1990's  Vaping Use  . Vaping Use: Never used  Substance and Sexual Activity  . Alcohol use: Yes    Alcohol/week: 28.0 standard drinks    Types: 14 Glasses of wine, 14 Cans of beer per week    Comment: 2 beers a day  . Drug use: No  . Sexual activity: Not on file  Other Topics Concern  . Not on file  Social History Narrative   Married to [place] Charlann Lange)   Gets regular exercise.   Daily caffeine- 2 cups daily.   Education: MS Estate manager/land agent   2 kids + grandchildren   Enjoys on track car driving (e.g, [employer])      Social Determinants of Health   Financial Resource Strain:   . Difficulty of Paying Living Expenses: Not on file  Food Insecurity:   . Worried About Charity fundraiser in the Last Year: Not on file  . Ran Out of Food in the Last Year: Not on file  Transportation Needs:   . Lack of Transportation (Medical): Not on file  . Lack of Transportation (Non-Medical): Not on file  Physical Activity:   . Days of Exercise per Week: Not on file  . Minutes of Exercise per Session: Not on file  Stress:   . Feeling of Stress : Not on file  Social Connections:   . Frequency of Communication with Friends and Family: Not on file  . Frequency of Social Gatherings with Friends and Family: Not on file  . Attends Religious Services: Not on file  . Active Member of Clubs or Organizations: Not on file  . Attends Archivist Meetings: Not on file  . Marital Status: Not on file  Intimate Partner Violence:   . Fear of Current or Ex-Partner: Not on file  . Emotionally Abused: Not on file  . Physically Abused: Not on file  . Sexually Abused: Not on file     Review of Systems: General: negative for chills, fever, night sweats or weight changes.  Cardiovascular: negative for chest pain, dyspnea on exertion, edema, orthopnea, palpitations, paroxysmal  nocturnal dyspnea or shortness of breath Dermatological: negative for rash Respiratory: negative for cough or wheezing Urologic: negative for hematuria Abdominal: negative for nausea, vomiting, diarrhea, bright red blood per rectum, melena, or hematemesis Neurologic: negative for visual changes, syncope, or dizziness All other systems reviewed and are otherwise negative except as noted above.    Blood pressure 124/71, pulse 62, height 5' 11"  (1.803 m), weight 220 lb 3.2 oz (99.9 kg), SpO2 96 %.  General appearance: alert and no distress Neck: no adenopathy, no carotid bruit, no JVD, supple, symmetrical, trachea midline and thyroid not enlarged, symmetric,  no tenderness/mass/nodules Lungs: clear to auscultation bilaterally Heart: regular rate and rhythm, S1, S2 normal, no murmur, click, rub or gallop Extremities: extremities normal, atraumatic, no cyanosis or edema Pulses: 2+ and symmetric Skin: Skin color, texture, turgor normal. No rashes or lesions Neurologic: Alert and oriented X 3, normal strength and tone. Normal symmetric reflexes. Normal coordination and gait  EKG sinus rhythm at 62 without ST or T wave changes.  I personally reviewed this EKG.  ASSESSMENT AND PLAN:   HYPERLIPIDEMIA History of hyperlipidemia on Crestor 5 mg a day with lipid profile performed 10/21/2019 revealing total cholesterol 143, LDL of 82 and HDL of 43.  Essential hypertension History of essential hypertension blood pressure measured today 124/71.  He is on hydrochlorothiazide metoprolol and ramipril.  PULMONARY EMBOLISM, HX OF History of pulmonary embolism in the past with a positive work-up for a hypercoagulable state (lupus anticoagulant) on rivaroxaban.  Thoracic aortic aneurysm [facility]) History of thoracic aortic aneurysm measuring 4.5 cm by CTA performed 06/04/2021 this is remained stable.  He is followed by Dr. Cyndia Bent.  Aortic insufficiency History of mild to moderate aortic insufficiency on echo  performed 05/01/2020 with otherwise normal LV size and function with grade 1 diastolic dysfunction.  He is totally asymptomatic.      [name] MD FACP,FACC,FAHA, [facility] 05/08/2020 4:43 PM

## 2020-05-09 ENCOUNTER — Other Ambulatory Visit: Payer: Self-pay | Admitting: Cardiovascular Disease

## 2020-05-09 ENCOUNTER — Other Ambulatory Visit: Payer: Self-pay | Admitting: Internal Medicine

## 2020-05-13 ENCOUNTER — Other Ambulatory Visit: Payer: Self-pay

## 2020-05-13 ENCOUNTER — Encounter: Payer: Self-pay | Admitting: Physical Therapy

## 2020-05-13 ENCOUNTER — Ambulatory Visit: Payer: Medicare Other | Admitting: Physical Therapy

## 2020-05-13 DIAGNOSIS — R278 Other lack of coordination: Secondary | ICD-10-CM

## 2020-05-13 DIAGNOSIS — R252 Cramp and spasm: Secondary | ICD-10-CM | POA: Diagnosis not present

## 2020-05-13 NOTE — Therapy (Signed)
[facility] [address], [place], [place], [address] Phone: [phone]   Fax:  [phone]  Physical Therapy Treatment  Patient Details  Name: [name] MRN: [id number] Date of Birth: 1945/09/02 Referring Provider (PT): Gatha Mayer, MD   Encounter Date: 05/13/2020   PT End of Session - 05/13/20 1103    Visit Number 2    Date for PT Re-Evaluation 07/29/20    Authorization Type UHC Medicare    Progress Note Due on Visit 10    PT Start Time 1100    PT Stop Time 1145    PT Time Calculation (min) 45 min    Activity Tolerance Patient tolerated treatment well    Behavior During Therapy [facility] for tasks assessed/performed           Past Medical History:  Diagnosis Date  . Antiphospholipid antibody syndrome ([place]) 05/22/2012   Found subsequent to recurrent PE 9/13  . Arthritis   . Bladder cancer ([place]) dx'd 2009   surg only  . Chronic anal fissure    w/ recurrence's  . Chronic anxiety   . Depression   . ED (erectile dysfunction)   . Esophageal candidiasis ([place]) 03/14/2017  . Fatty liver   . GERD (gastroesophageal reflux disease)   . Heart murmur   . Hiatal hernia   . History of adenomatous polyp of colon    2005  . History of bladder cancer urologist-  dr grapey   papillary TCC  s/p  turbt  . History of DVT (deep vein thrombosis)    post prostate surgery 2010  right LLE  . History of esophageal stricture    w/ dilation  . History of gastric polyp    benign 2010  . History of prostate cancer urologist- dr grapey/  oncology-  dr Alen Blew   Stage  T1c , Gleason 3+3;   s/p  prostatectomy 03-08-210//  per pt currently PSA nondetectable   . History of pulmonary embolus (PE)    post prostate surgery 2010  and 2013 x2 right side per CT scan (found to have lupus anticoaglated )  . History of TIA (transient ischemic attack)    07/ 2012 noted on scans remote probable tia's in  age 84's  . History of TMJ  syndrome   . HTN (hypertension)   . Hyperlipidemia   . Lupus anticoagulant positive    found 2013 when pt had PE  . Moderate aortic regurgitation    with no  stenosis   . OSA on CPAP    per study severe osa 12-14-2010  . Prolapsed internal hemorrhoids, grade 2 03/31/2014  . prostate ca dx'd 2010   surg only  . RLS (restless legs syndrome)   . RLS (restless legs syndrome)   . Sleep apnea   . Symptomatic PVCs cardiologist-  dr berry   intermittant  . Wears glasses     Past Surgical History:  Procedure Laterality Date  . APPENDECTOMY  age 11   and Left Inguinal Hernia Repair  . CARDIOVASCULAR STRESS TEST  06-30-2011   dr berry   normal nuclear study/  normal LV function and wall motion, ef 56%  . COLONOSCOPY  last one 10-19-2011  . CYSTO/ BLADDER BX/  RETROGRADE PYELOGRAM/  TRANSRECTAL PROSTATE BX'S  07-30-2008  . ESOPHAGOGASTRODUODENOSCOPY  last one 06-27-2011  . EVALUATION UNDER ANESTHESIA WITH FISTULECTOMY N/A 01/14/2016   Procedure: EXAM UNDER ANESTHESIA WITH REPAIR OF PERIRECTAL FISTULA  (LIFT);  Surgeon: Remo Lipps  Gross, MD;  Location: [facility];  Service: General;  Laterality: N/A;  . LAPAROSCOPIC CHOLECYSTECTOMY  05-25-2007   and Excision cyst back of neck  . ROBOT ASSISTED LAPAROSCOPIC RADICAL PROSTATECTOMY  10-20-2008  . STRABISMUS SURGERY Bilateral age 19  . TONSILLECTOMY  age 89  . TRANSTHORACIC ECHOCARDIOGRAM  03-23-2015   dr berry   grade 1 diastolic dysfunction, ef 79-02%/  mild AV thickened leaflets with no stenosis,  moderate AR/  mild ascending aorta ilatation3.9cm/  trivial MR and TR  . UPPER GASTROINTESTINAL ENDOSCOPY      There were no vitals filed for this visit.   Subjective Assessment - 05/13/20 1114    Subjective Stool was more soft and formed and thicker x 4 days vs ribbons after eval.    Pertinent History prostate surgery 2010, hemorrhoids, DVT/PE on anti-coagulatant, diverticulosis, gall bladder and bladder surgery (cyst - cancer)                              [facility] Adult PT Treatment/Exercise - 05/13/20 0001      Neuro Re-ed    Neuro Re-ed Details  toileting techniques for breathing and bulging PF, seated on yoga blocks and feet on squatty potty, PT gave TCs at abdomen for big belly hard belly      Exercises   Exercises Lumbar;Knee/Hip      Lumbar Exercises: Stretches   Active Hamstring Stretch Left;Right;2 reps;20 seconds    Active Hamstring Stretch Limitations seated on black pad    Figure 4 Stretch 1 rep;30 seconds;With overpressure    Figure 4 Stretch Limitations seated chair + black pad    Other Lumbar Stretch Exercise seated ball rollouts for lumbar flexion and flex/SB combo 2x5 sec each      Lumbar Exercises: Quadruped   Madcat/Old Horse 10 reps    Other Quadruped Lumbar Exercises child's pose and child's pose with SB bil 2x5 sec each    Other Quadruped Lumbar Exercises quadruped rocking with SITS bones wide and lift tailbone      Manual Therapy   Manual Therapy Myofascial release;Joint mobilization    Joint Mobilization rib springing and thoracic PAs Gr II/III bil    Myofascial Release skin rolling and thoracodorsal fascial release bil, posterior to anterior release bil                    PT Short Term Goals - 05/13/20 1312      PT SHORT TERM GOAL #1   Title Pt will be ind with initial HEP for improved global mobility, flexibilty and coordination of PF.    Status On-going      PT SHORT TERM GOAL #2   Title Pt will be able to demo proper toileting posture and breathing to help bulge pelvic floor.    Status On-going             PT Long Term Goals - 05/06/20 1328      PT LONG TERM GOAL #1   Title Pt will be ind with advanced HEP and self-care strategies for PF function for toileting.    Time 12    Period Weeks    Status New    Target Date 07/29/20      PT LONG TERM GOAL #2   Title Pt will be able to bulge the PF for improved defecation success.    Time 12     Period Weeks  Status New    Target Date 07/29/20      PT LONG TERM GOAL #3   Title Pt will report reduced need for OTC aids for BM regularity by at least 50%    Time 12    Period Weeks    Status New    Target Date 07/29/20      PT LONG TERM GOAL #4   Title Pt will report Type 3 or Type 4 BM at least 2x/week without signif need to strain to defecate.    Time 12    Period Weeks    Status New    Target Date 07/29/20      PT LONG TERM GOAL #5   Title Pt will demo improved trunk and hip mobility to [facility] so he can achieve good toileting posture and optimize gut motility through movement.    Time 12    Period Weeks    Status New    Target Date 07/29/20                 Plan - 05/13/20 1307    Clinical Impression Statement Pt reported thicker, softer stools vs ribbons for approx 4 days after evaluation.  PT focused on increasing mobility and flexibility of trunk and hips to promote ability to achieve ideal toileting postures and reduce pelvic muscle tone for improved defecation.  Pt was able to demo improved ability to properly breathe and bulge for toileting with review and VC/TC today.  PT issued HEP for LE, pelvic floor and trunk stretches today along with toileting techniques info.  He will continue to benefit from skilled PT along POC.    Comorbidities PMH: hemorrhoids, DVT/PE on anti-coagulatant, diverticulosis PSH: prostatectomy, repair of perirectal fistual    Rehab Potential Excellent    PT Frequency 1x / week    PT Duration 12 weeks    PT Treatment/Interventions ADLs/Self Care Home Management;Biofeedback;Electrical Stimulation;Moist Heat;Neuromuscular re-education;Balance training;Functional mobility training;Therapeutic activities;Therapeutic exercise;Manual techniques;Patient/family education;Dry needling;Passive range of motion;Joint Manipulations;Spinal Manipulations    PT Next Visit Plan review toileting on yoga blocks with squatty potty, f/u on HEP, puborectalis  release techniques, discuss bowel routine and give handout    PT Home Exercise Plan Access Code: [email or web address], abd massage, visualization for PF drop/release, 5 in/5 out breathing into pelvic bowl, palpate anus on toilet to see if bulging    Recommended Other Services cert is signed    Consulted and Agree with Plan of Care Patient           Patient will benefit from skilled therapeutic intervention in order to improve the following deficits and impairments:     Visit Diagnosis: Other lack of coordination  Cramp and spasm     Problem List Patient Active Problem List   Diagnosis Date Noted  . Thoracic aortic aneurysm ([place]) 04/05/2016  . Aortic insufficiency 04/05/2016  . Prolapsed internal hemorrhoids, grade 2 03/31/2014  . Upper abdominal pain - chronic 03/31/2014  . Dysphagia 03/31/2014  . Antiphospholipid antibody syndrome ([place]) 05/22/2012  . Pulmonary embolus ([place]) 11/11/2011  . DVT, lower extremity, distal 11/11/2011  . Family history of malignant neoplasm of gastrointestinal tract 10/18/2011  . GERD (gastroesophageal reflux disease) 06/23/2011  . IBS (irritable bowel syndrome) 06/23/2011  . Premature ventricular contractions 06/22/2011  . Malignant neoplasm of bladder ([place]) 12/12/2008  . PROSTATE CANCER, HX OF 12/12/2008  . PULMONARY EMBOLISM, HX OF 12/12/2008  . HYPERLIPIDEMIA 01/05/2008  . ANXIETY DEPRESSION 01/05/2008  . Essential hypertension 01/05/2008  . Hypertensive  heart disease without heart failure 01/05/2008  . CHOLELITHIASIS, ASYMPTOMATIC 01/05/2008  . SLEEP APNEA 01/05/2008  . SYSTOLIC MURMUR 97/67/3419  . Hx of adenomatous colonic polyps 10/14/2003    Baruch Merl, PT 05/13/20 1:13 PM   [facility] [address], [place], [place], [address] Phone: [phone]   Fax:  [phone]  Name: [name] MRN: [id number] Date of Birth: 1945-12-17

## 2020-05-13 NOTE — Patient Instructions (Signed)
Access Code: [email or web address] URL: [email or web address] Date: 05/13/2020 Prepared by: Venetia Night Teva Bronkema  Exercises Child's Pose Stretch - 1 x daily - 7 x weekly - 1 sets - 10 reps - 5 hold Cat-Camel - 1 x daily - 7 x weekly - 1 sets - 10 reps - 5 hold Quadruped Rocking Slow - 1 x daily - 7 x weekly - 1 sets - 10 reps Seated Hamstring Stretch - 1 x daily - 7 x weekly - 1 sets - 2 reps - 20 hold Seated Figure 4 Piriformis Stretch - 1 x daily - 7 x weekly - 1 sets - 2 reps - 20 hold   Toileting Techniques for Bowel Movements (Defecation) Using your belly (abdomen) and pelvic floor muscles to have a bowel movement is usually instinctive.  Sometimes people can have problems with these muscles and have to relearn proper defecation (emptying) techniques.  If you have weakness in your muscles, organs that are falling out, decreased sensation in your pelvis, or ignore your urge to go, you may find yourself straining to have a bowel movement.  You are straining if you are: . holding your breath or taking in a huge gulp of air and holding it  . keeping your lips and jaw tensed and closed tightly . turning red in the face because of excessive pushing or forcing . developing or worsening your  hemorrhoids . getting faint while pushing . not emptying completely and have to defecate many times a day  If you are straining, you are actually making it harder for yourself to have a bowel movement.  Many people find they are pulling up with the pelvic floor muscles and closing off instead of opening the anus. Due to lack pelvic floor relaxation and coordination the abdominal muscles, one has to work harder to push the feces out.  Many people have never been taught how to defecate efficiently and effectively.  Notice what happens to your body when you are having a bowel movement.  While you are sitting on the toilet pay attention to the following areas: . Jaw and mouth position . Angle of your hips    . Whether your feet touch the ground or not . Arm placement  . Spine position . Waist . Belly tension . Anus (opening of the anal canal)  An Evacuation/Defecation Plan   Here are the 4 basic points:  1. Lean forward enough for your elbows to rest on your knees 2. Support your feet on the floor or use a low stool if your feet don't touch the floor  3. Push out your belly as if you have swallowed a beach ball--you should feel a widening of your waist 4. Open and relax your pelvic floor muscles, rather than tightening around the anus       The following conditions my require modifications to your toileting posture:  . If you have had surgery in the past that limits your back, hip, pelvic, knee or ankle flexibility . Constipation   Your healthcare practitioner may make the following additional suggestions and adjustments:  1) Sit on the toilet  a) Make sure your feet are supported. b) Notice your hip angle and spine position--most people find it effective to lean forward or raise their knees, which can help the muscles around the anus to relax  c) When you lean forward, place your forearms on your thighs for support  2) Relax suggestions a) Breath deeply in through your nose and out slowly through your mouth  as if you are smelling the flowers and blowing out the candles. b) To become aware of how to relax your muscles, contracting and releasing muscles can be helpful.  Pull your pelvic floor muscles in tightly by using the image of holding back gas, or closing around the anus (visualize making a circle smaller) and lifting the anus up and in.  Then release the muscles and your anus should drop down and feel open. Repeat 5 times ending with the feeling of relaxation. c) Keep your pelvic floor muscles relaxed; let your belly bulge out. d) The digestive tract starts at the mouth and ends at the anal opening, so be sure to relax both ends of the tube.  Place your tongue on the roof of  your mouth with your teeth separated.  This helps relax your mouth and will help to relax the anus at the same time.  3) Empty (defecation) a) Keep your pelvic floor and sphincter relaxed, then bulge your anal muscles.  Make the anal opening wide.  b) Stick your belly out as if you have swallowed a beach ball. c) Make your belly wall hard using your belly muscles while continuing to breathe. Doing this makes it easier to open your anus. d) Breath out and give a grunt (or try using other sounds such as ahhhh, shhhhh, ohhhh or grrrrrrr).  4) Finish a) As you finish your bowel movement, pull the pelvic floor muscles up and in.  This will leave your anus in the proper place rather than remaining pushed out and down. If you leave your anus pushed out and down, it will start to feel as though that is normal and give you incorrect signals about needing to have a bowel movement.

## 2020-05-15 ENCOUNTER — Other Ambulatory Visit: Payer: Self-pay

## 2020-05-15 DIAGNOSIS — I712 Thoracic aortic aneurysm, without rupture, unspecified: Secondary | ICD-10-CM

## 2020-05-15 DIAGNOSIS — I351 Nonrheumatic aortic (valve) insufficiency: Secondary | ICD-10-CM

## 2020-05-20 ENCOUNTER — Ambulatory Visit: Payer: Medicare Other | Attending: Internal Medicine | Admitting: Physical Therapy

## 2020-05-20 ENCOUNTER — Other Ambulatory Visit: Payer: Self-pay

## 2020-05-20 ENCOUNTER — Encounter: Payer: Self-pay | Admitting: Physical Therapy

## 2020-05-20 DIAGNOSIS — R278 Other lack of coordination: Secondary | ICD-10-CM | POA: Insufficient documentation

## 2020-05-20 DIAGNOSIS — R252 Cramp and spasm: Secondary | ICD-10-CM | POA: Diagnosis not present

## 2020-05-20 NOTE — Therapy (Signed)
[facility] [address], [place], [place], [address] Phone: [phone]   Fax:  [phone]  Physical Therapy Treatment  Patient Details  Name: [name] MRN: [id number] Date of Birth: [birth year] Referring Provider (PT): Gatha Mayer, MD   Encounter Date: 05/20/2020   PT End of Session - 05/20/20 1110    Visit Number 3    Date for PT Re-Evaluation 07/29/20    Authorization Type UHC Medicare    Progress Note Due on Visit 10    PT Start Time 1105    PT Stop Time 1143    PT Time Calculation (min) 38 min    Activity Tolerance Patient tolerated treatment well    Behavior During Therapy [facility] for tasks assessed/performed           Past Medical History:  Diagnosis Date   Antiphospholipid antibody syndrome ([place]) 05/22/2012   Found subsequent to recurrent PE 9/13   Arthritis    Bladder cancer ([place]) dx'd 2009   surg only   Chronic anal fissure    w/ recurrence's   Chronic anxiety    Depression    ED (erectile dysfunction)    Esophageal candidiasis ([place]) 03/14/2017   Fatty liver    GERD (gastroesophageal reflux disease)    Heart murmur    Hiatal hernia    History of adenomatous polyp of colon    2005   History of bladder cancer urologist-  dr Risa Grill   papillary TCC  s/p  turbt   History of DVT (deep vein thrombosis)    post prostate surgery 2010  right LLE   History of esophageal stricture    w/ dilation   History of gastric polyp    benign 2010   History of prostate cancer urologist- dr grapey/  oncology-  dr Alen Blew   Stage  T1c , Gleason 3+3;   s/p  prostatectomy 03-08-210//  per pt currently PSA nondetectable    History of pulmonary embolus (PE)    post prostate surgery 2010  and 2013 x2 right side per CT scan (found to have lupus anticoaglated )   History of TIA (transient ischemic attack)    07/ 2012 noted on scans remote probable tia's in  age 74's   History of TMJ  syndrome    HTN (hypertension)    Hyperlipidemia    Lupus anticoagulant positive    found 2013 when pt had PE   Moderate aortic regurgitation    with no  stenosis    OSA on CPAP    per study severe osa 12-14-2010   Prolapsed internal hemorrhoids, grade 2 03/31/2014   prostate ca dx'd 2010   surg only   RLS (restless legs syndrome)    RLS (restless legs syndrome)    Sleep apnea    Symptomatic PVCs cardiologist-  dr berry   intermittant   Wears glasses     Past Surgical History:  Procedure Laterality Date   APPENDECTOMY  age 88   and Left Inguinal Hernia Repair   CARDIOVASCULAR STRESS TEST  06-30-2011   dr berry   normal nuclear study/  normal LV function and wall motion, ef 56%   COLONOSCOPY  last one 10-19-2011   CYSTO/ BLADDER BX/  RETROGRADE PYELOGRAM/  TRANSRECTAL PROSTATE BX'S  07-30-2008   ESOPHAGOGASTRODUODENOSCOPY  last one 06-27-2011   EVALUATION UNDER ANESTHESIA WITH FISTULECTOMY N/A 01/14/2016   Procedure: EXAM UNDER ANESTHESIA WITH REPAIR OF PERIRECTAL FISTULA  (LIFT);  Surgeon: Remo Lipps  Gross, MD;  Location: [facility];  Service: General;  Laterality: N/A;   LAPAROSCOPIC CHOLECYSTECTOMY  05-25-2007   and Excision cyst back of neck   ROBOT ASSISTED LAPAROSCOPIC RADICAL PROSTATECTOMY  10-20-2008   STRABISMUS SURGERY Bilateral age 34   TONSILLECTOMY  age 36   TRANSTHORACIC ECHOCARDIOGRAM  03-23-2015   dr berry   grade 1 diastolic dysfunction, ef 81-01%/  mild AV thickened leaflets with no stenosis,  moderate AR/  mild ascending aorta ilatation3.9cm/  trivial MR and TR   UPPER GASTROINTESTINAL ENDOSCOPY      There were no vitals filed for this visit.   Subjective Assessment - 05/20/20 1108    Subjective I use a glycerin suppository and it is practically normal.    Pertinent History prostate surgery 2010, hemorrhoids, DVT/PE on anti-coagulatant, diverticulosis, gall bladder and bladder surgery (cyst - cancer)    Diagnostic tests  procedures: colonoscopy Dec 2020, endoscopy 2021, cystoscopy    Patient Stated Goals ease with BMs and minimize dripping of urine    Currently in Pain? No/denies                             [facility] Adult PT Treatment/Exercise - 05/20/20 0001      Exercises   Exercises Lumbar      Lumbar Exercises: Sidelying   Other Sidelying Lumbar Exercises open book bil 1x5 each (HEP)      Lumbar Exercises: Quadruped   Madcat/Old Horse 10 reps    Other Quadruped Lumbar Exercises child's pose and child's pose with SB bil 2x20 sec each      Manual Therapy   Manual Therapy Soft tissue mobilization    Joint Mobilization coccyx distraction and mobs for ext using internal/external finger placement    Soft tissue mobilization puborectalis ischemic pressure and massage for release and drop bil in SL, coccygeus bil                    PT Short Term Goals - 05/20/20 1245      PT SHORT TERM GOAL #1   Title Pt will be ind with initial HEP for improved global mobility, flexibilty and coordination of PF.    Status Achieved      PT SHORT TERM GOAL #2   Title Pt will be able to demo proper toileting posture and breathing to help bulge pelvic floor.    Status Achieved      PT SHORT TERM GOAL #3   Title Pt will be able to verbalize at least 3 consistent strategies he is using to implement the beginnings of a healthy bowel routine.    Status New             PT Long Term Goals - 05/20/20 1246      PT LONG TERM GOAL #1   Title Pt will be ind with advanced HEP and self-care strategies for PF function for toileting.    Status On-going      PT LONG TERM GOAL #2   Title Pt will be able to bulge the PF for improved defecation success.    Status On-going      PT LONG TERM GOAL #3   Title Pt will report reduced need for OTC aids for BM regularity by at least 50%    Status On-going      PT LONG TERM GOAL #4   Title Pt will report Type 3 or Type 4  BM at least 2x/week without  signif need to strain to defecate.    Status On-going      PT LONG TERM GOAL #5   Title Pt will demo improved trunk and hip mobility to [facility] so he can achieve good toileting posture and optimize gut motility through movement.    Status On-going                 Plan - 05/20/20 1142    Clinical Impression Statement Pt reports improved BMs using suppositories occassionally.  He is having daily BMs which are becoming thicker vs ribbons.  He notes urinary leakage and pelvic pain have also reduced since starting PT.  PT added open books and reviewed trunk stretches from HEP today.  Pt continues to have stiffness secondary to myofascial limitations throughout trunk.  PT performed manual release with consent to anorectal canal for puborectalis and coccygeus release, along with mobs to coccyx for distraction adn extension.  PT has some trouble maintaining bulge once initiated and clenches after initial bulge.  PT cued slower exhale and more gentle generation of bulge.  Pt will continue to benefit from skilled PT along POC to progress toward goals.    Comorbidities PMH: hemorrhoids, DVT/PE on anti-coagulatant, diverticulosis PSH: prostatectomy, repair of perirectal fistual    PT Next Visit Plan work on exhale/bulge consistency (tends to initially bulge but then clench), myofascial release ribcage and lumbar, puborectralis release    PT Home Exercise Plan Access Code: [email or web address], abd massage, visualization for PF drop/release, 5 in/5 out breathing into pelvic bowl, palpate anus on toilet to see if bulging    Consulted and Agree with Plan of Care Patient           Patient will benefit from skilled therapeutic intervention in order to improve the following deficits and impairments:     Visit Diagnosis: Other lack of coordination  Cramp and spasm     Problem List Patient Active Problem List   Diagnosis Date Noted   Thoracic aortic aneurysm ([place]) 04/05/2016   Aortic insufficiency 04/05/2016    Prolapsed internal hemorrhoids, grade 2 03/31/2014   Upper abdominal pain - chronic 03/31/2014   Dysphagia 03/31/2014   Antiphospholipid antibody syndrome ([place]) 05/22/2012   Pulmonary embolus ([place]) 11/11/2011   DVT, lower extremity, distal 11/11/2011   Family history of malignant neoplasm of gastrointestinal tract 10/18/2011   GERD (gastroesophageal reflux disease) 06/23/2011   IBS (irritable bowel syndrome) 06/23/2011   Premature ventricular contractions 06/22/2011   Malignant neoplasm of bladder ([place]) 12/12/2008   PROSTATE CANCER, HX OF 12/12/2008   PULMONARY EMBOLISM, HX OF 12/12/2008   HYPERLIPIDEMIA 01/05/2008   ANXIETY DEPRESSION 01/05/2008   Essential hypertension 01/05/2008   Hypertensive heart disease without heart failure 01/05/2008   CHOLELITHIASIS, ASYMPTOMATIC 01/05/2008   SLEEP APNEA 01/77/9390   SYSTOLIC MURMUR 30/04/2329   Hx of adenomatous colonic polyps 10/14/2003    Baruch Merl, PT 05/20/20 12:47 PM   [facility] [address] [place], [place], [address] Phone: [phone]   Fax:  [phone]  Name: [name] MRN: [id number] Date of Birth: Dec 05, 1945

## 2020-05-27 ENCOUNTER — Other Ambulatory Visit: Payer: Self-pay

## 2020-05-27 ENCOUNTER — Encounter: Payer: Self-pay | Admitting: Physical Therapy

## 2020-05-27 ENCOUNTER — Ambulatory Visit: Payer: Medicare Other | Admitting: Physical Therapy

## 2020-05-27 DIAGNOSIS — R252 Cramp and spasm: Secondary | ICD-10-CM | POA: Diagnosis not present

## 2020-05-27 DIAGNOSIS — R278 Other lack of coordination: Secondary | ICD-10-CM | POA: Diagnosis not present

## 2020-05-27 NOTE — Therapy (Signed)
[facility] [address] [place], [place], [address] Phone: [phone]   Fax:  [phone]  Physical Therapy Treatment  Patient Details  Name: [name] MRN: [id number] Date of Birth: 25-Jun-1946 Referring Provider (PT): Gatha Mayer, MD   Encounter Date: 05/27/2020    Past Medical History:  Diagnosis Date  . Antiphospholipid antibody syndrome ([place]) 05/22/2012   Found subsequent to recurrent PE 9/13  . Arthritis   . Bladder cancer ([place]) dx'd 2009   surg only  . Chronic anal fissure    w/ recurrence's  . Chronic anxiety   . Depression   . ED (erectile dysfunction)   . Esophageal candidiasis ([place]) 03/14/2017  . Fatty liver   . GERD (gastroesophageal reflux disease)   . Heart murmur   . Hiatal hernia   . History of adenomatous polyp of colon    2005  . History of bladder cancer urologist-  dr grapey   papillary TCC  s/p  turbt  . History of DVT (deep vein thrombosis)    post prostate surgery 2010  right LLE  . History of esophageal stricture    w/ dilation  . History of gastric polyp    benign 2010  . History of prostate cancer urologist- dr grapey/  oncology-  dr Alen Blew   Stage  T1c , Gleason 3+3;   s/p  prostatectomy 03-08-210//  per pt currently PSA nondetectable   . History of pulmonary embolus (PE)    post prostate surgery 2010  and 2013 x2 right side per CT scan (found to have lupus anticoaglated )  . History of TIA (transient ischemic attack)    07/ 2012 noted on scans remote probable tia's in  age 12's  . History of TMJ syndrome   . HTN (hypertension)   . Hyperlipidemia   . Lupus anticoagulant positive    found 2013 when pt had PE  . Moderate aortic regurgitation    with no  stenosis   . OSA on CPAP    per study severe osa 12-14-2010  . Prolapsed internal hemorrhoids, grade 2 03/31/2014  . prostate ca dx'd 2010   surg only  . RLS (restless legs syndrome)   . RLS (restless legs  syndrome)   . Sleep apnea   . Symptomatic PVCs cardiologist-  dr berry   intermittant  . Wears glasses     Past Surgical History:  Procedure Laterality Date  . APPENDECTOMY  age 56   and Left Inguinal Hernia Repair  . CARDIOVASCULAR STRESS TEST  06-30-2011   dr berry   normal nuclear study/  normal LV function and wall motion, ef 56%  . COLONOSCOPY  last one 10-19-2011  . CYSTO/ BLADDER BX/  RETROGRADE PYELOGRAM/  TRANSRECTAL PROSTATE BX'S  07-30-2008  . ESOPHAGOGASTRODUODENOSCOPY  last one 06-27-2011  . EVALUATION UNDER ANESTHESIA WITH FISTULECTOMY N/A 01/14/2016   Procedure: EXAM UNDER ANESTHESIA WITH REPAIR OF PERIRECTAL FISTULA  (LIFT);  Surgeon: Michael Boston, MD;  Location: [facility];  Service: General;  Laterality: N/A;  . LAPAROSCOPIC CHOLECYSTECTOMY  05-25-2007   and Excision cyst back of neck  . ROBOT ASSISTED LAPAROSCOPIC RADICAL PROSTATECTOMY  10-20-2008  . STRABISMUS SURGERY Bilateral age 33  . TONSILLECTOMY  age 78  . TRANSTHORACIC ECHOCARDIOGRAM  03-23-2015   dr berry   grade 1 diastolic dysfunction, ef 35-32%/  mild AV thickened leaflets with no stenosis,  moderate AR/  mild ascending aorta ilatation3.9cm/  trivial MR and  TR  . UPPER GASTROINTESTINAL ENDOSCOPY      There were no vitals filed for this visit.   Subjective Assessment - 05/27/20 1113    Subjective BMs with greater ease and thicker.  Only had to use suppository 1x.  Working on more consistent bulge with pushing for defecation.  50% less urine leakage since starting PT.    Pertinent History prostate surgery 2010, hemorrhoids, DVT/PE on anti-coagulatant, diverticulosis, gall bladder and bladder surgery (cyst - cancer)    Diagnostic tests procedures: colonoscopy Dec 2020, endoscopy 2021, cystoscopy    Patient Stated Goals ease with BMs and minimize dripping of urine    Currently in Pain? No/denies                             [facility] Adult PT Treatment/Exercise - 05/27/20 0001       Neuro Re-ed    Neuro Re-ed Details  toileting techniques for breathing and bulging PF in SL, anterior tilt manual cue, PT gave TCs at abdomen for big belly hard belly      Manual Therapy   Manual Therapy Soft tissue mobilization    Soft tissue mobilization puborectalis ischemic pressure and massage for release and drop bil in SL, coccygeus bil                    PT Short Term Goals - 05/20/20 1245      PT SHORT TERM GOAL #1   Title Pt will be ind with initial HEP for improved global mobility, flexibilty and coordination of PF.    Status Achieved      PT SHORT TERM GOAL #2   Title Pt will be able to demo proper toileting posture and breathing to help bulge pelvic floor.    Status Achieved      PT SHORT TERM GOAL #3   Title Pt will be able to verbalize at least 3 consistent strategies he is using to implement the beginnings of a healthy bowel routine.    Status New             PT Long Term Goals - 05/20/20 1246      PT LONG TERM GOAL #1   Title Pt will be ind with advanced HEP and self-care strategies for PF function for toileting.    Status On-going      PT LONG TERM GOAL #2   Title Pt will be able to bulge the PF for improved defecation success.    Status On-going      PT LONG TERM GOAL #3   Title Pt will report reduced need for OTC aids for BM regularity by at least 50%    Status On-going      PT LONG TERM GOAL #4   Title Pt will report Type 3 or Type 4 BM at least 2x/week without signif need to strain to defecate.    Status On-going      PT LONG TERM GOAL #5   Title Pt will demo improved trunk and hip mobility to [facility] so he can achieve good toileting posture and optimize gut motility through movement.    Status On-going                 Plan - 05/27/20 1242    Clinical Impression Statement Pt with improved tone/tension in bil pubococcygeus noted by PT today.  He has greater ease with BMs and notes they are consistently soft  and as thick as  his thumb vs ribbons.  He reports at least 50% less urinary leakage since starting PT.  He continues to need neuro re-ed for generating a bulge and coordinating with breath and vocalizations to improve bulge.  PT noted he need a slight anterior pelvic tilt to better align rectum with anus and get coccyx out of the way; he was more able to relax his anal canal in this posture.  He is more aware of when he is contracting vs bulging and can catch himself.  He has trouble generating signif bulge pressure.  PT encouraged stretching before having a BM and to work on these small postural changes on toilet.  Continue along POC with ongoing assessment.    Comorbidities PMH: hemorrhoids, DVT/PE on anti-coagulatant, diverticulosis PSH: prostatectomy, repair of perirectal fistual    PT Frequency 1x / week    PT Duration 12 weeks    PT Treatment/Interventions ADLs/Self Care Home Management;Biofeedback;Electrical Stimulation;Moist Heat;Neuromuscular re-education;Balance training;Functional mobility training;Therapeutic activities;Therapeutic exercise;Manual techniques;Patient/family education;Dry needling;Passive range of motion;Joint Manipulations;Spinal Manipulations    PT Next Visit Plan work on exhale/bulge consistency (tends to initially bulge but then clench), myofascial release ribcage and lumbar, puborectralis release    PT Home Exercise Plan Access Code: [email or web address], abd massage, visualization for PF drop/release, 5 in/5 out breathing into pelvic bowl, palpate anus on toilet to see if bulging    Consulted and Agree with Plan of Care Patient           Patient will benefit from skilled therapeutic intervention in order to improve the following deficits and impairments:     Visit Diagnosis: Other lack of coordination  Cramp and spasm     Problem List Patient Active Problem List   Diagnosis Date Noted  . Thoracic aortic aneurysm ([place]) 04/05/2016  . Aortic insufficiency 04/05/2016  . Prolapsed  internal hemorrhoids, grade 2 03/31/2014  . Upper abdominal pain - chronic 03/31/2014  . Dysphagia 03/31/2014  . Antiphospholipid antibody syndrome ([place]) 05/22/2012  . Pulmonary embolus ([place]) 11/11/2011  . DVT, lower extremity, distal 11/11/2011  . Family history of malignant neoplasm of gastrointestinal tract 10/18/2011  . GERD (gastroesophageal reflux disease) 06/23/2011  . IBS (irritable bowel syndrome) 06/23/2011  . Premature ventricular contractions 06/22/2011  . Malignant neoplasm of bladder ([place]) 12/12/2008  . PROSTATE CANCER, HX OF 12/12/2008  . PULMONARY EMBOLISM, HX OF 12/12/2008  . HYPERLIPIDEMIA 01/05/2008  . ANXIETY DEPRESSION 01/05/2008  . Essential hypertension 01/05/2008  . Hypertensive heart disease without heart failure 01/05/2008  . CHOLELITHIASIS, ASYMPTOMATIC 01/05/2008  . SLEEP APNEA 01/05/2008  . SYSTOLIC MURMUR 63/14/9702  . Hx of adenomatous colonic polyps 10/14/2003   Baruch Merl, PT 05/27/20 12:48 PM   [facility] [address] [place], [place], [address] Phone: [phone]   Fax:  [phone]  Name: [name] MRN: [id number] Date of Birth: 09/14/1945

## 2020-06-01 DIAGNOSIS — Z20822 Contact with and (suspected) exposure to covid-19: Secondary | ICD-10-CM | POA: Diagnosis not present

## 2020-06-03 ENCOUNTER — Ambulatory Visit: Payer: Medicare Other | Admitting: Surgery

## 2020-06-03 ENCOUNTER — Ambulatory Visit
Admission: RE | Admit: 2020-06-03 | Discharge: 2020-06-03 | Disposition: A | Discharge status: Home / Self Care | Payer: Medicare Other | Source: Ambulatory Visit | Attending: Surgery | Admitting: Surgery

## 2020-06-03 ENCOUNTER — Encounter: Payer: Self-pay | Admitting: Surgery

## 2020-06-03 ENCOUNTER — Other Ambulatory Visit: Payer: Self-pay

## 2020-06-03 VITALS — BP 145/74 | HR 63 | Temp 97.6°F | Resp 20 | Ht 71.0 in | Wt 217.0 lb

## 2020-06-03 DIAGNOSIS — I712 Thoracic aortic aneurysm, without rupture, unspecified: Secondary | ICD-10-CM

## 2020-06-03 DIAGNOSIS — I700 Atherosclerosis of aorta: Secondary | ICD-10-CM | POA: Diagnosis not present

## 2020-06-03 MED ORDER — IOPAMIDOL (ISOVUE-370) INJECTION 76%
75.0000 mL | Freq: Once | INTRAVENOUS | Status: AC | PRN
  Administered 2020-06-03: 75 mL via INTRAVENOUS

## 2020-06-03 NOTE — Progress Notes (Signed)
HPI:  The patient is a 74 year old gentleman who returns for follow-up of a stable 4.5 cm fusiform ascending aortic aneurysm.  He feels well without chest pain or shortness of breath.  He has a brother who has an ascending aortic aneurysm and his mother also had an ascending aortic aneurysm of unknown size but died in her 29s unrelated to the aneurysm.   Current Outpatient Medications  Medication Sig Dispense Refill   ALPRAZolam (XANAX) 1 MG tablet Take 1 mg by mouth at bedtime as needed for anxiety.     Cholecalciferol (VITAMIN D3) 50 MCG (2000 UT) capsule      co-enzyme Q-10 30 MG capsule Take 30 mg by mouth every evening.      CRESTOR 5 MG tablet TAKE 1 TABLET BY MOUTH EVERY DAY (Patient taking differently: TAKE 1 TABLET BY MOUTH EVERY DAY-- takes in am) 30 tablet 8   fish oil-omega-3 fatty acids 1000 MG capsule Take 1 g by mouth daily.      fluticasone (FLONASE) 50 MCG/ACT nasal spray 2 sprays at bedtime.     hydrochlorothiazide (HYDRODIURIL) 25 MG tablet Take 25 mg by mouth daily.     hydrocortisone (ANUSOL-HC) 2.5 % rectal cream PLACE RECTALLY AT BEDTIME AS NEEDED FOR HEMORRHOIDS. 30 g 1   Hypertonic Nasal Wash (SINUS RINSE BOTTLE KIT) PACK Place into the nose at bedtime.       methylcellulose oral powder Take 1 packet by mouth 2 (two) times daily.     metoprolol tartrate (LOPRESSOR) 100 MG tablet TAKE 1 TABLET BY MOUTH TWICE A DAY 180 tablet 3   Multiple Vitamin (MULTIVITAMIN) tablet Take 1 tablet by mouth daily.       oxymetazoline (AFRIN) 0.05 % nasal spray Place 2 sprays into the nose 2 (two) times daily.       pantoprazole (PROTONIX) 40 MG tablet TAKE 1 TABLET BY MOUTH EVERY DAY 90 tablet 3   ramipril (ALTACE) 10 MG capsule TAKE 1 CAPSULE (10 MG TOTAL) BY MOUTH 2 (TWO) TIMES DAILY 180 capsule 3   Rivaroxaban (XARELTO) 20 MG TABS Take 20 mg by mouth daily.     sertraline (ZOLOFT) 100 MG tablet Take 100 mg by mouth daily.     nystatin (MYCOSTATIN) 100000  UNIT/ML suspension Take by mouth.     No current facility-administered medications for this visit.     Physical Exam: BP (!) 145/74    Pulse 63    Temp 97.6 F (36.4 C) (Skin)    Resp 20    Ht _0  (1.803 m)    Wt 217 lb (98.4 kg)    SpO2 93% Comment: RA   BMI 30.27 kg/m  He looks well. Cardiac exam shows regular rate and rhythm with normal heart sounds.  There is no murmur.   Diagnostic Tests:  CLINICAL DATA:  Thoracic aortic aneurysm.  EXAM: CT ANGIOGRAPHY CHEST WITH CONTRAST  TECHNIQUE: Multidetector CT imaging of the chest was performed using the standard protocol during bolus administration of intravenous contrast. Multiplanar CT image reconstructions and MIPs were obtained to evaluate the vascular anatomy.  CONTRAST:  44m ISOVUE-370 IOPAMIDOL (ISOVUE-370) INJECTION 76%  COMPARISON:  June 05, 2019.  FINDINGS: Cardiovascular: Atherosclerosis of thoracic aorta is noted without dissection. Great vessels are widely patent without significant stenosis. Grossly stable 4.5 cm ascending thoracic aortic aneurysm is noted. Normal cardiac size. No pericardial effusion.  Mediastinum/Nodes: No enlarged mediastinal, hilar, or axillary lymph nodes. Thyroid gland, trachea, and esophagus demonstrate no  significant findings.  Lungs/Pleura: Lungs are clear. No pleural effusion or pneumothorax.  Upper Abdomen: No acute abnormality.  Musculoskeletal: No chest wall abnormality. No acute or significant osseous findings.  Review of the MIP images confirms the above findings.  IMPRESSION: 1. Grossly stable 4.5 cm ascending thoracic aortic aneurysm. Ascending thoracic aortic aneurysm. Recommend semi-annual imaging followup by CTA or MRA and referral to cardiothoracic surgery if not already obtained. This recommendation follows 2010 ACCF/AHA/AATS/ACR/ASA/SCA/SCAI/SIR/STS/SVM Guidelines for the Diagnosis and Management of Patients With Thoracic Aortic  Disease. Circulation. 2010; 121: O709-G283. Aortic aneurysm NOS (ICD10-I71.9). 2. No acute abnormality seen in the chest.  Aortic Atherosclerosis (ICD10-I70.0).   Electronically Signed   By: Marijo Conception M.D.   On: 06/03/2020 11:44   ECHOCARDIOGRAM REPORT       Patient Name:  [name] Date of Exam: 05/01/2020  Medical Rec #: [id number]    Height:    71.0 in  Accession #:  [id number]    Weight:    216.0 lb  Date of Birth: 01/01/1946    BSA:     2.179 m  Patient Age:  15 years     BP:      124/64 mmHg  Patient Gender: M        HR:      65 bpm.  Exam Location: [place]   Procedure: 2D Echo, 3D Echo, Cardiac Doppler and Color Doppler   Indications:  I35.9 Aortic Valve Disorder    History:    Patient has prior history of Echocardiogram examinations,  most         recent 05/08/2019. TIA, Arrythmias:PVC,  Signs/Symptoms:Murmur;         Risk Factors:Current Smoker, Dyslipidemia, Hypertension,  Family         History of Coronary Artery Disease and Sleep Apnea.  Ascending         Aorta Dilation (4.5cm), History of DVT and Pulmonary  Embolism.    Sonographer:  Deliah Boston RDCS  Referring Phys: [place]    1. Left ventricular ejection fraction, by estimation, is 60 to 65%. The  left ventricle has normal function. The left ventricle has no regional  wall motion abnormalities. Left ventricular diastolic parameters are  consistent with Grade I diastolic  dysfunction (impaired relaxation).  2. Right ventricular systolic function is normal. The right ventricular  size is normal. Tricuspid regurgitation signal is inadequate for assessing  PA pressure.  3. The mitral valve is normal in structure. No evidence of mitral valve  regurgitation. No evidence of mitral stenosis.  4. The aortic valve is tricuspid. Aortic valve  regurgitation is mild to  moderate. Mild aortic valve sclerosis is present, with no evidence of  aortic valve stenosis. Aortic regurgitation is moderate by PHT measuring  409 msec bu visually appears mild.  5. Aortic dilatation noted. There is mild dilatation of the aortic root,  measuring 41 mm. There is moderate dilatation of the ascending aorta,  measuring 45 mm.  6. The inferior vena cava is normal in size with greater than 50%  respiratory variability, suggesting right atrial pressure of 3 mmHg.   FINDINGS  Left Ventricle: Left ventricular ejection fraction, by estimation, is 60  to 65%. The left ventricle has normal function. The left ventricle has no  regional wall motion abnormalities. The left ventricular internal cavity  size was normal in size. There is  no left ventricular hypertrophy. Left ventricular diastolic parameters  are consistent with Grade  I diastolic dysfunction (impaired relaxation).  Normal left ventricular filling pressure.   Right Ventricle: The right ventricular size is normal. No increase in  right ventricular wall thickness. Right ventricular systolic function is  normal. Tricuspid regurgitation signal is inadequate for assessing PA  pressure.   Left Atrium: Left atrial size was normal in size.   Right Atrium: Right atrial size was normal in size.   Pericardium: There is no evidence of pericardial effusion.   Mitral Valve: The mitral valve is normal in structure. No evidence of  mitral valve regurgitation. No evidence of mitral valve stenosis.   Tricuspid Valve: The tricuspid valve is normal in structure. Tricuspid  valve regurgitation is not demonstrated. No evidence of tricuspid  stenosis.   Aortic Valve: The aortic valve is tricuspid. Aortic valve regurgitation is  mild to moderate. Aortic regurgitation PHT measures 409 msec. Mild aortic  valve sclerosis is present, with no evidence of aortic valve stenosis.   Pulmonic Valve: The pulmonic  valve was normal in structure. Pulmonic valve  regurgitation is not visualized. No evidence of pulmonic stenosis.   Aorta: Aortic dilatation noted. There is mild dilatation of the aortic  root, measuring 41 mm. There is moderate dilatation of the ascending  aorta, measuring 45 mm.   Venous: The inferior vena cava is normal in size with greater than 50%  respiratory variability, suggesting right atrial pressure of 3 mmHg.   IAS/Shunts: No atrial level shunt detected by color flow Doppler.     LEFT VENTRICLE  PLAX 2D  LVIDd:     5.00 cm Diastology  LVIDs:     3.70 cm LV e' medial:  10.30 cm/s  LV PW:     1.20 cm LV E/e' medial: 5.0  LV IVS:    0.80 cm LV e' lateral:  11.10 cm/s  LVOT diam:   2.40 cm LV E/e' lateral: 4.7  LV SV:     104  LV SV Index:  48  LVOT Area:   4.52 cm     RIGHT VENTRICLE  RV S prime:   10.80 cm/s  TAPSE (M-mode): 2.8 cm   LEFT ATRIUM       Index    RIGHT ATRIUM      Index  LA diam:    3.60 cm 1.65 cm/m RA Area:   16.50 cm  LA Vol (A2C):  86.2 ml 39.57 ml/m RA Volume:  36.60 ml 16.80 ml/m  LA Vol (A4C):  43.7 ml 20.06 ml/m  LA Biplane Vol: 68.3 ml 31.35 ml/m  AORTIC VALVE  LVOT Vmax:  98.30 cm/s  LVOT Vmean: 68.400 cm/s  LVOT VTI:  0.230 m  AI PHT:   409 msec    AORTA  Ao Root diam: 4.10 cm  Ao Asc diam: 4.45 cm   MITRAL VALVE  MV Area (PHT): cm     SHUNTS  MV Decel Time: 280 msec  Systemic VTI: 0.23 m  MV E velocity: 51.65 cm/s Systemic Diam: 2.40 cm  MV A velocity: 87.25 cm/s  MV E/A ratio: 0.59   Andrew Him MD  Electronically signed by Andrew Him MD  Signature Date/Time: 05/01/2020/4:49:51 PM     Impression:  This 74 year old gentleman has a stable 4.5 cm fusiform ascending aortic aneurysm.  He has a trileaflet aortic valve with no stenosis and mild to moderate aortic insufficiency.  He remains asymptomatic with normal left ventricular  systolic function and a left ventricular internal diameter of 5 cm.  His aneurysm is well  below the surgical threshold of 5.5 cm.  I reviewed the CT images with Ford and answered his questions.  I stressed the importance of continued good blood pressure control and preventing further enlargement and acute aortic dissection.  Plan:  I will plan to see Ford back in 1 year with a CTA of the chest for follow-up of his aneurysm.  He will continue to follow-up with Dr. Gwenlyn Found for his aortic insufficiency with periodic echocardiograms.  I spent 15 minutes performing this established patient evaluation and > 50% of this time was spent face to face counseling and coordinating the care of this patient's aortic aneurysm.    Andrew Pollack, MD [facility] [phone]

## 2020-06-08 ENCOUNTER — Other Ambulatory Visit: Payer: Self-pay

## 2020-06-08 ENCOUNTER — Encounter (INDEPENDENT_AMBULATORY_CARE_PROVIDER_SITE_OTHER): Payer: Self-pay | Admitting: Otolaryngology

## 2020-06-08 ENCOUNTER — Ambulatory Visit (INDEPENDENT_AMBULATORY_CARE_PROVIDER_SITE_OTHER): Payer: Medicare Other | Admitting: Otolaryngology

## 2020-06-08 VITALS — Temp 97.3°F

## 2020-06-08 DIAGNOSIS — H6123 Impacted cerumen, bilateral: Secondary | ICD-10-CM | POA: Diagnosis not present

## 2020-06-08 DIAGNOSIS — K120 Recurrent oral aphthae: Secondary | ICD-10-CM | POA: Diagnosis not present

## 2020-06-08 NOTE — Progress Notes (Signed)
HPI: [name] is a 74 y.o. male who returns today for evaluation of some sores around his tongue.  He also has blockage of his hearing secondary to cerumen buildup.  He was last cleaned 3 to 4 months ago.  He states that he bit the right side of his tongue and had some swelling and pain but this is doing better.  He also noticed some soreness underneath the tongue and the floor mouth region. He smokes cigars occasionally but does not use tobacco products otherwise.Marland Kitchen  Past Medical History:  Diagnosis Date  . Antiphospholipid antibody syndrome ([place]) 05/22/2012   Found subsequent to recurrent PE 9/13  . Arthritis   . Bladder cancer ([place]) dx'd 2009   surg only  . Chronic anal fissure    w/ recurrence's  . Chronic anxiety   . Depression   . ED (erectile dysfunction)   . Esophageal candidiasis ([place]) 03/14/2017  . Fatty liver   . GERD (gastroesophageal reflux disease)   . Heart murmur   . Hiatal hernia   . History of adenomatous polyp of colon    2005  . History of bladder cancer urologist-  dr grapey   papillary TCC  s/p  turbt  . History of DVT (deep vein thrombosis)    post prostate surgery 2010  right LLE  . History of esophageal stricture    w/ dilation  . History of gastric polyp    benign 2010  . History of prostate cancer urologist- dr grapey/  oncology-  dr Alen Blew   Stage  T1c , Gleason 3+3;   s/p  prostatectomy 03-08-210//  per pt currently PSA nondetectable   . History of pulmonary embolus (PE)    post prostate surgery 2010  and 2013 x2 right side per CT scan (found to have lupus anticoaglated )  . History of TIA (transient ischemic attack)    07/ 2012 noted on scans remote probable tia's in  age 40's  . History of TMJ syndrome   . HTN (hypertension)   . Hyperlipidemia   . Lupus anticoagulant positive    found 2013 when pt had PE  . Moderate aortic regurgitation    with no  stenosis   . OSA on CPAP    per study severe osa 12-14-2010  . Prolapsed internal  hemorrhoids, grade 2 03/31/2014  . prostate ca dx'd 2010   surg only  . RLS (restless legs syndrome)   . RLS (restless legs syndrome)   . Sleep apnea   . Symptomatic PVCs cardiologist-  dr berry   intermittant  . Wears glasses    Past Surgical History:  Procedure Laterality Date  . APPENDECTOMY  age 38   and Left Inguinal Hernia Repair  . CARDIOVASCULAR STRESS TEST  06-30-2011   dr berry   normal nuclear study/  normal LV function and wall motion, ef 56%  . COLONOSCOPY  last one 10-19-2011  . CYSTO/ BLADDER BX/  RETROGRADE PYELOGRAM/  TRANSRECTAL PROSTATE BX'S  07-30-2008  . ESOPHAGOGASTRODUODENOSCOPY  last one 06-27-2011  . EVALUATION UNDER ANESTHESIA WITH FISTULECTOMY N/A 01/14/2016   Procedure: EXAM UNDER ANESTHESIA WITH REPAIR OF PERIRECTAL FISTULA  (LIFT);  Surgeon: Michael Boston, MD;  Location: [facility];  Service: General;  Laterality: N/A;  . LAPAROSCOPIC CHOLECYSTECTOMY  05-25-2007   and Excision cyst back of neck  . ROBOT ASSISTED LAPAROSCOPIC RADICAL PROSTATECTOMY  10-20-2008  . STRABISMUS SURGERY Bilateral age 37  . TONSILLECTOMY  age 15  . TRANSTHORACIC  ECHOCARDIOGRAM  03-23-2015   dr berry   grade 1 diastolic dysfunction, ef 47-42%/  mild AV thickened leaflets with no stenosis,  moderate AR/  mild ascending aorta ilatation3.9cm/  trivial MR and TR  . UPPER GASTROINTESTINAL ENDOSCOPY     Social History   Socioeconomic History  . Marital status: Married    Spouse name: Not on file  . Number of children: 2  . Years of education: Not on file  . Highest education level: Not on file  Occupational History  . Occupation: Art gallery manager  . Occupation: FIELD APPLICATIONS    Employer: [employer]  Tobacco Use  . Smoking status: Current Some Day Smoker    Years: 51.00    Types: Cigars  . Smokeless tobacco: Never Used  . Tobacco comment: 5 cigars /week--/  smoked cigarettes for 10 years quit 1990's  Vaping Use  . Vaping Use: Never used  Substance and  Sexual Activity  . Alcohol use: Yes    Alcohol/week: 28.0 standard drinks    Types: 14 Glasses of wine, 14 Cans of beer per week    Comment: 2 beers a day  . Drug use: No  . Sexual activity: Not on file  Other Topics Concern  . Not on file  Social History Narrative   Married to [place] [name])   Gets regular exercise.   Daily caffeine- 2 cups daily.   Education: MS Estate manager/land agent   2 kids + grandchildren   Enjoys on track car driving (e.g, [employer])      Social Determinants of Health   Financial Resource Strain:   . Difficulty of Paying Living Expenses: Not on file  Food Insecurity:   . Worried About Charity fundraiser in the Last Year: Not on file  . Ran Out of Food in the Last Year: Not on file  Transportation Needs:   . Lack of Transportation (Medical): Not on file  . Lack of Transportation (Non-Medical): Not on file  Physical Activity:   . Days of Exercise per Week: Not on file  . Minutes of Exercise per Session: Not on file  Stress:   . Feeling of Stress : Not on file  Social Connections:   . Frequency of Communication with Friends and Family: Not on file  . Frequency of Social Gatherings with Friends and Family: Not on file  . Attends Religious Services: Not on file  . Active Member of Clubs or Organizations: Not on file  . Attends Archivist Meetings: Not on file  . Marital Status: Not on file   Family History  Problem Relation Age of Onset  . Hypertension Mother   . Hypertension Father   . Stroke Father   . Colon polyps Father   . Colon cancer Father        questionable  . Diabetes Maternal Grandmother   . Heart disease Maternal Grandmother   . Colon polyps Brother   . Crohn's disease Brother   . Cirrhosis Other        [employer]  . Esophageal cancer Neg Hx   . Rectal cancer Neg Hx   . Stomach cancer Neg Hx    Allergies  Allergen Reactions  . Aspirin Other (See Comments)    REACTION: gi upset with higher dose  . Penicillins  Itching   Prior to Admission medications   Medication Sig Start Date End Date Taking? Authorizing Provider  ALPRAZolam Duanne Moron) 1 MG tablet Take 1 mg by mouth at bedtime as needed  for anxiety.   Yes [provider]  Cholecalciferol (VITAMIN D3) 50 MCG (2000 UT) capsule  08/01/14  Yes [provider]  co-enzyme Q-10 30 MG capsule Take 30 mg by mouth every evening.    Yes [provider]  CRESTOR 5 MG tablet TAKE 1 TABLET BY MOUTH EVERY DAY Patient taking differently: TAKE 1 TABLET BY MOUTH EVERY DAY-- takes in am 02/23/15  Yes [name], MD  fish oil-omega-3 fatty acids 1000 MG capsule Take 1 g by mouth daily.    Yes [provider]  fluticasone (FLONASE) 50 MCG/ACT nasal spray 2 sprays at bedtime. 01/14/19  Yes [provider]  hydrochlorothiazide (HYDRODIURIL) 25 MG tablet Take 25 mg by mouth daily.   Yes [provider]  hydrocortisone (ANUSOL-HC) 2.5 % rectal cream PLACE RECTALLY AT BEDTIME AS NEEDED FOR HEMORRHOIDS. 05/11/20  Yes Gatha Mayer, MD  Hypertonic Nasal Wash (SINUS RINSE BOTTLE KIT) PACK Place into the nose at bedtime.     Yes [provider]  methylcellulose oral powder Take 1 packet by mouth 2 (two) times daily.   Yes [provider]  metoprolol tartrate (LOPRESSOR) 100 MG tablet TAKE 1 TABLET BY MOUTH TWICE A DAY 05/11/20  Yes [name], MD  Multiple Vitamin (MULTIVITAMIN) tablet Take 1 tablet by mouth daily.     Yes [provider]  oxymetazoline (AFRIN) 0.05 % nasal spray Place 2 sprays into the nose 2 (two) times daily.     Yes [provider]  pantoprazole (PROTONIX) 40 MG tablet TAKE 1 TABLET BY MOUTH EVERY DAY 05/11/20  Yes Gatha Mayer, MD  ramipril (ALTACE) 10 MG capsule TAKE 1 CAPSULE (10 MG TOTAL) BY MOUTH 2 (TWO) TIMES DAILY 03/19/20  Yes [name], MD  Rivaroxaban (XARELTO) 20 MG TABS Take 20 mg by mouth daily.   Yes [provider]  sertraline  (ZOLOFT) 100 MG tablet Take 100 mg by mouth daily.   Yes [provider]  nystatin (MYCOSTATIN) 100000 UNIT/ML suspension Take by mouth. 01/16/20   [provider]     Positive ROS: Otherwise negative  All other systems have been reviewed and were otherwise negative with the exception of those mentioned in the HPI and as above.  Physical Exam: Constitutional: Alert, well-appearing, no acute distress Ears: External ears without lesions or tenderness.  He has large amount of wax buildup in both ear canals that was cleaned with curette and suction's.  TMs were clear bilaterally. Nasal: External nose without lesions. Clear nasal passages Oral: Lips and gums without lesions. Tongue is normal to appearance on both sides.  It is soft to palpation.  There are no mucosal lesions noted on the tongue.  However along the floor mouth in the midline there is a small superficial ulcer and a little bit of leukoplakia right at the base of the frenulum.  It is soft to palpation with no real ulceration or induration. Neck: No palpable adenopathy or masses Respiratory: Breathing comfortably  Skin: No facial/neck lesions or rash noted.  Cerumen impaction removal  Date/Time: 06/08/2020 2:31 PM Performed by: Rozetta Nunnery, MD Authorized by: Rozetta Nunnery, MD   Consent:    Consent obtained:  Verbal   Consent given by:  Patient   Risks discussed:  Pain and bleeding Procedure details:    Location:  L ear and R ear   Procedure type: curette and suction   Post-procedure details:    Inspection:  TM  intact and canal normal   Hearing quality:  Improved   Patient tolerance of procedure:  Tolerated well, no immediate complications Comments:     TMs are clear otherwise.    Assessment: Floor of mouth leukoplakia probably aphthous type ulcer. Cerumen buildup in both ear canals.  Plan: Ear canals were cleaned in the office today. Discussed with him concerning using medication  for aphthous ulcers for the floor mouth and he will follow-up in 5 to 6 weeks for recheck.   Radene Journey, MD

## 2020-06-16 ENCOUNTER — Encounter: Payer: Self-pay | Admitting: Physical Therapy

## 2020-06-16 ENCOUNTER — Ambulatory Visit: Payer: Medicare Other | Attending: Internal Medicine | Admitting: Physical Therapy

## 2020-06-16 ENCOUNTER — Other Ambulatory Visit: Payer: Self-pay

## 2020-06-16 DIAGNOSIS — R278 Other lack of coordination: Secondary | ICD-10-CM | POA: Diagnosis not present

## 2020-06-16 DIAGNOSIS — R252 Cramp and spasm: Secondary | ICD-10-CM | POA: Diagnosis not present

## 2020-06-16 NOTE — Therapy (Signed)
[facility] [address] [place], [place], [address] Phone: [phone]   Fax:  [phone]  Physical Therapy Treatment  Patient Details  Name: [name] MRN: [id number] Date of Birth: Jan 20, 1946 Referring Provider (PT): Gatha Mayer, MD   Encounter Date: 06/16/2020   PT End of Session - 06/16/20 1017    Visit Number 5    Date for PT Re-Evaluation 07/29/20    Authorization Type [employer] Medicare    PT Start Time 1018    PT Stop Time 1058    PT Time Calculation (min) 40 min    Activity Tolerance Patient tolerated treatment well    Behavior During Therapy [facility] for tasks assessed/performed           Past Medical History:  Diagnosis Date  . Antiphospholipid antibody syndrome ([place]) 05/22/2012   Found subsequent to recurrent PE 9/13  . Arthritis   . Bladder cancer ([place]) dx'd 2009   surg only  . Chronic anal fissure    w/ recurrence's  . Chronic anxiety   . Depression   . ED (erectile dysfunction)   . Esophageal candidiasis ([place]) 03/14/2017  . Fatty liver   . GERD (gastroesophageal reflux disease)   . Heart murmur   . Hiatal hernia   . History of adenomatous polyp of colon    2005  . History of bladder cancer urologist-  dr grapey   papillary TCC  s/p  turbt  . History of DVT (deep vein thrombosis)    post prostate surgery 2010  right LLE  . History of esophageal stricture    w/ dilation  . History of gastric polyp    benign 2010  . History of prostate cancer urologist- dr grapey/  oncology-  dr Alen Blew   Stage  T1c , Gleason 3+3;   s/p  prostatectomy 03-08-210//  per pt currently PSA nondetectable   . History of pulmonary embolus (PE)    post prostate surgery 2010  and 2013 x2 right side per CT scan (found to have lupus anticoaglated )  . History of TIA (transient ischemic attack)    07/ 2012 noted on scans remote probable tia's in  age 55's  . History of TMJ syndrome   . HTN (hypertension)   .  Hyperlipidemia   . Lupus anticoagulant positive    found 2013 when pt had PE  . Moderate aortic regurgitation    with no  stenosis   . OSA on CPAP    per study severe osa 12-14-2010  . Prolapsed internal hemorrhoids, grade 2 03/31/2014  . prostate ca dx'd 2010   surg only  . RLS (restless legs syndrome)   . RLS (restless legs syndrome)   . Sleep apnea   . Symptomatic PVCs cardiologist-  dr berry   intermittant  . Wears glasses     Past Surgical History:  Procedure Laterality Date  . APPENDECTOMY  age 20   and Left Inguinal Hernia Repair  . CARDIOVASCULAR STRESS TEST  06-30-2011   dr berry   normal nuclear study/  normal LV function and wall motion, ef 56%  . COLONOSCOPY  last one 10-19-2011  . CYSTO/ BLADDER BX/  RETROGRADE PYELOGRAM/  TRANSRECTAL PROSTATE BX'S  07-30-2008  . ESOPHAGOGASTRODUODENOSCOPY  last one 06-27-2011  . EVALUATION UNDER ANESTHESIA WITH FISTULECTOMY N/A 01/14/2016   Procedure: EXAM UNDER ANESTHESIA WITH REPAIR OF PERIRECTAL FISTULA  (LIFT);  Surgeon: Michael Boston, MD;  Location: [facility];  Service: General;  Laterality: N/A;  . LAPAROSCOPIC CHOLECYSTECTOMY  05-25-2007   and Excision cyst back of neck  . ROBOT ASSISTED LAPAROSCOPIC RADICAL PROSTATECTOMY  10-20-2008  . STRABISMUS SURGERY Bilateral age 57  . TONSILLECTOMY  age 89  . TRANSTHORACIC ECHOCARDIOGRAM  03-23-2015   dr berry   grade 1 diastolic dysfunction, ef 29-51%/  mild AV thickened leaflets with no stenosis,  moderate AR/  mild ascending aorta ilatation3.9cm/  trivial MR and TR  . UPPER GASTROINTESTINAL ENDOSCOPY      There were no vitals filed for this visit.   Subjective Assessment - 06/16/20 1019    Subjective I have 50% improvement since starting PT.  It is lasting between appointments.    Pertinent History prostate surgery 2010, hemorrhoids, DVT/PE on anti-coagulatant, diverticulosis, gall bladder and bladder surgery (cyst - cancer)    Diagnostic tests procedures:  colonoscopy Dec 2020, endoscopy 2021, cystoscopy    Patient Stated Goals ease with BMs and minimize dripping of urine    Currently in Pain? No/denies                             [facility] Adult PT Treatment/Exercise - 06/16/20 0001      Neuro Re-ed    Neuro Re-ed Details  sidelying contract/relax/bulge training, push my finger out with cue "as though passing gas" better than cue "as though pooping"      Exercises   Exercises Lumbar;Knee/Hip      Lumbar Exercises: Stretches   Lower Trunk Rotation 3 reps;10 seconds    Lower Trunk Rotation Limitations barrel breathing each rep    Other Lumbar Stretch Exercise open books x 5 bil with barrel breath, improved ribcage expansion noted      Lumbar Exercises: Aerobic   Recumbent Bike L2 x 3' PT present to discuss progress      Lumbar Exercises: Quadruped   Other Quadruped Lumbar Exercises table top thoracic rot bil x 5, then thread needle 1x15 sec holds bil    Other Quadruped Lumbar Exercises prayer with anterior tilt vs tucked rocking side to side x 3 reps 5 sec each                    PT Short Term Goals - 05/20/20 1245      PT SHORT TERM GOAL #1   Title Pt will be ind with initial HEP for improved global mobility, flexibilty and coordination of PF.    Status Achieved      PT SHORT TERM GOAL #2   Title Pt will be able to demo proper toileting posture and breathing to help bulge pelvic floor.    Status Achieved      PT SHORT TERM GOAL #3   Title Pt will be able to verbalize at least 3 consistent strategies he is using to implement the beginnings of a healthy bowel routine.    Status New             PT Long Term Goals - 06/16/20 1057      PT LONG TERM GOAL #1   Title Pt will be ind with advanced HEP and self-care strategies for PF function for toileting.    Status On-going      PT LONG TERM GOAL #2   Title Pt will be able to bulge the PF for improved defecation success.    Status Achieved      PT  LONG TERM GOAL #3  Title Pt will report reduced need for OTC aids for BM regularity by at least 50%    Status Achieved      PT LONG TERM GOAL #4   Title Pt will report Type 3 or Type 4 BM at least 2x/week without signif need to strain to defecate.    Status On-going      PT LONG TERM GOAL #5   Title Pt will demo improved trunk and hip mobility to [facility] so he can achieve good toileting posture and optimize gut motility through movement.    Status On-going                 Plan - 06/16/20 1029    Clinical Impression Statement Pt reports 50% improvement with BMs since starting PT.  Stool is still thin but easier to move, less straining.  No suppository use for 2 weeks since last visit.  Pt reports he can bulge consistently now but feels himself squeeze reflexively at anus which PT confirmed today during neuro re-ed.  Mobility of trunk and hips are improving.  Ribcage expansion with barrel breathing is much improved.  Pt reports toileting strategies are helping, especially anterior tilt sitting to get coccyx "out of the way."  He demo'd improved ability to push PT's finger out of rectal canal today with less need for cueing to avoid clench with exhale.  Continue along POC for ongoing neuro re-ed for toileting and mobility of trunk and hips.    Comorbidities PMH: hemorrhoids, DVT/PE on anti-coagulatant, diverticulosis PSH: prostatectomy, repair of perirectal fistual    PT Frequency 1x / week    PT Duration 12 weeks    PT Treatment/Interventions ADLs/Self Care Home Management;Biofeedback;Electrical Stimulation;Moist Heat;Neuromuscular re-education;Balance training;Functional mobility training;Therapeutic activities;Therapeutic exercise;Manual techniques;Patient/family education;Dry needling;Passive range of motion;Joint Manipulations;Spinal Manipulations    PT Next Visit Plan continue trunk/hip mobility and neuro re-ed for bulge training around hyperreflexive anal sphincter, revisit abdominal  massage as needed    PT Home Exercise Plan Access Code: [email or web address], abd massage, visualization for PF drop/release, 5 in/5 out breathing into pelvic bowl, palpate anus on toilet to see if bulging    Consulted and Agree with Plan of Care Patient           Patient will benefit from skilled therapeutic intervention in order to improve the following deficits and impairments:     Visit Diagnosis: Other lack of coordination  Cramp and spasm     Problem List Patient Active Problem List   Diagnosis Date Noted  . Thoracic aortic aneurysm ([place]) 04/05/2016  . Aortic insufficiency 04/05/2016  . Prolapsed internal hemorrhoids, grade 2 03/31/2014  . Upper abdominal pain - chronic 03/31/2014  . Dysphagia 03/31/2014  . Antiphospholipid antibody syndrome ([place]) 05/22/2012  . Pulmonary embolus ([place]) 11/11/2011  . DVT, lower extremity, distal 11/11/2011  . Family history of malignant neoplasm of gastrointestinal tract 10/18/2011  . GERD (gastroesophageal reflux disease) 06/23/2011  . IBS (irritable bowel syndrome) 06/23/2011  . Premature ventricular contractions 06/22/2011  . Malignant neoplasm of bladder ([place]) 12/12/2008  . PROSTATE CANCER, HX OF 12/12/2008  . PULMONARY EMBOLISM, HX OF 12/12/2008  . HYPERLIPIDEMIA 01/05/2008  . ANXIETY DEPRESSION 01/05/2008  . Essential hypertension 01/05/2008  . Hypertensive heart disease without heart failure 01/05/2008  . CHOLELITHIASIS, ASYMPTOMATIC 01/05/2008  . SLEEP APNEA 01/05/2008  . SYSTOLIC MURMUR 54/04/8118  . Hx of adenomatous colonic polyps 10/14/2003    Baruch Merl, PT 06/16/20 10:59 AM   [facility] [address], [place], [place], [address] Phone: [phone]   Fax:  [phone]  Name: [name] MRN: [id number] Date of Birth: 21-Sep-1945

## 2020-06-24 ENCOUNTER — Encounter: Payer: Self-pay | Admitting: Physical Therapy

## 2020-06-24 ENCOUNTER — Other Ambulatory Visit: Payer: Self-pay

## 2020-06-24 ENCOUNTER — Ambulatory Visit: Payer: Medicare Other | Admitting: Physical Therapy

## 2020-06-24 DIAGNOSIS — R252 Cramp and spasm: Secondary | ICD-10-CM

## 2020-06-24 DIAGNOSIS — R278 Other lack of coordination: Secondary | ICD-10-CM

## 2020-06-24 NOTE — Therapy (Signed)
[facility] [address], [place], [place], [address] Phone: [phone]   Fax:  [phone]  Physical Therapy Treatment  Patient Details  Name: [name] MRN: [id number] Date of Birth: [birth year] Referring Provider (PT): Gatha Mayer, MD   Encounter Date: 06/24/2020   PT End of Session - 06/24/20 1024    Visit Number 6    Date for PT Re-Evaluation 07/29/20    Authorization Type UHC Medicare    Progress Note Due on Visit 10    PT Start Time 1018    PT Stop Time 1056    PT Time Calculation (min) 38 min    Activity Tolerance Patient tolerated treatment well    Behavior During Therapy [facility] for tasks assessed/performed           Past Medical History:  Diagnosis Date  . Antiphospholipid antibody syndrome ([place]) 05/22/2012   Found subsequent to recurrent PE 9/13  . Arthritis   . Bladder cancer ([place]) dx'd 2009   surg only  . Chronic anal fissure    w/ recurrence's  . Chronic anxiety   . Depression   . ED (erectile dysfunction)   . Esophageal candidiasis ([place]) 03/14/2017  . Fatty liver   . GERD (gastroesophageal reflux disease)   . Heart murmur   . Hiatal hernia   . History of adenomatous polyp of colon    2005  . History of bladder cancer urologist-  dr grapey   papillary TCC  s/p  turbt  . History of DVT (deep vein thrombosis)    post prostate surgery 2010  right LLE  . History of esophageal stricture    w/ dilation  . History of gastric polyp    benign 2010  . History of prostate cancer urologist- dr grapey/  oncology-  dr Alen Blew   Stage  T1c , Gleason 3+3;   s/p  prostatectomy 03-08-210//  per pt currently PSA nondetectable   . History of pulmonary embolus (PE)    post prostate surgery 2010  and 2013 x2 right side per CT scan (found to have lupus anticoaglated )  . History of TIA (transient ischemic attack)    07/ 2012 noted on scans remote probable tia's in  age 16's  . History of TMJ  syndrome   . HTN (hypertension)   . Hyperlipidemia   . Lupus anticoagulant positive    found 2013 when pt had PE  . Moderate aortic regurgitation    with no  stenosis   . OSA on CPAP    per study severe osa 12-14-2010  . Prolapsed internal hemorrhoids, grade 2 03/31/2014  . prostate ca dx'd 2010   surg only  . RLS (restless legs syndrome)   . RLS (restless legs syndrome)   . Sleep apnea   . Symptomatic PVCs cardiologist-  dr berry   intermittant  . Wears glasses     Past Surgical History:  Procedure Laterality Date  . APPENDECTOMY  age 13   and Left Inguinal Hernia Repair  . CARDIOVASCULAR STRESS TEST  06-30-2011   dr berry   normal nuclear study/  normal LV function and wall motion, ef 56%  . COLONOSCOPY  last one 10-19-2011  . CYSTO/ BLADDER BX/  RETROGRADE PYELOGRAM/  TRANSRECTAL PROSTATE BX'S  07-30-2008  . ESOPHAGOGASTRODUODENOSCOPY  last one 06-27-2011  . EVALUATION UNDER ANESTHESIA WITH FISTULECTOMY N/A 01/14/2016   Procedure: EXAM UNDER ANESTHESIA WITH REPAIR OF PERIRECTAL FISTULA  (LIFT);  Surgeon: Remo Lipps  Gross, MD;  Location: [place];  Service: General;  Laterality: N/A;  . LAPAROSCOPIC CHOLECYSTECTOMY  05-25-2007   and Excision cyst back of neck  . ROBOT ASSISTED LAPAROSCOPIC RADICAL PROSTATECTOMY  10-20-2008  . STRABISMUS SURGERY Bilateral age 11  . TONSILLECTOMY  age 11  . TRANSTHORACIC ECHOCARDIOGRAM  03-23-2015   dr berry   grade 1 diastolic dysfunction, ef 78-29%/  mild AV thickened leaflets with no stenosis,  moderate AR/  mild ascending aorta ilatation3.9cm/  trivial MR and TR  . UPPER GASTROINTESTINAL ENDOSCOPY      There were no vitals filed for this visit.   Subjective Assessment - 06/24/20 1022    Subjective 60% improvement now.  I am definitely improving.  If I don't think about it, it is even better.  Difficutly with defecation is much less frequent - maybe 1x/week    Pertinent History prostate surgery 2010, hemorrhoids, DVT/PE on  anti-coagulatant, diverticulosis, gall bladder and bladder surgery (cyst - cancer)    Diagnostic tests procedures: colonoscopy Dec 2020, endoscopy 2021, cystoscopy    Patient Stated Goals ease with BMs and minimize dripping of urine    Currently in Pain? No/denies                             [facility] Adult PT Treatment/Exercise - 06/24/20 0001      Neuro Re-ed    Neuro Re-ed Details  sidelying contract/relax/bulge training, push my finger out with cue "as though passing gas" better than cue "as though pooping"      Exercises   Exercises Lumbar;Knee/Hip      Lumbar Exercises: Stretches   Lower Trunk Rotation 3 reps;10 seconds    Lower Trunk Rotation Limitations barrel breathing each rep    Other Lumbar Stretch Exercise open books x 5 bil with barrel breath, improved ribcage expansion noted    Other Lumbar Stretch Exercise LE windshield wipers x 20      Lumbar Exercises: Aerobic   Recumbent Bike L2 x 5' PT present to discuss progress      Lumbar Exercises: Standing   Other Standing Lumbar Exercises frog squat x 20 sec    Other Standing Lumbar Exercises holding inside of sink deep frog squat hanging through UEs, add C curve SB - 2x30 sec each      Lumbar Exercises: Quadruped   Other Quadruped Lumbar Exercises table top thoracic rot bil x 5, then thread needle 1x15 sec holds bil    Other Quadruped Lumbar Exercises prayer with anterior tilt vs tucked rocking side to side x 3 reps 5 sec each      Manual Therapy   Manual Therapy Soft tissue mobilization    Soft tissue mobilization bil levator ani internal release in SL                    PT Short Term Goals - 05/20/20 1245      PT SHORT TERM GOAL #1   Title Pt will be ind with initial HEP for improved global mobility, flexibilty and coordination of PF.    Status Achieved      PT SHORT TERM GOAL #2   Title Pt will be able to demo proper toileting posture and breathing to help bulge pelvic floor.     Status Achieved      PT SHORT TERM GOAL #3   Title Pt will be able to verbalize at least 3 consistent strategies  he is using to implement the beginnings of a healthy bowel routine.    Status New             PT Long Term Goals - 06/16/20 1057      PT LONG TERM GOAL #1   Title Pt will be ind with advanced HEP and self-care strategies for PF function for toileting.    Status On-going      PT LONG TERM GOAL #2   Title Pt will be able to bulge the PF for improved defecation success.    Status Achieved      PT LONG TERM GOAL #3   Title Pt will report reduced need for OTC aids for BM regularity by at least 50%    Status Achieved      PT LONG TERM GOAL #4   Title Pt will report Type 3 or Type 4 BM at least 2x/week without signif need to strain to defecate.    Status On-going      PT LONG TERM GOAL #5   Title Pt will demo improved trunk and hip mobility to [facility] so he can achieve good toileting posture and optimize gut motility through movement.    Status On-going                 Plan - 06/24/20 1056    Clinical Impression Statement Pt reports 60% improvement with ease with BMs.  He has a BM daily and has some difficulty 1x/week on average.  He continues to benefit from neuro re-ed for breath cueing with bulge.  He has hypersensitive anal reflex which he is learning to override with defecation.  He was able to demo improved bulge and reflex override today x 5 reps of pushing PT's finger out of anorectal canal.  PT gave new stretch for deep frog squat holding inside of sink.  PT encouraged daily stretching vs waiting for body aches to cue stretches.  Continue along POC.  Pt is on track to meet all goals by end of cert period.    Comorbidities PMH: hemorrhoids, DVT/PE on anti-coagulatant, diverticulosis PSH: prostatectomy, repair of perirectal fistual    PT Frequency 1x / week    PT Duration 12 weeks    PT Treatment/Interventions ADLs/Self Care Home Management;Biofeedback;Electrical  Stimulation;Moist Heat;Neuromuscular re-education;Balance training;Functional mobility training;Therapeutic activities;Therapeutic exercise;Manual techniques;Patient/family education;Dry needling;Passive range of motion;Joint Manipulations;Spinal Manipulations    PT Next Visit Plan continue neuro re-ed for defecation, sink frog stretch f/u, open books, lower trunk rotation    PT Home Exercise Plan Access Code: [email or web address], abd massage, visualization for PF drop/release, 5 in/5 out breathing into pelvic bowl, palpate anus on toilet to see if bulging           Patient will benefit from skilled therapeutic intervention in order to improve the following deficits and impairments:     Visit Diagnosis: Other lack of coordination  Cramp and spasm     Problem List Patient Active Problem List   Diagnosis Date Noted  . Thoracic aortic aneurysm ([place]) 04/05/2016  . Aortic insufficiency 04/05/2016  . Prolapsed internal hemorrhoids, grade 2 03/31/2014  . Upper abdominal pain - chronic 03/31/2014  . Dysphagia 03/31/2014  . Antiphospholipid antibody syndrome ([place]) 05/22/2012  . Pulmonary embolus ([place]) 11/11/2011  . DVT, lower extremity, distal 11/11/2011  . Family history of malignant neoplasm of gastrointestinal tract 10/18/2011  . GERD (gastroesophageal reflux disease) 06/23/2011  . IBS (irritable bowel syndrome) 06/23/2011  . Premature ventricular contractions 06/22/2011  .  Malignant neoplasm of bladder ([place]) 12/12/2008  . PROSTATE CANCER, HX OF 12/12/2008  . PULMONARY EMBOLISM, HX OF 12/12/2008  . HYPERLIPIDEMIA 01/05/2008  . ANXIETY DEPRESSION 01/05/2008  . Essential hypertension 01/05/2008  . Hypertensive heart disease without heart failure 01/05/2008  . CHOLELITHIASIS, ASYMPTOMATIC 01/05/2008  . SLEEP APNEA 01/05/2008  . SYSTOLIC MURMUR 38/75/6433  . Hx of adenomatous colonic polyps 10/14/2003    Baruch Merl, PT 06/24/20 10:59 AM   [facility] [address] [place], [place], [address] Phone: [phone]   Fax:  [phone]  Name: [name] MRN: [id number] Date of Birth: [birth year]

## 2020-07-01 ENCOUNTER — Other Ambulatory Visit: Payer: Self-pay

## 2020-07-01 ENCOUNTER — Ambulatory Visit: Payer: Medicare Other | Admitting: Physical Therapy

## 2020-07-01 ENCOUNTER — Encounter: Payer: Self-pay | Admitting: Physical Therapy

## 2020-07-01 DIAGNOSIS — R278 Other lack of coordination: Secondary | ICD-10-CM | POA: Diagnosis not present

## 2020-07-01 DIAGNOSIS — R252 Cramp and spasm: Secondary | ICD-10-CM | POA: Diagnosis not present

## 2020-07-01 NOTE — Patient Instructions (Addendum)
° °  The "Pelvic Drop" to Release Pelvic Floor Tension: Three Visualizations     Guided Meditation for Pelvic Floor Relaxation   [employer]   Pelvic Floor Release Stretches   [employer]    Pelvic Floor Release Stretches (NEW)   [employer]    Stress Management and Relaxation Techniques There are two divisions in the nervous system that run many of our body's "behind the scenes" functions.  The fight or flight nervous system, and the calming, rest and digest nervous system.  These two systems have opposite effects on our body organs and systems and can impact our heart rate, blood pressure, breath rate, temperature, GI function, and experience of stress or calm.    Taking time to stimulate the calming, rest and digest part of our nervous system can help reduce experience of symptoms of chronic pain conditions, decrease stress and anxiety, and allow [place] to feel more equipped to handle challenges.  Below are strategies, techniques, and video suggestions to help stimulate this calming system.     Ways to Calm the Nervous System Yoga Meditation Mindfulness  Stretching Exercise Deep, slow breathing into belly (diaphragmatic breathing) with focused prolonged exhale Monotasking vs Multi-tasking (do one activity daily that is simple, focused, and slowly performed) Listen to your biorhythms: sleep when tired, rise when rested, eat when hungry, stop when full, etc Social connections - seek connections with others Laughter - laughing helps stimulate our calming nervous system Massage - by a practitioner or self-massage (example, feet for reflexology points) Singing or humming Cold exposure - try 30 sec of cold water at the end of your shower   Meditation, Yoga, Breathing, Stretching Video Suggestions [employer] YouTube Videos:  Guided Meditation for Pelvic Floor Relaxation - [employer] YouTube video  10-Min Breath Meditation for Pelvic Health and  Healing - [employer] YouTube video  Pelvic Floor Release Stretches  Bedtime Yoga for Pelvic Tension  Pelvic Floor Release and Inner Thigh Stretch - Yoga for Pelvic Health (approx. 40 min) Progressive Muscle Relaxation Exercises - search [employer] exercise videos on YouTube  Focused relaxation through guided relaxation, breathing, and contractions/relaxations of various muscle groups Autogenic Relaxation Technique - search videos on YouTube  Uses body's natural relaxation response through guided meditation, inducing heaviness in body, and verbal stimuli/affirmations to create overall feeling of well-being, slowed breathing, reduced heart rate, reduced blood pressure, reduced stress/anxiety Sympathetic Breathing Meditation - search videos on YouTube  Regulate the nervous system and restore calm through focused breathing to stimulate the parasympathetic nervous system (the opposite of our fight or flight sympathetic nervous system) Mindfulness Meditation - search videos on YouTube  Focuses on choosing to be present in the moment, finding enjoyment in the now Diaphragmatic Breathing - search videos on YouTube Guided Imagery for Relaxation - search videos on YouTube

## 2020-07-01 NOTE — Therapy (Signed)
[facility] [address], [place], [place], [address] Phone: [phone]   Fax:  [phone]  Physical Therapy Treatment  Patient Details  Name: [name] MRN: [id number] Date of Birth: 26-Jul-1946 Referring Provider (PT): Gatha Mayer, MD   Encounter Date: 07/01/2020   PT End of Session - 07/01/20 1022    Visit Number 7    Date for PT Re-Evaluation 07/29/20    Authorization Type UHC Medicare    Progress Note Due on Visit 10    PT Start Time 1020   Pt late   PT Stop Time 1100    PT Time Calculation (min) 40 min    Activity Tolerance Patient tolerated treatment well    Behavior During Therapy [facility] for tasks assessed/performed           Past Medical History:  Diagnosis Date  . Antiphospholipid antibody syndrome ([place]) 05/22/2012   Found subsequent to recurrent PE 9/13  . Arthritis   . Bladder cancer ([place]) dx'd 2009   surg only  . Chronic anal fissure    w/ recurrence's  . Chronic anxiety   . Depression   . ED (erectile dysfunction)   . Esophageal candidiasis ([place]) 03/14/2017  . Fatty liver   . GERD (gastroesophageal reflux disease)   . Heart murmur   . Hiatal hernia   . History of adenomatous polyp of colon    2005  . History of bladder cancer urologist-  dr grapey   papillary TCC  s/p  turbt  . History of DVT (deep vein thrombosis)    post prostate surgery 2010  right LLE  . History of esophageal stricture    w/ dilation  . History of gastric polyp    benign 2010  . History of prostate cancer urologist- dr grapey/  oncology-  dr Alen Blew   Stage  T1c , Gleason 3+3;   s/p  prostatectomy 03-08-210//  per pt currently PSA nondetectable   . History of pulmonary embolus (PE)    post prostate surgery 2010  and 2013 x2 right side per CT scan (found to have lupus anticoaglated )  . History of TIA (transient ischemic attack)    07/ 2012 noted on scans remote probable tia's in  age 55's  . History of  TMJ syndrome   . HTN (hypertension)   . Hyperlipidemia   . Lupus anticoagulant positive    found 2013 when pt had PE  . Moderate aortic regurgitation    with no  stenosis   . OSA on CPAP    per study severe osa 12-14-2010  . Prolapsed internal hemorrhoids, grade 2 03/31/2014  . prostate ca dx'd 2010   surg only  . RLS (restless legs syndrome)   . RLS (restless legs syndrome)   . Sleep apnea   . Symptomatic PVCs cardiologist-  dr berry   intermittant  . Wears glasses     Past Surgical History:  Procedure Laterality Date  . APPENDECTOMY  age 56   and Left Inguinal Hernia Repair  . CARDIOVASCULAR STRESS TEST  06-30-2011   dr berry   normal nuclear study/  normal LV function and wall motion, ef 56%  . COLONOSCOPY  last one 10-19-2011  . CYSTO/ BLADDER BX/  RETROGRADE PYELOGRAM/  TRANSRECTAL PROSTATE BX'S  07-30-2008  . ESOPHAGOGASTRODUODENOSCOPY  last one 06-27-2011  . EVALUATION UNDER ANESTHESIA WITH FISTULECTOMY N/A 01/14/2016   Procedure: EXAM UNDER ANESTHESIA WITH REPAIR OF PERIRECTAL FISTULA  (LIFT);  Surgeon: Michael Boston, MD;  Location: [facility];  Service: General;  Laterality: N/A;  . LAPAROSCOPIC CHOLECYSTECTOMY  05-25-2007   and Excision cyst back of neck  . ROBOT ASSISTED LAPAROSCOPIC RADICAL PROSTATECTOMY  10-20-2008  . STRABISMUS SURGERY Bilateral age 42  . TONSILLECTOMY  age 57  . TRANSTHORACIC ECHOCARDIOGRAM  03-23-2015   dr berry   grade 1 diastolic dysfunction, ef 89-21%/  mild AV thickened leaflets with no stenosis,  moderate AR/  mild ascending aorta ilatation3.9cm/  trivial MR and TR  . UPPER GASTROINTESTINAL ENDOSCOPY      There were no vitals filed for this visit.   Subjective Assessment - 07/01/20 1030    Subjective I had some anxiety last week which made my problems a little worse but I'm better now    Pertinent History prostate surgery 2010, hemorrhoids, DVT/PE on anti-coagulatant, diverticulosis, gall bladder and bladder surgery (cyst -  cancer)    Diagnostic tests procedures: colonoscopy Dec 2020, endoscopy 2021, cystoscopy    Patient Stated Goals ease with BMs and minimize dripping of urine    Currently in Pain? No/denies                             [facility] Adult PT Treatment/Exercise - 07/01/20 0001      Self-Care   Self-Care Other Self-Care Comments    Other Self-Care Comments  stress/anxiety management via vagus nerve stim, handout with ideas/videos      Neuro Re-ed    Neuro Re-ed Details  sidelying contract/relax/bulge training, push my finger out with cue "as though passing gas" better than cue "as though pooping"      Exercises   Exercises Lumbar;Knee/Hip      Lumbar Exercises: Stretches   Single Knee to Chest Stretch 2 reps;10 seconds    Double Knee to Chest Stretch 1 rep;20 seconds    Lower Trunk Rotation 3 reps;10 seconds    Lower Trunk Rotation Limitations barrel breathing each rep    Other Lumbar Stretch Exercise open books x 5 bil with barrel breath, improved ribcage expansion noted    Other Lumbar Stretch Exercise LE windshield wipers x 20      Lumbar Exercises: Aerobic   Recumbent Bike L2 x 5' PT present to discuss progress      Lumbar Exercises: Quadruped   Other Quadruped Lumbar Exercises table top thoracic rot bil x 5, then thread needle 1x15 sec holds bil                    PT Short Term Goals - 05/20/20 1245      PT SHORT TERM GOAL #1   Title Pt will be ind with initial HEP for improved global mobility, flexibilty and coordination of PF.    Status Achieved      PT SHORT TERM GOAL #2   Title Pt will be able to demo proper toileting posture and breathing to help bulge pelvic floor.    Status Achieved      PT SHORT TERM GOAL #3   Title Pt will be able to verbalize at least 3 consistent strategies he is using to implement the beginnings of a healthy bowel routine.    Status New             PT Long Term Goals - 06/16/20 1057      PT LONG TERM GOAL #1    Title Pt will be ind with advanced  HEP and self-care strategies for PF function for toileting.    Status On-going      PT LONG TERM GOAL #2   Title Pt will be able to bulge the PF for improved defecation success.    Status Achieved      PT LONG TERM GOAL #3   Title Pt will report reduced need for OTC aids for BM regularity by at least 50%    Status Achieved      PT LONG TERM GOAL #4   Title Pt will report Type 3 or Type 4 BM at least 2x/week without signif need to strain to defecate.    Status On-going      PT LONG TERM GOAL #5   Title Pt will demo improved trunk and hip mobility to [facility] so he can achieve good toileting posture and optimize gut motility through movement.    Status On-going                 Plan - 07/01/20 1326    Clinical Impression Statement Pt had heightened anxiety last week which contributed to some set backs with defecation ease. He had more difficulty with bulging coordination today.  PT discussed stress/anxiety management options and gave a handout including vagus nerve stimulation activities and pelvic meditation videos from YouTube to try.  Pt is nearing end of cert and will be ready to d/c at that time.    Comorbidities PMH: hemorrhoids, DVT/PE on anti-coagulatant, diverticulosis PSH: prostatectomy, repair of perirectal fistual    PT Frequency 1x / week    PT Duration 12 weeks    PT Treatment/Interventions ADLs/Self Care Home Management;Biofeedback;Electrical Stimulation;Moist Heat;Neuromuscular re-education;Balance training;Functional mobility training;Therapeutic activities;Therapeutic exercise;Manual techniques;Patient/family education;Dry needling;Passive range of motion;Joint Manipulations;Spinal Manipulations    PT Next Visit Plan f/u on stress management ideas, continue neuro re-ed for defecation, sink frog stretch f/u, open books, lower trunk rotation    PT Home Exercise Plan Access Code: [email or web address], abd massage, visualization for PF drop/release, 5  in/5 out breathing into pelvic bowl, palpate anus on toilet to see if bulging    Consulted and Agree with Plan of Care Patient           Patient will benefit from skilled therapeutic intervention in order to improve the following deficits and impairments:     Visit Diagnosis: Other lack of coordination  Cramp and spasm     Problem List Patient Active Problem List   Diagnosis Date Noted  . Thoracic aortic aneurysm ([place]) 04/05/2016  . Aortic insufficiency 04/05/2016  . Prolapsed internal hemorrhoids, grade 2 03/31/2014  . Upper abdominal pain - chronic 03/31/2014  . Dysphagia 03/31/2014  . Antiphospholipid antibody syndrome ([place]) 05/22/2012  . Pulmonary embolus ([place]) 11/11/2011  . DVT, lower extremity, distal 11/11/2011  . Family history of malignant neoplasm of gastrointestinal tract 10/18/2011  . GERD (gastroesophageal reflux disease) 06/23/2011  . IBS (irritable bowel syndrome) 06/23/2011  . Premature ventricular contractions 06/22/2011  . Malignant neoplasm of bladder ([place]) 12/12/2008  . PROSTATE CANCER, HX OF 12/12/2008  . PULMONARY EMBOLISM, HX OF 12/12/2008  . HYPERLIPIDEMIA 01/05/2008  . ANXIETY DEPRESSION 01/05/2008  . Essential hypertension 01/05/2008  . Hypertensive heart disease without heart failure 01/05/2008  . CHOLELITHIASIS, ASYMPTOMATIC 01/05/2008  . SLEEP APNEA 01/05/2008  . SYSTOLIC MURMUR 93/23/5573  . Hx of adenomatous colonic polyps 10/14/2003    Baruch Merl, PT 07/01/20 1:28 PM   [facility] [address], [place], [place], [address]  Phone: [phone]   Fax:  [phone]  Name: ASTER ECKRICH MRN: [id number] Date of Birth: 1946-04-19

## 2020-07-08 ENCOUNTER — Ambulatory Visit: Payer: Medicare Other | Admitting: Physical Therapy

## 2020-07-13 ENCOUNTER — Other Ambulatory Visit: Payer: Self-pay

## 2020-07-13 ENCOUNTER — Ambulatory Visit (INDEPENDENT_AMBULATORY_CARE_PROVIDER_SITE_OTHER): Payer: Medicare Other | Admitting: Otolaryngology

## 2020-07-13 VITALS — Temp 96.4°F

## 2020-07-13 DIAGNOSIS — B370 Candidal stomatitis: Secondary | ICD-10-CM

## 2020-07-13 NOTE — Progress Notes (Signed)
HPI: [name] is a 74 y.o. male who returns today for evaluation of floor mouth leukoplakia that was noted on previous evaluation little over a month ago. He has been looking at this himself and feels like it is doing much better. He is having no symptoms of sore throat or difficulty eating. He is on a blood thinner as well as uses Flonase but has not been on steroids or antibiotics recently. He has had a previous history of thrush..  Past Medical History:  Diagnosis Date  . Antiphospholipid antibody syndrome ([place]) 05/22/2012   Found subsequent to recurrent PE 9/13  . Arthritis   . Bladder cancer ([place]) dx'd 2009   surg only  . Chronic anal fissure    w/ recurrence's  . Chronic anxiety   . Depression   . ED (erectile dysfunction)   . Esophageal candidiasis ([place]) 03/14/2017  . Fatty liver   . GERD (gastroesophageal reflux disease)   . Heart murmur   . Hiatal hernia   . History of adenomatous polyp of colon    2005  . History of bladder cancer urologist-  dr grapey   papillary TCC  s/p  turbt  . History of DVT (deep vein thrombosis)    post prostate surgery 2010  right LLE  . History of esophageal stricture    w/ dilation  . History of gastric polyp    benign 2010  . History of prostate cancer urologist- dr grapey/  oncology-  dr Alen Blew   Stage  T1c , Gleason 3+3;   s/p  prostatectomy 03-08-210//  per pt currently PSA nondetectable   . History of pulmonary embolus (PE)    post prostate surgery 2010  and 2013 x2 right side per CT scan (found to have lupus anticoaglated )  . History of TIA (transient ischemic attack)    07/ 2012 noted on scans remote probable tia's in  age 79's  . History of TMJ syndrome   . HTN (hypertension)   . Hyperlipidemia   . Lupus anticoagulant positive    found 2013 when pt had PE  . Moderate aortic regurgitation    with no  stenosis   . OSA on CPAP    per study severe osa 12-14-2010  . Prolapsed internal hemorrhoids, grade 2 03/31/2014  .  prostate ca dx'd 2010   surg only  . RLS (restless legs syndrome)   . RLS (restless legs syndrome)   . Sleep apnea   . Symptomatic PVCs cardiologist-  dr berry   intermittant  . Wears glasses    Past Surgical History:  Procedure Laterality Date  . APPENDECTOMY  age 69   and Left Inguinal Hernia Repair  . CARDIOVASCULAR STRESS TEST  06-30-2011   dr berry   normal nuclear study/  normal LV function and wall motion, ef 56%  . COLONOSCOPY  last one 10-19-2011  . CYSTO/ BLADDER BX/  RETROGRADE PYELOGRAM/  TRANSRECTAL PROSTATE BX'S  07-30-2008  . ESOPHAGOGASTRODUODENOSCOPY  last one 06-27-2011  . EVALUATION UNDER ANESTHESIA WITH FISTULECTOMY N/A 01/14/2016   Procedure: EXAM UNDER ANESTHESIA WITH REPAIR OF PERIRECTAL FISTULA  (LIFT);  Surgeon: Michael Boston, MD;  Location: [facility];  Service: General;  Laterality: N/A;  . LAPAROSCOPIC CHOLECYSTECTOMY  05-25-2007   and Excision cyst back of neck  . ROBOT ASSISTED LAPAROSCOPIC RADICAL PROSTATECTOMY  10-20-2008  . STRABISMUS SURGERY Bilateral age 11  . TONSILLECTOMY  age 62  . TRANSTHORACIC ECHOCARDIOGRAM  03-23-2015   dr berry  grade 1 diastolic dysfunction, ef 18-56%/  mild AV thickened leaflets with no stenosis,  moderate AR/  mild ascending aorta ilatation3.9cm/  trivial MR and TR  . UPPER GASTROINTESTINAL ENDOSCOPY     Social History   Socioeconomic History  . Marital status: Married    Spouse name: Not on file  . Number of children: 2  . Years of education: Not on file  . Highest education level: Not on file  Occupational History  . Occupation: Art gallery manager  . Occupation: FIELD APPLICATIONS    Employer: [employer]  Tobacco Use  . Smoking status: Current Some Day Smoker    Years: 51.00    Types: Cigars  . Smokeless tobacco: Never Used  . Tobacco comment: 5 cigars /week--/  smoked cigarettes for 10 years quit 1990's  Vaping Use  . Vaping Use: Never used  Substance and Sexual Activity  . Alcohol use:  Yes    Alcohol/week: 28.0 standard drinks    Types: 14 Glasses of wine, 14 Cans of beer per week    Comment: 2 beers a day  . Drug use: No  . Sexual activity: Not on file  Other Topics Concern  . Not on file  Social History Narrative   Married to [place] Charlann Lange)   Gets regular exercise.   Daily caffeine- 2 cups daily.   Education: MS Estate manager/land agent   2 kids + grandchildren   Enjoys on track car driving (e.g, [employer])      Social Determinants of Health   Financial Resource Strain:   . Difficulty of Paying Living Expenses: Not on file  Food Insecurity:   . Worried About Charity fundraiser in the Last Year: Not on file  . Ran Out of Food in the Last Year: Not on file  Transportation Needs:   . Lack of Transportation (Medical): Not on file  . Lack of Transportation (Non-Medical): Not on file  Physical Activity:   . Days of Exercise per Week: Not on file  . Minutes of Exercise per Session: Not on file  Stress:   . Feeling of Stress : Not on file  Social Connections:   . Frequency of Communication with Friends and Family: Not on file  . Frequency of Social Gatherings with Friends and Family: Not on file  . Attends Religious Services: Not on file  . Active Member of Clubs or Organizations: Not on file  . Attends Archivist Meetings: Not on file  . Marital Status: Not on file   Family History  Problem Relation Age of Onset  . Hypertension Mother   . Hypertension Father   . Stroke Father   . Colon polyps Father   . Colon cancer Father        questionable  . Diabetes Maternal Grandmother   . Heart disease Maternal Grandmother   . Colon polyps Brother   . Crohn's disease Brother   . Cirrhosis Other        [employer]  . Esophageal cancer Neg Hx   . Rectal cancer Neg Hx   . Stomach cancer Neg Hx    Allergies  Allergen Reactions  . Aspirin Other (See Comments)    REACTION: gi upset with higher dose  . Penicillins Itching   Prior to Admission  medications   Medication Sig Start Date End Date Taking? Authorizing Provider  ALPRAZolam Duanne Moron) 1 MG tablet Take 1 mg by mouth at bedtime as needed for anxiety.   Yes [provider]  Cholecalciferol (VITAMIN D3) 50 MCG (2000 UT) capsule  08/01/14  Yes [provider]  co-enzyme Q-10 30 MG capsule Take 30 mg by mouth every evening.    Yes [provider]  CRESTOR 5 MG tablet TAKE 1 TABLET BY MOUTH EVERY DAY Patient taking differently: TAKE 1 TABLET BY MOUTH EVERY DAY-- takes in am 02/23/15  Yes [name], MD  fish oil-omega-3 fatty acids 1000 MG capsule Take 1 g by mouth daily.    Yes [provider]  fluticasone (FLONASE) 50 MCG/ACT nasal spray USE 2 SPRAYS IN EACH NOSTRIL EVERY NIGHT 06/09/20  Yes Rozetta Nunnery, MD  hydrochlorothiazide (HYDRODIURIL) 25 MG tablet Take 25 mg by mouth daily.   Yes [provider]  hydrocortisone (ANUSOL-HC) 2.5 % rectal cream PLACE RECTALLY AT BEDTIME AS NEEDED FOR HEMORRHOIDS. 05/11/20  Yes Gatha Mayer, MD  Hypertonic Nasal Wash (SINUS RINSE BOTTLE KIT) PACK Place into the nose at bedtime.     Yes [provider]  methylcellulose oral powder Take 1 packet by mouth 2 (two) times daily.   Yes [provider]  metoprolol tartrate (LOPRESSOR) 100 MG tablet TAKE 1 TABLET BY MOUTH TWICE A DAY 05/11/20  Yes [name], MD  Multiple Vitamin (MULTIVITAMIN) tablet Take 1 tablet by mouth daily.     Yes [provider]  oxymetazoline (AFRIN) 0.05 % nasal spray Place 2 sprays into the nose 2 (two) times daily.     Yes [provider]  pantoprazole (PROTONIX) 40 MG tablet TAKE 1 TABLET BY MOUTH EVERY DAY 05/11/20  Yes Gatha Mayer, MD  ramipril (ALTACE) 10 MG capsule TAKE 1 CAPSULE (10 MG TOTAL) BY MOUTH 2 (TWO) TIMES DAILY 03/19/20  Yes [name], MD  Rivaroxaban (XARELTO) 20 MG TABS Take 20 mg by mouth daily.   Yes [provider]  sertraline (ZOLOFT)  100 MG tablet Take 100 mg by mouth daily.   Yes [provider]  nystatin (MYCOSTATIN) 100000 UNIT/ML suspension Take by mouth. 01/16/20   [provider]     Positive ROS: Otherwise negative  All other systems have been reviewed and were otherwise negative with the exception of those mentioned in the HPI and as above.  Physical Exam: Constitutional: Alert, well-appearing, no acute distress Ears: External ears without lesions or tenderness. Ear canals are clear bilaterally with intact, clear TMs.  Nasal: External nose without lesions. Septum mild deformity with mild rhinitis. Clear nasal passages otherwise. Oral: Lips and gums without lesions. Tongue and palate mucosa without lesions. Floor of mouth is clear to examination with no leukoplakia no ulcers. Normal-appearing mucosa. Of note he does have some mucosal white debris consistent with thrush along the left tonsil and around the edge of the uvula. This is otherwise asymptomatic. I did point this out to him in the office today Neck: No palpable adenopathy or masses Respiratory: Breathing comfortably  Skin: No facial/neck lesions or rash noted.  Procedures  Assessment: Resolved leukoplakia of the floor mouth. Probable asymptomatic Candida involving the left tonsil and uvula.  Plan: Recommended use of Listerine or scope mouth rinse once or twice a day for the Candida. Would not treat this unless it becomes symptomatic. He apparently has had thrush several times in the past. He does use Flonase regularly and recommended on use of the Flonase to lean forward so the excess Flonase does not drip down his throat. He also uses CPAP at night.   Radene Journey, MD

## 2020-07-15 ENCOUNTER — Ambulatory Visit: Payer: Medicare Other | Attending: Internal Medicine | Admitting: Physical Therapy

## 2020-07-15 ENCOUNTER — Other Ambulatory Visit: Payer: Self-pay

## 2020-07-15 ENCOUNTER — Encounter: Payer: Self-pay | Admitting: Physical Therapy

## 2020-07-15 DIAGNOSIS — R252 Cramp and spasm: Secondary | ICD-10-CM | POA: Diagnosis not present

## 2020-07-15 DIAGNOSIS — R278 Other lack of coordination: Secondary | ICD-10-CM | POA: Diagnosis not present

## 2020-07-15 NOTE — Therapy (Signed)
[facility] [address], [place], [place], [address] Phone: [phone]   Fax:  [phone]  Physical Therapy Treatment  Patient Details  Name: [name] MRN: [id number] Date of Birth: 1946/07/02 Referring Provider (PT): Gatha Mayer, MD   Encounter Date: 07/15/2020   PT End of Session - 07/15/20 1025    Visit Number 8    Date for PT Re-Evaluation 07/29/20    Authorization Type UHC Medicare    Progress Note Due on Visit 10    PT Start Time 1018    PT Stop Time 1056    PT Time Calculation (min) 38 min    Activity Tolerance Patient tolerated treatment well    Behavior During Therapy [facility] for tasks assessed/performed           Past Medical History:  Diagnosis Date   Antiphospholipid antibody syndrome ([place]) 05/22/2012   Found subsequent to recurrent PE 9/13   Arthritis    Bladder cancer ([place]) dx'd 2009   surg only   Chronic anal fissure    w/ recurrence's   Chronic anxiety    Depression    ED (erectile dysfunction)    Esophageal candidiasis ([place]) 03/14/2017   Fatty liver    GERD (gastroesophageal reflux disease)    Heart murmur    Hiatal hernia    History of adenomatous polyp of colon    2005   History of bladder cancer urologist-  dr Risa Grill   papillary TCC  s/p  turbt   History of DVT (deep vein thrombosis)    post prostate surgery 2010  right LLE   History of esophageal stricture    w/ dilation   History of gastric polyp    benign 2010   History of prostate cancer urologist- dr grapey/  oncology-  dr Alen Blew   Stage  T1c , Gleason 3+3;   s/p  prostatectomy 03-08-210//  per pt currently PSA nondetectable    History of pulmonary embolus (PE)    post prostate surgery 2010  and 2013 x2 right side per CT scan (found to have lupus anticoaglated )   History of TIA (transient ischemic attack)    07/ 2012 noted on scans remote probable tia's in  age 74's   History of TMJ  syndrome    HTN (hypertension)    Hyperlipidemia    Lupus anticoagulant positive    found 2013 when pt had PE   Moderate aortic regurgitation    with no  stenosis    OSA on CPAP    per study severe osa 12-14-2010   Prolapsed internal hemorrhoids, grade 2 03/31/2014   prostate ca dx'd 2010   surg only   RLS (restless legs syndrome)    RLS (restless legs syndrome)    Sleep apnea    Symptomatic PVCs cardiologist-  dr berry   intermittant   Wears glasses     Past Surgical History:  Procedure Laterality Date   APPENDECTOMY  age 74   and Left Inguinal Hernia Repair   CARDIOVASCULAR STRESS TEST  06-30-2011   dr berry   normal nuclear study/  normal LV function and wall motion, ef 56%   COLONOSCOPY  last one 10-19-2011   CYSTO/ BLADDER BX/  RETROGRADE PYELOGRAM/  TRANSRECTAL PROSTATE BX'S  07-30-2008   ESOPHAGOGASTRODUODENOSCOPY  last one 06-27-2011   EVALUATION UNDER ANESTHESIA WITH FISTULECTOMY N/A 01/14/2016   Procedure: EXAM UNDER ANESTHESIA WITH REPAIR OF PERIRECTAL FISTULA  (LIFT);  Surgeon: Remo Lipps  Gross, MD;  Location: [facility];  Service: General;  Laterality: N/A;   LAPAROSCOPIC CHOLECYSTECTOMY  05-25-2007   and Excision cyst back of neck   ROBOT ASSISTED LAPAROSCOPIC RADICAL PROSTATECTOMY  10-20-2008   STRABISMUS SURGERY Bilateral age 8   TONSILLECTOMY  age 74   TRANSTHORACIC ECHOCARDIOGRAM  03-23-2015   dr berry   grade 1 diastolic dysfunction, ef 00-93%/  mild AV thickened leaflets with no stenosis,  moderate AR/  mild ascending aorta ilatation3.9cm/  trivial MR and TR   UPPER GASTROINTESTINAL ENDOSCOPY      There were no vitals filed for this visit.                      [place] Adult PT Treatment/Exercise - 07/15/20 0001      Neuro Re-ed    Neuro Re-ed Details  ribcage expansion breathing in open book TCs by PT, sidelying contract/relax/bulge training, push my finger out       Exercises   Exercises  Lumbar;Knee/Hip      Lumbar Exercises: Stretches   Quadruped Mid Back Stretch 3 reps;10 seconds    Piriformis Stretch Left;Right;1 rep;30 seconds    Other Lumbar Stretch Exercise deep squat stretch holding inside of sink, 3x10    Other Lumbar Stretch Exercise LE windshield wipers      Lumbar Exercises: Sidelying   Other Sidelying Lumbar Exercises open books x 5 each side      Lumbar Exercises: Quadruped   Other Quadruped Lumbar Exercises quad SB, then thread needle, 2x10                    PT Short Term Goals - 05/20/20 1245      PT SHORT TERM GOAL #1   Title Pt will be ind with initial HEP for improved global mobility, flexibilty and coordination of PF.    Status Achieved      PT SHORT TERM GOAL #2   Title Pt will be able to demo proper toileting posture and breathing to help bulge pelvic floor.    Status Achieved      PT SHORT TERM GOAL #3   Title Pt will be able to verbalize at least 3 consistent strategies he is using to implement the beginnings of a healthy bowel routine.    Status New             PT Long Term Goals - 07/15/20 1026      PT LONG TERM GOAL #1   Title Pt will be ind with advanced HEP and self-care strategies for PF function for toileting.    Status Achieved      PT LONG TERM GOAL #2   Title Pt will be able to bulge the PF for improved defecation success.    Status Achieved      PT LONG TERM GOAL #3   Title Pt will report reduced need for OTC aids for BM regularity by at least 50%    Baseline Pt continues to use Miralax and Citricel    Status Not Met      PT LONG TERM GOAL #4   Title Pt will report Type 3 or Type 4 BM at least 2x/week without signif need to strain to defecate.    Status Achieved      PT LONG TERM GOAL #5   Title Pt will demo improved trunk and hip mobility to [facility] so he can achieve good toileting posture and optimize gut motility  through movement.    Status Achieved                 Plan - 07/15/20 1054     Clinical Impression Statement Pt continues to benefit from trunk and hip stretches.  He has met all LTGs with exception of ongoing use of OTC aides for BMs, which he has used routinely for years.  Pt is able to perform diaphragmatic breathing and good PF bulge for improved toileting technqiques and defecation ease.  He is very pleased with his progress and has tools for continued success.  D/C to HEP and self-care strategies with return as needed.    Comorbidities PMH: hemorrhoids, DVT/PE on anti-coagulatant, diverticulosis PSH: prostatectomy, repair of perirectal fistual    PT Frequency 1x / week    PT Duration 12 weeks    PT Treatment/Interventions ADLs/Self Care Home Management;Biofeedback;Electrical Stimulation;Moist Heat;Neuromuscular re-education;Balance training;Functional mobility training;Therapeutic activities;Therapeutic exercise;Manual techniques;Patient/family education;Dry needling;Passive range of motion;Joint Manipulations;Spinal Manipulations    PT Next Visit Plan d/c to HEP    PT Home Exercise Plan Access Code: [email or web address], abd massage, visualization for PF drop/release, 5 in/5 out breathing into pelvic bowl, palpate anus on toilet to see if bulging    Consulted and Agree with Plan of Care Patient           Patient will benefit from skilled therapeutic intervention in order to improve the following deficits and impairments:     Visit Diagnosis: Other lack of coordination  Cramp and spasm     Problem List Patient Active Problem List   Diagnosis Date Noted   Thoracic aortic aneurysm ([place]) 04/05/2016   Aortic insufficiency 04/05/2016   Prolapsed internal hemorrhoids, grade 2 03/31/2014   Upper abdominal pain - chronic 03/31/2014   Dysphagia 03/31/2014   Antiphospholipid antibody syndrome ([place]) 05/22/2012   Pulmonary embolus ([place]) 11/11/2011   DVT, lower extremity, distal 11/11/2011   Family history of malignant neoplasm of gastrointestinal tract 10/18/2011    GERD (gastroesophageal reflux disease) 06/23/2011   IBS (irritable bowel syndrome) 06/23/2011   Premature ventricular contractions 06/22/2011   Malignant neoplasm of bladder ([place]) 12/12/2008   PROSTATE CANCER, HX OF 12/12/2008   PULMONARY EMBOLISM, HX OF 12/12/2008   HYPERLIPIDEMIA 01/05/2008   ANXIETY DEPRESSION 01/05/2008   Essential hypertension 01/05/2008   Hypertensive heart disease without heart failure 01/05/2008   CHOLELITHIASIS, ASYMPTOMATIC 01/05/2008   SLEEP APNEA 62/70/3500   SYSTOLIC MURMUR 93/81/8299   Hx of adenomatous colonic polyps 10/14/2003    Sevyn Paredez E Jonanthan Bolender 07/15/2020, 10:58 AM   PHYSICAL THERAPY DISCHARGE SUMMARY  Visits from Start of Care: 8  Current functional level related to goals / functional outcomes: See above   Remaining deficits: See above   Education / Equipment: HEP and self-care strategies Plan: Patient agrees to discharge.  Patient goals were met. Patient is being discharged due to meeting the stated rehab goals.  ?????         Baruch Merl, PT 07/15/20 10:58 AM   [facility] [address] [place], [place], [address] Phone: [phone]   Fax:  [phone]  Name: KIREE DEJARNETTE MRN: [id number] Date of Birth: 04-Apr-1946

## 2020-07-18 ENCOUNTER — Other Ambulatory Visit: Payer: Self-pay | Admitting: Internal Medicine

## 2020-07-24 ENCOUNTER — Inpatient Hospital Stay: Payer: Medicare Other | Attending: Oncology

## 2020-07-24 ENCOUNTER — Other Ambulatory Visit: Payer: Self-pay

## 2020-07-24 DIAGNOSIS — Z8546 Personal history of malignant neoplasm of prostate: Secondary | ICD-10-CM | POA: Insufficient documentation

## 2020-07-24 DIAGNOSIS — Z79899 Other long term (current) drug therapy: Secondary | ICD-10-CM | POA: Insufficient documentation

## 2020-07-24 DIAGNOSIS — Z9079 Acquired absence of other genital organ(s): Secondary | ICD-10-CM | POA: Insufficient documentation

## 2020-07-24 DIAGNOSIS — Z86718 Personal history of other venous thrombosis and embolism: Secondary | ICD-10-CM | POA: Insufficient documentation

## 2020-07-24 DIAGNOSIS — Z7901 Long term (current) use of anticoagulants: Secondary | ICD-10-CM | POA: Diagnosis not present

## 2020-07-24 LAB — CBC WITH DIFFERENTIAL (CANCER CENTER ONLY)
Abs Immature Granulocytes: 0.01 10*3/uL (ref 0.00–0.07)
Basophils Absolute: 0 10*3/uL (ref 0.0–0.1)
Basophils Relative: 0 %
Eosinophils Absolute: 0.1 10*3/uL (ref 0.0–0.5)
Eosinophils Relative: 2 %
HCT: 40.6 % (ref 39.0–52.0)
Hemoglobin: 14.1 g/dL (ref 13.0–17.0)
Immature Granulocytes: 0 %
Lymphocytes Relative: 35 %
Lymphs Abs: 1.6 10*3/uL (ref 0.7–4.0)
MCH: 31.1 pg (ref 26.0–34.0)
MCHC: 34.7 g/dL (ref 30.0–36.0)
MCV: 89.4 fL (ref 80.0–100.0)
Monocytes Absolute: 0.4 10*3/uL (ref 0.1–1.0)
Monocytes Relative: 9 %
Neutro Abs: 2.5 10*3/uL (ref 1.7–7.7)
Neutrophils Relative %: 54 %
Platelet Count: 152 10*3/uL (ref 150–400)
RBC: 4.54 MIL/uL (ref 4.22–5.81)
RDW: 13.5 % (ref 11.5–15.5)
WBC Count: 4.7 10*3/uL (ref 4.0–10.5)
nRBC: 0 % (ref 0.0–0.2)

## 2020-07-24 LAB — CMP (CANCER CENTER ONLY)
ALT: 40 U/L (ref 0–44)
AST: 28 U/L (ref 15–41)
Albumin: 4 g/dL (ref 3.5–5.0)
Alkaline Phosphatase: 58 U/L (ref 38–126)
Anion gap: 7 (ref 5–15)
BUN: 13 mg/dL (ref 8–23)
CO2: 33 mmol/L — ABNORMAL HIGH (ref 22–32)
Calcium: 9.4 mg/dL (ref 8.9–10.3)
Chloride: 101 mmol/L (ref 98–111)
Creatinine: 1.07 mg/dL (ref 0.61–1.24)
GFR, Estimated: >60 mL/min (ref >60)
Glucose, Bld: 88 mg/dL (ref 70–99)
Potassium: 3.6 mmol/L (ref 3.5–5.1)
Sodium: 141 mmol/L (ref 135–145)
Total Bilirubin: 0.9 mg/dL (ref 0.3–1.2)
Total Protein: 6.7 g/dL (ref 6.5–8.1)

## 2020-07-25 LAB — PROSTATE-SPECIFIC AG, SERUM (LABCORP): Prostate Specific Ag, Serum: <0.1 ng/mL (ref 0.0–4.0)

## 2020-07-31 ENCOUNTER — Inpatient Hospital Stay (HOSPITAL_BASED_OUTPATIENT_CLINIC_OR_DEPARTMENT_OTHER): Payer: Medicare Other | Admitting: Oncology

## 2020-07-31 ENCOUNTER — Other Ambulatory Visit: Payer: Self-pay

## 2020-07-31 ENCOUNTER — Telehealth: Payer: Self-pay | Admitting: Oncology

## 2020-07-31 VITALS — BP 138/74 | HR 63 | Temp 97.8°F | Resp 18 | Ht 71.0 in | Wt 215.8 lb

## 2020-07-31 DIAGNOSIS — Z86718 Personal history of other venous thrombosis and embolism: Secondary | ICD-10-CM | POA: Diagnosis not present

## 2020-07-31 DIAGNOSIS — Z8546 Personal history of malignant neoplasm of prostate: Secondary | ICD-10-CM | POA: Diagnosis not present

## 2020-07-31 DIAGNOSIS — Z7901 Long term (current) use of anticoagulants: Secondary | ICD-10-CM | POA: Diagnosis not present

## 2020-07-31 DIAGNOSIS — Z79899 Other long term (current) drug therapy: Secondary | ICD-10-CM | POA: Diagnosis not present

## 2020-07-31 NOTE — Telephone Encounter (Signed)
Scheduled appointments per 12/17 los. Spoke to patient who is aware of appointments dates and times.

## 2020-07-31 NOTE — Progress Notes (Signed)
Hematology and Oncology Follow Up Visit  [name] [id number] 01/08/1946 74 y.o. 07/31/2020 8:11 AM [name], MDPharr, Andrew Jew, MD   Principle Diagnosis: 74 year old man with prostate cancer diagnosed in 2010.  He was found to have Gleason score 3+3 = 6 and localized disease and currently in remission.  Prior Therapy: Status post prostatectomy done on 10/20/2008.  Current therapy: Active surveillance.   Interim History: [name] returns today for repeat evaluation.  Since the last visit, he reports no major changes in his health.  He continues to be active and attends to activities of daily living.  He denies any urinary complaints burning hematuria, dysuria or frequency.  He denies any bleeding complications associated with Xarelto.     Medications: Unchanged on review. Current Outpatient Medications  Medication Sig Dispense Refill  . ALPRAZolam (XANAX) 1 MG tablet Take 1 mg by mouth at bedtime as needed for anxiety.    . Cholecalciferol (VITAMIN D3) 50 MCG (2000 UT) capsule     . co-enzyme Q-10 30 MG capsule Take 30 mg by mouth every evening.     Marland Kitchen CRESTOR 5 MG tablet TAKE 1 TABLET BY MOUTH EVERY DAY (Patient taking differently: TAKE 1 TABLET BY MOUTH EVERY DAY-- takes in am) 30 tablet 8  . fish oil-omega-3 fatty acids 1000 MG capsule Take 1 g by mouth daily.     . fluticasone (FLONASE) 50 MCG/ACT nasal spray USE 2 SPRAYS IN EACH NOSTRIL EVERY NIGHT 48 mL 6  . hydrochlorothiazide (HYDRODIURIL) 25 MG tablet Take 25 mg by mouth daily.    . hydrocortisone (ANUSOL-HC) 2.5 % rectal cream PLACE RECTALLY AT BEDTIME AS NEEDED FOR HEMORRHOIDS. 30 g 1  . Hypertonic Nasal Wash (SINUS RINSE BOTTLE KIT) PACK Place into the nose at bedtime.      . methylcellulose oral powder Take 1 packet by mouth 2 (two) times daily.    . metoprolol tartrate (LOPRESSOR) 100 MG tablet TAKE 1 TABLET BY MOUTH TWICE A DAY 180 tablet 3  . Multiple Vitamin (MULTIVITAMIN) tablet Take 1 tablet by mouth  daily.      Marland Kitchen nystatin (MYCOSTATIN) 100000 UNIT/ML suspension Take by mouth.    Marland Kitchen oxymetazoline (AFRIN) 0.05 % nasal spray Place 2 sprays into the nose 2 (two) times daily.      . pantoprazole (PROTONIX) 40 MG tablet TAKE 1 TABLET BY MOUTH EVERY DAY 90 tablet 3  . ramipril (ALTACE) 10 MG capsule TAKE 1 CAPSULE (10 MG TOTAL) BY MOUTH 2 (TWO) TIMES DAILY 180 capsule 3  . Rivaroxaban (XARELTO) 20 MG TABS Take 20 mg by mouth daily.    . sertraline (ZOLOFT) 100 MG tablet Take 100 mg by mouth daily.     No current facility-administered medications for this visit.     Allergies:  Allergies  Allergen Reactions  . Aspirin Other (See Comments)    REACTION: gi upset with higher dose  . Penicillins Itching      Physical Exam: Blood pressure 138/74, pulse 63, temperature 97.8 F (36.6 C), temperature source Tympanic, resp. rate 18, height _0  (1.803 m), weight 215 lb 12.8 oz (97.9 kg), SpO2 97 %.    ECOG: 0   General appearance: Comfortable appearing without any discomfort Head: Normocephalic without any trauma Oropharynx: Mucous membranes are moist and pink without any thrush or ulcers. Eyes: Pupils are equal and round reactive to light. Lymph nodes: No cervical, supraclavicular, inguinal or axillary lymphadenopathy.   Heart:regular rate and rhythm.  S1 and S2 without  leg edema. Lung: Clear without any rhonchi or wheezes.  No dullness to percussion. Abdomin: Soft, nontender, nondistended with good bowel sounds.  No hepatosplenomegaly. Musculoskeletal: No joint deformity or effusion.  Full range of motion noted. Neurological: No deficits noted on motor, sensory and deep tendon reflex exam. Skin: No petechial rash or dryness.  Appeared moist.     Lab Results: Lab Results  Component Value Date   WBC 4.7 07/24/2020   HGB 14.1 07/24/2020   HCT 40.6 07/24/2020   MCV 89.4 07/24/2020   PLT 152 07/24/2020     Chemistry      Component Value Date/Time   NA 141 07/24/2020 0838    NA 142 04/18/2018 1324   NA 140 07/14/2017 0817   K 3.6 07/24/2020 0838   K 3.8 07/14/2017 0817   CL 101 07/24/2020 0838   CL 103 11/02/2012 0737   CO2 33 (H) 07/24/2020 0838   CO2 28 07/14/2017 0817   BUN 13 07/24/2020 0838   BUN 12 04/18/2018 1324   BUN 16.8 07/14/2017 0817   CREATININE 1.07 07/24/2020 0838   CREATININE 1.1 07/14/2017 0817      Component Value Date/Time   CALCIUM 9.4 07/24/2020 0838   CALCIUM 9.4 07/14/2017 0817   ALKPHOS 58 07/24/2020 0838   ALKPHOS 67 07/14/2017 0817   AST 28 07/24/2020 0838   AST 35 (H) 07/14/2017 0817   ALT 40 07/24/2020 0838   ALT 57 (H) 07/14/2017 0817   BILITOT 0.9 07/24/2020 0838   BILITOT 0.60 07/14/2017 0817      Results for Andrew, Ford (MRN [id number]) as of 07/31/2020 08:14  Ref. Range 07/26/2019 08:18 07/24/2020 08:38  Prostate Specific Ag, Serum Latest Ref Range: 0.0 - 4.0 ng/mL <0.1 <0.1     Impression and Plan:   73 year old man with:  1. Prostate cancer presented with Gleason score 6 and a localized disease diagnosed in 2010.  He continues to be in remission after prostatectomy.  The natural course of his disease was reviewed and risk of relapse was assessed.  He is over 10 years out with PSA remains undetectable.  He underwent CT scan of the chest on October 2021 and did not show any evidence of malignancy.  Risks and benefits of continued annual surveillance were discussed at this time he is agreeable to continue with annual visits.  2.  Superficial bladder tumor: No evidence of recurrent disease at this time.   3.  Age-appropriate cancer screening: He is currently up-to-date without any signs symptoms of other malignancy.  4.  Deep vein thrombosis: He had recurrent thrombosis and chronically anticoagulated with Xarelto.  Risks and benefits of continuing this treatment long-term were discussed.  He is agreeable and willing to continue.  5. Follow-up: In 1 year for a follow-up visit.  30  minutes were  dedicated to this encounter.  The time was spent on reviewing laboratory data, imaging studies, disease status update and future outlook plan of care.      Andrew Button, MD 12/17/20218:11 AM

## 2020-09-01 ENCOUNTER — Other Ambulatory Visit: Payer: Self-pay | Admitting: Internal Medicine

## 2020-09-11 DIAGNOSIS — G4733 Obstructive sleep apnea (adult) (pediatric): Secondary | ICD-10-CM | POA: Diagnosis not present

## 2020-09-16 DIAGNOSIS — Z20822 Contact with and (suspected) exposure to covid-19: Secondary | ICD-10-CM | POA: Diagnosis not present

## 2020-09-21 DIAGNOSIS — L570 Actinic keratosis: Secondary | ICD-10-CM | POA: Diagnosis not present

## 2020-09-21 DIAGNOSIS — D225 Melanocytic nevi of trunk: Secondary | ICD-10-CM | POA: Diagnosis not present

## 2020-09-21 DIAGNOSIS — D485 Neoplasm of uncertain behavior of skin: Secondary | ICD-10-CM | POA: Diagnosis not present

## 2020-09-21 DIAGNOSIS — D1801 Hemangioma of skin and subcutaneous tissue: Secondary | ICD-10-CM | POA: Diagnosis not present

## 2020-09-21 DIAGNOSIS — L309 Dermatitis, unspecified: Secondary | ICD-10-CM | POA: Diagnosis not present

## 2020-09-21 DIAGNOSIS — L821 Other seborrheic keratosis: Secondary | ICD-10-CM | POA: Diagnosis not present

## 2020-09-21 DIAGNOSIS — L820 Inflamed seborrheic keratosis: Secondary | ICD-10-CM | POA: Diagnosis not present

## 2020-09-21 DIAGNOSIS — L814 Other melanin hyperpigmentation: Secondary | ICD-10-CM | POA: Diagnosis not present

## 2020-09-21 DIAGNOSIS — L603 Nail dystrophy: Secondary | ICD-10-CM | POA: Diagnosis not present

## 2020-10-12 ENCOUNTER — Other Ambulatory Visit: Payer: Self-pay | Admitting: Internal Medicine

## 2020-10-26 DIAGNOSIS — E7800 Pure hypercholesterolemia, unspecified: Secondary | ICD-10-CM | POA: Diagnosis not present

## 2020-10-26 DIAGNOSIS — I10 Essential (primary) hypertension: Secondary | ICD-10-CM | POA: Diagnosis not present

## 2020-10-29 DIAGNOSIS — I700 Atherosclerosis of aorta: Secondary | ICD-10-CM | POA: Diagnosis not present

## 2020-10-29 DIAGNOSIS — Z0000 Encounter for general adult medical examination without abnormal findings: Secondary | ICD-10-CM | POA: Diagnosis not present

## 2020-10-29 DIAGNOSIS — I10 Essential (primary) hypertension: Secondary | ICD-10-CM | POA: Diagnosis not present

## 2020-10-29 DIAGNOSIS — I712 Thoracic aortic aneurysm, without rupture: Secondary | ICD-10-CM | POA: Diagnosis not present

## 2020-10-29 DIAGNOSIS — J309 Allergic rhinitis, unspecified: Secondary | ICD-10-CM | POA: Diagnosis not present

## 2020-10-29 DIAGNOSIS — M5459 Other low back pain: Secondary | ICD-10-CM | POA: Diagnosis not present

## 2020-10-29 DIAGNOSIS — K219 Gastro-esophageal reflux disease without esophagitis: Secondary | ICD-10-CM | POA: Diagnosis not present

## 2020-10-29 DIAGNOSIS — D6861 Antiphospholipid syndrome: Secondary | ICD-10-CM | POA: Diagnosis not present

## 2020-10-30 ENCOUNTER — Ambulatory Visit: Payer: Medicare Other | Admitting: Internal Medicine

## 2020-10-30 ENCOUNTER — Other Ambulatory Visit: Payer: Self-pay | Admitting: Internal Medicine

## 2020-10-30 DIAGNOSIS — I700 Atherosclerosis of aorta: Secondary | ICD-10-CM

## 2020-11-09 ENCOUNTER — Other Ambulatory Visit: Payer: Self-pay | Admitting: Internal Medicine

## 2020-11-12 DIAGNOSIS — K219 Gastro-esophageal reflux disease without esophagitis: Secondary | ICD-10-CM | POA: Diagnosis not present

## 2020-11-12 DIAGNOSIS — E7800 Pure hypercholesterolemia, unspecified: Secondary | ICD-10-CM | POA: Diagnosis not present

## 2020-11-12 DIAGNOSIS — I10 Essential (primary) hypertension: Secondary | ICD-10-CM | POA: Diagnosis not present

## 2020-11-12 DIAGNOSIS — R7303 Prediabetes: Secondary | ICD-10-CM | POA: Diagnosis not present

## 2020-11-16 ENCOUNTER — Other Ambulatory Visit: Payer: Medicare Other

## 2020-11-24 ENCOUNTER — Encounter: Payer: Self-pay | Admitting: Internal Medicine

## 2020-11-24 ENCOUNTER — Ambulatory Visit (INDEPENDENT_AMBULATORY_CARE_PROVIDER_SITE_OTHER)
Admission: RE | Admit: 2020-11-24 | Discharge: 2020-11-24 | Disposition: A | Discharge status: Home / Self Care | Payer: Medicare Other | Source: Ambulatory Visit | Attending: Internal Medicine | Admitting: Internal Medicine

## 2020-11-24 ENCOUNTER — Ambulatory Visit: Payer: Medicare Other | Admitting: Internal Medicine

## 2020-11-24 ENCOUNTER — Other Ambulatory Visit: Payer: Self-pay

## 2020-11-24 VITALS — BP 126/68 | HR 70 | Ht 71.0 in | Wt 222.0 lb

## 2020-11-24 DIAGNOSIS — M533 Sacrococcygeal disorders, not elsewhere classified: Secondary | ICD-10-CM | POA: Diagnosis not present

## 2020-11-24 DIAGNOSIS — M545 Low back pain, unspecified: Secondary | ICD-10-CM | POA: Diagnosis not present

## 2020-11-24 DIAGNOSIS — E669 Obesity, unspecified: Secondary | ICD-10-CM

## 2020-11-24 DIAGNOSIS — R198 Other specified symptoms and signs involving the digestive system and abdomen: Secondary | ICD-10-CM

## 2020-11-24 DIAGNOSIS — E8881 Metabolic syndrome: Secondary | ICD-10-CM | POA: Diagnosis not present

## 2020-11-24 NOTE — Progress Notes (Signed)
[name] 75 y.o. February 04, 1946 [id number]  Assessment & Plan:   Encounter Diagnoses  Name Primary?  . Coccydynia Yes  . Lumbosacral pain   . Abdominal obesity and metabolic syndrome   . Abnormal defecation     Orders Placed This Encounter  Procedures  . DG Sacrum/Coccyx  . DG Lumbar Spine 2-3 Views   He should get a new mattress - discussed. Further plans pending xrays (sports med? Ortho? Neuro) We discussed his abdominal obesity and metabolic syndrome.  He is going for a coronary CT calcium score.  I think metabolic issues are important as well.  He agrees.  If he loses belly fat many things will get better including may be his constitutional symptoms as well.   Read The Obesity Code by Dr. Sharman Cheek   Investigate and sign up for the www.dietdoctor.com website if desired and utilize those resources. Checkout Dr. Luis Abed on YouTube You can also look up Dr. Enrigue Catena on the Internet and YouTube I have provided handouts on insulin resistance, restricted feeding/intermittent fasting, and proper food choices to lower and eliminate insulin resistance and lose weight. Have a long-term approach to this and do not expect rapid results but have a 1 to 2-year timeframe to change her eating and to become fat adapted.  I appreciate the opportunity to care for you. EE:FEOFH, [name], MD   Subjective:   Chief Complaint:rectal pain  HPI  [name] returns with complaints of rectal pain wondering if he has a fistula again.  He has had a history of a perianal abscess and ended up having surgery and a fistula repair he occasionally has some spasms in the rectum that are difficult to characterize but he also has some burning sensation and aching discomfort.  It can be there for hours or longer.  Sitting worsens it.  He has low back pain as well and is very stiff particularly in the mornings.  His mattress is old and he is actively investigating a new mattress.  Sometimes he has lower  abdominal and pelvic discomfort as well.  All of this predates his prostate resection.  I had sent him to pelvic floor physical therapy last year and that helped his defecation issues quite a bit.  He only rarely has to use a suppository to defecate now.  . Colonoscopy 07/17/2019 A surgically absent prostate found on digital rectal exam. - Two diminutive polyps in the sigmoid colon and in the transverse colon, removed with a cold snare. Resected and retrieved.  Adenomas - Diverticulosis in the sigmoid colon. - Internal hemorrhoids. Allergies  Allergen Reactions  . Aspirin Other (See Comments)    REACTION: gi upset with higher dose  . Penicillins Itching   Current Meds  Medication Sig  . ALPRAZolam (XANAX) 1 MG tablet Take 1 mg by mouth at bedtime as needed for anxiety.  . Cholecalciferol (VITAMIN D3) 50 MCG (2000 UT) capsule   . co-enzyme Q-10 30 MG capsule Take 30 mg by mouth every evening.  Marland Kitchen CRESTOR 5 MG tablet TAKE 1 TABLET BY MOUTH EVERY DAY (Patient taking differently: Take 20 mg by mouth daily.)  . fish oil-omega-3 fatty acids 1000 MG capsule Take 1 g by mouth daily.  . fluticasone (FLONASE) 50 MCG/ACT nasal spray USE 2 SPRAYS IN EACH NOSTRIL EVERY NIGHT  . hydrochlorothiazide (HYDRODIURIL) 25 MG tablet Take 25 mg by mouth daily.  . hydrocortisone (ANUSOL-HC) 2.5 % rectal cream PLACE RECTALLY AT BEDTIME AS NEEDED FOR HEMORRHOIDS.  Marland Kitchen Hypertonic  Nasal Wash (SINUS RINSE BOTTLE KIT) PACK Place into the nose at bedtime.  . methylcellulose oral powder Take 1 packet by mouth 2 (two) times daily.  . metoprolol tartrate (LOPRESSOR) 100 MG tablet TAKE 1 TABLET BY MOUTH TWICE A DAY  . Multiple Vitamin (MULTIVITAMIN) tablet Take 1 tablet by mouth daily.  Marland Kitchen oxymetazoline (AFRIN) 0.05 % nasal spray Place 2 sprays into the nose 2 (two) times daily.  . pantoprazole (PROTONIX) 40 MG tablet TAKE 1 TABLET BY MOUTH EVERY DAY  . ramipril (ALTACE) 10 MG capsule TAKE 1 CAPSULE (10 MG TOTAL) BY MOUTH 2  (TWO) TIMES DAILY  . rivaroxaban (XARELTO) 20 MG TABS tablet Take 20 mg by mouth daily.  . sertraline (ZOLOFT) 100 MG tablet Take 100 mg by mouth daily.   Past Medical History:  Diagnosis Date  . Antiphospholipid antibody syndrome ([place]) 05/22/2012   Found subsequent to recurrent PE 9/13  . Arthritis   . Bladder cancer ([place]) dx'd 2009   surg only  . Chronic anal fissure    w/ recurrence's  . Chronic anxiety   . Depression   . ED (erectile dysfunction)   . Esophageal candidiasis ([place]) 03/14/2017  . Fatty liver   . GERD (gastroesophageal reflux disease)   . Heart murmur   . Hiatal hernia   . History of adenomatous polyp of colon    2005  . History of bladder cancer urologist-  dr grapey   papillary TCC  s/p  turbt  . History of DVT (deep vein thrombosis)    post prostate surgery 2010  right LLE  . History of esophageal stricture    w/ dilation  . History of gastric polyp    benign 2010  . History of prostate cancer urologist- dr grapey/  oncology-  dr Alen Blew   Stage  T1c , Gleason 3+3;   s/p  prostatectomy 03-08-210//  per pt currently PSA nondetectable   . History of pulmonary embolus (PE)    post prostate surgery 2010  and 2013 x2 right side per CT scan (found to have lupus anticoaglated )  . History of TIA (transient ischemic attack)    07/ 2012 noted on scans remote probable tia's in  age 54's  . History of TMJ syndrome   . HTN (hypertension)   . Hyperlipidemia   . Lupus anticoagulant positive    found 2013 when pt had PE  . Moderate aortic regurgitation    with no  stenosis   . OSA on CPAP    per study severe osa 12-14-2010  . Prolapsed internal hemorrhoids, grade 2 03/31/2014  . prostate ca dx'd 2010   surg only  . RLS (restless legs syndrome)   . RLS (restless legs syndrome)   . Sleep apnea   . Symptomatic PVCs cardiologist-  dr berry   intermittant  . Wears glasses    Past Surgical History:  Procedure Laterality Date  . APPENDECTOMY  age 7   and Left  Inguinal Hernia Repair  . CARDIOVASCULAR STRESS TEST  06-30-2011   dr berry   normal nuclear study/  normal LV function and wall motion, ef 56%  . COLONOSCOPY  last one 10-19-2011  . CYSTO/ BLADDER BX/  RETROGRADE PYELOGRAM/  TRANSRECTAL PROSTATE BX'S  07-30-2008  . ESOPHAGOGASTRODUODENOSCOPY  last one 06-27-2011  . EVALUATION UNDER ANESTHESIA WITH FISTULECTOMY N/A 01/14/2016   Procedure: EXAM UNDER ANESTHESIA WITH REPAIR OF PERIRECTAL FISTULA  (LIFT);  Surgeon: Michael Boston, MD;  Location: [facility];  Service: General;  Laterality: N/A;  . LAPAROSCOPIC CHOLECYSTECTOMY  05-25-2007   and Excision cyst back of neck  . ROBOT ASSISTED LAPAROSCOPIC RADICAL PROSTATECTOMY  10-20-2008  . STRABISMUS SURGERY Bilateral age 21  . TONSILLECTOMY  age 65  . TRANSTHORACIC ECHOCARDIOGRAM  03-23-2015   dr berry   grade 1 diastolic dysfunction, ef 65-99%/  mild AV thickened leaflets with no stenosis,  moderate AR/  mild ascending aorta ilatation3.9cm/  trivial MR and TR  . UPPER GASTROINTESTINAL ENDOSCOPY     Social History   Social History Narrative   Married to [place] ([place])   Gets regular exercise.   Daily caffeine- 2 cups daily.   Education: MS Estate manager/land agent   2 kids + grandchildren   Enjoys on track car driving (e.g, VIR)      family history includes Cirrhosis in an other family member; Colon cancer in his father; Colon polyps in his brother and father; Crohn's disease in his brother; Diabetes in his maternal grandmother; Heart disease in his maternal grandmother; Hypertension in his father and mother; Stroke in his father.   Review of Systems As per HPI  Objective:   Physical Exam BP 126/68   Pulse 70   Ht 5' 11"  (1.803 m)   Wt 222 lb (100.7 kg)   BMI 30.96 kg/m  Obese elderly white man in no acute distress Rectal - sl erythema in the perianal region.  Digital exam is nontender and there is no sign of fissure.  Prostate is absent.  The coccyx is tender  internally and externally.  Mild to moderate.  It is not excessively mobile.  Anoscopy demonstrates erythematous grade 2 internal and external hemorrhoids in all positions.  No proctitis seen.  Lower extremity strength is intact the patient is able to squat down and rise up without significant difficulty and no assistance.

## 2020-11-24 NOTE — Patient Instructions (Signed)
Please go to the basement for x-rays today before leaving.  Dr Carlean Purl is providing you with diet information to read and follow.   I appreciate the opportunity to care for you. Andrew Rusk, MD, [facility]

## 2020-11-26 DIAGNOSIS — K760 Fatty (change of) liver, not elsewhere classified: Secondary | ICD-10-CM | POA: Diagnosis not present

## 2020-11-26 DIAGNOSIS — E7800 Pure hypercholesterolemia, unspecified: Secondary | ICD-10-CM | POA: Diagnosis not present

## 2020-11-30 ENCOUNTER — Other Ambulatory Visit: Payer: Self-pay

## 2020-11-30 DIAGNOSIS — M533 Sacrococcygeal disorders, not elsewhere classified: Secondary | ICD-10-CM

## 2020-12-02 ENCOUNTER — Ambulatory Visit
Admission: RE | Admit: 2020-12-02 | Discharge: 2020-12-02 | Disposition: A | Discharge status: Home / Self Care | Payer: Medicare Other | Source: Ambulatory Visit | Attending: Internal Medicine | Admitting: Internal Medicine

## 2020-12-02 DIAGNOSIS — E785 Hyperlipidemia, unspecified: Secondary | ICD-10-CM | POA: Diagnosis not present

## 2020-12-02 DIAGNOSIS — I700 Atherosclerosis of aorta: Secondary | ICD-10-CM

## 2020-12-12 DIAGNOSIS — I10 Essential (primary) hypertension: Secondary | ICD-10-CM | POA: Diagnosis not present

## 2020-12-12 DIAGNOSIS — K219 Gastro-esophageal reflux disease without esophagitis: Secondary | ICD-10-CM | POA: Diagnosis not present

## 2020-12-12 DIAGNOSIS — E7800 Pure hypercholesterolemia, unspecified: Secondary | ICD-10-CM | POA: Diagnosis not present

## 2020-12-12 DIAGNOSIS — R7303 Prediabetes: Secondary | ICD-10-CM | POA: Diagnosis not present

## 2020-12-14 DIAGNOSIS — I10 Essential (primary) hypertension: Secondary | ICD-10-CM | POA: Diagnosis not present

## 2020-12-14 DIAGNOSIS — E7800 Pure hypercholesterolemia, unspecified: Secondary | ICD-10-CM | POA: Diagnosis not present

## 2020-12-23 ENCOUNTER — Other Ambulatory Visit: Payer: Self-pay

## 2020-12-23 ENCOUNTER — Ambulatory Visit: Payer: Medicare Other | Admitting: Cardiovascular Disease

## 2020-12-23 ENCOUNTER — Telehealth: Payer: Self-pay

## 2020-12-23 ENCOUNTER — Encounter: Payer: Self-pay | Admitting: Cardiovascular Disease

## 2020-12-23 VITALS — BP 130/60 | HR 65 | Ht 71.0 in | Wt 222.0 lb

## 2020-12-23 DIAGNOSIS — I712 Thoracic aortic aneurysm, without rupture, unspecified: Secondary | ICD-10-CM

## 2020-12-23 DIAGNOSIS — R931 Abnormal findings on diagnostic imaging of heart and coronary circulation: Secondary | ICD-10-CM

## 2020-12-23 DIAGNOSIS — G4733 Obstructive sleep apnea (adult) (pediatric): Secondary | ICD-10-CM | POA: Diagnosis not present

## 2020-12-23 DIAGNOSIS — I351 Nonrheumatic aortic (valve) insufficiency: Secondary | ICD-10-CM | POA: Diagnosis not present

## 2020-12-23 DIAGNOSIS — I2699 Other pulmonary embolism without acute cor pulmonale: Secondary | ICD-10-CM

## 2020-12-23 NOTE — Assessment & Plan Note (Signed)
History of thoracic aortic aneurysm measuring 45 mm by CTA recently performed 06/03/2020 followed by Dr. Cyndia Bent

## 2020-12-23 NOTE — Assessment & Plan Note (Signed)
Coronary calcium score was recently performed by [name] on 12/02/2020 revealing a total coronary calcium score 2079 with equally distributed calcium in all 3 coronary arteries.  He is totally asymptomatic.  He exercises 6 days a week on a bicycle.  Last Myoview was - 06/30/2011.  I do not feel compelled to repeat stress test at this time.

## 2020-12-23 NOTE — Telephone Encounter (Signed)
We received a new referral for this patient who has seen Dr Jannifer Franklin in the past (2012). He is requesting a provider switch to Dr Felecia Shelling. Please advise

## 2020-12-23 NOTE — Progress Notes (Signed)
12/23/2020 [name]   01-08-1946  [id number]  Primary Physician Andrew Pretty, MD Primary Cardiologist: Andrew Harp MD [name], [place]  HPI:  [name] is a 75 y.o.  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 I last saw him 05/01/2019.Marland Kitchen He has a history of remote tobacco abuse, having smoked cigars in the past, treated hypertension, dyslipidemia. He has never had a heart attack or stroke. He has had bladder and prostate cancer, and he has had prostate surgery. He is followed by [name] and has been evaluated by [name] in the past with a Myoview stress test that was normal and an event monitor that showed PVCs for palpitations. He does drink 2-4 cups of coffee a day and has had palpitations since he was a teenager. He saw [name] complaining of right-sided chest pain and shortness of breath. He had a CT scan done April 19, 2012 that showed 2 small subsegmental right-sided pulmonary emboli of unclear origin. There is no evidence of DVT. He was placed on Xarelto. [name] did a hypercoagulable workup which apparently, according to the patient, was positive for lupus anticoagulant. Since I saw him hehas remained asymptomatic. We have been following his small thoracic aortic aneurysm which a year ago measured 4.7 cm   He had a chest CTA performed 06/05/2019 revealed his thoracic aortic aneurysm measured 4.5 cm which has been stable.  [name] follows this.  He additionally had a 2D echo performed 05/01/2020 that showed mild to moderate aortic insufficiency, normal LV function with grade 1 diastolic dysfunction.  Since I saw him 8 months ago he continues to do well.  [name] follows his thoracic aortic aneurysm which was recently measured by CTA 06/03/2020 at 4.5 cm, stable.  2D echo performed 05/01/2020 showed normal  LV systolic function with mild to moderate AI, unchanged.  His PCP, [name], recently performed a coronary calcium score 12/02/2020 which measured 2079.  This was equally distributed in all 3 coronary arteries.  He is completely asymptomatic.  He exercises 6 days a week riding his bicycle, walks up flights of stairs without symptoms.  His last Myoview performed 06/30/2011 was negative.  Current Meds  Medication Sig  . ALPRAZolam (XANAX) 1 MG tablet Take 1 mg by mouth at bedtime as needed for anxiety.  . Cholecalciferol (VITAMIN D3) 50 MCG (2000 UT) capsule   . co-enzyme Q-10 30 MG capsule Take 30 mg by mouth every evening.  . fish oil-omega-3 fatty acids 1000 MG capsule Take 1 g by mouth daily.  . fluticasone (FLONASE) 50 MCG/ACT nasal spray USE 2 SPRAYS IN EACH NOSTRIL EVERY NIGHT  . hydrochlorothiazide (HYDRODIURIL) 25 MG tablet Take 25 mg by mouth daily.  . hydrocortisone (ANUSOL-HC) 2.5 % rectal cream PLACE RECTALLY AT BEDTIME AS NEEDED FOR HEMORRHOIDS.  Marland Kitchen Hypertonic Nasal Wash (SINUS RINSE BOTTLE KIT) PACK Place into the nose at bedtime.  . methylcellulose oral powder Take 1 packet by mouth 2 (two) times daily.  . metoprolol tartrate (LOPRESSOR) 100 MG tablet TAKE 1 TABLET BY MOUTH TWICE A DAY  . Multiple Vitamin (MULTIVITAMIN) tablet Take 1 tablet by mouth daily.  Marland Kitchen oxymetazoline (AFRIN) 0.05 % nasal spray Place 2 sprays into the nose 2 (two) times daily.  . pantoprazole (PROTONIX) 40 MG tablet TAKE 1 TABLET BY MOUTH EVERY DAY  . ramipril (ALTACE)  10 MG capsule TAKE 1 CAPSULE (10 MG TOTAL) BY MOUTH 2 (TWO) TIMES DAILY  . rivaroxaban (XARELTO) 20 MG TABS tablet Take 20 mg by mouth daily.  . rosuvastatin (CRESTOR) 20 MG tablet Take 20 mg by mouth daily.  . sertraline (ZOLOFT) 100 MG tablet Take 100 mg by mouth daily.  . [DISCONTINUED] CRESTOR 5 MG tablet TAKE 1 TABLET BY MOUTH EVERY DAY (Patient taking differently: Take 20 mg by mouth daily.)     Allergies  Allergen Reactions  .  Aspirin Other (See Comments)    REACTION: gi upset with higher dose  . Penicillins Itching    Social History   Socioeconomic History  . Marital status: Married    Spouse name: Not on file  . Number of children: 2  . Years of education: Not on file  . Highest education level: Not on file  Occupational History  . Occupation: Art gallery manager  . Occupation: FIELD APPLICATIONS    Employer: [employer]  Tobacco Use  . Smoking status: Current Some Day Smoker    Years: 51.00    Types: Cigars  . Smokeless tobacco: Never Used  . Tobacco comment: 5 cigars /week--/  smoked cigarettes for 10 years quit 1990's  Vaping Use  . Vaping Use: Never used  Substance and Sexual Activity  . Alcohol use: Yes    Alcohol/week: 28.0 standard drinks    Types: 14 Glasses of wine, 14 Cans of beer per week    Comment: 2 beers a day  . Drug use: No  . Sexual activity: Not on file  Other Topics Concern  . Not on file  Social History Narrative   Married to [name])   Gets regular exercise.   Daily caffeine- 2 cups daily.   Education: MS Estate manager/land agent   2 kids + grandchildren   Enjoys on track car driving (e.g, VIR)      Social Determinants of Health   Financial Resource Strain: Not on file  Food Insecurity: Not on file  Transportation Needs: Not on file  Physical Activity: Not on file  Stress: Not on file  Social Connections: Not on file  Intimate Partner Violence: Not on file     Review of Systems: General: negative for chills, fever, night sweats or weight changes.  Cardiovascular: negative for chest pain, dyspnea on exertion, edema, orthopnea, palpitations, paroxysmal nocturnal dyspnea or shortness of breath Dermatological: negative for rash Respiratory: negative for cough or wheezing Urologic: negative for hematuria Abdominal: negative for nausea, vomiting, diarrhea, bright red blood per rectum, melena, or hematemesis Neurologic: negative for visual changes, syncope,  or dizziness All other systems reviewed and are otherwise negative except as noted above.    Blood pressure 130/60, pulse 65, height 5' 11"  (1.803 m), weight 222 lb (100.7 kg).  General appearance: alert and no distress Neck: no adenopathy, no carotid bruit, no JVD, supple, symmetrical, trachea midline and thyroid not enlarged, symmetric, no tenderness/mass/nodules Lungs: clear to auscultation bilaterally Heart: regular rate and rhythm, S1, S2 normal, no murmur, click, rub or gallop Extremities: extremities normal, atraumatic, no cyanosis or edema Pulses: 2+ and symmetric Skin: Skin color, texture, turgor normal. No rashes or lesions Neurologic: Alert and oriented X 3, normal strength and tone. Normal symmetric reflexes. Normal coordination and gait  EKG sinus rhythm at 65 without ST or T wave changes.  I personally reviewed this EKG.  ASSESSMENT AND PLAN:   HYPERLIPIDEMIA History of hyperlipidemia on rosuvastatin.  His dose was recently increased by Dr.  Shelia Ford, his PCP.  Recent NMR lipid profile performed 11/26/2020 revealed an LDL particle number of 906 with a total LDL of 73, HDL 46 and total cholesterol level of 139  Essential hypertension History of essential hypertension blood pressure measured today 130/60.  He is on hydrochlorothiazide metoprolol and ramipril.  Sleep apnea History of obstructive sleep apnea on CPAP.  Pulmonary embolus History of DVT and pulmonary embolism past with antiphospholipid antibody on Xarelto oral anticoagulation.  Thoracic aortic aneurysm [facility]) History of thoracic aortic aneurysm measuring 45 mm by CTA recently performed 06/03/2020 followed by [name]  Aortic insufficiency History of mild to moderate aortic insufficiency in the past with recent 2D echo performed 05/01/2020 again showing normal LV size and function with mild to moderate aortic insufficiency.  This will be repeated on annual basis.  Elevated coronary artery calcium score Coronary  calcium score was recently performed by [name] on 12/02/2020 revealing a total coronary calcium score 2079 with equally distributed calcium in all 3 coronary arteries.  He is totally asymptomatic.  He exercises 6 days a week on a bicycle.  Last Myoview was - 06/30/2011.  I do not feel compelled to repeat stress test at this time.      Andrew Harp MD FACP,FACC,FAHA, [facility] 12/23/2020 3:55 PM

## 2020-12-23 NOTE — Assessment & Plan Note (Signed)
History of essential hypertension blood pressure measured today 130/60.  He is on hydrochlorothiazide metoprolol and ramipril.

## 2020-12-23 NOTE — Assessment & Plan Note (Signed)
History of DVT and pulmonary embolism past with antiphospholipid antibody on Xarelto oral anticoagulation.

## 2020-12-23 NOTE — Assessment & Plan Note (Signed)
History of mild to moderate aortic insufficiency in the past with recent 2D echo performed 05/01/2020 again showing normal LV size and function with mild to moderate aortic insufficiency.  This will be repeated on annual basis.

## 2020-12-23 NOTE — Assessment & Plan Note (Signed)
History of hyperlipidemia on rosuvastatin.  His dose was recently increased by [name], his PCP.  Recent NMR lipid profile performed 11/26/2020 revealed an LDL particle number of 906 with a total LDL of 73, HDL 46 and total cholesterol level of 139

## 2020-12-23 NOTE — Assessment & Plan Note (Signed)
History of obstructive sleep apnea on CPAP. 

## 2020-12-23 NOTE — Patient Instructions (Signed)
Medication Instructions:  Your physician recommends that you continue on your current medications as directed. Please refer to the Current Medication list given to you today.  *If you need a refill on your cardiac medications before your next appointment, please call your pharmacy*   Testing/Procedures: Your physician has requested that you have an echocardiogram. Echocardiography is a painless test that uses sound waves to create images of your heart. It provides your doctor with information about the size and shape of your heart and how well your heart's chambers and valves are working. This procedure takes approximately one hour. There are no restrictions for this procedure. This procedure is done at [address]rd [address]. To be done in Sept. 2022.     Follow-Up: At [facility], you and your health needs are our priority.  As part of our continuing mission to provide you with exceptional heart care, we have created designated Provider Care Teams.  These Care Teams include your primary Cardiologist (physician) and Advanced Practice Providers (APPs -  Physician Assistants and Nurse Practitioners) who all work together to provide you with the care you need, when you need it.  We recommend signing up for the patient portal called "MyChart".  Sign up information is provided on this After Visit Summary.  MyChart is used to connect with patients for Virtual Visits (Telemedicine).  Patients are able to view lab/test results, encounter notes, upcoming appointments, etc.  Non-urgent messages can be sent to your provider as well.   To learn more about what you can do with MyChart, go to [email or web address].    Your next appointment:   12 month(s)  The format for your next appointment:   In Person  Provider:   Quay Burow, MD

## 2020-12-23 NOTE — Telephone Encounter (Signed)
Switch is okay with me, I apparently saw him quite a number of years ago, no records in epic.  He does have an MRI of the brain that shows 2 lesions that could be suggestive of demyelinating disease, but this gentleman is now 75 years old, doubt that this is an issue for him currently.

## 2021-01-07 ENCOUNTER — Ambulatory Visit: Payer: Medicare Other | Admitting: Family Medicine

## 2021-01-07 DIAGNOSIS — G4733 Obstructive sleep apnea (adult) (pediatric): Secondary | ICD-10-CM | POA: Diagnosis not present

## 2021-01-12 DIAGNOSIS — E7800 Pure hypercholesterolemia, unspecified: Secondary | ICD-10-CM | POA: Diagnosis not present

## 2021-01-12 DIAGNOSIS — I10 Essential (primary) hypertension: Secondary | ICD-10-CM | POA: Diagnosis not present

## 2021-01-12 DIAGNOSIS — K219 Gastro-esophageal reflux disease without esophagitis: Secondary | ICD-10-CM | POA: Diagnosis not present

## 2021-01-25 ENCOUNTER — Encounter: Payer: Self-pay | Admitting: Internal Medicine

## 2021-01-25 ENCOUNTER — Ambulatory Visit: Payer: Medicare Other | Admitting: Internal Medicine

## 2021-01-25 VITALS — BP 136/70 | HR 68 | Ht 70.0 in | Wt 217.0 lb

## 2021-01-25 DIAGNOSIS — E8881 Metabolic syndrome: Secondary | ICD-10-CM | POA: Diagnosis not present

## 2021-01-25 DIAGNOSIS — I700 Atherosclerosis of aorta: Secondary | ICD-10-CM

## 2021-01-25 DIAGNOSIS — K760 Fatty (change of) liver, not elsewhere classified: Secondary | ICD-10-CM

## 2021-01-25 DIAGNOSIS — M533 Sacrococcygeal disorders, not elsewhere classified: Secondary | ICD-10-CM | POA: Diagnosis not present

## 2021-01-25 NOTE — Patient Instructions (Signed)
Keep trying to eat better. It isn't easy but you can do it.  Check out the book Metabolical by Hali Marry Consider his Fat Chance cookbook also.  See you in 3 months.  I appreciate the opportunity to care for you. Gatha Mayer, MD, [facility]

## 2021-01-25 NOTE — Progress Notes (Signed)
[name] 75 y.o. [birth year] [id number]  Assessment & Plan:   Encounter Diagnoses  Name Primary?   Hepatic steatosis Yes   Coccydynia    Abdominal obesity and metabolic syndrome    Abdominal aortic atherosclerosis (HCC)      [name] is seeing Andrew. Tamala Ford soon and I am hopeful that we will be helpful for his coccydynia and other orthopedic and joint issues.  Regarding his hepatic steatosis and metabolic syndrome he will continue to work on diet modification and healthier eating.  He understands this is a long process and is difficult but important.  I will see him back in 3 months.  CC: Andrew Pretty, MD  Subjective:   Chief Complaint: Steatosis HPI [name] presents for follow-up he has some occasional right upper quadrant and left upper quadrant pain that she has had in the past.  He is due to see [name] in 2 days regarding coccydynia and other joint pain issues.  He is working on trying to change eating though it is very difficult.  When he was seen here before we talked about how he has metabolic syndrome and needs to work on that for a better quality of life and overall health.  It is a struggle but he is trying some.  He understands that cereals are basically not worth eating given all the sugar and carbohydrates.  He is working with his wife on this as well.  Very little soda consumption.  Trying to switch to chocolate without sugar.  Very little alcohol use  Had a cardiac scoring CT which was markedly abnormal and showed severe hepatic steatosis.  It also showed aortic atherosclerosis and aneurysmal dilation of the ascending thoracic aorta which was stable and already a known issue.  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 Allergies  Allergen Reactions   Aspirin Other (See Comments)    REACTION: gi upset with higher dose   Penicillins Itching   Current Meds  Medication Sig   ALPRAZolam (XANAX) 1 MG tablet Take 1 tablet by mouth 2 (two) times daily.   Cholecalciferol (VITAMIN D3) 50 MCG (2000 UT) capsule    Ciclopirox (CICLOPIROX TREATMENT) 8 % KIT Apply 1 application topically at bedtime.   co-enzyme Q-10 30 MG capsule Take 30 mg by mouth every evening.   fish oil-omega-3 fatty acids 1000 MG capsule Take 1 g by mouth daily.   fluticasone (FLONASE) 50 MCG/ACT nasal spray USE 2 SPRAYS IN EACH NOSTRIL EVERY NIGHT   hydrochlorothiazide (HYDRODIURIL) 25 MG tablet Take 25 mg by mouth daily.   hydrocortisone (ANUSOL-HC) 2.5 % rectal cream PLACE RECTALLY AT BEDTIME AS NEEDED FOR HEMORRHOIDS.   Hypertonic Nasal Wash (SINUS RINSE BOTTLE KIT) PACK Place into the nose at bedtime.   metoprolol tartrate (LOPRESSOR) 100 MG tablet TAKE 1 TABLET BY MOUTH TWICE A DAY   Multiple Vitamin (MULTIVITAMIN) tablet Take 1 tablet by mouth daily.   oxymetazoline (AFRIN) 0.05 % nasal spray Place 2 sprays into the nose 2 (two) times daily.   pantoprazole (PROTONIX) 40 MG tablet TAKE 1 TABLET BY MOUTH EVERY DAY   ramipril (ALTACE) 10 MG capsule TAKE 1 CAPSULE (10 MG TOTAL) BY MOUTH 2 (TWO) TIMES  DAILY   rivaroxaban (XARELTO) 20 MG TABS tablet Take 20 mg by mouth daily.   rosuvastatin (CRESTOR) 20 MG tablet Take 20 mg by mouth daily.   sertraline (ZOLOFT) 100 MG tablet Take 100 mg by mouth daily.   traMADol (ULTRAM) 50 MG tablet Take 1-2 tablets by mouth as needed.   Wheat Dextrin (BENEFIBER PO) Take 1 Dose by mouth 2 (two) times daily.   Past Medical History:  Diagnosis Date   Antiphospholipid antibody syndrome ([place]) 05/22/2012   Found subsequent to recurrent PE 9/13   Arthritis    Bladder cancer ([place]) dx'd 2009   surg only   Chronic anal fissure    w/ recurrence's   Chronic anxiety    Depression    ED  (erectile dysfunction)    Esophageal candidiasis ([place]) 03/14/2017   Fatty liver    GERD (gastroesophageal reflux disease)    Heart murmur    Hepatic steatosis    Hiatal hernia    History of adenomatous polyp of colon    2005   History of bladder cancer urologist-  Andrew [name]   papillary TCC  s/p  turbt   History of DVT (deep vein thrombosis)    post prostate surgery 2010  right LLE   History of esophageal stricture    w/ dilation   History of gastric polyp    benign 2010   History of prostate cancer urologist- Andrew [name]/  oncology-  Andrew [name]   Stage  T1c , Gleason 3+3;   s/p  prostatectomy 03-08-210//  per pt currently PSA nondetectable    History of pulmonary embolus (PE)    post prostate surgery 2010  and 2013 x2 right side per CT scan (found to have lupus anticoaglated )   History of TIA (transient ischemic attack)    07/ 2012 noted on scans remote probable tia's in  age 89's   History of TMJ syndrome    HTN (hypertension)    Hyperlipidemia    Lupus anticoagulant positive    found 2013 when pt had PE   Moderate aortic regurgitation    with no  stenosis    OSA on CPAP    per study severe osa 12-14-2010   Prolapsed internal hemorrhoids, grade 2 03/31/2014   prostate ca dx'd 2010   surg only   RLS (restless legs syndrome)    RLS (restless legs syndrome)    Sleep apnea    Symptomatic PVCs cardiologist-  [name]   intermittant   Wears glasses    Past Surgical History:  Procedure Laterality Date   APPENDECTOMY  age 51   and Left Inguinal Hernia Repair   CARDIOVASCULAR STRESS TEST  06-30-2011   [name]   normal nuclear study/  normal LV function and wall motion, ef 56%   COLONOSCOPY  last one 10-19-2011   CYSTO/ BLADDER BX/  RETROGRADE PYELOGRAM/  TRANSRECTAL PROSTATE BX'S  07-30-2008   ESOPHAGOGASTRODUODENOSCOPY  last one 06-27-2011   EVALUATION UNDER ANESTHESIA WITH FISTULECTOMY N/A 01/14/2016   Procedure: EXAM UNDER ANESTHESIA WITH REPAIR OF PERIRECTAL FISTULA   (LIFT);  Surgeon: Michael Boston, MD;  [facility] ([place]);  Service: General;  Laterality: N/A;   LAPAROSCOPIC CHOLECYSTECTOMY  05-25-2007   and Excision cyst back of neck   ROBOT ASSISTED LAPAROSCOPIC RADICAL PROSTATECTOMY  10-20-2008   STRABISMUS SURGERY Bilateral age 78   TONSILLECTOMY  age 88   TRANSTHORACIC ECHOCARDIOGRAM  03-23-2015   [name]   grade 1 diastolic dysfunction,  ef 55-60%/  mild AV thickened leaflets with no stenosis,  moderate AR/  mild ascending aorta ilatation3.9cm/  trivial MR and TR   UPPER GASTROINTESTINAL ENDOSCOPY     Social History   Social History Narrative   Married to [place] ([place])   Gets regular exercise.   Daily caffeine- 2 cups daily.   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   Review of Systems As above  Objective:   Physical Exam BP 136/70 (BP Location: Left Arm, Patient Position: Sitting, Cuff Size: Normal)   Pulse 68   Ht 5' 10"  (1.778 m) Comment: height measured without shoes  Wt 217 lb (98.4 kg)   BMI 31.14 kg/m

## 2021-02-02 ENCOUNTER — Ambulatory Visit: Payer: Medicare Other | Admitting: Family Medicine

## 2021-02-03 ENCOUNTER — Other Ambulatory Visit: Payer: Self-pay

## 2021-02-03 ENCOUNTER — Ambulatory Visit: Payer: Medicare Other | Admitting: Family Medicine

## 2021-02-03 ENCOUNTER — Encounter: Payer: Self-pay | Admitting: Family Medicine

## 2021-02-03 DIAGNOSIS — M5136 Other intervertebral disc degeneration, lumbar region: Secondary | ICD-10-CM

## 2021-02-03 MED ORDER — GABAPENTIN 100 MG PO CAPS: 200.0000 mg | ORAL_CAPSULE | Freq: Every day | ORAL | 3 refills | Status: DC

## 2021-02-03 NOTE — Patient Instructions (Addendum)
Good to see you Gabapentin 100 mg at night Exercises for low back Pennsaid 2 times a day ice 20 mins  See me again in 5-6 weeks

## 2021-02-03 NOTE — Progress Notes (Signed)
[place] [address] [place] Phone: [phone] Subjective:   I [name] am serving as a Education administrator for Dr. Hulan Saas.  This visit occurred during the SARS-CoV-2 public health emergency.  Safety protocols were in place, including screening questions prior to the visit, additional usage of staff PPE, and extensive cleaning of exam room while observing appropriate contact time as indicated for disinfecting solutions.   I'm seeing this patient by the request  of:  [name], MD  CC: Low back pain  [id number]  [name] is a 75 y.o. male coming in with complaint of coccyodynia. Low back, pelvis, and right hip pain. About 5 years ago he got an epidural. States it helped.  Onset- 3 weeks ago Location -  Duration-  Character- dull  Aggravating factors- worse with sitting, laying on his back  Reliving factors-  Therapies tried- epidural Severity- 4/10   Xray coccyx 11/24/2020   IMPRESSION: No displaced fracture or other radiographic abnormality of the sacrum or coccyx to explain pain.  Xray lumbar 11/24/2020 IMPRESSION: No fracture or dislocation of the lumbar spine. Mild multilevel disc space height loss and osteophytosis throughout. Mild multilevel facet degenerative change. Lumbar disc and neural foraminal pathology may be further evaluated by MRI if indicated by neurologically localizing signs and symptoms.  Past Medical History:  Diagnosis Date   Antiphospholipid antibody syndrome ([place]) 05/22/2012   Found subsequent to recurrent PE 9/13   Arthritis    Bladder cancer ([place]) dx'd 2009   surg only   Chronic anal fissure    w/ recurrence's   Chronic anxiety    Depression    ED (erectile dysfunction)    Esophageal candidiasis ([place]) 03/14/2017   Fatty liver    GERD (gastroesophageal reflux disease)    Heart murmur    Hepatic steatosis    Hiatal hernia    History of adenomatous polyp of colon    2005    History of bladder cancer urologist-  dr Risa Grill   papillary TCC  s/p  turbt   History of DVT (deep vein thrombosis)    post prostate surgery 2010  right LLE   History of esophageal stricture    w/ dilation   History of gastric polyp    benign 2010   History of prostate cancer urologist- dr grapey/  oncology-  dr Alen Blew   Stage  T1c , Gleason 3+3;   s/p  prostatectomy 03-08-210//  per pt currently PSA nondetectable    History of pulmonary embolus (PE)    post prostate surgery 2010  and 2013 x2 right side per CT scan (found to have lupus anticoaglated )   History of TIA (transient ischemic attack)    07/ 2012 noted on scans remote probable tia's in  age 89's   History of TMJ syndrome    HTN (hypertension)    Hyperlipidemia    Lupus anticoagulant positive    found 2013 when pt had PE   Moderate aortic regurgitation    with no  stenosis    OSA on CPAP    per study severe osa 12-14-2010   Prolapsed internal hemorrhoids, grade 2 03/31/2014   prostate ca dx'd 2010   surg only   RLS (restless legs syndrome)    RLS (restless legs syndrome)    Sleep apnea    Symptomatic PVCs cardiologist-  dr berry   intermittant   Wears glasses    Past Surgical History:  Procedure Laterality  Date   APPENDECTOMY  age 9   and Left Inguinal Hernia Repair   CARDIOVASCULAR STRESS TEST  06-30-2011   dr berry   normal nuclear study/  normal LV function and wall motion, ef 56%   COLONOSCOPY  last one 10-19-2011   CYSTO/ BLADDER BX/  RETROGRADE PYELOGRAM/  TRANSRECTAL PROSTATE BX'S  07-30-2008   ESOPHAGOGASTRODUODENOSCOPY  last one 06-27-2011   EVALUATION UNDER ANESTHESIA WITH FISTULECTOMY N/A 01/14/2016   Procedure: EXAM UNDER ANESTHESIA WITH REPAIR OF PERIRECTAL FISTULA  (LIFT);  Surgeon: Michael Boston, MD;  Location: [facility];  Service: General;  Laterality: N/A;   LAPAROSCOPIC CHOLECYSTECTOMY  05-25-2007   and Excision cyst back of neck   ROBOT ASSISTED LAPAROSCOPIC RADICAL  PROSTATECTOMY  10-20-2008   STRABISMUS SURGERY Bilateral age 67   TONSILLECTOMY  age 55   TRANSTHORACIC ECHOCARDIOGRAM  03-23-2015   dr berry   grade 1 diastolic dysfunction, ef 49-17%/  mild AV thickened leaflets with no stenosis,  moderate AR/  mild ascending aorta ilatation3.9cm/  trivial MR and TR   UPPER GASTROINTESTINAL ENDOSCOPY     Social History   Socioeconomic History   Marital status: Married    Spouse name: Not on file   Number of children: 2   Years of education: Not on file   Highest education level: Not on file  Occupational History   Occupation: Art gallery manager   Occupation: FIELD [employer]    Employer: [employer]  Tobacco Use   Smoking status: Some Days    Pack years: 0.00    Types: Cigars   Smokeless tobacco: Never   Tobacco comments:    5 cigars /week--/  smoked cigarettes for 10 years quit 1990's  Vaping Use   Vaping Use: Never used  Substance and Sexual Activity   Alcohol use: Yes    Alcohol/week: 28.0 standard drinks    Types: 14 Glasses of wine, 14 Cans of beer per week    Comment: 2 beers a day   Drug use: No   Sexual activity: Not on file  Other Topics Concern   Not on file  Social History Narrative   Married to [name])   Gets regular exercise.   Daily caffeine- 2 cups daily.   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: Not on Art therapist Insecurity: Not on file  Transportation Needs: Not on file  Physical Activity: Not on file  Stress: Not on file  Social Connections: Not on file   Allergies  Allergen Reactions   Aspirin Other (See Comments)    REACTION: gi upset with higher dose   Penicillins Itching   Family History  Problem Relation Age of Onset   Hypertension Mother    Hypertension Father    Stroke Father    Colon polyps  Father    Colon cancer Father        questionable   Diabetes Maternal Grandmother    Heart disease Maternal Grandmother    Colon polyps Brother    Crohn's disease Brother    Cirrhosis Other        Great Uncle   Esophageal cancer Neg Hx    Rectal cancer Neg Hx    Stomach cancer Neg Hx      Current Outpatient Medications (  Cardiovascular):    hydrochlorothiazide (HYDRODIURIL) 25 MG tablet, Take 25 mg by mouth daily.   metoprolol tartrate (LOPRESSOR) 100 MG tablet, TAKE 1 TABLET BY MOUTH TWICE A DAY   ramipril (ALTACE) 10 MG capsule, TAKE 1 CAPSULE (10 MG TOTAL) BY MOUTH 2 (TWO) TIMES DAILY   rosuvastatin (CRESTOR) 20 MG tablet, Take 20 mg by mouth daily.  Current Outpatient Medications (Respiratory):    fluticasone (FLONASE) 50 MCG/ACT nasal spray, USE 2 SPRAYS IN EACH NOSTRIL EVERY NIGHT   Hypertonic Nasal Wash (SINUS RINSE BOTTLE KIT) PACK, Place into the nose at bedtime.   oxymetazoline (AFRIN) 0.05 % nasal spray, Place 2 sprays into the nose 2 (two) times daily.  Current Outpatient Medications (Analgesics):    traMADol (ULTRAM) 50 MG tablet, Take 1-2 tablets by mouth as needed.  Current Outpatient Medications (Hematological):    rivaroxaban (XARELTO) 20 MG TABS tablet, Take 20 mg by mouth daily.  Current Outpatient Medications (Other):    ALPRAZolam (XANAX) 1 MG tablet, Take 1 tablet by mouth 2 (two) times daily.   Cholecalciferol (VITAMIN D3) 50 MCG (2000 UT) capsule,    Ciclopirox (CICLOPIROX TREATMENT) 8 % KIT, Apply 1 application topically at bedtime.   co-enzyme Q-10 30 MG capsule, Take 30 mg by mouth every evening.   fish oil-omega-3 fatty acids 1000 MG capsule, Take 1 g by mouth daily.   gabapentin (NEURONTIN) 100 MG capsule, Take 2 capsules (200 mg total) by mouth at bedtime.   hydrocortisone (ANUSOL-HC) 2.5 % rectal cream, PLACE RECTALLY AT BEDTIME AS NEEDED FOR HEMORRHOIDS.   Multiple Vitamin (MULTIVITAMIN) tablet, Take 1 tablet by mouth daily.   pantoprazole  (PROTONIX) 40 MG tablet, TAKE 1 TABLET BY MOUTH EVERY DAY   sertraline (ZOLOFT) 100 MG tablet, Take 100 mg by mouth daily.   Wheat Dextrin (BENEFIBER PO), Take 1 Dose by mouth 2 (two) times daily.   Reviewed prior external information including notes and imaging from  primary care provider As well as notes that were available from care everywhere and other healthcare systems.  Past medical history, social, surgical and family history all reviewed in electronic medical record.  No pertanent information unless stated regarding to the chief complaint.   Review of Systems:  No headache, visual changes, nausea, vomiting, diarrhea, constipation, dizziness, abdominal pain, skin rash, fevers, chills, night sweats, weight loss, swollen lymph nodes, body aches, joint swelling, chest pain, shortness of breath, mood changes. POSITIVE muscle aches  Objective  Blood pressure 120/80, pulse 64, height 5' 10" (1.778 m), weight 220 lb (99.8 kg), SpO2 94 %.   General: No apparent distress alert and oriented x3 mood and affect normal, dressed appropriately.  HEENT: Pupils equal, extraocular movements intact  Respiratory: Patient's speak in full sentences and does not appear short of breath  Cardiovascular: No lower extremity edema, non tender, no erythema  Gait normal with good balance and coordination.  MSK: Low back exam does have some mild loss of lordosis.  Poor core strength noted.  Tightness with straight leg test but no true radicular symptoms noted today.  Patient does have tightness with FABER test bilaterally.  Neurovascularly intact distally.  [id number]; 15 additional minutes spent for Therapeutic exercises as stated in above notes.  This included exercises focusing on stretching, strengthening, with significant focus on eccentric aspects.   Long term goals include an improvement in range of motion, strength, endurance as well as avoiding reinjury. Patient's frequency would include in 1-2 times a day, 3-5  times a week  for a duration of 6-12 weeks. Low back exercises that included:  Pelvic tilt/bracing instruction to focus on control of the pelvic girdle and lower abdominal muscles  Glute strengthening exercises, focusing on proper firing of the glutes without engaging the low back muscles Proper stretching techniques for maximum relief for the hamstrings, hip flexors, low back and some rotation where tolerated  Proper technique shown and discussed handout in great detail with ATC.  All questions were discussed and answered.      Impression and Recommendations:     The above documentation has been reviewed and is accurate and complete Lyndal Pulley, DO

## 2021-02-04 ENCOUNTER — Encounter: Payer: Self-pay | Admitting: Family Medicine

## 2021-02-04 DIAGNOSIS — M5136 Other intervertebral disc degeneration, lumbar region: Secondary | ICD-10-CM | POA: Insufficient documentation

## 2021-02-04 NOTE — Assessment & Plan Note (Signed)
Patient does have degenerative disc disease of the lumbar spine.  Seems to be multifactorial with some poor core strength.  Patient does give intermittent history of potential radicular symptoms and does have tightness.  States that the pain is worse at night and we did discuss potentially replacing his mattress which is quite old.  In addition to this we will start on a low-dose of gabapentin.  Patient does know about this medication because his wife has been on it.  We also discussed the possibility of formal physical therapy and patient will consider beginning home exercises at this point.  Patient does have a past medical history significant for bladder as well as prostate cancer.  Patient's x-rays though within the last 2 months were completely unremarkable and patient has had no fevers, chills, or any abnormal weight loss.  I believe that patient should do fairly well with the conservative therapy and will follow-up with me again in 4 to 6 weeks.  If continuing to have difficulty we can always consider advanced imaging.

## 2021-03-04 ENCOUNTER — Other Ambulatory Visit: Payer: Self-pay | Admitting: Cardiovascular Disease

## 2021-03-04 ENCOUNTER — Other Ambulatory Visit: Payer: Self-pay | Admitting: Internal Medicine

## 2021-03-14 DIAGNOSIS — K219 Gastro-esophageal reflux disease without esophagitis: Secondary | ICD-10-CM | POA: Diagnosis not present

## 2021-03-14 DIAGNOSIS — E7800 Pure hypercholesterolemia, unspecified: Secondary | ICD-10-CM | POA: Diagnosis not present

## 2021-03-14 DIAGNOSIS — R7303 Prediabetes: Secondary | ICD-10-CM | POA: Diagnosis not present

## 2021-03-14 DIAGNOSIS — I10 Essential (primary) hypertension: Secondary | ICD-10-CM | POA: Diagnosis not present

## 2021-03-17 ENCOUNTER — Ambulatory Visit: Payer: Medicare Other | Admitting: Family Medicine

## 2021-03-23 ENCOUNTER — Ambulatory Visit: Payer: Medicare Other | Admitting: Neurology

## 2021-03-23 ENCOUNTER — Encounter: Payer: Self-pay | Admitting: Neurology

## 2021-03-23 VITALS — BP 125/73 | HR 74 | Ht 71.0 in | Wt 222.5 lb

## 2021-03-23 DIAGNOSIS — M5030 Other cervical disc degeneration, unspecified cervical region: Secondary | ICD-10-CM | POA: Diagnosis not present

## 2021-03-23 DIAGNOSIS — G4733 Obstructive sleep apnea (adult) (pediatric): Secondary | ICD-10-CM

## 2021-03-23 DIAGNOSIS — G47411 Narcolepsy with cataplexy: Secondary | ICD-10-CM

## 2021-03-23 DIAGNOSIS — R531 Weakness: Secondary | ICD-10-CM | POA: Diagnosis not present

## 2021-03-23 DIAGNOSIS — G4710 Hypersomnia, unspecified: Secondary | ICD-10-CM | POA: Diagnosis not present

## 2021-03-23 DIAGNOSIS — Z9989 Dependence on other enabling machines and devices: Secondary | ICD-10-CM | POA: Diagnosis not present

## 2021-03-23 NOTE — Progress Notes (Signed)
[facility]  [name]: [name] DOB: 09/03/1945  REFERRING DOCTOR OR PCP: Neldon Mc MD SOURCE: [name], notes from primary care, imaging and lab results  _________________________________   HISTORICAL  CHIEF COMPLAINT:  Chief Complaint  [name] presents with   New [name] (Initial Visit)    Rm 1, alone. Pt c/o of weakness in body, joint pn, sudden dizzy spells, constipation due to rectal muscle tightening. Pt was seeing neurologist for these sx, for over 20 years, and was last seen over 10 years by Dr. Jannifer Franklin. Pt here for further evaluation of ongoing sx.      HISTORY OF PRESENT ILLNESS:  I had the pleasure of seeing [name], [name], at [facility] for neurologic consultation regarding his spells of weakness and other symptoms.  He is a 75 year old reporting problems with intermittent dizzy spells and separate intermittent spells of weakness..   Additionally, he also experiences body aches, nausea, weakness and other general issues.   He has had these symptoms x many years.       He last saw Neurology in 2012 (Dr. Jannifer Franklin).  He saw neurosurgery in 2020.  MRI of the brain and cervical spine did not show any explanation for her symptoms.  The brain was normal for age and the spine showed multilevel degenerative changes but no spinal cord involvement.  No major change compared to the 2012 MRI of the brain.  The episodes of dizziness occur once a week, sometimes with changes in position but not always.   He feels mild vertigo and lightheadedness.  Denies nausea or vomiting.  His episodes of total body weakness occur about every 2 weeks and last 2 hours or so.  If he takes a nap he feels better when he wakes up.   Some episodes occur with emotion (upset or laughing).   He feels dreams are especially vivid and sometimes scare him (being chased or in a gunfight).   He denies sleep paralysis.    He is sometimes sleepy and tale naps at  times.    If not stimulated, he may fall asleep in a couple minutes.   He sleeps x 11 hours many nights and sometimes feel unrefreshed.   He has OSA and uses a CPAP.    His AHI is usually about 3/hr.   EPWORTH SLEEPINESS SCALE  On a scale of 0 - 3 what is the chance of dozing:  Sitting and Reading:   3 Watching TV:    3 Sitting inactive in a public place: 1 Passenger in car for one hour: 3 Lying down to rest in the afternoon: 3 Sitting and talking to someone: 0 Sitting quietly after lunch:  0 In a car, stopped in traffic:  0  Total (out of 24):   13/24   mild excessive sleepiness.      He also has coronary artery disease (high calcium score) but has no angina.    Stress testes were fine in the past.    CT has also shown a mild thoracic aortic aneurysm (4.5) that is followed with serial scanning.    He reports some other symptoms including several episodes of thrush and had candidal infection of esophagus a couple times.   He is not diabetic and without antithyroid at the time.  Images reviewed: MRI of the head 07/10/2019 was normal for age with very minimal chronic microvascular ischemic change.  Partially empty sella turcica (usually incidental).  There were no acute findings.  MRI of the cervical spine 03/09/2019 showed degenerative changes from C3-C4 through C6-C7 with mild spinal stenosis and mild foraminal narrowing but no nerve root compression.  The spinal cord has normal signal.   REVIEW OF SYSTEMS: Constitutional: No fevers, chills, sweats, or change in appetite Eyes: No visual changes, double vision, eye pain Ear, nose and throat: No hearing loss, ear pain, nasal congestion, sore throat Cardiovascular: No chest pain, palpitations Respiratory:  No shortness of breath at rest or with exertion.   No wheezes GastrointestinaI: No nausea, vomiting, diarrhea, abdominal pain, fecal incontinence Genitourinary:  No dysuria, urinary retention or frequency.  No  nocturia. Musculoskeletal:  No neck pain, back pain Integumentary: No rash, pruritus, skin lesions Neurological: as above Psychiatric: No depression at this time.  No anxiety Endocrine: No palpitations, diaphoresis, change in appetite, change in weigh or increased thirst Hematologic/Lymphatic:  No anemia, purpura, petechiae. Allergic/Immunologic: No itchy/runny eyes, nasal congestion, recent allergic reactions, rashes  ALLERGIES: Allergies  Allergen Reactions   Aspirin Other (See Comments)    REACTION: gi upset with higher dose   Penicillins Itching    HOME MEDICATIONS:  Current Outpatient Medications:    ALPRAZolam (XANAX) 1 MG tablet, Take 1 tablet by mouth 2 (two) times daily., Disp: , Rfl:    Cholecalciferol (VITAMIN D3) 50 MCG (2000 UT) capsule, , Disp: , Rfl:    Ciclopirox (CICLOPIROX TREATMENT) 8 % KIT, Apply 1 application topically at bedtime., Disp: , Rfl:    co-enzyme Q-10 30 MG capsule, Take 30 mg by mouth every evening., Disp: , Rfl:    fish oil-omega-3 fatty acids 1000 MG capsule, Take 1 g by mouth daily., Disp: , Rfl:    fluticasone (FLONASE) 50 MCG/ACT nasal spray, USE 2 SPRAYS IN EACH NOSTRIL EVERY NIGHT, Disp: 48 mL, Rfl: 6   hydrochlorothiazide (HYDRODIURIL) 25 MG tablet, Take 25 mg by mouth daily., Disp: , Rfl:    hydrocortisone (ANUSOL-HC) 2.5 % rectal cream, PLACE RECTALLY AT BEDTIME AS NEEDED FOR HEMORRHOIDS., Disp: 30 g, Rfl: 1   hydrocortisone 2.5 % cream, Apply 1 application topically 2 (two) times daily as needed., Disp: , Rfl:    Hypertonic Nasal Wash (SINUS RINSE BOTTLE KIT) PACK, Place into the nose at bedtime., Disp: , Rfl:    metoprolol tartrate (LOPRESSOR) 100 MG tablet, TAKE 1 TABLET BY MOUTH TWICE A DAY, Disp: 180 tablet, Rfl: 3   Multiple Vitamin (MULTIVITAMIN) tablet, Take 1 tablet by mouth daily., Disp: , Rfl:    oxymetazoline (AFRIN) 0.05 % nasal spray, Place 2 sprays into the nose 2 (two) times daily., Disp: , Rfl:    pantoprazole (PROTONIX) 40  MG tablet, TAKE 1 TABLET BY MOUTH EVERY DAY, Disp: 90 tablet, Rfl: 3   ramipril (ALTACE) 10 MG capsule, TAKE 1 CAPSULE (10 MG TOTAL) BY MOUTH 2 (TWO) TIMES DAILY, Disp: 180 capsule, Rfl: 3   rivaroxaban (XARELTO) 20 MG TABS tablet, Take 20 mg by mouth daily., Disp: , Rfl:    rosuvastatin (CRESTOR) 20 MG tablet, Take 20 mg by mouth daily., Disp: , Rfl:    sertraline (ZOLOFT) 100 MG tablet, Take 100 mg by mouth daily., Disp: , Rfl:    traMADol (ULTRAM) 50 MG tablet, Take 1-2 tablets by mouth as needed., Disp: , Rfl:    Wheat Dextrin (BENEFIBER PO), Take 1 Dose by mouth 2 (two) times daily., Disp: , Rfl:   PAST MEDICAL HISTORY: Past Medical History:  Diagnosis Date   Antiphospholipid antibody syndrome ([place]) 05/22/2012   Found subsequent  to recurrent PE 9/13   Arthritis    Bladder cancer ([place]) dx'd 2009   surg only   Chronic anal fissure    w/ recurrence's   Chronic anxiety    Depression    ED (erectile dysfunction)    Esophageal candidiasis ([place]) 03/14/2017   Fatty liver    GERD (gastroesophageal reflux disease)    Heart murmur    Hepatic steatosis    Hiatal hernia    History of adenomatous polyp of colon    2005   History of bladder cancer urologist-  dr Risa Grill   papillary TCC  s/p  turbt   History of DVT (deep vein thrombosis)    post prostate surgery 2010  right LLE   History of esophageal stricture    w/ dilation   History of gastric polyp    benign 2010   History of prostate cancer urologist- dr grapey/  oncology-  dr Alen Blew   Stage  T1c , Gleason 3+3;   s/p  prostatectomy 03-08-210//  per pt currently PSA nondetectable    History of pulmonary embolus (PE)    post prostate surgery 2010  and 2013 x2 right side per CT scan (found to have lupus anticoaglated )   History of TIA (transient ischemic attack)    07/ 2012 noted on scans remote probable tia's in  age 46's   History of TMJ syndrome    HTN (hypertension)    Hyperlipidemia    Lupus anticoagulant positive    found  2013 when pt had PE   Moderate aortic regurgitation    with no  stenosis    OSA on CPAP    per study severe osa 12-14-2010   Prolapsed internal hemorrhoids, grade 2 03/31/2014   prostate ca dx'd 2010   surg only   RLS (restless legs syndrome)    RLS (restless legs syndrome)    Sleep apnea    Symptomatic PVCs cardiologist-  dr berry   intermittant   Wears glasses     PAST SURGICAL HISTORY: Past Surgical History:  Procedure Laterality Date   APPENDECTOMY  age 59   and Left Inguinal Hernia Repair   CARDIOVASCULAR STRESS TEST  06-30-2011   dr berry   normal nuclear study/  normal LV function and wall motion, ef 56%   COLONOSCOPY  last one 10-19-2011   CYSTO/ BLADDER BX/  RETROGRADE PYELOGRAM/  TRANSRECTAL PROSTATE BX'S  07-30-2008   ESOPHAGOGASTRODUODENOSCOPY  last one 06-27-2011   EVALUATION UNDER ANESTHESIA WITH FISTULECTOMY N/A 01/14/2016   Procedure: EXAM UNDER ANESTHESIA WITH REPAIR OF PERIRECTAL FISTULA  (LIFT);  Surgeon: Michael Boston, MD;  Location: [facility];  Service: General;  Laterality: N/A;   LAPAROSCOPIC CHOLECYSTECTOMY  05-25-2007   and Excision cyst back of neck   ROBOT ASSISTED LAPAROSCOPIC RADICAL PROSTATECTOMY  10-20-2008   STRABISMUS SURGERY Bilateral age 82   TONSILLECTOMY  age 9   TRANSTHORACIC ECHOCARDIOGRAM  03-23-2015   dr berry   grade 1 diastolic dysfunction, ef 26-41%/  mild AV thickened leaflets with no stenosis,  moderate AR/  mild ascending aorta ilatation3.9cm/  trivial MR and TR   UPPER GASTROINTESTINAL ENDOSCOPY      FAMILY HISTORY: Family History  Problem Relation Age of Onset   Hypertension Mother    Hypertension Father    Stroke Father    Colon polyps Father    Colon cancer Father        questionable   Colon polyps Brother    Crohn's  disease Brother    Diabetes Maternal Grandmother    Heart disease Maternal Grandmother    Cirrhosis Other        Great Uncle   Esophageal cancer Neg Hx    Rectal cancer Neg Hx     Stomach cancer Neg Hx     SOCIAL HISTORY:  Social History   Socioeconomic History   Marital status: Married    Spouse name: cathy   Number of children: 2   Years of education: Not on file   Highest education level: Not on file  Occupational History   Occupation: Art gallery manager   Occupation: [employer]    Employer: [employer]  Tobacco Use   Smoking status: Some Days    Types: Cigars   Smokeless tobacco: Never   Tobacco comments:    5 cigars /week--/  smoked cigarettes for 10 years quit 1990's  Vaping Use   Vaping Use: Never used  Substance and Sexual Activity   Alcohol use: Yes    Alcohol/week: 28.0 standard drinks    Types: 14 Glasses of wine, 14 Cans of beer per week    Comment: 2 beers a day   Drug use: No   Sexual activity: Not on file  Other Topics Concern   Not on file  Social History Narrative   Married to [place])   Lives with wife   Gets regular exercise.   Daily caffeine- 2 cups daily.   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   General: The [name] is well-developed and well-nourished and in no acute distress.  Pharynx is Mallampati 2  HEENT:  Head is [place]/AT.  Sclera are anicteric.    Neck: No carotid bruits are noted.  The neck is nontender.  Cardiovascular: The heart has a regular rate and rhythm with a normal S1 and S2. There were no murmurs, gallops or rubs.     Skin: Extremities are without rash or  edema.  Musculoskeletal:  Back is nontender  Neurologic Exam  Mental status: The [name] is alert and oriented x 3 at the time of the examination. The [name] has apparent normal recent and remote memory, with an apparently normal attention span and concentration ability.   Speech is normal.  Cranial nerves: Extraocular movements are full.   Facial symmetry is present. There is good facial sensation to soft touch bilaterally.Facial strength is normal.  Trapezius and sternocleidomastoid strength is normal. No dysarthria is noted.  The tongue is midline, and the [name] has symmetric elevation of the soft palate. No obvious hearing deficits are noted.  Motor:  Muscle bulk is normal.   Tone is normal. Strength is  5 / 5 in all 4 extremities.   Sensory: Sensory testing is intact to pinprick, soft touch and vibration sensation in arms but slight  reduced vibraiotn in left leg vs right.   Coordination: Cerebellar testing reveals good finger-nose-finger and heel-to-shin bilaterally.  Gait and station: Station is normal.   Gait is normal. Tandem gait is normal. Romberg is negative.   Reflexes: Deep tendon reflexes are symmetric and normal in rms and trace at knees.   Plantar responses are flexor.    DIAGNOSTIC DATA (LABS, IMAGING, TESTING) - I reviewed [name] records, labs, notes, testing and imaging myself where available.  Lab Results  Component Value Date   WBC 4.7 07/24/2020   HGB 14.1 07/24/2020   HCT 40.6 07/24/2020   MCV 89.4 07/24/2020   PLT 152 07/24/2020      Component Value Date/Time   NA 141 07/24/2020 0838   NA 142 04/18/2018 1324   NA 140 07/14/2017 0817   K 3.6 07/24/2020 0838   K 3.8 07/14/2017 0817   CL 101 07/24/2020 0838   CL 103 11/02/2012 0737   CO2 33 (H) 07/24/2020 0838   CO2 28 07/14/2017 0817   GLUCOSE 88 07/24/2020 0838   GLUCOSE 104 07/14/2017 0817   GLUCOSE 95 11/02/2012 0737   BUN 13 07/24/2020 0838   BUN  12 04/18/2018 1324   BUN 16.8 07/14/2017 0817   CREATININE 1.07 07/24/2020 0838   CREATININE 1.1 07/14/2017 0817   CALCIUM 9.4 07/24/2020 0838   CALCIUM 9.4 07/14/2017 0817   PROT 6.7 07/24/2020 0838   PROT 7.1 07/14/2017 0817   ALBUMIN 4.0 07/24/2020 0838   ALBUMIN 4.1 07/14/2017 0817   AST 28 07/24/2020 0838   AST 35 (H) 07/14/2017 0817   ALT 40 07/24/2020 0838   ALT 57 (H) 07/14/2017 0817   ALKPHOS 58 07/24/2020 0838   ALKPHOS 67 07/14/2017 0817   BILITOT 0.9 07/24/2020 0838   BILITOT 0.60 07/14/2017 0817   GFRNONAA >60 07/24/2020 0838   GFRAA >60 07/26/2019 0818   Lab Results  Component Value Date   CHOL 147 04/05/2017   HDL 37 (L) 04/05/2017   LDLCALC 83 04/05/2017   TRIG 134 04/05/2017   CHOLHDL 4.0 04/05/2017   Lab Results  Component Value Date   HGBA1C 5.7 (H) 03/05/2011   Lab Results  Component Value Date   VITAMINB12 1030 (H) 12/12/2008   Lab Results  Component Value Date   TSH 0.764 03/30/2011       ASSESSMENT AND PLAN  OSA on CPAP - Plan: Cpap titration, Multiple sleep latency test  Cataplexy - Plan: Cpap titration, Multiple sleep latency test  Hypersomnia - Plan: Cpap titration, Multiple sleep latency test  Other cervical disc degeneration, unspecified cervical region  In summary, [name] is a 75 year old man who has spells of dizziness about once a week.  These are short and often occur after movements.  Therefore, he may have mild benign positional vertigo.  Symptoms are so mild I would not recommend any special treatment at this time.  He gets episodes of weakness in his whole body that is sometimes triggered with strong emotion.  His description is consistent with cataplexy.  Additionally, he has hypnagogic hallucinations and excessive daytime sleepiness.  Therefore, he could have narcolepsy.  He has had the symptoms for many years.  We will set up a PSG followed by MSLT to determine if he has narcolepsy.  If he does, I would consider  nortriptyline or protriptyline to help the cataplexy and a stimulant to help the sleepiness.  He will return to see me in 2 months or sooner if there are new  or worsening neurologic symptoms.  Thank you for asking me to see [name] .  Please let me know if I can be of further assistance with him or other patients in the future.  Yong Wahlquist A. Felecia Shelling, MD, [facility] 10/21/8826, 00:34 PM Certified in Neurology, Clinical Neurophysiology, Sleep Medicine and Neuroimaging  [facility] [address] [place], [place] [address] [phone]

## 2021-03-24 DIAGNOSIS — Z8551 Personal history of malignant neoplasm of bladder: Secondary | ICD-10-CM | POA: Diagnosis not present

## 2021-04-01 ENCOUNTER — Telehealth: Payer: Self-pay | Admitting: *Deleted

## 2021-04-01 ENCOUNTER — Telehealth: Payer: Self-pay

## 2021-04-01 ENCOUNTER — Other Ambulatory Visit: Payer: Self-pay | Admitting: Neurology

## 2021-04-01 MED ORDER — MODAFINIL 200 MG PO TABS: 200.0000 mg | ORAL_TABLET | Freq: Every day | ORAL | 5 refills | Status: DC

## 2021-04-01 NOTE — Telephone Encounter (Signed)
Request Reference Number: [id number]. MODAFINIL TAB '200MG'$  is approved through 10/02/2021. Your patient may now fill this prescription and it will be covered.

## 2021-04-01 NOTE — Telephone Encounter (Signed)
Called patient to schedule PAP/MSLT sleep studies. Pt stated he is not able to come off of the meds for the MSLT study. He is not interested in this. Please advise patient of his next steps.

## 2021-04-01 NOTE — Telephone Encounter (Signed)
Called pt. Relayed Dr. Garth Bigness message. He is agreeable to plan. Advised rx sent in. May require PA via insurance which we will work on. Can take a few days to reach determination via insurance once submitted. He verbalized understanding.

## 2021-04-01 NOTE — Telephone Encounter (Signed)
Submitted PA modafinil on CMM. Key: [id number]. Waiting on determination from [employer] Part D .

## 2021-04-14 DIAGNOSIS — E7800 Pure hypercholesterolemia, unspecified: Secondary | ICD-10-CM | POA: Diagnosis not present

## 2021-04-14 DIAGNOSIS — R7303 Prediabetes: Secondary | ICD-10-CM | POA: Diagnosis not present

## 2021-04-14 DIAGNOSIS — I10 Essential (primary) hypertension: Secondary | ICD-10-CM | POA: Diagnosis not present

## 2021-04-14 DIAGNOSIS — K219 Gastro-esophageal reflux disease without esophagitis: Secondary | ICD-10-CM | POA: Diagnosis not present

## 2021-04-15 ENCOUNTER — Other Ambulatory Visit: Payer: Self-pay | Admitting: Cardiovascular Disease

## 2021-04-21 ENCOUNTER — Ambulatory Visit (HOSPITAL_COMMUNITY): Payer: Medicare Other | Attending: Cardiovascular Disease

## 2021-04-21 DIAGNOSIS — I712 Thoracic aortic aneurysm, without rupture, unspecified: Secondary | ICD-10-CM

## 2021-04-21 DIAGNOSIS — Z86718 Personal history of other venous thrombosis and embolism: Secondary | ICD-10-CM | POA: Diagnosis not present

## 2021-04-21 DIAGNOSIS — I351 Nonrheumatic aortic (valve) insufficiency: Secondary | ICD-10-CM

## 2021-04-21 DIAGNOSIS — I10 Essential (primary) hypertension: Secondary | ICD-10-CM

## 2021-04-21 LAB — ECHOCARDIOGRAM COMPLETE
Area-P 1/2: 3.27 cm2
P 1/2 time: 526 msec
S' Lateral: 3.4 cm

## 2021-04-26 DIAGNOSIS — G4733 Obstructive sleep apnea (adult) (pediatric): Secondary | ICD-10-CM | POA: Diagnosis not present

## 2021-04-28 ENCOUNTER — Ambulatory Visit: Payer: Medicare Other | Admitting: Internal Medicine

## 2021-04-28 ENCOUNTER — Other Ambulatory Visit: Payer: Self-pay | Admitting: *Deleted

## 2021-04-28 ENCOUNTER — Encounter: Payer: Self-pay | Admitting: Internal Medicine

## 2021-04-28 VITALS — BP 124/72 | HR 64 | Ht 70.0 in | Wt 223.0 lb

## 2021-04-28 DIAGNOSIS — R1030 Lower abdominal pain, unspecified: Secondary | ICD-10-CM | POA: Diagnosis not present

## 2021-04-28 DIAGNOSIS — I712 Thoracic aortic aneurysm, without rupture, unspecified: Secondary | ICD-10-CM

## 2021-04-28 DIAGNOSIS — K760 Fatty (change of) liver, not elsewhere classified: Secondary | ICD-10-CM

## 2021-04-28 DIAGNOSIS — K641 Second degree hemorrhoids: Secondary | ICD-10-CM

## 2021-04-28 DIAGNOSIS — G8929 Other chronic pain: Secondary | ICD-10-CM

## 2021-04-28 DIAGNOSIS — E8881 Metabolic syndrome: Secondary | ICD-10-CM

## 2021-04-28 MED ORDER — LIDOCAINE-HYDROCORTISONE ACE 3-2.5 % RE KIT: 1.0000 "application " | PACK | Freq: Every day | RECTAL | 3 refills | Status: AC | PRN

## 2021-04-28 NOTE — Patient Instructions (Addendum)
If you are age 73 or older, your body mass index should be between 23-30. Your Body mass index is 32 kg/m. If this is out of the aforementioned range listed, please consider follow up with your Primary Care Provider.  If you are age 53 or younger, your body mass index should be between 19-25. Your Body mass index is 32 kg/m. If this is out of the aformentioned range listed, please consider follow up with your Primary Care Provider.   __________________________________________________________  The [facility] providers would like to encourage you to use [facility] to communicate with providers for non-urgent requests or questions.  Due to long hold times on the telephone, sending your provider a message by [facility] may be a faster and more efficient way to get a response.  Please allow 48 business hours for a response.  Please remember that this is for non-urgent requests.   Stop over using the hydrocortisone. We sent in a cream today you can use for your hemorrhoids.   Give up BREAD.   You can buy 5% Lidocaine off [place].   I appreciate the opportunity to care for you. Silvano Rusk, MD, [facility]

## 2021-04-28 NOTE — Progress Notes (Unsigned)
ct 

## 2021-04-28 NOTE — Progress Notes (Addendum)
[name] 75 y.o. [birth year] [id number]  Assessment & Plan:   Encounter Diagnoses  Name Primary?   Chronic groin pain, unspecified laterality Yes   Abdominal obesity and metabolic syndrome    NAFLD (nonalcoholic fatty liver disease)    Prolapsed internal hemorrhoids, grade 2    We once again reviewed the virtues of lower carbohydrate eating as far as improving metabolic syndrome and I again gave him the handouts with respect to his metabolic syndrome issues and fatty liver disease.  I have recommended he try to eliminate bread except for perhaps once a week.  I do not think I can help his groin pain he understands that it sounds like it some sort of neuropathic pain I do not know if it is related to his prior prostatectomy though I suppose it is possible.  He may pursue a chiropractic approach to see if that helps.  He plans to follow-up with neurology.  He asked for a prescription for AnaMantle instead of buying RectiCare which he uses for anal or rectal pain at times.  During this conversation he told me he has been using his hydrocortisone cream fairly regularly and I cautioned him to only use that as needed and since the AnaMantle is a combination steroid and lidocaine he should use that as needed and not regular hydrocortisone so we discontinued that.  He could also purchase generic 5% lidocaine cream for half the price of RectiCare.  I will see him back as needed.   I appreciate the opportunity to care for this patient. CC: Andrew Pretty, MD    Subjective:   Chief Complaint: Fatty liver groin pain  HPI [name] is worried about the hepatic steatosis on his CT coronary calcium score.  He has modified his eating somewhat but still eats bread.  No sodas reducing sugars.  Has reduced his chocolate consumption.  He continues to complain of a chronic groin pain for about 5 years.  The coccydynia is gone.  He has been to several practitioners and unfortunately this chronic  low-grade groin pain radiating into the testicles etc. still there.  He had a prostatectomy but did not have radiation therapy.  Wt Readings from Last 3 Encounters:  04/28/21 223 lb (101.2 kg)  03/23/21 222 lb 8 oz (100.9 kg)  02/03/21 220 lb (99.8 kg)    Allergies  Allergen Reactions   Aspirin Other (See Comments)    REACTION: gi upset with higher dose   Penicillins Itching   Current Meds  Medication Sig   ALPRAZolam (XANAX) 1 MG tablet Take 1 tablet by mouth 2 (two) times daily.   Cholecalciferol (VITAMIN D3) 50 MCG (2000 UT) capsule    Ciclopirox (CICLOPIROX TREATMENT) 8 % KIT Apply 1 application topically at bedtime.   co-enzyme Q-10 30 MG capsule Take 30 mg by mouth every evening.   fish oil-omega-3 fatty acids 1000 MG capsule Take 1 g by mouth daily.   fluticasone (FLONASE) 50 MCG/ACT nasal spray USE 2 SPRAYS IN EACH NOSTRIL EVERY NIGHT   hydrochlorothiazide (HYDRODIURIL) 25 MG tablet Take 25 mg by mouth daily.   hydrocortisone (ANUSOL-HC) 2.5 % rectal cream PLACE RECTALLY AT BEDTIME AS NEEDED FOR HEMORRHOIDS.   hydrocortisone 2.5 % cream Apply 1 application topically 2 (two) times daily as needed.   Hypertonic Nasal Wash (SINUS RINSE BOTTLE KIT) PACK Place into the nose at bedtime.   metoprolol tartrate (LOPRESSOR) 100 MG tablet TAKE 1 TABLET BY MOUTH TWICE A DAY   Multiple Vitamin (  MULTIVITAMIN) tablet Take 1 tablet by mouth daily.   oxymetazoline (AFRIN) 0.05 % nasal spray Place 2 sprays into the nose 2 (two) times daily.   pantoprazole (PROTONIX) 40 MG tablet TAKE 1 TABLET BY MOUTH EVERY DAY   ramipril (ALTACE) 10 MG capsule TAKE 1 CAPSULE (10 MG TOTAL) BY MOUTH 2 (TWO) TIMES DAILY   rivaroxaban (XARELTO) 20 MG TABS tablet Take 20 mg by mouth daily.   rosuvastatin (CRESTOR) 20 MG tablet Take 20 mg by mouth daily.   sertraline (ZOLOFT) 100 MG tablet Take 100 mg by mouth daily.   traMADol (ULTRAM) 50 MG tablet Take 1-2 tablets by mouth as needed.   Wheat Dextrin (BENEFIBER  PO) Take 1 Dose by mouth 2 (two) times daily.   Past Medical History:  Diagnosis Date   Antiphospholipid antibody syndrome ([place]) 05/22/2012   Found subsequent to recurrent PE 9/13   Arthritis    Bladder cancer ([place]) dx'd 2009   surg only   Chronic anal fissure    w/ recurrence's   Chronic anxiety    Depression    ED (erectile dysfunction)    Esophageal candidiasis ([place]) 03/14/2017   Fatty liver    GERD (gastroesophageal reflux disease)    Heart murmur    Hepatic steatosis    Hiatal hernia    History of adenomatous polyp of colon    2005   History of bladder cancer urologist-  dr Risa Grill   papillary TCC  s/p  turbt   History of DVT (deep vein thrombosis)    post prostate surgery 2010  right LLE   History of esophageal stricture    w/ dilation   History of gastric polyp    benign 2010   History of prostate cancer urologist- dr grapey/  oncology-  dr Alen Blew   Stage  T1c , Gleason 3+3;   s/p  prostatectomy 03-08-210//  per pt currently PSA nondetectable    History of pulmonary embolus (PE)    post prostate surgery 2010  and 2013 x2 right side per CT scan (found to have lupus anticoaglated )   History of TIA (transient ischemic attack)    07/ 2012 noted on scans remote probable tia's in  age 68's   History of TMJ syndrome    HTN (hypertension)    Hyperlipidemia    Lupus anticoagulant positive    found 2013 when pt had PE   Moderate aortic regurgitation    with no  stenosis    OSA on CPAP    per study severe osa 12-14-2010   Prolapsed internal hemorrhoids, grade 2 03/31/2014   prostate ca dx'd 2010   surg only   RLS (restless legs syndrome)    RLS (restless legs syndrome)    Sleep apnea    Symptomatic PVCs cardiologist-  dr berry   intermittant   Wears glasses    Past Surgical History:  Procedure Laterality Date   APPENDECTOMY  age 47   and Left Inguinal Hernia Repair   CARDIOVASCULAR STRESS TEST  06-30-2011   dr berry   normal nuclear study/  normal LV function  and wall motion, ef 56%   COLONOSCOPY  last one 10-19-2011   CYSTO/ BLADDER BX/  RETROGRADE PYELOGRAM/  TRANSRECTAL PROSTATE BX'S  07-30-2008   ESOPHAGOGASTRODUODENOSCOPY  last one 06-27-2011   EVALUATION UNDER ANESTHESIA WITH FISTULECTOMY N/A 01/14/2016   Procedure: EXAM UNDER ANESTHESIA WITH REPAIR OF PERIRECTAL FISTULA  (LIFT);  Surgeon: Michael Boston, MD;  Location[facility];  Service: General;  Laterality: N/A;   LAPAROSCOPIC CHOLECYSTECTOMY  05-25-2007   and Excision cyst back of neck   ROBOT ASSISTED LAPAROSCOPIC RADICAL PROSTATECTOMY  10-20-2008   STRABISMUS SURGERY Bilateral age 23   TONSILLECTOMY  age 28   TRANSTHORACIC ECHOCARDIOGRAM  03-23-2015   dr berry   grade 1 diastolic dysfunction, ef 75-43%/  mild AV thickened leaflets with no stenosis,  moderate AR/  mild ascending aorta ilatation3.9cm/  trivial MR and TR   UPPER GASTROINTESTINAL ENDOSCOPY     Social History   Social History Narrative   Married to [employer] [place])   Lives with wife   Gets regular exercise.   Daily caffeine- 2 cups daily.   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   Review of Systems As above  Objective:   Physical Exam BP 124/72   Pulse 64   Ht 5' 10"  (1.778 m)   Wt 223 lb (101.2 kg)   BMI 32.00 kg/m  No acute distress 25mnutes total time spent on this visit

## 2021-05-14 ENCOUNTER — Encounter: Payer: Self-pay | Admitting: Cardiovascular Disease

## 2021-05-14 ENCOUNTER — Ambulatory Visit: Payer: Medicare Other | Admitting: Cardiovascular Disease

## 2021-05-14 ENCOUNTER — Other Ambulatory Visit: Payer: Self-pay

## 2021-05-14 VITALS — BP 146/72 | HR 62 | Ht 70.0 in | Wt 226.2 lb

## 2021-05-14 DIAGNOSIS — E7800 Pure hypercholesterolemia, unspecified: Secondary | ICD-10-CM | POA: Diagnosis not present

## 2021-05-14 DIAGNOSIS — K219 Gastro-esophageal reflux disease without esophagitis: Secondary | ICD-10-CM | POA: Diagnosis not present

## 2021-05-14 DIAGNOSIS — I2699 Other pulmonary embolism without acute cor pulmonale: Secondary | ICD-10-CM

## 2021-05-14 DIAGNOSIS — R931 Abnormal findings on diagnostic imaging of heart and coronary circulation: Secondary | ICD-10-CM | POA: Diagnosis not present

## 2021-05-14 DIAGNOSIS — I351 Nonrheumatic aortic (valve) insufficiency: Secondary | ICD-10-CM | POA: Diagnosis not present

## 2021-05-14 DIAGNOSIS — J449 Chronic obstructive pulmonary disease, unspecified: Secondary | ICD-10-CM | POA: Diagnosis not present

## 2021-05-14 DIAGNOSIS — I712 Thoracic aortic aneurysm, without rupture, unspecified: Secondary | ICD-10-CM

## 2021-05-14 DIAGNOSIS — I10 Essential (primary) hypertension: Secondary | ICD-10-CM

## 2021-05-14 NOTE — Progress Notes (Signed)
05/14/2021 [name]   03/01/1946  [id number]  Primary Physician [name], MD Primary Cardiologist: [name] MD [name], [place]  HPI:  [name] is a 75 y.o.  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 I last saw him 12/23/2020.Marland Kitchen He has a history of remote tobacco abuse, having smoked cigars in the past, treated hypertension, dyslipidemia. He has never had a heart attack or stroke. He has had bladder and prostate cancer, and he has had prostate surgery. He is followed by Dr. Algis Greenhouse and has been evaluated by Dr. Liam Rogers in the past with a Myoview stress test that was normal and an event monitor that showed PVCs for palpitations. He does drink 2-4 cups of coffee a day and has had palpitations since he was a teenager. He saw [name] complaining of right-sided chest pain and shortness of breath. He had a CT scan done April 19, 2012 that showed 2 small subsegmental right-sided pulmonary emboli of unclear origin. There is no evidence of DVT. He was placed on Xarelto. Dr. Beryle Beams did a hypercoagulable workup which apparently, according to the patient, was positive for lupus anticoagulant. Since I saw him he has remained asymptomatic. We have been following his small thoracic aortic aneurysm which a year ago measured 4.7 cm     He had a chest CTA performed 06/05/2019 revealed his thoracic aortic aneurysm measured 4.5 cm which has been stable.  Dr. Cyndia Bent follows this.  He additionally had a 2D echo performed 05/01/2020 that showed mild to moderate aortic insufficiency, normal LV function with grade 1 diastolic dysfunction.   Dr. Cyndia Bent follows his thoracic aortic aneurysm which was recently measured by CTA 06/03/2020 at 4.5 cm, stable.  2D echo performed 05/01/2020 showed normal LV systolic function with mild to moderate AI,  unchanged.  His PCP, [name], recently performed a coronary calcium score 12/02/2020 which measured 2079.  This was equally distributed in all 3 coronary arteries.  He is completely asymptomatic.  He exercises 6 days a week riding his bicycle, walks up flights of stairs without symptoms.  His last Myoview performed 06/30/2011 was negative.  Since I saw him 5 months ago he continues to do well.  His LDL is in the 70 range on statin therapy.  He is very active and is completely asymptomatic.  His most recent 2D echo performed 04/21/2021 revealed normal LV systolic function with mild to moderate aortic insufficiency unchanged from prior echoes.     Current Meds  Medication Sig   ALPRAZolam (XANAX) 1 MG tablet Take 1 tablet by mouth 2 (two) times daily.   Cholecalciferol (VITAMIN D3) 50 MCG (2000 UT) capsule Take 2,000 Units by mouth daily.   Ciclopirox (CICLOPIROX TREATMENT) 8 % KIT Apply 1 application topically at bedtime.   co-enzyme Q-10 30 MG capsule Take 30 mg by mouth every evening.   fish oil-omega-3 fatty acids 1000 MG capsule Take 1 g by mouth daily.   fluticasone (FLONASE) 50 MCG/ACT nasal spray USE 2 SPRAYS IN EACH NOSTRIL EVERY NIGHT   hydrochlorothiazide (HYDRODIURIL) 25 MG tablet Take 25 mg by mouth daily.   hydrocortisone 2.5 % cream Apply 1 application topically 2 (two) times daily as needed.   Hypertonic Nasal Wash (SINUS RINSE BOTTLE KIT) PACK Place into the nose at bedtime.   Lidocaine-Hydrocortisone Ace 3-2.5 % KIT Place 1 application rectally  daily as needed (hemorrhoids).   metoprolol tartrate (LOPRESSOR) 100 MG tablet TAKE 1 TABLET BY MOUTH TWICE A DAY   Multiple Vitamin (MULTIVITAMIN) tablet Take 1 tablet by mouth daily.   oxymetazoline (AFRIN) 0.05 % nasal spray Place 2 sprays into the nose 2 (two) times daily.   pantoprazole (PROTONIX) 40 MG tablet TAKE 1 TABLET BY MOUTH EVERY DAY   ramipril (ALTACE) 10 MG capsule TAKE 1 CAPSULE (10 MG TOTAL) BY MOUTH 2 (TWO) TIMES DAILY    rivaroxaban (XARELTO) 20 MG TABS tablet Take 20 mg by mouth daily.   rosuvastatin (CRESTOR) 20 MG tablet Take 20 mg by mouth daily.   sertraline (ZOLOFT) 100 MG tablet Take 100 mg by mouth daily.   Wheat Dextrin (BENEFIBER PO) Take 1 Dose by mouth 2 (two) times daily.     Allergies  Allergen Reactions   Aspirin Other (See Comments)    REACTION: gi upset with higher dose   Penicillins Itching    Social History   Socioeconomic History   Marital status: Married    Spouse name: cathy   Number of children: 2   Years of education: Not on file   Highest education level: Not on file  Occupational History   Occupation: Art gallery manager   Occupation: [employer]    Employer: [employer]  Tobacco Use   Smoking status: Some Days    Types: Cigars   Smokeless tobacco: Never   Tobacco comments:    5 cigars /week--/  smoked cigarettes for 10 years quit 1990's  Vaping Use   Vaping Use: Never used  Substance and Sexual Activity   Alcohol use: Yes    Alcohol/week: 28.0 standard drinks    Types: 14 Glasses of wine, 14 Cans of beer per week    Comment: 2 beers a day   Drug use: No   Sexual activity: Not on file  Other Topics Concern   Not on file  Social History Narrative   Married to [place])   Lives with wife   Gets regular exercise.   Daily caffeine- 2 cups daily.   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  Cardiovascular: negative for chest pain, dyspnea on  exertion, edema, orthopnea, palpitations, paroxysmal nocturnal dyspnea or shortness of breath Dermatological: negative for rash Respiratory: negative for cough or wheezing Urologic: negative for hematuria Abdominal: negative for nausea, vomiting, diarrhea, bright red blood per rectum, melena, or hematemesis Neurologic: negative for visual changes, syncope, or dizziness All other systems reviewed and are otherwise negative except as noted above.    Blood pressure (!) 146/72, pulse 62, height _0  (1.778 m), weight 226 lb 3.2 oz (102.6 kg), SpO2 96 %.  General appearance: alert and no distress Neck: no adenopathy, no carotid bruit, no JVD, supple, symmetrical, trachea midline, and thyroid not enlarged, symmetric, no tenderness/mass/nodules Lungs: clear to auscultation bilaterally Heart: regular rate and rhythm, S1, S2 normal, no murmur, click, rub or  gallop Extremities: extremities normal, atraumatic, no cyanosis or edema Pulses: 2+ and symmetric Skin: Skin color, texture, turgor normal. No rashes or lesions Neurologic: Grossly normal  EKG not performed today  ASSESSMENT AND PLAN:   HYPERLIPIDEMIA History of hyperlipidemia on statin therapy with lipid profile performed 11/26/2020 revealing total cholesterol 139, LDL 73 and HDL 46.  Essential hypertension History of essential hypertension a blood pressure measured today 146/72.  He is on hydrochlorothiazide, ramipril and metoprolol.  Pulmonary embolus (HCC) History of pulm embolus and DVT in the past with work-up consistent with a thrombophilia (antiphospholipid antibody) on chronic Xarelto oral anticoagulation.  Aortic insufficiency History of mild to moderate aortic insufficiency by 2D echo recently performed 04/21/2021 with normal LV systolic function.  This has remained stable.  We will repeat 2D echocardiography on an annual basis.  Elevated coronary artery calcium score Elevated coronary calcium score of 2079.  Patient is  completely asymptomatic with an LDL at goal for secondary prevention.  Thoracic aortic aneurysm (HCC) History of thoracic aortic aneurysm measuring 4.5 cm in the past followed by Dr. Cyndia Bent on an annual basis.     [name] MD FACP,FACC,FAHA, [facility] 05/14/2021 10:57 AM

## 2021-05-14 NOTE — Assessment & Plan Note (Signed)
History of mild to moderate aortic insufficiency by 2D echo recently performed 04/21/2021 with normal LV systolic function.  This has remained stable.  We will repeat 2D echocardiography on an annual basis.

## 2021-05-14 NOTE — Assessment & Plan Note (Signed)
History of hyperlipidemia on statin therapy with lipid profile performed 11/26/2020 revealing total cholesterol 139, LDL 73 and HDL 46.

## 2021-05-14 NOTE — Assessment & Plan Note (Signed)
History of pulm embolus and DVT in the past with work-up consistent with a thrombophilia (antiphospholipid antibody) on chronic Xarelto oral anticoagulation.

## 2021-05-14 NOTE — Assessment & Plan Note (Signed)
History of thoracic aortic aneurysm measuring 4.5 cm in the past followed by Dr. Cyndia Bent on an annual basis.

## 2021-05-14 NOTE — Assessment & Plan Note (Signed)
Elevated coronary calcium score of 2079.  Patient is completely asymptomatic with an LDL at goal for secondary prevention.

## 2021-05-14 NOTE — Patient Instructions (Signed)
Medication Instructions:  Your physician recommends that you continue on your current medications as directed. Please refer to the Current Medication list given to you today.  *If you need a refill on your cardiac medications before your next appointment, please call your pharmacy*   Lab Work: Your physician recommends that you have labs drawn today: lipid and liver profile  If you have labs (blood work) drawn today and your tests are completely normal, you will receive your results only by: [place] (if you have MyChart) OR A paper copy in the mail If you have any lab test that is abnormal or we need to change your treatment, we will call you to review the results.   Testing/Procedures: Your physician has requested that you have an echocardiogram. Echocardiography is a painless test that uses sound waves to create images of your heart. It provides your doctor with information about the size and shape of your heart and how well your heart's chambers and valves are working. This procedure takes approximately one hour. There are no restrictions for this procedure. To be done in Sept 2023. This procedure is done at [address].    Follow-Up: At [facility], you and your health needs are our priority.  As part of our continuing mission to provide you with exceptional heart care, we have created designated Provider Care Teams.  These Care Teams include your primary Cardiologist (physician) and Advanced Practice Providers (APPs -  Physician Assistants and Nurse Practitioners) who all work together to provide you with the care you need, when you need it.  We recommend signing up for the patient portal called "MyChart".  Sign up information is provided on this After Visit Summary.  MyChart is used to connect with patients for Virtual Visits (Telemedicine).  Patients are able to view lab/test results, encounter notes, upcoming appointments, etc.  Non-urgent messages can be sent to your  provider as well.   To learn more about what you can do with MyChart, go to [email or web address].    Your next appointment:   12 month(s)  The format for your next appointment:   In Person  Provider:   Quay Burow, MD

## 2021-05-14 NOTE — Assessment & Plan Note (Signed)
History of essential hypertension a blood pressure measured today 146/72.  He is on hydrochlorothiazide, ramipril and metoprolol.

## 2021-05-15 LAB — HEPATIC FUNCTION PANEL
ALT: 43 IU/L (ref 0–44)
AST: 32 IU/L (ref 0–40)
Albumin: 4.7 g/dL (ref 3.7–4.7)
Alkaline Phosphatase: 67 IU/L (ref 44–121)
Bilirubin Total: 0.7 mg/dL (ref 0.0–1.2)
Bilirubin, Direct: 0.23 mg/dL (ref 0.00–0.40)
Total Protein: 7 g/dL (ref 6.0–8.5)

## 2021-05-15 LAB — LIPID PANEL
Chol/HDL Ratio: 3.6 ratio (ref 0.0–5.0)
Cholesterol, Total: 137 mg/dL (ref 100–199)
HDL: 38 mg/dL — ABNORMAL LOW (ref >39)
LDL Chol Calc (NIH): 76 mg/dL (ref 0–99)
Triglycerides: 131 mg/dL (ref 0–149)
VLDL Cholesterol Cal: 23 mg/dL (ref 5–40)

## 2021-05-17 ENCOUNTER — Other Ambulatory Visit: Payer: Self-pay | Admitting: Internal Medicine

## 2021-05-20 ENCOUNTER — Ambulatory Visit: Payer: Medicare Other | Admitting: Neurology

## 2021-05-20 ENCOUNTER — Encounter: Payer: Self-pay | Admitting: Neurology

## 2021-05-20 ENCOUNTER — Telehealth: Payer: Self-pay | Admitting: Neurology

## 2021-05-20 VITALS — BP 133/73 | HR 63 | Ht 70.0 in | Wt 222.5 lb

## 2021-05-20 DIAGNOSIS — R200 Anesthesia of skin: Secondary | ICD-10-CM | POA: Diagnosis not present

## 2021-05-20 DIAGNOSIS — Z9989 Dependence on other enabling machines and devices: Secondary | ICD-10-CM

## 2021-05-20 DIAGNOSIS — R269 Unspecified abnormalities of gait and mobility: Secondary | ICD-10-CM | POA: Diagnosis not present

## 2021-05-20 DIAGNOSIS — R42 Dizziness and giddiness: Secondary | ICD-10-CM

## 2021-05-20 DIAGNOSIS — G4733 Obstructive sleep apnea (adult) (pediatric): Secondary | ICD-10-CM | POA: Diagnosis not present

## 2021-05-20 DIAGNOSIS — G4710 Hypersomnia, unspecified: Secondary | ICD-10-CM

## 2021-05-20 DIAGNOSIS — R531 Weakness: Secondary | ICD-10-CM | POA: Diagnosis not present

## 2021-05-20 DIAGNOSIS — M5030 Other cervical disc degeneration, unspecified cervical region: Secondary | ICD-10-CM

## 2021-05-20 NOTE — Telephone Encounter (Signed)
[employer] medicare order sent to [facility], [facility] they will reach out to the patient to schedule.  

## 2021-05-20 NOTE — Progress Notes (Signed)
[facility]  PATIENT: [name] DOB: 08-Oct-1945  REFERRING DOCTOR OR PCP: [name] SOURCE: Patient, notes from primary care, imaging and lab results  _________________________________   HISTORICAL  CHIEF COMPLAINT:  Chief Complaint  Patient presents with   Follow-up    RM 1, alone. Pt reports mild headaches. Gets mild pains on scalp that radiates down back of neck. Eyes feels gritty/sensitive. Pt states this has been going on over 1 yr. Gets dizzy spells intermittently (this is a chronic issue). Used to follow w/ Dr. Gaynell Ford who did MRI/MRA and told him he had occluded arteries in brain and never follow up. Has been on crestor for last 12 yr. Has aortic aneurysm.     HISTORY OF PRESENT ILLNESS:  [name] is a 75 y.o. man with OSA on CPAP  Update 05/20/2021 He has OSA and is on CPAP with good efficacy and compliance.   At the last visit, he described spells c/w cataplexy.  He has vivid dreams but no sleep paralysis.  He has mild EDS.   He never followed through with the PSG/MSLT.   He has mild EDS.     SInce the last visit he stopped Zyrtec and is sleeping better.  He gets dizzy spells.  He does not get rotational vertigo but fels like he is pushed one way or the other.  .   Severe episodes lasting 20 seconds occur about 5-6/year.   He feels weak afterwards.  He does not feel he will pass out.    In 2012, he had an MRA showing dolichoectasia but no intracranial stenosis.    He notes balance is reduced.   One fall led to a rib fracture.  Sometimes the left side feels a little numb.  (Ford > arm/leg)  He has dropped some items.   He has constipation but not bladder issues.   He has soe tingling in hands and feet.       He has several vascular risks:  Hypertension, hyperlipidemia, smoking.   No DM.   He also has coronary artery disease (high calcium score) but has no angina.    Stress testes were fine in the past.    CT has also shown a mild  thoracic aortic aneurysm (4.5) that is followed with serial scanning.     EPWORTH SLEEPINESS SCALE  On a scale of 0 - 3 what is the chance of dozing:  Sitting and Reading:   3 Watching TV:    3 Sitting inactive in a public place: 1 Passenger in car for one hour: 3 Lying down to rest in the afternoon: 3 Sitting and talking to someone: 0 Sitting quietly after lunch:  0 In a car, stopped in traffic:  0  Total (out of 24):   13/24   mild excessive sleepiness.       Images reviewed: MRI of the head and MR angiogram 03/06/2011 showed T2/flair hyperintense foci, many in the periventricular white matter, some radially oriented to the ventricles.  There was mild age-appropriate generalized cortical atrophy.  Dolichoectasia of the vertebrobasilar and carotid arteries near the skull base were noted.   REVIEW OF SYSTEMS: Constitutional: No fevers, chills, sweats, or change in appetite Eyes: No visual changes, double vision, eye pain Ear, nose and throat: No hearing loss, ear pain, nasal congestion, sore throat Cardiovascular: No chest pain, palpitations Respiratory:  No shortness of breath at rest or with exertion.   No wheezes GastrointestinaI: No nausea, vomiting, diarrhea,  abdominal pain, fecal incontinence Genitourinary:  No dysuria, urinary retention or frequency.  No nocturia. Musculoskeletal:  No neck pain, back pain Integumentary: No rash, pruritus, skin lesions Neurological: as above Psychiatric: No depression at this time.  No anxiety Endocrine: No palpitations, diaphoresis, change in appetite, change in weigh or increased thirst Hematologic/Lymphatic:  No anemia, purpura, petechiae. Allergic/Immunologic: No itchy/runny eyes, nasal congestion, recent allergic reactions, rashes  ALLERGIES: Allergies  Allergen Reactions   Aspirin Other (See Comments)    REACTION: gi upset with higher dose   Penicillins Itching    HOME MEDICATIONS:  Current Outpatient Medications:     ALPRAZolam (XANAX) 1 MG tablet, Take 1 tablet by mouth 2 (two) times daily., Disp: , Rfl:    Cholecalciferol (VITAMIN D3) 50 MCG (2000 UT) capsule, Take 2,000 Units by mouth daily., Disp: , Rfl:    Ciclopirox (CICLOPIROX TREATMENT) 8 % KIT, Apply 1 application topically at bedtime., Disp: , Rfl:    co-enzyme Q-10 30 MG capsule, Take 30 mg by mouth every evening., Disp: , Rfl:    fish oil-omega-3 fatty acids 1000 MG capsule, Take 1 g by mouth daily., Disp: , Rfl:    fluticasone (FLONASE) 50 MCG/ACT nasal spray, USE 2 SPRAYS IN EACH NOSTRIL EVERY NIGHT, Disp: 48 mL, Rfl: 6   hydrochlorothiazide (HYDRODIURIL) 25 MG tablet, Take 25 mg by mouth daily., Disp: , Rfl:    hydrocortisone 2.5 % cream, Apply 1 application topically 2 (two) times daily as needed., Disp: , Rfl:    Hypertonic Nasal Wash (SINUS RINSE BOTTLE KIT) PACK, Place into the nose at bedtime., Disp: , Rfl:    Lidocaine-Hydrocortisone Ace 3-2.5 % KIT, Place 1 application rectally daily as needed (hemorrhoids)., Disp: 1 kit, Rfl: 3   metoprolol tartrate (LOPRESSOR) 100 MG tablet, TAKE 1 TABLET BY MOUTH TWICE A DAY, Disp: 180 tablet, Rfl: 2   Multiple Vitamin (MULTIVITAMIN) tablet, Take 1 tablet by mouth daily., Disp: , Rfl:    oxymetazoline (AFRIN) 0.05 % nasal spray, Place 2 sprays into the nose 2 (two) times daily., Disp: , Rfl:    pantoprazole (PROTONIX) 40 MG tablet, TAKE 1 TABLET BY MOUTH EVERY DAY, Disp: 90 tablet, Rfl: 3   ramipril (ALTACE) 10 MG capsule, TAKE 1 CAPSULE (10 MG TOTAL) BY MOUTH 2 (TWO) TIMES DAILY, Disp: 180 capsule, Rfl: 3   rivaroxaban (XARELTO) 20 MG TABS tablet, Take 20 mg by mouth daily., Disp: , Rfl:    rosuvastatin (CRESTOR) 20 MG tablet, Take 20 mg by mouth daily., Disp: , Rfl:    sertraline (ZOLOFT) 100 MG tablet, Take 100 mg by mouth daily., Disp: , Rfl:    traMADol (ULTRAM) 50 MG tablet, Take 1-2 tablets by mouth as needed., Disp: , Rfl:    Wheat Dextrin (BENEFIBER PO), Take 1 Dose by mouth 2 (two) times  daily., Disp: , Rfl:   PAST MEDICAL HISTORY: Past Medical History:  Diagnosis Date   Antiphospholipid antibody syndrome ([place]) 05/22/2012   Found subsequent to recurrent PE 9/13   Arthritis    Bladder cancer ([place]) dx'd 2009   surg only   Chronic anal fissure    w/ recurrence's   Chronic anxiety    Depression    ED (erectile dysfunction)    Esophageal candidiasis ([place]) 03/14/2017   Fatty liver    GERD (gastroesophageal reflux disease)    Heart murmur    Hepatic steatosis    Hiatal hernia    History of adenomatous polyp of colon    2005  History of bladder cancer urologist-  dr Risa Grill   papillary TCC  s/p  turbt   History of DVT (deep vein thrombosis)    post prostate surgery 2010  right LLE   History of esophageal stricture    w/ dilation   History of gastric polyp    benign 2010   History of prostate cancer urologist- dr grapey/  oncology-  dr Alen Blew   Stage  T1c , Gleason 3+3;   s/p  prostatectomy 03-08-210//  per pt currently PSA nondetectable    History of pulmonary embolus (PE)    post prostate surgery 2010  and 2013 x2 right side per CT scan (found to have lupus anticoaglated )   History of TIA (transient ischemic attack)    07/ 2012 noted on scans remote probable tia's in  age 78's   History of TMJ syndrome    HTN (hypertension)    Hyperlipidemia    Lupus anticoagulant positive    found 2013 when pt had PE   Moderate aortic regurgitation    with no  stenosis    OSA on CPAP    per study severe osa 12-14-2010   Prolapsed internal hemorrhoids, grade 2 03/31/2014   prostate ca dx'd 2010   surg only   RLS (restless legs syndrome)    RLS (restless legs syndrome)    Sleep apnea    Symptomatic PVCs cardiologist-  dr berry   intermittant   Wears glasses     PAST SURGICAL HISTORY: Past Surgical History:  Procedure Laterality Date   APPENDECTOMY  age 35   and Left Inguinal Hernia Repair   CARDIOVASCULAR STRESS TEST  06-30-2011   dr berry   normal nuclear  study/  normal LV function and wall motion, ef 56%   COLONOSCOPY  last one 10-19-2011   CYSTO/ BLADDER BX/  RETROGRADE PYELOGRAM/  TRANSRECTAL PROSTATE BX'S  07-30-2008   ESOPHAGOGASTRODUODENOSCOPY  last one 06-27-2011   EVALUATION UNDER ANESTHESIA WITH FISTULECTOMY N/A 01/14/2016   Procedure: EXAM UNDER ANESTHESIA WITH REPAIR OF PERIRECTAL FISTULA  (LIFT);  Surgeon: Michael Boston, Ford;  Location: [facility];  Service: General;  Laterality: N/A;   LAPAROSCOPIC CHOLECYSTECTOMY  05-25-2007   and Excision cyst back of neck   ROBOT ASSISTED LAPAROSCOPIC RADICAL PROSTATECTOMY  10-20-2008   STRABISMUS SURGERY Bilateral age 76   TONSILLECTOMY  age 56   TRANSTHORACIC ECHOCARDIOGRAM  03-23-2015   dr berry   grade 1 diastolic dysfunction, ef 54-62%/  mild AV thickened leaflets with no stenosis,  moderate AR/  mild ascending aorta ilatation3.9cm/  trivial MR and TR   UPPER GASTROINTESTINAL ENDOSCOPY      FAMILY HISTORY: Family History  Problem Relation Age of Onset   Hypertension Mother    Hypertension Father    Stroke Father    Colon polyps Father    Colon cancer Father        questionable   Colon polyps Brother    Crohn's disease Brother    Diabetes Maternal Grandmother    Heart disease Maternal Grandmother    Cirrhosis Other        Great Uncle   Esophageal cancer Neg Hx    Rectal cancer Neg Hx    Stomach cancer Neg Hx     SOCIAL HISTORY:  Social History   Socioeconomic History   Marital status: Married    Spouse name: cathy   Number of children: 2   Years of education: Not on file   Highest education  level: Not on file  Occupational History   Occupation: Art gallery manager   Occupation: [employer]    Employer: [employer]  Tobacco Use   Smoking status: Some Days    Types: Cigars   Smokeless tobacco: Never   Tobacco comments:    5 cigars /week--/  smoked cigarettes for 10 years quit 1990's  Vaping Use   Vaping Use: Never used  Substance and Sexual  Activity   Alcohol use: Yes    Alcohol/week: 28.0 standard drinks    Types: 14 Glasses of wine, 14 Cans of beer per week    Comment: 2 beers a day   Drug use: No   Sexual activity: Not on file  Other Topics Concern   Not on file  Social History Narrative   Married to [place])   Lives with wife   Gets regular exercise.   Daily caffeine- 2 cups daily.   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   General: The patient is well-developed and well-nourished and in no acute distress.  Pharynx is Mallampati 2  HEENT:  Head is [place]/AT.  Sclera are anicteric.    Skin: Extremities are without rash or  edema.  Musculoskeletal:  Back is nontender  Neurologic Exam  Mental status: The patient is alert and oriented x 3 at the time of the examination. The patient has apparent normal recent and remote memory, with an apparently normal attention span and concentration ability.   Speech is normal.  Cranial nerves: Extraocular movements show right exotropia (old).   Colors desaturated OS.  Facial symmetry is present.Reduced left Ford  sensation to soft touch.  Facial strength is normal.  Trapezius and sternocleidomastoid strength is normal. No dysarthria is noted.  Mild reduced left hearing and Weber lateralizes to the right.   Motor:  Muscle bulk is normal.   Tone is normal. Strength is  5 / 5 in all 4 extremities.    Sensory: Sensory testing shpws reduced touch and vib on the left arm/leg  Coordination: Cerebellar testing reveals good finger-nose-finger and heel-to-shin bilaterally.  Gait and station: Station is normal.   Gait is mildly wide and turn is wide.  He is unable to do tandem gait.  Romberg is borderline.  Reflexes: Deep tendon reflexes are symmetric and normal in rms and trace at knees.   Plantar responses are flexor.    DIAGNOSTIC DATA (LABS, IMAGING, TESTING) - I reviewed patient records, labs, notes, testing and imaging myself where available.  Lab Results  Component Value Date   WBC 4.7 07/24/2020   HGB 14.1 07/24/2020   HCT 40.6 07/24/2020   MCV 89.4 07/24/2020   PLT 152 07/24/2020      Component Value Date/Time   NA 141 07/24/2020 0838   NA 142 04/18/2018 1324  NA 140 07/14/2017 0817   K 3.6 07/24/2020 0838   K 3.8 07/14/2017 0817   CL 101 07/24/2020 0838   CL 103 11/02/2012 0737   CO2 33 (H) 07/24/2020 0838   CO2 28 07/14/2017 0817   GLUCOSE 88 07/24/2020 0838   GLUCOSE 104 07/14/2017 0817   GLUCOSE 95 11/02/2012 0737   BUN 13 07/24/2020 0838   BUN 12 04/18/2018 1324   BUN 16.8 07/14/2017 0817   CREATININE 1.07 07/24/2020 0838   CREATININE 1.1 07/14/2017 0817   CALCIUM 9.4 07/24/2020 0838   CALCIUM 9.4 07/14/2017 0817   PROT 7.0 05/14/2021 1106   PROT 7.1 07/14/2017 0817   ALBUMIN 4.7 05/14/2021 1106   ALBUMIN 4.1 07/14/2017 0817   AST 32 05/14/2021 1106   AST 28 07/24/2020 0838   AST 35 (H) 07/14/2017 0817   ALT 43 05/14/2021 1106   ALT 40 07/24/2020 0838   ALT 57 (H) 07/14/2017 0817   ALKPHOS 67 05/14/2021 1106   ALKPHOS 67 07/14/2017 0817   BILITOT 0.7 05/14/2021 1106   BILITOT 0.9 07/24/2020 0838   BILITOT 0.60 07/14/2017 0817   GFRNONAA >60 07/24/2020 0838   GFRAA >60 07/26/2019 0818   Lab Results  Component Value Date   CHOL 137 05/14/2021   HDL 38 (L) 05/14/2021   LDLCALC 76 05/14/2021   TRIG 131 05/14/2021   CHOLHDL 3.6 05/14/2021    Lab Results  Component Value Date   HGBA1C 5.7 (H) 03/05/2011   Lab Results  Component Value Date   VITAMINB12 1030 (H) 12/12/2008   Lab Results  Component Value Date   TSH 0.764 03/30/2011       ASSESSMENT AND PLAN  Gait disturbance - Plan: MR BRAIN WO CONTRAST, MR CERVICAL SPINE WO CONTRAST  Left sided numbness - Plan: MR BRAIN WO CONTRAST, MR CERVICAL SPINE WO CONTRAST  Dizzinesses  OSA on CPAP  Hypersomnia  Other cervical disc degeneration, unspecified cervical region  Episodic weakness  We will check an MRI of the brain and cervical spine to further evaluate his gait and balance disorder and left-sided numbness.  Otherwise his age the changes on the brain MRI are probably due to chronic microvascular ischemic change, the pattern could also be seen with demyelination as he has periventricular foci that are radially oriented to the ventricles.  Additionally the MRI will help [place] rule out compressive myelopathy. Stay active and exercise as tolerated. Continue CPAP for OSA.  He has not had as much daytime sleepiness as he did earlier in the year and would like to hold off on the MSLT testing He will return to see [place] in 6 months or sooner if there are new or worsening neurologic symptoms or based on the results of the studies.  Izella Ybanez A. Felecia Shelling, Ford, [facility] 38/04/3733, 2:87 PM Certified in Neurology, Clinical Neurophysiology, Sleep Medicine and Neuroimaging  [facility] [address], [place], [place] [address] [phone]

## 2021-06-02 ENCOUNTER — Ambulatory Visit
Admission: RE | Admit: 2021-06-02 | Discharge: 2021-06-02 | Disposition: A | Discharge status: Home / Self Care | Payer: Medicare Other | Source: Ambulatory Visit | Attending: Neurology | Admitting: Neurology

## 2021-06-02 ENCOUNTER — Ambulatory Visit
Admission: RE | Admit: 2021-06-02 | Discharge: 2021-06-02 | Disposition: A | Discharge status: Home / Self Care | Payer: Medicare Other | Source: Ambulatory Visit | Attending: Surgery | Admitting: Surgery

## 2021-06-02 ENCOUNTER — Encounter: Payer: Self-pay | Admitting: Surgery

## 2021-06-02 ENCOUNTER — Other Ambulatory Visit: Payer: Self-pay

## 2021-06-02 ENCOUNTER — Ambulatory Visit (INDEPENDENT_AMBULATORY_CARE_PROVIDER_SITE_OTHER): Payer: Medicare Other | Admitting: Surgery

## 2021-06-02 VITALS — BP 158/66 | HR 65 | Resp 20 | Ht 70.0 in | Wt 226.0 lb

## 2021-06-02 DIAGNOSIS — R200 Anesthesia of skin: Secondary | ICD-10-CM

## 2021-06-02 DIAGNOSIS — R269 Unspecified abnormalities of gait and mobility: Secondary | ICD-10-CM | POA: Diagnosis not present

## 2021-06-02 DIAGNOSIS — I712 Thoracic aortic aneurysm, without rupture, unspecified: Secondary | ICD-10-CM

## 2021-06-02 MED ORDER — IOPAMIDOL (ISOVUE-370) INJECTION 76%
75.0000 mL | Freq: Once | INTRAVENOUS | Status: AC | PRN
  Administered 2021-06-02: 75 mL via INTRAVENOUS

## 2021-06-02 NOTE — Progress Notes (Signed)
HPI:  The patient is a 75 year old gentleman who returns for follow-up of a stable 4.5 cm fusiform ascending aortic aneurysm.  He continues to feel well without chest pain or shortness of breath. He has a brother who has an ascending aortic aneurysm and his mother also had an ascending aortic aneurysm of unknown size but died in her 60s unrelated to the aneurysm.  His son was diagnosed with multiple sclerosis and is undergoing treatment for that.  Current Outpatient Medications  Medication Sig Dispense Refill   ALPRAZolam (XANAX) 1 MG tablet Take 1 tablet by mouth 2 (two) times daily.     Cholecalciferol (VITAMIN D3) 50 MCG (2000 UT) capsule Take 2,000 Units by mouth daily.     Ciclopirox (CICLOPIROX TREATMENT) 8 % KIT Apply 1 application topically at bedtime.     co-enzyme Q-10 30 MG capsule Take 30 mg by mouth every evening.     fish oil-omega-3 fatty acids 1000 MG capsule Take 1 g by mouth daily.     fluticasone (FLONASE) 50 MCG/ACT nasal spray USE 2 SPRAYS IN EACH NOSTRIL EVERY NIGHT 48 mL 6   hydrochlorothiazide (HYDRODIURIL) 25 MG tablet Take 25 mg by mouth daily.     hydrocortisone 2.5 % cream Apply 1 application topically 2 (two) times daily as needed.     Hypertonic Nasal Wash (SINUS RINSE BOTTLE KIT) PACK Place into the nose at bedtime.     Lidocaine-Hydrocortisone Ace 3-2.5 % KIT Place 1 application rectally daily as needed (hemorrhoids). 1 kit 3   metoprolol tartrate (LOPRESSOR) 100 MG tablet TAKE 1 TABLET BY MOUTH TWICE A DAY 180 tablet 2   Multiple Vitamin (MULTIVITAMIN) tablet Take 1 tablet by mouth daily.     oxymetazoline (AFRIN) 0.05 % nasal spray Place 2 sprays into the nose 2 (two) times daily.     pantoprazole (PROTONIX) 40 MG tablet TAKE 1 TABLET BY MOUTH EVERY DAY 90 tablet 3   ramipril (ALTACE) 10 MG capsule TAKE 1 CAPSULE (10 MG TOTAL) BY MOUTH 2 (TWO) TIMES DAILY 180 capsule 3   rivaroxaban (XARELTO) 20 MG TABS tablet Take 20 mg by mouth daily.     rosuvastatin  (CRESTOR) 20 MG tablet Take 20 mg by mouth daily.     sertraline (ZOLOFT) 100 MG tablet Take 100 mg by mouth daily.     traMADol (ULTRAM) 50 MG tablet Take 1-2 tablets by mouth as needed.     Wheat Dextrin (BENEFIBER PO) Take 1 Dose by mouth 2 (two) times daily.     No current facility-administered medications for this visit.     Physical Exam: BP (!) 158/66 (BP Location: Right Arm, Patient Position: Sitting)   Pulse 65   Resp 20   Ht 5' 10"  (1.778 m)   Wt 226 lb (102.5 kg)   SpO2 92% Comment: RA  BMI 32.43 kg/m  He looks well. Cardiac exam shows a regular rate and rhythm with normal heart sounds.  There is no murmur. Lungs are clear.   Diagnostic Tests:  Narrative & Impression  CLINICAL DATA:  Thoracic aortic aneurysm.   EXAM: CT ANGIOGRAPHY CHEST WITH CONTRAST   TECHNIQUE: Multidetector CT imaging of the chest was performed using the standard protocol during bolus administration of intravenous contrast. Multiplanar CT image reconstructions and MIPs were obtained to evaluate the vascular anatomy.   CONTRAST:  85m ISOVUE-370 IOPAMIDOL (ISOVUE-370) INJECTION 76%   COMPARISON:  December 02, 2020.  June 03, 2020.   FINDINGS: Cardiovascular: Grossly stable  4.5 cm ascending thoracic aortic aneurysm is noted. Atherosclerosis of thoracic aorta is noted without dissection. Normal cardiac size. No pericardial effusion. Moderate coronary artery calcifications are noted.   Mediastinum/Nodes: No enlarged mediastinal, hilar, or axillary lymph nodes. Thyroid gland, trachea, and esophagus demonstrate no significant findings.   Lungs/Pleura: No pneumothorax or pleural effusion is noted. Stable calcified granuloma noted in left posterior costophrenic sulcus. No acute abnormality is noted.   Upper Abdomen: Hepatic steatosis.   Musculoskeletal: No chest wall abnormality. No acute or significant osseous findings.   Review of the MIP images confirms the above findings.    IMPRESSION: Grossly stable 4.5 cm ascending thoracic aortic aneurysm. Recommend semi-annual imaging followup by CTA or MRA and referral to cardiothoracic surgery if not already obtained. This recommendation follows 2010 ACCF/AHA/AATS/ACR/ASA/SCA/SCAI/SIR/STS/SVM Guidelines for the Diagnosis and Management of Patients With Thoracic Aortic Disease. Circulation. 2010; 121: F643-P295. Aortic aneurysm NOS (ICD10-I71.9).   Hepatic steatosis.   Aortic Atherosclerosis (ICD10-I70.0).     Electronically Signed   By: Marijo Conception M.D.   On: 06/02/2021 15:34      Impression:  He has a stable 4.5 cm ascending aortic aneurysm which has not changed dating back to 2017.  This is well below the surgical threshold of 5.5 cm.  I reviewed the CTA images with him and answered all of his questions.  I stressed the importance of continued good blood pressure control in preventing further enlargement and acute aortic dissection.  I have recommended that we wait for 2 years to do a follow-up scan since this has been stable and is relatively small.  I reviewed my recommendations not to perform any heavy lifting that may require a Valsalva maneuver and to avoid quinolone antibiotics that have been associated with aortic aneurysm disease.  Plan:  He will return to see me in 2 years with a CTA of the chest.  I spent 15 minutes performing this established patient evaluation and > 50% of this time was spent face to face counseling and coordinating the care of this patient's aortic aneurysm.    Gaye Pollack, MD [facility] [phone]

## 2021-06-04 ENCOUNTER — Telehealth: Payer: Self-pay | Admitting: Neurology

## 2021-06-04 NOTE — Telephone Encounter (Signed)
Went over the results of the MRI of the brain and cervical spine with [name].  The MRI of the brain continues to show T2/FLAIR hyperintense foci.  Although statistically these are more likely to represent chronic microvascular ischemic change, several of the foci are radially oriented to the ventricles and it is possible he has a very mild multiple sclerosis (his son is a patient of mine who has MS).  However, given that there was very little change over 10 years and he is 52 I do not think I would treat even if we were sure of the diagnosis.  Therefore, we will hold off on a lumbar puncture.  MRI of the cervical spine showed a normal spinal cord and some degenerative changes worse at C3-C4.

## 2021-06-14 DIAGNOSIS — E7800 Pure hypercholesterolemia, unspecified: Secondary | ICD-10-CM | POA: Diagnosis not present

## 2021-06-14 DIAGNOSIS — R7303 Prediabetes: Secondary | ICD-10-CM | POA: Diagnosis not present

## 2021-06-14 DIAGNOSIS — I10 Essential (primary) hypertension: Secondary | ICD-10-CM | POA: Diagnosis not present

## 2021-06-14 DIAGNOSIS — K219 Gastro-esophageal reflux disease without esophagitis: Secondary | ICD-10-CM | POA: Diagnosis not present

## 2021-07-01 ENCOUNTER — Other Ambulatory Visit (INDEPENDENT_AMBULATORY_CARE_PROVIDER_SITE_OTHER): Payer: Self-pay | Admitting: Otolaryngology

## 2021-07-16 DIAGNOSIS — J340 Abscess, furuncle and carbuncle of nose: Secondary | ICD-10-CM | POA: Diagnosis not present

## 2021-07-16 DIAGNOSIS — L218 Other seborrheic dermatitis: Secondary | ICD-10-CM | POA: Diagnosis not present

## 2021-07-23 ENCOUNTER — Other Ambulatory Visit: Payer: Self-pay

## 2021-07-23 ENCOUNTER — Inpatient Hospital Stay: Payer: Medicare Other | Attending: Oncology

## 2021-07-23 DIAGNOSIS — Z7901 Long term (current) use of anticoagulants: Secondary | ICD-10-CM | POA: Diagnosis not present

## 2021-07-23 DIAGNOSIS — Z79899 Other long term (current) drug therapy: Secondary | ICD-10-CM | POA: Insufficient documentation

## 2021-07-23 DIAGNOSIS — Z86718 Personal history of other venous thrombosis and embolism: Secondary | ICD-10-CM | POA: Diagnosis not present

## 2021-07-23 DIAGNOSIS — Z9079 Acquired absence of other genital organ(s): Secondary | ICD-10-CM | POA: Diagnosis not present

## 2021-07-23 DIAGNOSIS — C61 Malignant neoplasm of prostate: Secondary | ICD-10-CM | POA: Insufficient documentation

## 2021-07-23 DIAGNOSIS — Z8546 Personal history of malignant neoplasm of prostate: Secondary | ICD-10-CM

## 2021-07-23 LAB — CMP (CANCER CENTER ONLY)
ALT: 46 U/L — ABNORMAL HIGH (ref 0–44)
AST: 33 U/L (ref 15–41)
Albumin: 4.4 g/dL (ref 3.5–5.0)
Alkaline Phosphatase: 60 U/L (ref 38–126)
Anion gap: 10 (ref 5–15)
BUN: 11 mg/dL (ref 8–23)
CO2: 29 mmol/L (ref 22–32)
Calcium: 9.4 mg/dL (ref 8.9–10.3)
Chloride: 104 mmol/L (ref 98–111)
Creatinine: 1.09 mg/dL (ref 0.61–1.24)
GFR, Estimated: >60 mL/min (ref >60)
Glucose, Bld: 100 mg/dL — ABNORMAL HIGH (ref 70–99)
Potassium: 3.5 mmol/L (ref 3.5–5.1)
Sodium: 143 mmol/L (ref 135–145)
Total Bilirubin: 1.1 mg/dL (ref 0.3–1.2)
Total Protein: 7 g/dL (ref 6.5–8.1)

## 2021-07-23 LAB — CBC WITH DIFFERENTIAL (CANCER CENTER ONLY)
Abs Immature Granulocytes: 0.01 10*3/uL (ref 0.00–0.07)
Basophils Absolute: 0 10*3/uL (ref 0.0–0.1)
Basophils Relative: 0 %
Eosinophils Absolute: 0.1 10*3/uL (ref 0.0–0.5)
Eosinophils Relative: 2 %
HCT: 42.6 % (ref 39.0–52.0)
Hemoglobin: 14.4 g/dL (ref 13.0–17.0)
Immature Granulocytes: 0 %
Lymphocytes Relative: 30 %
Lymphs Abs: 1.5 10*3/uL (ref 0.7–4.0)
MCH: 30.1 pg (ref 26.0–34.0)
MCHC: 33.8 g/dL (ref 30.0–36.0)
MCV: 89.1 fL (ref 80.0–100.0)
Monocytes Absolute: 0.4 10*3/uL (ref 0.1–1.0)
Monocytes Relative: 7 %
Neutro Abs: 3.1 10*3/uL (ref 1.7–7.7)
Neutrophils Relative %: 61 %
Platelet Count: 155 10*3/uL (ref 150–400)
RBC: 4.78 MIL/uL (ref 4.22–5.81)
RDW: 13.4 % (ref 11.5–15.5)
WBC Count: 5.1 10*3/uL (ref 4.0–10.5)
nRBC: 0 % (ref 0.0–0.2)

## 2021-07-24 LAB — PROSTATE-SPECIFIC AG, SERUM (LABCORP): Prostate Specific Ag, Serum: <0.1 ng/mL (ref 0.0–4.0)

## 2021-07-30 ENCOUNTER — Other Ambulatory Visit: Payer: Self-pay

## 2021-07-30 ENCOUNTER — Inpatient Hospital Stay: Payer: Medicare Other | Admitting: Oncology

## 2021-07-30 VITALS — BP 156/74 | HR 64 | Temp 97.7°F | Resp 20 | Ht 70.0 in | Wt 224.6 lb

## 2021-07-30 DIAGNOSIS — Z8546 Personal history of malignant neoplasm of prostate: Secondary | ICD-10-CM | POA: Diagnosis not present

## 2021-07-30 DIAGNOSIS — D485 Neoplasm of uncertain behavior of skin: Secondary | ICD-10-CM | POA: Diagnosis not present

## 2021-07-30 DIAGNOSIS — D2239 Melanocytic nevi of other parts of face: Secondary | ICD-10-CM | POA: Diagnosis not present

## 2021-07-30 DIAGNOSIS — Z23 Encounter for immunization: Secondary | ICD-10-CM | POA: Diagnosis not present

## 2021-07-30 DIAGNOSIS — Z79899 Other long term (current) drug therapy: Secondary | ICD-10-CM | POA: Diagnosis not present

## 2021-07-30 DIAGNOSIS — Z7901 Long term (current) use of anticoagulants: Secondary | ICD-10-CM | POA: Diagnosis not present

## 2021-07-30 DIAGNOSIS — L821 Other seborrheic keratosis: Secondary | ICD-10-CM | POA: Diagnosis not present

## 2021-07-30 DIAGNOSIS — Z86718 Personal history of other venous thrombosis and embolism: Secondary | ICD-10-CM | POA: Diagnosis not present

## 2021-07-30 DIAGNOSIS — L219 Seborrheic dermatitis, unspecified: Secondary | ICD-10-CM | POA: Diagnosis not present

## 2021-07-30 DIAGNOSIS — D2339 Other benign neoplasm of skin of other parts of face: Secondary | ICD-10-CM | POA: Diagnosis not present

## 2021-07-30 DIAGNOSIS — C61 Malignant neoplasm of prostate: Secondary | ICD-10-CM | POA: Diagnosis not present

## 2021-07-30 NOTE — Progress Notes (Signed)
Hematology and Oncology Follow Up Visit  [name] [id number] 09/01/1945 75 y.o. 07/30/2021 8:50 AM [name], MDPharr, [name], MD   Principle Diagnosis: 75 year old man with prostate cancer after presenting with a Gleason score of 6 diagnosed in 2010.  Prior Therapy: Status post prostatectomy done on 10/20/2008.  Current therapy: Active surveillance.   Interim History: [name] is here for a follow-up visit.  Since the last visit, he reports no major changes in his health.  He denies any recent hospitalizations or illnesses.  He denies any abdominal pain or discomfort.  He continues to be on Xarelto without any bleeding or thrombosis episodes.     Medications: Reviewed without changes. Current Outpatient Medications  Medication Sig Dispense Refill   ALPRAZolam (XANAX) 1 MG tablet Take 1 tablet by mouth 2 (two) times daily.     Cholecalciferol (VITAMIN D3) 50 MCG (2000 UT) capsule Take 2,000 Units by mouth daily.     Ciclopirox (CICLOPIROX TREATMENT) 8 % KIT Apply 1 application topically at bedtime.     co-enzyme Q-10 30 MG capsule Take 30 mg by mouth every evening.     fish oil-omega-3 fatty acids 1000 MG capsule Take 1 g by mouth daily.     fluticasone (FLONASE) 50 MCG/ACT nasal spray USE 2 SPRAYS IN EACH NOSTRIL EVERY NIGHT 48 mL 6   hydrochlorothiazide (HYDRODIURIL) 25 MG tablet Take 25 mg by mouth daily.     hydrocortisone 2.5 % cream Apply 1 application topically 2 (two) times daily as needed.     Hypertonic Nasal Wash (SINUS RINSE BOTTLE KIT) PACK Place into the nose at bedtime.     Lidocaine-Hydrocortisone Ace 3-2.5 % KIT Place 1 application rectally daily as needed (hemorrhoids). 1 kit 3   metoprolol tartrate (LOPRESSOR) 100 MG tablet TAKE 1 TABLET BY MOUTH TWICE A DAY 180 tablet 2   Multiple Vitamin (MULTIVITAMIN) tablet Take 1 tablet by mouth daily.     oxymetazoline (AFRIN) 0.05 % nasal spray Place 2 sprays into the nose 2 (two) times daily.     pantoprazole  (PROTONIX) 40 MG tablet TAKE 1 TABLET BY MOUTH EVERY DAY 90 tablet 3   ramipril (ALTACE) 10 MG capsule TAKE 1 CAPSULE (10 MG TOTAL) BY MOUTH 2 (TWO) TIMES DAILY 180 capsule 3   rivaroxaban (XARELTO) 20 MG TABS tablet Take 20 mg by mouth daily.     rosuvastatin (CRESTOR) 20 MG tablet Take 20 mg by mouth daily.     sertraline (ZOLOFT) 100 MG tablet Take 100 mg by mouth daily.     traMADol (ULTRAM) 50 MG tablet Take 1-2 tablets by mouth as needed.     Wheat Dextrin (BENEFIBER PO) Take 1 Dose by mouth 2 (two) times daily.     No current facility-administered medications for this visit.     Allergies:  Allergies  Allergen Reactions   Aspirin Other (See Comments)    REACTION: gi upset with higher dose   Penicillins Itching      Physical Exam:  Blood pressure (!) 156/74, pulse 64, temperature 97.7 F (36.5 C), temperature source Tympanic, resp. rate 20, height 5' 10"  (1.778 m), weight 224 lb 9.6 oz (101.9 kg), SpO2 99 %.    ECOG: 0    General appearance: Alert, awake without any distress. Head: Atraumatic without abnormalities Oropharynx: Without any thrush or ulcers. Eyes: No scleral icterus. Lymph nodes: No lymphadenopathy noted in the cervical, supraclavicular, or axillary nodes Heart:regular rate and rhythm, without any murmurs or gallops.  Lung: Clear to auscultation without any rhonchi, wheezes or dullness to percussion. Abdomin: Soft, nontender without any shifting dullness or ascites. Musculoskeletal: No clubbing or cyanosis. Neurological: No motor or sensory deficits. Skin: No rashes or lesions.     Lab Results: Lab Results  Component Value Date   WBC 5.1 07/23/2021   HGB 14.4 07/23/2021   HCT 42.6 07/23/2021   MCV 89.1 07/23/2021   PLT 155 07/23/2021     Chemistry      Component Value Date/Time   NA 143 07/23/2021 0915   NA 142 04/18/2018 1324   NA 140 07/14/2017 0817   K 3.5 07/23/2021 0915   K 3.8 07/14/2017 0817   CL 104 07/23/2021 0915   CL  103 11/02/2012 0737   CO2 29 07/23/2021 0915   CO2 28 07/14/2017 0817   BUN 11 07/23/2021 0915   BUN 12 04/18/2018 1324   BUN 16.8 07/14/2017 0817   CREATININE 1.09 07/23/2021 0915   CREATININE 1.1 07/14/2017 0817      Component Value Date/Time   CALCIUM 9.4 07/23/2021 0915   CALCIUM 9.4 07/14/2017 0817   ALKPHOS 60 07/23/2021 0915   ALKPHOS 67 07/14/2017 0817   AST 33 07/23/2021 0915   AST 35 (H) 07/14/2017 0817   ALT 46 (H) 07/23/2021 0915   ALT 57 (H) 07/14/2017 0817   BILITOT 1.1 07/23/2021 0915   BILITOT 0.60 07/14/2017 0817       Latest Reference Range & Units 07/23/21 09:15  Prostate Specific Ag, Serum 0.0 - 4.0 ng/mL <0.1    Impression and Plan:   75 year old man with:  1. Prostate cancer diagnosed in 2010.  He underwent prostatectomy and found to have Gleason score disease without any evidence of relapse.    He remains on active surveillance at this time without any evidence of relapsed disease.  His PSA continues to be undetectable.  Risk of relapse and different salvage therapy options were discussed at this time.  These would include androgen deprivation on other options.  At this time, his risk of relapse is low given his low-grade disease and remote diagnosis.  We will continue with annual surveillance at this time.  2.  Superficial bladder tumor: He continues to follow with urology without any evidence of relapsed disease..  3.  Venous thromboembolism:.  He has history Deep vein thrombosis.  He is currently on Xarelto which will be continued indefinitely.  4. Follow-up: He will return in 12 months for follow-up visit.  30  minutes were dedicated to this visit.  The time spent on reviewing laboratory data, disease status update, assessing risk of relapse and future plan of care discussion.    Zola Button, MD 12/16/20228:50 AM

## 2021-09-08 DIAGNOSIS — R070 Pain in throat: Secondary | ICD-10-CM | POA: Diagnosis not present

## 2021-09-08 DIAGNOSIS — J342 Deviated nasal septum: Secondary | ICD-10-CM | POA: Diagnosis not present

## 2021-09-08 DIAGNOSIS — J310 Chronic rhinitis: Secondary | ICD-10-CM | POA: Diagnosis not present

## 2021-09-08 DIAGNOSIS — J343 Hypertrophy of nasal turbinates: Secondary | ICD-10-CM | POA: Diagnosis not present

## 2021-09-21 DIAGNOSIS — D1801 Hemangioma of skin and subcutaneous tissue: Secondary | ICD-10-CM | POA: Diagnosis not present

## 2021-09-21 DIAGNOSIS — L918 Other hypertrophic disorders of the skin: Secondary | ICD-10-CM | POA: Diagnosis not present

## 2021-09-21 DIAGNOSIS — L578 Other skin changes due to chronic exposure to nonionizing radiation: Secondary | ICD-10-CM | POA: Diagnosis not present

## 2021-09-21 DIAGNOSIS — L853 Xerosis cutis: Secondary | ICD-10-CM | POA: Diagnosis not present

## 2021-09-21 DIAGNOSIS — D229 Melanocytic nevi, unspecified: Secondary | ICD-10-CM | POA: Diagnosis not present

## 2021-09-21 DIAGNOSIS — L814 Other melanin hyperpigmentation: Secondary | ICD-10-CM | POA: Diagnosis not present

## 2021-09-21 DIAGNOSIS — L821 Other seborrheic keratosis: Secondary | ICD-10-CM | POA: Diagnosis not present

## 2021-09-29 ENCOUNTER — Ambulatory Visit: Payer: Medicare Other | Admitting: Internal Medicine

## 2021-09-29 ENCOUNTER — Encounter: Payer: Self-pay | Admitting: Internal Medicine

## 2021-09-29 ENCOUNTER — Other Ambulatory Visit (INDEPENDENT_AMBULATORY_CARE_PROVIDER_SITE_OTHER): Payer: Medicare Other

## 2021-09-29 VITALS — BP 126/68 | HR 62 | Ht 70.0 in | Wt 225.0 lb

## 2021-09-29 DIAGNOSIS — R102 Pelvic and perineal pain: Secondary | ICD-10-CM | POA: Diagnosis not present

## 2021-09-29 DIAGNOSIS — R1084 Generalized abdominal pain: Secondary | ICD-10-CM

## 2021-09-29 DIAGNOSIS — F418 Other specified anxiety disorders: Secondary | ICD-10-CM | POA: Diagnosis not present

## 2021-09-29 DIAGNOSIS — Z8546 Personal history of malignant neoplasm of prostate: Secondary | ICD-10-CM | POA: Diagnosis not present

## 2021-09-29 DIAGNOSIS — K581 Irritable bowel syndrome with constipation: Secondary | ICD-10-CM

## 2021-09-29 NOTE — Progress Notes (Signed)
[name] 76 y.o. [birth year] [id number]  Assessment & Plan:   Encounter Diagnoses  Name Primary?   Pelvic pain Yes   Generalized abdominal pain    History of prostate cancer    Anxiety about health    Irritable bowel syndrome with constipation     I will go ahead and order a CT abdomen pelvis.  I suspect it will not show any problems but I think he needs this reassurance.  Further plans pending that.   Subjective:   Chief Complaint: Distended abdomen, bloating, fullness in rectum and pelvic pressure and pain  HPI [name] is a 76 year old man with IBS, history of prostatectomy, chronic abdominal pain issues who returns with some chronic issues that he thinks might be worse.  His abdomen is frequently distended feels full in the rectum and has this pelvic and rectal pain that is dull and pressure-like.  Sometimes he will have spasms and twitches in his abdomen and things seem to be a whole lot worse when sitting or lying down.  Defecation may precipitate some of this as well and also increased borborygmi is noted.  Sometimes he will have a bad "spasm like pain in my rectum" if he has been sitting for prolonged periods.  Bowel movements are frequently flat and ribbonlike using suppositories to get a bowel movement at times.  He is on Benefiber prunes and MiraLAX to help things move along though he does not think it fixes anything.  He is worried because he has a history of prostate and bladder cancer.  He did pelvic floor physical therapy in the past and it benefited him but the relief was not persistent.  I think he does do some home exercise program but not to the level he was when he was in therapy.  His wife has some deteriorating health and he is very concerned about his ability to be able to care for her.  So he wants to make sure there is nothing major wrong that would be a problem.  He has a known history of IBS and he has had a long history of GI and other symptoms since  the age of 32 he says.  He is trying to eat Keffer and yogurt and decrease sugar.  He has a history of fatty liver and abdominal obesity/metabolic syndrome like problems.  Saw neurology recently and had an MRI of the head and he has some lesions in the white matter and it is not thought to be MS and its really overall stable.  CBC Latest Ref Rng & Units 07/23/2021 07/24/2020 07/26/2019  WBC 4.0 - 10.5 K/uL 5.1 4.7 5.3  Hemoglobin 13.0 - 17.0 g/dL 14.4 14.1 14.2  Hematocrit 39.0 - 52.0 % 42.6 40.6 42.9  Platelets 150 - 400 K/uL 155 152 165   Wt Readings from Last 3 Encounters:  09/29/21 225 lb (102.1 kg)  07/30/21 224 lb 9.6 oz (101.9 kg)  06/02/21 226 lb (102.5 kg)   Last colonoscopy 08/04/2019 with 2 diminutive adenomas, internal hemorrhoids and diverticulosis  Allergies  Allergen Reactions   Aspirin Other (See Comments)    REACTION: gi upset with higher dose   Penicillins Itching   Current Meds  Medication Sig   ALPRAZolam (XANAX) 1 MG tablet Take 1 tablet by mouth 2 (two) times daily.   Cholecalciferol (VITAMIN D3) 50 MCG (2000 UT) capsule Take 2,000 Units by mouth daily.   Ciclopirox (CICLOPIROX TREATMENT) 8 % KIT Apply  1 application topically at bedtime.   co-enzyme Q-10 30 MG capsule Take 30 mg by mouth every evening.   fluticasone (FLONASE) 50 MCG/ACT nasal spray USE 2 SPRAYS IN EACH NOSTRIL EVERY NIGHT   hydrochlorothiazide (HYDRODIURIL) 25 MG tablet Take 25 mg by mouth daily.   hydrocortisone 2.5 % cream Apply 1 application topically 2 (two) times daily as needed.   Hypertonic Nasal Wash (SINUS RINSE BOTTLE KIT) PACK Place into the nose at bedtime.   Lidocaine-Hydrocortisone Ace 3-2.5 % KIT Place 1 application rectally daily as needed (hemorrhoids).   metoprolol tartrate (LOPRESSOR) 100 MG tablet TAKE 1 TABLET BY MOUTH TWICE A DAY   Multiple Vitamin (MULTIVITAMIN) tablet Take 1 tablet by mouth daily.   oxymetazoline (AFRIN) 0.05 % nasal spray Place 2 sprays into the nose 2  (two) times daily.   pantoprazole (PROTONIX) 40 MG tablet TAKE 1 TABLET BY MOUTH EVERY DAY   ramipril (ALTACE) 10 MG capsule TAKE 1 CAPSULE (10 MG TOTAL) BY MOUTH 2 (TWO) TIMES DAILY   rivaroxaban (XARELTO) 20 MG TABS tablet Take 20 mg by mouth daily.   rosuvastatin (CRESTOR) 20 MG tablet Take 20 mg by mouth daily.   sertraline (ZOLOFT) 100 MG tablet Take 100 mg by mouth daily.   traMADol (ULTRAM) 50 MG tablet Take 1-2 tablets by mouth as needed.   Wheat Dextrin (BENEFIBER PO) Take 1 Dose by mouth 2 (two) times daily.   Past Medical History:  Diagnosis Date   Antiphospholipid antibody syndrome ([place]) 05/22/2012   Found subsequent to recurrent PE 9/13   Arthritis    Bladder cancer ([place]) dx'd 2009   surg only   Chronic anal fissure    w/ recurrence's   Chronic anxiety    Depression    ED (erectile dysfunction)    Esophageal candidiasis ([place]) 03/14/2017   Fatty liver    GERD (gastroesophageal reflux disease)    Heart murmur    Hepatic steatosis    Hiatal hernia    History of adenomatous polyp of colon    2005   History of bladder cancer urologist-  dr Risa Grill   papillary TCC  s/p  turbt   History of DVT (deep vein thrombosis)    post prostate surgery 2010  right LLE   History of esophageal stricture    w/ dilation   History of gastric polyp    benign 2010   History of prostate cancer urologist- dr grapey/  oncology-  dr Alen Blew   Stage  T1c , Gleason 3+3;   s/p  prostatectomy 03-08-210//  per pt currently PSA nondetectable    History of pulmonary embolus (PE)    post prostate surgery 2010  and 2013 x2 right side per CT scan (found to have lupus anticoaglated )   History of TIA (transient ischemic attack)    07/ 2012 noted on scans remote probable tia's in  age 53's   History of TMJ syndrome    HTN (hypertension)    Hyperlipidemia    Lupus anticoagulant positive    found 2013 when pt had PE   Moderate aortic regurgitation    with no  stenosis    OSA on CPAP    per study  severe osa 12-14-2010   Prolapsed internal hemorrhoids, grade 2 03/31/2014   prostate ca dx'd 2010   surg only   RLS (restless legs syndrome)    RLS (restless legs syndrome)    Sleep apnea    Symptomatic PVCs cardiologist-  dr Gwenlyn Found  intermittant   Wears glasses    Past Surgical History:  Procedure Laterality Date   APPENDECTOMY  age 56   and Left Inguinal Hernia Repair   CARDIOVASCULAR STRESS TEST  06-30-2011   dr berry   normal nuclear study/  normal LV function and wall motion, ef 56%   COLONOSCOPY  last one 10-19-2011   CYSTO/ BLADDER BX/  RETROGRADE PYELOGRAM/  TRANSRECTAL PROSTATE BX'S  07-30-2008   ESOPHAGOGASTRODUODENOSCOPY  last one 06-27-2011   EVALUATION UNDER ANESTHESIA WITH FISTULECTOMY N/A 01/14/2016   Procedure: EXAM UNDER ANESTHESIA WITH REPAIR OF PERIRECTAL FISTULA  (LIFT);  Surgeon: Michael Boston, MD;  Location: [facility];  Service: General;  Laterality: N/A;   LAPAROSCOPIC CHOLECYSTECTOMY  05-25-2007   and Excision cyst back of neck   ROBOT ASSISTED LAPAROSCOPIC RADICAL PROSTATECTOMY  10-20-2008   STRABISMUS SURGERY Bilateral age 30   TONSILLECTOMY  age 11   TRANSTHORACIC ECHOCARDIOGRAM  03-23-2015   dr berry   grade 1 diastolic dysfunction, ef 82-50%/  mild AV thickened leaflets with no stenosis,  moderate AR/  mild ascending aorta ilatation3.9cm/  trivial MR and TR   UPPER GASTROINTESTINAL ENDOSCOPY     Social History   Social History Narrative   Married to Lasana ([place])   Lives with wife   Gets regular exercise.   Daily caffeine- 2 cups daily.   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   Review of Systems As above  Objective:   Physical Exam BP 126/68    Pulse 62    Ht 5' 10" (1.778 m)    Wt 225 lb (102.1 kg)    BMI 32.28 kg/m  Abdomen slightly protuberant/obese soft not particularly tender there is some discomfort with deep palpation.  I do not detect any hernias.  No organomegaly or mass.

## 2021-09-29 NOTE — Patient Instructions (Addendum)
Your provider has requested that you go to the basement level for lab work before leaving today. Press "B" on the elevator. The lab is located at the first door on the left as you exit the elevator.  Due to recent changes in healthcare laws, you may see the results of your imaging and laboratory studies on MyChart before your provider has had a chance to review them.  We understand that in some cases there may be results that are confusing or concerning to you. Not all laboratory results come back in the same time frame and the provider may be waiting for multiple results in order to interpret others.  Please give [place] 48 hours in order for your provider to thoroughly review all the results before contacting the office for clarification of your results.   You have been scheduled for a CT scan of the abdomen and pelvis at [facility] ([address]---this is in the same building as [facility]).   You are scheduled on _2/23/23___ at ____9:30am___. You should arrive 15 minutes prior to your appointment time for registration. Please follow the written instructions below on the day of your exam:  WARNING: IF YOU ARE ALLERGIC TO IODINE/X-RAY DYE, PLEASE NOTIFY RADIOLOGY IMMEDIATELY AT [phone]! YOU WILL BE GIVEN A 13 HOUR PREMEDICATION PREP.  1) Do not eat or drink anything after __5:30am__ (4 hours prior to your test) 2) You have been given 2 bottles of oral contrast to drink. The solution may taste better if refrigerated, but do NOT add ice or any other liquid to this solution. Shake well before drinking.    Drink 1 bottle of contrast @ _7:30am___ (2 hours prior to your exam)  Drink 1 bottle of contrast @ __8:30am__ (1 hour prior to your exam)  You may take any medications as prescribed with a small amount of water, if necessary. If you take any of the following medications: METFORMIN, GLUCOPHAGE, GLUCOVANCE, AVANDAMET, RIOMET, FORTAMET, [place] MET, JANUMET,  GLUMETZA or METAGLIP, you MAY be asked to HOLD this medication 48 hours AFTER the exam.  The purpose of you drinking the oral contrast is to aid in the visualization of your intestinal tract. The contrast solution may cause some diarrhea. Depending on your individual set of symptoms, you may also receive an intravenous injection of x-ray contrast/dye. Plan on being at [facility] for 30 minutes or longer, depending on the type of exam you are having performed.  This test typically takes 30-45 minutes to complete.  If you have any questions regarding your exam or if you need to reschedule, you may call the CT department at [phone] between the hours of 8:00 am and 5:00 pm, Monday-Friday.  ________________________________________________________________________   I appreciate the opportunity to care for you. Silvano Rusk, MD, [facility]

## 2021-09-30 LAB — CREATININE, SERUM: Creatinine, Ser: 1.02 mg/dL (ref 0.40–1.50)

## 2021-09-30 LAB — BUN: BUN: 13 mg/dL (ref 6–23)

## 2021-10-07 ENCOUNTER — Other Ambulatory Visit: Payer: Self-pay

## 2021-10-07 ENCOUNTER — Ambulatory Visit (INDEPENDENT_AMBULATORY_CARE_PROVIDER_SITE_OTHER)
Admission: RE | Admit: 2021-10-07 | Discharge: 2021-10-07 | Disposition: A | Discharge status: Home / Self Care | Payer: Medicare Other | Source: Ambulatory Visit | Attending: Internal Medicine | Admitting: Internal Medicine

## 2021-10-07 DIAGNOSIS — R102 Pelvic and perineal pain: Secondary | ICD-10-CM

## 2021-10-07 DIAGNOSIS — K760 Fatty (change of) liver, not elsewhere classified: Secondary | ICD-10-CM | POA: Diagnosis not present

## 2021-10-07 DIAGNOSIS — K5900 Constipation, unspecified: Secondary | ICD-10-CM | POA: Diagnosis not present

## 2021-10-07 DIAGNOSIS — R1084 Generalized abdominal pain: Secondary | ICD-10-CM

## 2021-10-07 DIAGNOSIS — I700 Atherosclerosis of aorta: Secondary | ICD-10-CM | POA: Diagnosis not present

## 2021-10-07 DIAGNOSIS — Z8546 Personal history of malignant neoplasm of prostate: Secondary | ICD-10-CM | POA: Diagnosis not present

## 2021-10-07 MED ORDER — IOHEXOL 300 MG/ML  SOLN
100.0000 mL | Freq: Once | INTRAMUSCULAR | Status: AC | PRN
  Administered 2021-10-07: 100 mL via INTRAVENOUS

## 2021-10-21 ENCOUNTER — Ambulatory Visit: Payer: Medicare Other | Admitting: Neurology

## 2021-11-04 DIAGNOSIS — I10 Essential (primary) hypertension: Secondary | ICD-10-CM | POA: Diagnosis not present

## 2021-11-09 DIAGNOSIS — Z8349 Family history of other endocrine, nutritional and metabolic diseases: Secondary | ICD-10-CM | POA: Diagnosis not present

## 2021-11-09 DIAGNOSIS — M25561 Pain in right knee: Secondary | ICD-10-CM | POA: Diagnosis not present

## 2021-11-09 DIAGNOSIS — I7121 Aneurysm of the ascending aorta, without rupture: Secondary | ICD-10-CM | POA: Diagnosis not present

## 2021-11-09 DIAGNOSIS — I700 Atherosclerosis of aorta: Secondary | ICD-10-CM | POA: Diagnosis not present

## 2021-11-09 DIAGNOSIS — I2510 Atherosclerotic heart disease of native coronary artery without angina pectoris: Secondary | ICD-10-CM | POA: Diagnosis not present

## 2021-11-09 DIAGNOSIS — J309 Allergic rhinitis, unspecified: Secondary | ICD-10-CM | POA: Diagnosis not present

## 2021-11-09 DIAGNOSIS — K219 Gastro-esophageal reflux disease without esophagitis: Secondary | ICD-10-CM | POA: Diagnosis not present

## 2021-11-09 DIAGNOSIS — M2550 Pain in unspecified joint: Secondary | ICD-10-CM | POA: Diagnosis not present

## 2021-11-09 DIAGNOSIS — Z0000 Encounter for general adult medical examination without abnormal findings: Secondary | ICD-10-CM | POA: Diagnosis not present

## 2021-11-09 DIAGNOSIS — D6861 Antiphospholipid syndrome: Secondary | ICD-10-CM | POA: Diagnosis not present

## 2021-11-09 DIAGNOSIS — I10 Essential (primary) hypertension: Secondary | ICD-10-CM | POA: Diagnosis not present

## 2021-11-10 DIAGNOSIS — M2550 Pain in unspecified joint: Secondary | ICD-10-CM | POA: Diagnosis not present

## 2021-11-10 DIAGNOSIS — Z8349 Family history of other endocrine, nutritional and metabolic diseases: Secondary | ICD-10-CM | POA: Diagnosis not present

## 2021-11-10 DIAGNOSIS — M545 Low back pain, unspecified: Secondary | ICD-10-CM | POA: Diagnosis not present

## 2021-11-10 DIAGNOSIS — M791 Myalgia, unspecified site: Secondary | ICD-10-CM | POA: Diagnosis not present

## 2021-11-10 DIAGNOSIS — I2510 Atherosclerotic heart disease of native coronary artery without angina pectoris: Secondary | ICD-10-CM | POA: Diagnosis not present

## 2021-11-10 DIAGNOSIS — Z7901 Long term (current) use of anticoagulants: Secondary | ICD-10-CM | POA: Diagnosis not present

## 2021-11-15 NOTE — Progress Notes (Signed)
? ? ?  Subjective:   ? ?CC: R knee pain ? ?I, [name], LAT, ATC, am serving as scribe for Dr. [name]. ? ?HPI: Pt is a 76 y/o male presenting w/ c/o R knee pain x few months w/ no specific known MOI.  He does recall hitting his knee a few months ago but isn't sure if this is related.  He locates his pain to his R lateral knee. ? ?R knee swelling: yes ?R knee mechanical symptoms: yes ?Aggravating factors: transitioning from squat to stand;  ?Treatments tried: Nothing really ? ?Diagnostic testing: R knee XR- 11/09/21 ? ?He also reports chronic hx of R hip/low back pain. ? ?Pertinent review of Systems: No fevers or chills ? ?Relevant historical information: Pulmonary [place].  Sleep apnea. ? ? ?Objective:   ? ?Vitals:  ? 11/16/21 1257  ?BP: 130/74  ?Pulse: 68  ?SpO2: 95%  ? ?General: Well Developed, well nourished, and in no acute distress.  ? ?MSK: Right hip normal. ?Mildly tender palpation just superior of the greater trochanter.  Normal hip motion.  Some pain with hip abduction. ? ?Right knee normal-appearing ?Normal motion with crepitation. ?Mildly tender palpation at lateral joint line. ?Stable ligamentous exam. ?Positive lateral McMurray's test. ?Intact strength. ? ?Lab and Radiology Results ? ?Diagnostic Limited MSK Ultrasound of: Right knee ?Quad tendon intact normal. ?Patellar tendon normal. ?Lateral joint line no significant DJD.  Degenerative appearing lateral meniscus. ?Medial joint line narrowed degenerative appearing medial joint line and degenerative appearing medial meniscus. ?Impression: Medial compartment DJD and some degenerative appearance of the lateral meniscus. ? ?X-ray images right knee obtained primary care provider office and provided by the patient on a CD only and independently interpreted. ?Mild medial compartment DJD.  No acute fractures. ? ? ?Impression and Recommendations:   ? ?Assessment and Plan: ?76 y.o. male with right knee pain thought to be due to medial compartment DJD  and perhaps lateral degenerative meniscus tear.  We discussed options.  He has not all that much bothered by this so would like to try some conservative management options.  Plan on quad strengthening exercises taught in clinic today by myself and Voltaren gel.  I did recommend physical therapy which she would like to think about.  Steroid injection certainly could be helpful in the future.  He will let me know if he would like to proceed with that. ? ?Additionally he does have some lateral hip pain.  This was more of a back burner issue for him.  This very likely is trochanteric bursitis and would be treatable with physical therapy.  He will let me know if he like me to order PT in the future.. ? ?PDMP not reviewed this encounter. ?Orders Placed This Encounter  ?Procedures  ? [place] LIMITED JOINT SPACE STRUCTURES LOW RIGHT(NO LINKED CHARGES)  ?  Order Specific Question:   Reason for Exam (SYMPTOM  OR DIAGNOSIS REQUIRED)  ?  Answer:   R knee pain  ?  Order Specific Question:   Preferred imaging location?  ?  Answer:   [facility]  ? ?No orders of the defined types were placed in this encounter. ? ? ?Discussed warning signs or symptoms. Please see discharge instructions. Patient expresses understanding. ? ? ?The above documentation has been reviewed and is accurate and complete [name], M.D. ? ?

## 2021-11-16 ENCOUNTER — Ambulatory Visit (INDEPENDENT_AMBULATORY_CARE_PROVIDER_SITE_OTHER): Payer: Medicare Other | Admitting: Family Medicine

## 2021-11-16 ENCOUNTER — Ambulatory Visit: Payer: Self-pay

## 2021-11-16 ENCOUNTER — Encounter: Payer: Self-pay | Admitting: Family Medicine

## 2021-11-16 VITALS — BP 130/74 | HR 68 | Ht 70.0 in | Wt 226.2 lb

## 2021-11-16 DIAGNOSIS — M25561 Pain in right knee: Secondary | ICD-10-CM | POA: Diagnosis not present

## 2021-11-16 DIAGNOSIS — G8929 Other chronic pain: Secondary | ICD-10-CM | POA: Diagnosis not present

## 2021-11-16 DIAGNOSIS — M25551 Pain in right hip: Secondary | ICD-10-CM

## 2021-11-16 NOTE — Patient Instructions (Addendum)
Nice to meet you today. ? ?Please use Voltaren gel (Generic Diclofenac Gel) up to 4x daily for pain as needed.  This is available over-the-counter as both the name brand Voltaren gel and the generic diclofenac gel.  ? ?Follow-up: as needed ? ?Let me know if you want PT or an injection. ?

## 2021-11-23 DIAGNOSIS — G4733 Obstructive sleep apnea (adult) (pediatric): Secondary | ICD-10-CM | POA: Diagnosis not present

## 2022-01-09 ENCOUNTER — Other Ambulatory Visit: Payer: Self-pay | Admitting: Cardiovascular Disease

## 2022-01-26 DIAGNOSIS — L918 Other hypertrophic disorders of the skin: Secondary | ICD-10-CM | POA: Diagnosis not present

## 2022-01-26 DIAGNOSIS — G8929 Other chronic pain: Secondary | ICD-10-CM | POA: Diagnosis not present

## 2022-01-26 DIAGNOSIS — M546 Pain in thoracic spine: Secondary | ICD-10-CM | POA: Diagnosis not present

## 2022-01-27 DIAGNOSIS — H2513 Age-related nuclear cataract, bilateral: Secondary | ICD-10-CM | POA: Diagnosis not present

## 2022-01-27 DIAGNOSIS — H16223 Keratoconjunctivitis sicca, not specified as Sjogren's, bilateral: Secondary | ICD-10-CM | POA: Diagnosis not present

## 2022-01-27 DIAGNOSIS — H25043 Posterior subcapsular polar age-related cataract, bilateral: Secondary | ICD-10-CM | POA: Diagnosis not present

## 2022-01-27 DIAGNOSIS — H25013 Cortical age-related cataract, bilateral: Secondary | ICD-10-CM | POA: Diagnosis not present

## 2022-02-16 ENCOUNTER — Other Ambulatory Visit: Payer: Self-pay | Admitting: Cardiovascular Disease

## 2022-02-18 NOTE — Progress Notes (Unsigned)
I, Wendy Poet, LAT, ATC, am serving as scribe for Dr. [name].  [name] is a 76 y.o. male who presents to [facility] at [facility] today for f/u of R knee pain thought to be due to medial compartment DJD and perhaps lateral degenerative meniscus tear and back pain.  He was last seen by Dr. Georgina Snell on 11/16/21 for his R knee and was shown some quad strengthening exercises as a HEP by Dr. Georgina Snell and advised to use Voltaren gel.  Today, pt reports that his R knee pain is improving.  He notes new-onset R-sided low back pain w/ radiating pain into his R lateral hip w no known MOI.   Radiating pain: yes into his R glute/lateral hip LE paresthesias: no Aggravating factors: sitting; lumbar flexion Treatments tried: Tramadol;   Diagnostic testing: R knee XR- 11/09/21; L-spine and sacrum XR- 11/24/20  Pertinent review of systems: No fevers or chills  Relevant historical information: Prostate cancer history.   Exam:  BP 122/70 (BP Location: Left Arm, Patient Position: Sitting, Cuff Size: Large)   Pulse 71   Ht '5\' 10"'$  (1.778 m)   Wt 227 lb 9.6 oz (103.2 kg)   SpO2 96%   BMI 32.66 kg/m  General: Well Developed, well nourished, and in no acute distress.   MSK: L-spine: Nontender midline.  Tender palpation right lumbar paraspinal musculature. Decreased lumbar motion. Lower extremity strength is intact except noted below. Right hip: Normal-appearing Tender palpation greater trochanter. Normal hip motion. Hip abduction strength is diminished 4/5.    Lab and Radiology Results  X-ray images L-spine obtained today personally and independently interpreted. Mild scoliosis convex right.  No acute fractures.  DDD present throughout lumbar spine. Await formal radiology review  X-ray images of the L-spine and hip available on CT scan abdomen and pelvis from February 2023 personally and independently interpreted.  No acute fractures.  Mild degenerative changes present in  the lumbar spine.     Assessment and Plan: 76 y.o. male with chronic right low back pain.  This is also associated with right lateral hip pain.  The lumbar pain is thought to be due primarily to lumbosacral muscle spasm and dysfunction in fundamentally due to core weakness.  Physical therapy should be quite helpful.  The right lateral hip pain is thought to be due to trochanteric bursitis or hip abductor tendinopathy and physical therapy should also be helpful.  Refer to physical therapy.  Recheck in 2 months.   PDMP not reviewed this encounter. Orders Placed This Encounter  Procedures   DG Lumbar Spine 2-3 Views    Standing Status:   Future    Number of Occurrences:   1    Standing Expiration Date:   03/24/2022    Order Specific Question:   Reason for Exam (SYMPTOM  OR DIAGNOSIS REQUIRED)    Answer:   LBP    Order Specific Question:   Preferred imaging location?    Answer:   [facility]   Ambulatory referral to Physical Therapy    Referral Priority:   Routine    Referral Type:   Physical Medicine    Referral Reason:   Specialty Services Required    Requested Specialty:   Physical Therapy    Number of Visits Requested:   1   No orders of the defined types were placed in this encounter.    Discussed warning signs or symptoms. Please see discharge instructions. Patient expresses understanding.   The  above documentation has been reviewed and is accurate and complete [name], M.D.

## 2022-02-21 ENCOUNTER — Ambulatory Visit (INDEPENDENT_AMBULATORY_CARE_PROVIDER_SITE_OTHER): Payer: Medicare Other

## 2022-02-21 ENCOUNTER — Ambulatory Visit (INDEPENDENT_AMBULATORY_CARE_PROVIDER_SITE_OTHER): Payer: Medicare Other | Admitting: Family Medicine

## 2022-02-21 ENCOUNTER — Encounter: Payer: Self-pay | Admitting: Family Medicine

## 2022-02-21 VITALS — BP 122/70 | HR 71 | Ht 70.0 in | Wt 227.6 lb

## 2022-02-21 DIAGNOSIS — M25551 Pain in right hip: Secondary | ICD-10-CM | POA: Diagnosis not present

## 2022-02-21 DIAGNOSIS — M5441 Lumbago with sciatica, right side: Secondary | ICD-10-CM

## 2022-02-21 DIAGNOSIS — M545 Low back pain, unspecified: Secondary | ICD-10-CM | POA: Diagnosis not present

## 2022-02-21 NOTE — Patient Instructions (Addendum)
Good to see you today.  I've referred you to Physical Therapy to [facility].  Their office will call you to schedule but please let us know if you don't hear from them in one week regarding scheduling.  Please get an Xray today before you leave.  Follow-up: 2 months

## 2022-02-22 NOTE — Progress Notes (Signed)
Lumbar spine x-ray does show some arthritis changes in the low back.  No fractures are seen.

## 2022-03-09 ENCOUNTER — Ambulatory Visit: Payer: Medicare Other | Attending: Family Medicine

## 2022-03-09 DIAGNOSIS — R252 Cramp and spasm: Secondary | ICD-10-CM | POA: Insufficient documentation

## 2022-03-09 DIAGNOSIS — M5459 Other low back pain: Secondary | ICD-10-CM | POA: Diagnosis not present

## 2022-03-09 DIAGNOSIS — M5441 Lumbago with sciatica, right side: Secondary | ICD-10-CM | POA: Diagnosis not present

## 2022-03-09 DIAGNOSIS — R278 Other lack of coordination: Secondary | ICD-10-CM | POA: Insufficient documentation

## 2022-03-09 NOTE — Therapy (Addendum)
OUTPATIENT PHYSICAL THERAPY THORACOLUMBAR EVALUATION   Patient Name: [name] MRN: [id number] DOB:11/25/1945, 76 y.o., male Today's Date: 03/09/2022   PT End of Session - 03/09/22 1618     Visit Number 1    Date for PT Re-Evaluation 05/11/22    Authorization Type [employer]    Progress Note Due on Visit 10    PT Start Time 1616    PT Stop Time 1700    PT Time Calculation (min) 44 min    Activity Tolerance Patient tolerated treatment well    Behavior During Therapy [facility] for tasks assessed/performed             Past Medical History:  Diagnosis Date   Antiphospholipid antibody syndrome ([place]) 05/22/2012   Found subsequent to recurrent PE 9/13   Arthritis    Bladder cancer ([place]) dx'd 2009   surg only   Chronic anal fissure    w/ recurrence's   Chronic anxiety    Depression    ED (erectile dysfunction)    Esophageal candidiasis ([place]) 03/14/2017   Fatty liver    GERD (gastroesophageal reflux disease)    Heart murmur    Hepatic steatosis    Hiatal hernia    History of adenomatous polyp of colon    2005   History of bladder cancer urologist-  dr Risa Grill   papillary TCC  s/p  turbt   History of DVT (deep vein thrombosis)    post prostate surgery 2010  right LLE   History of esophageal stricture    w/ dilation   History of gastric polyp    benign 2010   History of prostate cancer urologist- dr grapey/  oncology-  dr Alen Blew   Stage  T1c , Gleason 3+3;   s/p  prostatectomy 03-08-210//  per pt currently PSA nondetectable    History of pulmonary embolus (PE)    post prostate surgery 2010  and 2013 x2 right side per CT scan (found to have lupus anticoaglated )   History of TIA (transient ischemic attack)    07/ 2012 noted on scans remote probable tia's in  age 30's   History of TMJ syndrome    HTN (hypertension)    Hyperlipidemia    Lupus anticoagulant positive    found 2013 when pt had PE   Moderate aortic regurgitation    with no  stenosis    OSA on CPAP     per study severe osa 12-14-2010   Prolapsed internal hemorrhoids, grade 2 03/31/2014   prostate ca dx'd 2010   surg only   RLS (restless legs syndrome)    RLS (restless legs syndrome)    Sleep apnea    Symptomatic PVCs cardiologist-  dr berry   intermittant   Wears glasses    Past Surgical History:  Procedure Laterality Date   APPENDECTOMY  age 38   and Left Inguinal Hernia Repair   CARDIOVASCULAR STRESS TEST  06-30-2011   dr berry   normal nuclear study/  normal LV function and wall motion, ef 56%   COLONOSCOPY  last one 10-19-2011   CYSTO/ BLADDER BX/  RETROGRADE PYELOGRAM/  TRANSRECTAL PROSTATE BX'S  07-30-2008   ESOPHAGOGASTRODUODENOSCOPY  last one 06-27-2011   EVALUATION UNDER ANESTHESIA WITH FISTULECTOMY N/A 01/14/2016   Procedure: EXAM UNDER ANESTHESIA WITH REPAIR OF PERIRECTAL FISTULA  (LIFT);  Surgeon: Michael Boston, MD;  Location: [facility];  Service: General;  Laterality: N/A;   LAPAROSCOPIC CHOLECYSTECTOMY  05-25-2007   and Excision  cyst back of neck   ROBOT ASSISTED LAPAROSCOPIC RADICAL PROSTATECTOMY  10-20-2008   STRABISMUS SURGERY Bilateral age 86   TONSILLECTOMY  age 13   TRANSTHORACIC ECHOCARDIOGRAM  03-23-2015   dr berry   grade 1 diastolic dysfunction, ef 59-74%/  mild AV thickened leaflets with no stenosis,  moderate AR/  mild ascending aorta ilatation3.9cm/  trivial MR and TR   UPPER GASTROINTESTINAL ENDOSCOPY     Patient Active Problem List   Diagnosis Date Noted   Gait disturbance 05/20/2021   Left sided numbness 05/20/2021   Dizzinesses 05/20/2021   Cataplexy 03/23/2021   Hypersomnia 03/23/2021   Other cervical disc degeneration, unspecified cervical region 03/23/2021   Episodic weakness 03/23/2021   Degenerative disc disease, lumbar 02/04/2021   Elevated coronary artery calcium score 12/23/2020   Thoracic aortic aneurysm ([place]) 04/05/2016   Aortic insufficiency 04/05/2016   Prolapsed internal hemorrhoids, grade 2 03/31/2014   Upper  abdominal pain - chronic 03/31/2014   Dysphagia 03/31/2014   Antiphospholipid antibody syndrome ([place]) 05/22/2012   Pulmonary embolus ([place]) 11/11/2011   DVT, lower extremity, distal 11/11/2011   Family history of malignant neoplasm of gastrointestinal tract 10/18/2011   GERD (gastroesophageal reflux disease) 06/23/2011   IBS (irritable bowel syndrome) 06/23/2011   Premature ventricular contractions 06/22/2011   Malignant neoplasm of bladder ([place]) 12/12/2008   PROSTATE CANCER, HX OF 12/12/2008   PULMONARY EMBOLISM, HX OF 12/12/2008   HYPERLIPIDEMIA 01/05/2008   ANXIETY DEPRESSION 01/05/2008   Essential hypertension 01/05/2008   Hypertensive heart disease without heart failure 01/05/2008   CHOLELITHIASIS, ASYMPTOMATIC 01/05/2008   OSA on CPAP 16/38/4536   SYSTOLIC MURMUR 46/80/3212   Hx of adenomatous colonic polyps 10/14/2003    PCP: [name]  REFERRING PROVIDER: Sherene Sires  REFERRING DIAG: M54.41  Rationale for Evaluation and Treatment Rehabilitation  THERAPY DIAG:  Acute back pain with sciatica, right  Other low back pain  Cramp and spasm  Other lack of coordination  ONSET DATE: 01/26/22  SUBJECTIVE:                                                                                                                                                                                           SUBJECTIVE STATEMENT: Was picking up something from a bench and had a sharp pain in my back. It's gotten a bit better   PERTINENT HISTORY:  Hx of prostate cancer, antiphospholipid syndrome, arthritis, hx of TIA, PE and DVT  PAIN:  Are you having pain? Yes: NPRS scale: 5/10 Pain location: R side back pain, radiates into R hip and groin  Pain description: dull, sharp Aggravating factors: turning, laughing, breathing  too hard  Relieving factors: ice, heat, muscle relacers    PRECAUTIONS: Fall  WEIGHT BEARING RESTRICTIONS No  FALLS:  Has patient fallen in last 6 months?  No  LIVING ENVIRONMENT: Lives with: lives with their spouse Lives in: House/apartment Stairs: No Has following equipment at home: Single point cane and Environmental consultant - 2 wheeled  OCCUPATION: retired  PLOF: Independent  PATIENT GOALS get rid of pain    OBJECTIVE:   DIAGNOSTIC FINDINGS:  IMPRESSION: 1. Degenerative disc disease and facet degenerative changes as above. 2. Calcified atherosclerotic change in the abdominal aorta.  PATIENT SURVEYS:  FOTO 42/100  SCREENING FOR RED FLAGS: Bowel or bladder incontinence: No Spinal tumors: No Cauda equina syndrome: No Compression fracture: No Abdominal aneurysm: No  COGNITION:  Overall cognitive status: Within functional limits for tasks assessed     SENSATION: WFL  MUSCLE LENGTH: Hamstrings: tightness noted on BLE  POSTURE: rounded shoulders, forward head, decreased lumbar lordosis, and anterior pelvic tilt  PALPATION: TTP and sore slong L1-L5, R QL and lumbar paraspinal musculature, sore on R side 11-12th rib on back  LUMBAR ROM:   Active  A/PROM  eval  Flexion 75% w/pain  Extension 50% w/pain  Right lateral flexion Fib head w/pain  Left lateral flexion Fib head w/pain  Right rotation 25% w/pain  Left rotation 30% w/pain   (Blank rows = not tested)  LOWER EXTREMITY ROM:   grossly WFL   LOWER EXTREMITY MMT:    MMT Right eval Left eval  Hip flexion 4+ 4+  Hip extension 4 w/pain 4  Hip abduction 4 4  Hip adduction    Hip internal rotation 4+ 4+  Hip external rotation 4+ 4+  Knee flexion 5 5  Knee extension 5 5  Ankle dorsiflexion 5 5  Ankle plantarflexion 5 5  Ankle inversion    Ankle eversion     (Blank rows = not tested)  LUMBAR SPECIAL TESTS:  Straight leg raise test: Positive, Slump test: Positive, and FABER test: Positive  FUNCTIONAL TESTS:  5 times sit to stand: 15.74s Timed up and go (TUG): 9.84s  GAIT: Distance walked: in clinic distances Assistive device utilized: None Level of  assistance: Complete Independence Comments: Feet externally rotated, slowed gait, decreased foot clearance and arm swing   TODAY'S TREATMENT  POC and initiate HEP    PATIENT EDUCATION:  Education details: POC and HEP Person educated: Patient Education method: Explanation Education comprehension: verbalized understanding   HOME EXERCISE PROGRAM: Access Code: [email or web address] URL: [email or web address] Date: 03/09/2022 Prepared by: Andris Baumann  Exercises - Prone Press Up On Elbows  - 2 x daily - 7 x weekly - 2 sets - 10 reps - Supine Lower Trunk Rotation  - 2 x daily - 7 x weekly - 2 sets - 10 reps - Supine Single Knee to Chest Stretch  - 2 x daily - 7 x weekly - 30 hold  ASSESSMENT:  CLINICAL IMPRESSION: Patient is a 76 y.o. male who was seen today for physical therapy evaluation and treatment for acute low back pain. Patient states he went to lift something heavy off of a bench 6-8wks ago and has been having pain on his right side since. He has some shooting pains into his R leg. His symptoms are consistent with possible disc herniation and sciatica. Patient has good strength and function overall. He presents with increased tightness bilaterally and will benefit from skilled PT intervention to address low back and lateral hip pain and deficits to be able  to go about daily activities without pain.    REHAB POTENTIAL: Good  CLINICAL DECISION MAKING: Stable/uncomplicated  EVALUATION COMPLEXITY: Low   GOALS: Goals reviewed with patient? Yes  SHORT TERM GOALS: Target date: 04/13/22  Patient will be independent with initial HEP.  Goal status: INITIAL  2.  Patient will report centralization of radicular symptoms.  Baseline: radiates into hip and sometimes down leg Goal status: INITIAL   LONG TERM GOALS: Target date: 05/11/22  Patient will be independent with advanced/ongoing HEP to improve outcomes and carryover.  Goal status: INITIAL  2.  Patient will report 75%  improvement in low back pain to improve QOL.  Goal status: INITIAL  3.  Patient will demonstrate full pain free lumbar ROM to perform ADLs.   Goal status: INITIAL  5.  Patient will report 28 on lumbar FOTO to demonstrate improved functional ability.  Baseline: 42 Goal status: INITIAL      PLAN: PT FREQUENCY: 2x/week  PT DURATION: 8 weeks  PLANNED INTERVENTIONS: Therapeutic exercises, Therapeutic activity, Neuromuscular re-education, Balance training, Gait training, Patient/Family education, Self Care, Joint mobilization, Dry Needling, Electrical stimulation, Cryotherapy, Moist heat, Vasopneumatic device, Traction, Ionotophoresis '4mg'$ /ml Dexamethasone, and Manual therapy.  PLAN FOR NEXT SESSION: low back stretches, start with light gym activities, possibly moist heat for pain management   Andris Baumann, PT 03/09/2022, 5:19 PM

## 2022-03-14 ENCOUNTER — Encounter: Payer: Self-pay | Admitting: Neurology

## 2022-03-14 ENCOUNTER — Ambulatory Visit: Payer: Medicare Other | Admitting: Neurology

## 2022-03-14 ENCOUNTER — Encounter: Payer: Self-pay | Admitting: Physical Therapy

## 2022-03-14 ENCOUNTER — Ambulatory Visit: Payer: Medicare Other | Admitting: Physical Therapy

## 2022-03-14 VITALS — BP 126/71 | HR 78 | Ht 70.0 in | Wt 227.5 lb

## 2022-03-14 DIAGNOSIS — R269 Unspecified abnormalities of gait and mobility: Secondary | ICD-10-CM

## 2022-03-14 DIAGNOSIS — R42 Dizziness and giddiness: Secondary | ICD-10-CM

## 2022-03-14 DIAGNOSIS — G4733 Obstructive sleep apnea (adult) (pediatric): Secondary | ICD-10-CM | POA: Diagnosis not present

## 2022-03-14 DIAGNOSIS — R9082 White matter disease, unspecified: Secondary | ICD-10-CM | POA: Diagnosis not present

## 2022-03-14 DIAGNOSIS — R278 Other lack of coordination: Secondary | ICD-10-CM

## 2022-03-14 DIAGNOSIS — R252 Cramp and spasm: Secondary | ICD-10-CM

## 2022-03-14 DIAGNOSIS — Z9989 Dependence on other enabling machines and devices: Secondary | ICD-10-CM | POA: Diagnosis not present

## 2022-03-14 DIAGNOSIS — M5441 Lumbago with sciatica, right side: Secondary | ICD-10-CM

## 2022-03-14 DIAGNOSIS — M5459 Other low back pain: Secondary | ICD-10-CM

## 2022-03-14 NOTE — Progress Notes (Signed)
[facility]  PATIENT: [name] DOB: Mar 16, 1946  REFERRING DOCTOR OR PCP: Neldon Mc MD SOURCE: Patient, notes from primary care, imaging and lab results  _________________________________   HISTORICAL  CHIEF COMPLAINT:  Chief Complaint  Patient presents with   Follow-up    Rm 2, alone. Last seen 05/20/21 and here today to f/u. Pt reports gait has been better. Does have some occasional unsteadiness. Pt reports tingling in hands for years, has gotten used to it were he doesn't even know its there.     HISTORY OF PRESENT ILLNESS:  [name] is a 76 y.o. man with gait ataxia, and OSA on CPAP  Update  03/14/2022 He feels his gait is more balanced now than it was last year.  He has not had any falls.  The MRI of the brain 06/02/2021 shows stable T2/FLAIR hyperintense foci.  Although statistically these are more likely to represent chronic microvascular ischemic change, several of the foci are radially oriented to the ventricles and it is possible he has a very mild multiple sclerosis (his son is a patient of mine who has MS).  However, given that there was very little change over 10 years and he is 76 I do not think I would treat even if we were sure of the diagnosis.  Therefore, we will hold off on a lumbar puncture.  MRI of the cervical spine showed a normal spinal cord and some degenerative changes worse at C3-C4.  He has mre tingling in his hands, sometimes more noticaeable at noght  He has OSA and is on CPAP with good efficacy and compliance.    He has vivid dreams with rare  RBD like behavior (now usually just talks but had yelled out) He has no sleep paralysis.  He has mild EDS.   He never followed through with the PSG/MSLT.   He has mild EDS.     He sleeps well most nights.   He gets some deep sleep according to his watch.    In 1987, he had vertigo and was unable to stand.  His neurologist mentioned MS as a possibility.   He improved after a  month.   Vertigo returned in 2000 for a month and MS was again brought up but no firm diagnosis was made.   He again improved  He gets dizzy spells.  He does not get rotational vertigo but fels like he is pushed one way or the other.  .   Severe episodes lasting 20 seconds occur about 5-6/year.   He feels weak afterwards.  He does not feel he will pass out.    In 2012, he had an MRA showing dolichoectasia but no intracranial stenosis.    He has several vascular risks:  Hypertension, hyperlipidemia, smoking.   No DM.   He also has coronary artery disease (high calcium score) but has no angina.    Stress testes were fine in the past.    CT has also shown a mild thoracic aortic aneurysm (4.5) that is followed with serial scanning.     EPWORTH SLEEPINESS SCALE  On a scale of 0 - 3 what is the chance of dozing: 74  Sitting and Reading:   1 Watching TV:    1 Sitting inactive in a public place: 0 Passenger in car for one hour: 3 Lying down to rest in the afternoon: 3 Sitting and talking to someone: 0 Sitting quietly after lunch:  0 In a car, stopped in traffic:  0  Total (out of 24):   8/24   mild excessive sleepiness.       Images reviewed: MRI of the head and MR angiogram 03/06/2011 showed T2/flair hyperintense foci, many in the periventricular white matter, some radially oriented to the ventricles.  There was mild age-appropriate generalized cortical atrophy. 76 Dolichoectasia of the vertebrobasilar and carotid arteries near the skull base were noted.  The MRI of the brain 06/02/2021 shows T2/FLAIR hyperintense foci mildly progressed compared to the 2012 MRI.  Some of the foci are radially oriented to the ventricles the most are nonspecific.  MRI of the cervical spine 06/02/2021 showed a normal spinal cord and some degenerative changes worse at C3-C4.   REVIEW OF SYSTEMS: Constitutional: No fevers, chills, sweats, or change in appetite Eyes: No visual changes, double vision, eye pain Ear,  nose and throat: No hearing loss, ear pain, nasal congestion, sore throat Cardiovascular: No chest pain, palpitations Respiratory:  No shortness of breath at rest or with exertion.   No wheezes GastrointestinaI: No nausea, vomiting, diarrhea, abdominal pain, fecal incontinence Genitourinary:  No dysuria, urinary retention or frequency.  No nocturia. Musculoskeletal:  No neck pain, back pain Integumentary: No rash, pruritus, skin lesions Neurological: as above Psychiatric: No depression at this time.  No anxiety Endocrine: No palpitations, diaphoresis, change in appetite, change in weigh or increased thirst Hematologic/Lymphatic:  No anemia, purpura, petechiae. Allergic/Immunologic: No itchy/runny eyes, nasal congestion, recent allergic reactions, rashes  ALLERGIES: Allergies  Allergen Reactions   Aspirin Other (See Comments)    REACTION: gi upset with higher dose   Penicillins Itching    HOME MEDICATIONS:  Current Outpatient Medications:    ALPRAZolam (XANAX) 1 MG tablet, Take 1 tablet by mouth 2 (two) times daily., Disp: , Rfl:    Cholecalciferol (VITAMIN D3) 50 MCG (2000 UT) capsule, Take 2,000 Units by mouth daily., Disp: , Rfl:    co-enzyme Q-10 30 MG capsule, Take 30 mg by mouth every evening., Disp: , Rfl:    fluticasone (FLONASE) 50 MCG/ACT nasal spray, USE 2 SPRAYS IN EACH NOSTRIL EVERY NIGHT, Disp: 48 mL, Rfl: 6   hydrochlorothiazide (HYDRODIURIL) 25 MG tablet, Take 25 mg by mouth daily., Disp: , Rfl:    hydrocortisone 2.5 % cream, Apply 1 application topically 2 (two) times daily as needed., Disp: , Rfl:    Hypertonic Nasal Wash (SINUS RINSE BOTTLE KIT) PACK, Place into the nose at bedtime., Disp: , Rfl:    Lidocaine-Hydrocortisone Ace 3-2.5 % KIT, Place 1 application rectally daily as needed (hemorrhoids)., Disp: 1 kit, Rfl: 3   metoprolol tartrate (LOPRESSOR) 100 MG tablet, TAKE 1 TABLET BY MOUTH TWICE A DAY, Disp: 180 tablet, Rfl: 2   Multiple Vitamin (MULTIVITAMIN)  tablet, Take 1 tablet by mouth daily., Disp: , Rfl:    oxymetazoline (AFRIN) 0.05 % nasal spray, Place 2 sprays into the nose 2 (two) times daily., Disp: , Rfl:    pantoprazole (PROTONIX) 40 MG tablet, TAKE 1 TABLET BY MOUTH EVERY DAY, Disp: 90 tablet, Rfl: 3   ramipril (ALTACE) 10 MG capsule, Take 1 capsule (10 mg total) by mouth 2 (two) times daily. Patient must schedule appointment for future refills, Disp: 180 capsule, Rfl: 0   rivaroxaban (XARELTO) 20 MG TABS tablet, Take 20 mg by mouth daily., Disp: , Rfl:    rosuvastatin (CRESTOR) 20 MG tablet, Take 20 mg by mouth daily., Disp: , Rfl:    sertraline (ZOLOFT) 100 MG tablet, Take 100 mg by mouth daily., Disp: ,  Rfl:    traMADol (ULTRAM) 50 MG tablet, Take 1-2 tablets by mouth as needed., Disp: , Rfl:    Wheat Dextrin (BENEFIBER PO), Take 1 Dose by mouth 2 (two) times daily., Disp: , Rfl:   PAST MEDICAL HISTORY: Past Medical History:  Diagnosis Date   Antiphospholipid antibody syndrome ([place]) 05/22/2012   Found subsequent to recurrent PE 9/13   Arthritis    Bladder cancer ([place]) dx'd 2009   surg only   Chronic anal fissure    w/ recurrence's   Chronic anxiety    Depression    ED (erectile dysfunction)    Esophageal candidiasis ([place]) 03/14/2017   Fatty liver    GERD (gastroesophageal reflux disease)    Heart murmur    Hepatic steatosis    Hiatal hernia    History of adenomatous polyp of colon    2005   History of bladder cancer urologist-  dr Risa Grill   papillary TCC  s/p  turbt   History of DVT (deep vein thrombosis)    post prostate surgery 2010  right LLE   History of esophageal stricture    w/ dilation   History of gastric polyp    benign 2010   History of prostate cancer urologist- dr grapey/  oncology-  dr Alen Blew   Stage  T1c , Gleason 3+3;   s/p  prostatectomy 03-08-210//  per pt currently PSA nondetectable    History of pulmonary embolus (PE)    post prostate surgery 2010  and 2013 x2 right side per CT scan (found to  have lupus anticoaglated )   History of TIA (transient ischemic attack)    07/ 2012 noted on scans remote probable tia's in  age 53's   History of TMJ syndrome    HTN (hypertension)    Hyperlipidemia    Lupus anticoagulant positive    found 2013 when pt had PE   Moderate aortic regurgitation    with no  stenosis    OSA on CPAP    per study severe osa 12-14-2010   Prolapsed internal hemorrhoids, grade 2 03/31/2014   prostate ca dx'd 2010   surg only   RLS (restless legs syndrome)    RLS (restless legs syndrome)    Sleep apnea    Symptomatic PVCs cardiologist-  dr berry   intermittant   Wears glasses     PAST SURGICAL HISTORY: Past Surgical History:  Procedure Laterality Date   APPENDECTOMY  age 76   and Left Inguinal Hernia Repair   CARDIOVASCULAR STRESS TEST  06-30-2011   dr berry   normal nuclear study/  normal LV function and wall motion, ef 56%   COLONOSCOPY  last one 10-19-2011   CYSTO/ BLADDER BX/  RETROGRADE PYELOGRAM/  TRANSRECTAL PROSTATE BX'S  07-30-2008   ESOPHAGOGASTRODUODENOSCOPY  last one 06-27-2011   EVALUATION UNDER ANESTHESIA WITH FISTULECTOMY N/A 01/14/2016   Procedure: EXAM UNDER ANESTHESIA WITH REPAIR OF PERIRECTAL FISTULA  (LIFT);  Surgeon: Michael Boston, MD;  Location: [facility];  Service: General;  Laterality: N/A;   LAPAROSCOPIC CHOLECYSTECTOMY  05-25-2007   and Excision cyst back of neck   ROBOT ASSISTED LAPAROSCOPIC RADICAL PROSTATECTOMY  10-20-2008   STRABISMUS SURGERY Bilateral age 76   TONSILLECTOMY  age 59   TRANSTHORACIC ECHOCARDIOGRAM  03-23-2015   dr berry   grade 1 diastolic dysfunction, ef 67-67%/  mild AV thickened leaflets with no stenosis,  moderate AR/  mild ascending aorta ilatation3.9cm/  trivial MR and TR   UPPER  GASTROINTESTINAL ENDOSCOPY      FAMILY HISTORY: Family History  Problem Relation Age of Onset   Hypertension Mother    Hypertension Father    Stroke Father    Colon polyps Father    Colon cancer Father         questionable   Colon polyps Brother    Crohn's disease Brother    Diabetes Maternal Grandmother    Heart disease Maternal Grandmother    Cirrhosis Other        Great Uncle   Esophageal cancer Neg Hx    Rectal cancer Neg Hx    Stomach cancer Neg Hx     SOCIAL HISTORY:  Social History   Socioeconomic History   Marital status: Married    Spouse name: cathy   Number of children: 2   Years of education: Not on file   Highest education level: Not on file  Occupational History   Occupation: Art gallery manager   Occupation: [employer]    Employer: [employer]  Tobacco Use   Smoking status: Some Days    Types: Cigars   Smokeless tobacco: Never   Tobacco comments:    5 cigars /week--/  smoked cigarettes for 10 years quit 1990's  Vaping Use   Vaping Use: Never used  Substance and Sexual Activity   Alcohol use: Yes    Alcohol/week: 28.0 standard drinks of alcohol    Types: 14 Glasses of wine, 14 Cans of beer per week    Comment: 2 beers a day   Drug use: No   Sexual activity: Not on file  Other Topics Concern   Not on file  Social History Narrative   Married to [place])   Lives with wife   Gets regular exercise.   Daily caffeine- 2 cups daily.   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   General: The patient is well-developed and well-nourished and in no acute distress.   Pharynx is Mallampati 2  HEENT:  Head is [place]/AT.  Sclera are anicteric.    Skin: Extremities are without rash or  edema.  Musculoskeletal:  Back is nontender  Neurologic Exam  Mental status: The patient is alert and oriented x 3 at the time of the examination. The patient has apparent normal recent and remote memory, with an apparently normal attention span and concentration ability.   Speech is normal.  Cranial nerves: Extraocular movements show right exotropia (old).   ,  Color vision is mildly desaturated OS.  Facial symmetry is present.Reduced left face  sensation to soft touch.  Facial strength is normal.  Trapezius and sternocleidomastoid strength is normal. No dysarthria is noted.  Mild reduced left hearing and Weber lateralizes to the right.   Motor:  Muscle bulk is normal.   Tone is  normal. Strength is  5 / 5 in all 4 extremities.   Sensory: Sensory testing shpws reduced touch and vib on the left arm/leg  Coordination: Cerebellar testing reveals good finger-nose-finger and heel-to-shin bilaterally.  Gait and station: Station is normal.   Gait is mildly wide and turn is wide.  Tandem gait is wide but better than last visit.  He does not have a Romberg sign.  Reflexes: Deep tendon reflexes are symmetric and normal in rms and trace at knees.       DIAGNOSTIC DATA (LABS, IMAGING, TESTING) - I reviewed patient records, labs, notes, testing and imaging myself where available.  Lab Results  Component Value Date   WBC 5.1 07/23/2021   HGB 14.4 07/23/2021   HCT 42.6 07/23/2021   MCV 89.1 07/23/2021   PLT 155 07/23/2021      Component Value Date/Time   NA 143 07/23/2021 0915   NA 142 04/18/2018 1324   NA 140 07/14/2017 0817   K 3.5 07/23/2021 0915   K 3.8 07/14/2017 0817   CL 104 07/23/2021 0915   CL 103 11/02/2012 0737   CO2 29 07/23/2021 0915   CO2 28 07/14/2017 0817   GLUCOSE 100 (H) 07/23/2021 0915   GLUCOSE 104 07/14/2017 0817   GLUCOSE 95 11/02/2012 0737   BUN  13 09/29/2021 1536   BUN 12 04/18/2018 1324   BUN 16.8 07/14/2017 0817   CREATININE 1.02 09/29/2021 1536   CREATININE 1.09 07/23/2021 0915   CREATININE 1.1 07/14/2017 0817   CALCIUM 9.4 07/23/2021 0915   CALCIUM 9.4 07/14/2017 0817   PROT 7.0 07/23/2021 0915   PROT 7.0 05/14/2021 1106   PROT 7.1 07/14/2017 0817   ALBUMIN 4.4 07/23/2021 0915   ALBUMIN 4.7 05/14/2021 1106   ALBUMIN 4.1 07/14/2017 0817   AST 33 07/23/2021 0915   AST 35 (H) 07/14/2017 0817   ALT 46 (H) 07/23/2021 0915   ALT 57 (H) 07/14/2017 0817   ALKPHOS 60 07/23/2021 0915   ALKPHOS 67 07/14/2017 0817   BILITOT 1.1 07/23/2021 0915   BILITOT 0.60 07/14/2017 0817   GFRNONAA >60 07/23/2021 0915   GFRAA >60 07/26/2019 0818   Lab Results  Component Value Date   CHOL 137 05/14/2021   HDL 38 (L) 05/14/2021   LDLCALC 76 05/14/2021   TRIG 131 05/14/2021   CHOLHDL 3.6 05/14/2021   Lab Results  Component Value Date   HGBA1C 5.7 (H) 03/05/2011   Lab Results  Component Value Date   VITAMINB12 1030 (H) 12/12/2008   Lab Results  Component Value Date   TSH 0.764 03/30/2011       ASSESSMENT AND PLAN  Gait disturbance  White matter abnormality on MRI of brain  Dizzinesses  OSA on CPAP  MRI shows some white matter changes and some of the foci have an appearance that could be consistent with MS.  Of note, his son has MS.  We discussed that if he does have MS it is extremely mild as there were only minimal changes of the more nonspecific foci over 11 years on the most recent MRI.  Since stable, we will hold off on further diagnostic testing such as lumbar puncture.    Stay active and exercise as tolerated. Continue CPAP for OSA.  He has not had as much daytime sleepiness as he did earlier in the year and would like to hold off on the MSLT testing He will return to see [place] in 6 months or sooner if there are new  or worsening neurologic symptoms or based on the results of the studies.  Anastasiya Gowin A. Felecia Shelling, MD,  [facility] 5/67/2091, 9:80 PM Certified in Neurology, Clinical Neurophysiology, Sleep Medicine and Neuroimaging  [facility] [address] [place], [place] [address] [phone]

## 2022-03-14 NOTE — Therapy (Signed)
OUTPATIENT PHYSICAL THERAPY THORACOLUMBAR EVALUATION   Patient Name: [name] MRN: [id number] DOB:[birth year], 76 y.o., male Today's Date: 03/14/2022   PT End of Session - 03/14/22 1520     Visit Number 2    Date for PT Re-Evaluation 05/11/22    PT Start Time 1517    PT Stop Time 1600    PT Time Calculation (min) 43 min    Activity Tolerance Patient tolerated treatment well    Behavior During Therapy [facility] for tasks assessed/performed             Past Medical History:  Diagnosis Date   Antiphospholipid antibody syndrome ([place]) 05/22/2012   Found subsequent to recurrent PE 9/13   Arthritis    Bladder cancer ([place]) dx'd 2009   surg only   Chronic anal fissure    w/ recurrence's   Chronic anxiety    Depression    ED (erectile dysfunction)    Esophageal candidiasis ([place]) 03/14/2017   Fatty liver    GERD (gastroesophageal reflux disease)    Heart murmur    Hepatic steatosis    Hiatal hernia    History of adenomatous polyp of colon    2005   History of bladder cancer urologist-  dr Risa Grill   papillary TCC  s/p  turbt   History of DVT (deep vein thrombosis)    post prostate surgery 2010  right LLE   History of esophageal stricture    w/ dilation   History of gastric polyp    benign 2010   History of prostate cancer urologist- dr grapey/  oncology-  dr Alen Blew   Stage  T1c , Gleason 3+3;   s/p  prostatectomy 03-08-210//  per pt currently PSA nondetectable    History of pulmonary embolus (PE)    post prostate surgery 2010  and 2013 x2 right side per CT scan (found to have lupus anticoaglated )   History of TIA (transient ischemic attack)    07/ 2012 noted on scans remote probable tia's in  age 79's   History of TMJ syndrome    HTN (hypertension)    Hyperlipidemia    Lupus anticoagulant positive    found 2013 when pt had PE   Moderate aortic regurgitation    with no  stenosis    OSA on CPAP    per study severe osa 12-14-2010   Prolapsed internal hemorrhoids,  grade 2 03/31/2014   prostate ca dx'd 2010   surg only   RLS (restless legs syndrome)    RLS (restless legs syndrome)    Sleep apnea    Symptomatic PVCs cardiologist-  dr berry   intermittant   Wears glasses    Past Surgical History:  Procedure Laterality Date   APPENDECTOMY  age 57   and Left Inguinal Hernia Repair   CARDIOVASCULAR STRESS TEST  06-30-2011   dr berry   normal nuclear study/  normal LV function and wall motion, ef 56%   COLONOSCOPY  last one 10-19-2011   CYSTO/ BLADDER BX/  RETROGRADE PYELOGRAM/  TRANSRECTAL PROSTATE BX'S  07-30-2008   ESOPHAGOGASTRODUODENOSCOPY  last one 06-27-2011   EVALUATION UNDER ANESTHESIA WITH FISTULECTOMY N/A 01/14/2016   Procedure: EXAM UNDER ANESTHESIA WITH REPAIR OF PERIRECTAL FISTULA  (LIFT);  Surgeon: Michael Boston, MD;  Location: [facility];  Service: General;  Laterality: N/A;   LAPAROSCOPIC CHOLECYSTECTOMY  05-25-2007   and Excision cyst back of neck   ROBOT ASSISTED LAPAROSCOPIC RADICAL PROSTATECTOMY  10-20-2008   STRABISMUS  SURGERY Bilateral age 19   TONSILLECTOMY  age 16   TRANSTHORACIC ECHOCARDIOGRAM  03-23-2015   dr berry   grade 1 diastolic dysfunction, ef 98-33%/  mild AV thickened leaflets with no stenosis,  moderate AR/  mild ascending aorta ilatation3.9cm/  trivial MR and TR   UPPER GASTROINTESTINAL ENDOSCOPY     Patient Active Problem List   Diagnosis Date Noted   Gait disturbance 05/20/2021   Left sided numbness 05/20/2021   Dizzinesses 05/20/2021   Cataplexy 03/23/2021   Hypersomnia 03/23/2021   Other cervical disc degeneration, unspecified cervical region 03/23/2021   Episodic weakness 03/23/2021   Degenerative disc disease, lumbar 02/04/2021   Elevated coronary artery calcium score 12/23/2020   Thoracic aortic aneurysm ([place]) 04/05/2016   Aortic insufficiency 04/05/2016   Prolapsed internal hemorrhoids, grade 2 03/31/2014   Upper abdominal pain - chronic 03/31/2014   Dysphagia 03/31/2014    Antiphospholipid antibody syndrome ([place]) 05/22/2012   Pulmonary embolus ([place]) 11/11/2011   DVT, lower extremity, distal 11/11/2011   Family history of malignant neoplasm of gastrointestinal tract 10/18/2011   GERD (gastroesophageal reflux disease) 06/23/2011   IBS (irritable bowel syndrome) 06/23/2011   Premature ventricular contractions 06/22/2011   Malignant neoplasm of bladder ([place]) 12/12/2008   PROSTATE CANCER, HX OF 12/12/2008   PULMONARY EMBOLISM, HX OF 12/12/2008   HYPERLIPIDEMIA 01/05/2008   ANXIETY DEPRESSION 01/05/2008   Essential hypertension 01/05/2008   Hypertensive heart disease without heart failure 01/05/2008   CHOLELITHIASIS, ASYMPTOMATIC 01/05/2008   OSA on CPAP 82/50/5397   SYSTOLIC MURMUR 67/34/1937   Hx of adenomatous colonic polyps 10/14/2003    PCP: [name]  REFERRING PROVIDER: Sherene Sires  REFERRING DIAG: M54.41  Rationale for Evaluation and Treatment Rehabilitation  THERAPY DIAG:  Acute back pain with sciatica, right  Other low back pain  Cramp and spasm  Other lack of coordination  ONSET DATE: 01/26/22  SUBJECTIVE:                                                                                                                                                                                           SUBJECTIVE STATEMENT: Not feeling as bad today.   PERTINENT HISTORY:  Hx of prostate cancer, antiphospholipid syndrome, arthritis, hx of TIA, PE and DVT  PAIN:  Are you having pain? Yes: NPRS scale: 6/10 Pain location: R side back pain, radiates into R hip and groin  Pain description: dull, sharp Aggravating factors: turning, laughing, breathing too hard  Relieving factors: ice, heat, muscle relacers    PRECAUTIONS: Fall  WEIGHT BEARING RESTRICTIONS No  FALLS:  Has patient fallen in last 6 months? No  LIVING ENVIRONMENT: Lives with: lives with their spouse Lives in: House/apartment Stairs: No Has following equipment at home:  Single point cane and Environmental consultant - 2 wheeled  OCCUPATION: retired  PLOF: Independent  PATIENT GOALS get rid of pain    OBJECTIVE:   DIAGNOSTIC FINDINGS:  IMPRESSION: 1. Degenerative disc disease and facet degenerative changes as above. 2. Calcified atherosclerotic change in the abdominal aorta.  PATIENT SURVEYS:  FOTO 42/100  SCREENING FOR RED FLAGS: Bowel or bladder incontinence: No Spinal tumors: No Cauda equina syndrome: No Compression fracture: No Abdominal aneurysm: No  COGNITION:  Overall cognitive status: Within functional limits for tasks assessed     SENSATION: WFL  MUSCLE LENGTH: Hamstrings: tightness noted on BLE  POSTURE: rounded shoulders, forward head, decreased lumbar lordosis, and anterior pelvic tilt  PALPATION: TTP and sore slong L1-L5, R QL and lumbar paraspinal musculature, sore on R side 11-12th rib on back  LUMBAR ROM:   Active  A/PROM  eval  Flexion 75% w/pain  Extension 50% w/pain  Right lateral flexion Fib head w/pain  Left lateral flexion Fib head w/pain  Right rotation 25% w/pain  Left rotation 30% w/pain   (Blank rows = not tested)  LOWER EXTREMITY ROM:   grossly WFL   LOWER EXTREMITY MMT:    MMT Right eval Left eval  Hip flexion 4+ 4+  Hip extension 4 w/pain 4  Hip abduction 4 4  Hip adduction    Hip internal rotation 4+ 4+  Hip external rotation 4+ 4+  Knee flexion 5 5  Knee extension 5 5  Ankle dorsiflexion 5 5  Ankle plantarflexion 5 5  Ankle inversion    Ankle eversion     (Blank rows = not tested)  LUMBAR SPECIAL TESTS:  Straight leg raise test: Positive, Slump test: Positive, and FABER test: Positive  FUNCTIONAL TESTS:  5 times sit to stand: 15.74s Timed up and go (TUG): 9.84s  GAIT: Distance walked: in clinic distances Assistive device utilized: None Level of assistance: Complete Independence Comments: Feet externally rotated, slowed gait, decreased foot clearance and arm swing   TODAY'S TREATMENT   03/14/22 NuStep L5 x6 min  Shoulder Ext 10lb 2x10 Hamstring curls 25lb 2x10 Leg Ext 10lb 2x10 S2S OHP yellow ball 2x10  Rows & Lats 25lb 2x10     PATIENT EDUCATION:  Education details: POC and HEP Person educated: Patient Education method: Explanation Education comprehension: verbalized understanding   HOME EXERCISE PROGRAM: Access Code: [email or web address] URL: [email or web address] Date: 03/09/2022 Prepared by: Andris Baumann  Exercises - Prone Press Up On Elbows  - 2 x daily - 7 x weekly - 2 sets - 10 reps - Supine Lower Trunk Rotation  - 2 x daily - 7 x weekly - 2 sets - 10 reps - Supine Single Knee to Chest Stretch  - 2 x daily - 7 x weekly - 30 hold  ASSESSMENT:  CLINICAL IMPRESSION: Pt enters doing well with no reports of pain. He tolerated session well evident by so subjective reports of increase pain. Cue for posture and eccentric control during throughout session. Some trunk flexion noted with shoulder extensions despite cues. Some shoulder protraction present with rows.  REHAB POTENTIAL: Good  CLINICAL DECISION MAKING: Stable/uncomplicated  EVALUATION COMPLEXITY: Low   GOALS: Goals reviewed with patient? Yes  SHORT TERM GOALS: Target date: 04/13/22  Patient will be independent with initial HEP.  Goal status: INITIAL  2.  Patient will report centralization of radicular symptoms.  Baseline: radiates into hip and sometimes  down leg Goal status: INITIAL   LONG TERM GOALS: Target date: 05/11/22  Patient will be independent with advanced/ongoing HEP to improve outcomes and carryover.  Goal status: INITIAL  2.  Patient will report 75% improvement in low back pain to improve QOL.  Goal status: INITIAL  3.  Patient will demonstrate full pain free lumbar ROM to perform ADLs.   Goal status: INITIAL  5.  Patient will report 65 on lumbar FOTO to demonstrate improved functional ability.  Baseline: 42 Goal status: INITIAL      PLAN: PT FREQUENCY:  2x/week  PT DURATION: 8 weeks  PLANNED INTERVENTIONS: Therapeutic exercises, Therapeutic activity, Neuromuscular re-education, Balance training, Gait training, Patient/Family education, Self Care, Joint mobilization, Dry Needling, Electrical stimulation, Cryotherapy, Moist heat, Vasopneumatic device, Traction, Ionotophoresis '4mg'$ /ml Dexamethasone, and Manual therapy.  PLAN FOR NEXT SESSION: low back stretches, start with light gym activities, possibly moist heat for pain management   Scot Jun, PTA 03/14/2022, 3:21 PM

## 2022-03-21 ENCOUNTER — Ambulatory Visit: Payer: Medicare Other | Admitting: Physical Therapy

## 2022-03-24 ENCOUNTER — Ambulatory Visit: Payer: Medicare Other | Attending: Family Medicine

## 2022-03-24 DIAGNOSIS — M5459 Other low back pain: Secondary | ICD-10-CM | POA: Insufficient documentation

## 2022-03-24 DIAGNOSIS — R252 Cramp and spasm: Secondary | ICD-10-CM | POA: Diagnosis not present

## 2022-03-24 DIAGNOSIS — R278 Other lack of coordination: Secondary | ICD-10-CM | POA: Diagnosis not present

## 2022-03-24 DIAGNOSIS — M5441 Lumbago with sciatica, right side: Secondary | ICD-10-CM | POA: Diagnosis not present

## 2022-03-24 NOTE — Therapy (Addendum)
OUTPATIENT PHYSICAL THERAPY THORACOLUMBAR TREATMENT   Patient Name: [name] MRN: [id number] DOB:January 16, 1946, 76 y.o., male Today's Date: 03/24/2022   PT End of Session - 03/24/22 1501     Visit Number 3    Date for PT Re-Evaluation 05/11/22    PT Start Time 1500    PT Stop Time 1545    PT Time Calculation (min) 45 min    Activity Tolerance Patient tolerated treatment well    Behavior During Therapy [facility] for tasks assessed/performed              Past Medical History:  Diagnosis Date   Antiphospholipid antibody syndrome ([place]) 05/22/2012   Found subsequent to recurrent PE 9/13   Arthritis    Bladder cancer ([place]) dx'd 2009   surg only   Chronic anal fissure    w/ recurrence's   Chronic anxiety    Depression    ED (erectile dysfunction)    Esophageal candidiasis ([place]) 03/14/2017   Fatty liver    GERD (gastroesophageal reflux disease)    Heart murmur    Hepatic steatosis    Hiatal hernia    History of adenomatous polyp of colon    2005   History of bladder cancer urologist-  dr Risa Grill   papillary TCC  s/p  turbt   History of DVT (deep vein thrombosis)    post prostate surgery 2010  right LLE   History of esophageal stricture    w/ dilation   History of gastric polyp    benign 2010   History of prostate cancer urologist- dr grapey/  oncology-  dr Alen Blew   Stage  T1c , Gleason 3+3;   s/p  prostatectomy 03-08-210//  per pt currently PSA nondetectable    History of pulmonary embolus (PE)    post prostate surgery 2010  and 2013 x2 right side per CT scan (found to have lupus anticoaglated )   History of TIA (transient ischemic attack)    07/ 2012 noted on scans remote probable tia's in  age 70's   History of TMJ syndrome    HTN (hypertension)    Hyperlipidemia    Lupus anticoagulant positive    found 2013 when pt had PE   Moderate aortic regurgitation    with no  stenosis    OSA on CPAP    per study severe osa 12-14-2010   Prolapsed internal hemorrhoids,  grade 2 03/31/2014   prostate ca dx'd 2010   surg only   RLS (restless legs syndrome)    RLS (restless legs syndrome)    Sleep apnea    Symptomatic PVCs cardiologist-  dr berry   intermittant   Wears glasses    Past Surgical History:  Procedure Laterality Date   APPENDECTOMY  age 71   and Left Inguinal Hernia Repair   CARDIOVASCULAR STRESS TEST  06-30-2011   dr berry   normal nuclear study/  normal LV function and wall motion, ef 56%   COLONOSCOPY  last one 10-19-2011   CYSTO/ BLADDER BX/  RETROGRADE PYELOGRAM/  TRANSRECTAL PROSTATE BX'S  07-30-2008   ESOPHAGOGASTRODUODENOSCOPY  last one 06-27-2011   EVALUATION UNDER ANESTHESIA WITH FISTULECTOMY N/A 01/14/2016   Procedure: EXAM UNDER ANESTHESIA WITH REPAIR OF PERIRECTAL FISTULA  (LIFT);  Surgeon: Michael Boston, MD;  Location: [facility];  Service: General;  Laterality: N/A;   LAPAROSCOPIC CHOLECYSTECTOMY  05-25-2007   and Excision cyst back of neck   ROBOT ASSISTED LAPAROSCOPIC RADICAL PROSTATECTOMY  10-20-2008  STRABISMUS SURGERY Bilateral age 88   TONSILLECTOMY  age 63   TRANSTHORACIC ECHOCARDIOGRAM  03-23-2015   dr berry   grade 1 diastolic dysfunction, ef 49-44%/  mild AV thickened leaflets with no stenosis,  moderate AR/  mild ascending aorta ilatation3.9cm/  trivial MR and TR   UPPER GASTROINTESTINAL ENDOSCOPY     Patient Active Problem List   Diagnosis Date Noted   White matter abnormality on MRI of brain 03/14/2022   Gait disturbance 05/20/2021   Left sided numbness 05/20/2021   Dizzinesses 05/20/2021   Cataplexy 03/23/2021   Hypersomnia 03/23/2021   Other cervical disc degeneration, unspecified cervical region 03/23/2021   Episodic weakness 03/23/2021   Degenerative disc disease, lumbar 02/04/2021   Elevated coronary artery calcium score 12/23/2020   Thoracic aortic aneurysm ([place]) 04/05/2016   Aortic insufficiency 04/05/2016   Prolapsed internal hemorrhoids, grade 2 03/31/2014   Upper abdominal pain -  chronic 03/31/2014   Dysphagia 03/31/2014   Antiphospholipid antibody syndrome ([place]) 05/22/2012   Pulmonary embolus ([place]) 11/11/2011   DVT, lower extremity, distal 11/11/2011   Family history of malignant neoplasm of gastrointestinal tract 10/18/2011   GERD (gastroesophageal reflux disease) 06/23/2011   IBS (irritable bowel syndrome) 06/23/2011   Premature ventricular contractions 06/22/2011   Malignant neoplasm of bladder ([place]) 12/12/2008   PROSTATE CANCER, HX OF 12/12/2008   PULMONARY EMBOLISM, HX OF 12/12/2008   HYPERLIPIDEMIA 01/05/2008   ANXIETY DEPRESSION 01/05/2008   Essential hypertension 01/05/2008   Hypertensive heart disease without heart failure 01/05/2008   CHOLELITHIASIS, ASYMPTOMATIC 01/05/2008   OSA on CPAP 96/75/9163   SYSTOLIC MURMUR 84/66/5993   Hx of adenomatous colonic polyps 10/14/2003    PCP: [name]  REFERRING PROVIDER: Sherene Sires  REFERRING DIAG: M54.41  Rationale for Evaluation and Treatment Rehabilitation  THERAPY DIAG:  Acute back pain with sciatica, right  Other low back pain  Other lack of coordination  Cramp and spasm  ONSET DATE: 01/26/22  SUBJECTIVE:                                                                                                                                                                                           SUBJECTIVE STATEMENT: Doing alright, having some pain in my right side and up into my shoulder blade.   PERTINENT HISTORY:  Hx of prostate cancer, antiphospholipid syndrome, arthritis, hx of TIA, PE and DVT  PAIN:  Are you having pain? Yes: NPRS scale: 6/10 Pain location: R side back pain, radiates into R hip and groin  Pain description: dull, sharp Aggravating factors: turning, laughing, breathing too hard  Relieving factors: ice, heat, muscle relacers  PRECAUTIONS: Fall  WEIGHT BEARING RESTRICTIONS No  FALLS:  Has patient fallen in last 6 months? No  LIVING ENVIRONMENT: Lives with:  lives with their spouse Lives in: House/apartment Stairs: No Has following equipment at home: Single point cane and Environmental consultant - 2 wheeled  OCCUPATION: retired  PLOF: Independent  PATIENT GOALS get rid of pain    OBJECTIVE:   DIAGNOSTIC FINDINGS:  IMPRESSION: 1. Degenerative disc disease and facet degenerative changes as above. 2. Calcified atherosclerotic change in the abdominal aorta.  PATIENT SURVEYS:  FOTO 42/100  SCREENING FOR RED FLAGS: Bowel or bladder incontinence: No Spinal tumors: No Cauda equina syndrome: No Compression fracture: No Abdominal aneurysm: No  COGNITION:  Overall cognitive status: Within functional limits for tasks assessed     SENSATION: WFL  MUSCLE LENGTH: Hamstrings: tightness noted on BLE  POSTURE: rounded shoulders, forward head, decreased lumbar lordosis, and anterior pelvic tilt  PALPATION: TTP and sore slong L1-L5, R QL and lumbar paraspinal musculature, sore on R side 11-12th rib on back  LUMBAR ROM:   Active  A/PROM  eval  Flexion 75% w/pain  Extension 50% w/pain  Right lateral flexion Fib head w/pain  Left lateral flexion Fib head w/pain  Right rotation 25% w/pain  Left rotation 30% w/pain   (Blank rows = not tested)  LOWER EXTREMITY ROM:   grossly WFL   LOWER EXTREMITY MMT:    MMT Right eval Left eval  Hip flexion 4+ 4+  Hip extension 4 w/pain 4  Hip abduction 4 4  Hip adduction    Hip internal rotation 4+ 4+  Hip external rotation 4+ 4+  Knee flexion 5 5  Knee extension 5 5  Ankle dorsiflexion 5 5  Ankle plantarflexion 5 5  Ankle inversion    Ankle eversion     (Blank rows = not tested)  LUMBAR SPECIAL TESTS:  Straight leg raise test: Positive, Slump test: Positive, and FABER test: Positive  FUNCTIONAL TESTS:  5 times sit to stand: 15.74s Timed up and go (TUG): 9.84s  GAIT: Distance walked: in clinic distances Assistive device utilized: None Level of assistance: Complete Independence Comments:  Feet externally rotated, slowed gait, decreased foot clearance and arm swing   TODAY'S TREATMENT  03/24/22 Nustep L5 x20mns Resisted gait 40# 4 way x3 each Lat pulls downs 25# 2x10 Seated rows 25# 2x10  Shoulder ext 20# 2x10  Leg ext 25# 2x10 HS curls 45# 2x10 Step ups 6" forwards and side steps 2x10  Leg press 80# 2x10    03/14/22  NuStep L5 x6 min  Shoulder Ext 10lb 2x10 Hamstring curls 25lb 2x10 Leg Ext 10lb 2x10 S2S OHP yellow ball 2x10  Rows & Lats 25lb 2x10     PATIENT EDUCATION:  Education details: POC and HEP Person educated: Patient Education method: Explanation Education comprehension: verbalized understanding   HOME EXERCISE PROGRAM: Access Code: [email or web address]: [email or web address] Date: 03/09/2022 Prepared by: MAndris Baumann Exercises - Prone Press Up On Elbows  - 2 x daily - 7 x weekly - 2 sets - 10 reps - Supine Lower Trunk Rotation  - 2 x daily - 7 x weekly - 2 sets - 10 reps - Supine Single Knee to Chest Stretch  - 2 x daily - 7 x weekly - 30 hold  ASSESSMENT:  CLINICAL IMPRESSION: Patient states he is doing well and having some back pain but that is normal. We continued working on low back and LE strengthening today. He tolerates heavy weights today with no  complaints. He states he enjoyed the resisted gait and he likes how it helps him with his walking. Some LOB with resisted gait and step ups but able to regain balance independently.   REHAB POTENTIAL: Good  CLINICAL DECISION MAKING: Stable/uncomplicated  EVALUATION COMPLEXITY: Low   GOALS: Goals reviewed with patient? Yes  SHORT TERM GOALS: Target date: 04/13/22  Patient will be independent with initial HEP.  Goal status: INITIAL  2.  Patient will report centralization of radicular symptoms.  Baseline: radiates into hip and sometimes down leg Goal status: INITIAL   LONG TERM GOALS: Target date: 05/11/22  Patient will be independent with advanced/ongoing HEP to improve  outcomes and carryover.  Goal status: INITIAL  2.  Patient will report 75% improvement in low back pain to improve QOL.  Goal status: INITIAL  3.  Patient will demonstrate full pain free lumbar ROM to perform ADLs.   Goal status: INITIAL  5.  Patient will report 35 on lumbar FOTO to demonstrate improved functional ability.  Baseline: 42 Goal status: INITIAL      PLAN: PT FREQUENCY: 2x/week  PT DURATION: 8 weeks  PLANNED INTERVENTIONS: Therapeutic exercises, Therapeutic activity, Neuromuscular re-education, Balance training, Gait training, Patient/Family education, Self Care, Joint mobilization, Dry Needling, Electrical stimulation, Cryotherapy, Moist heat, Vasopneumatic device, Traction, Ionotophoresis '4mg'$ /ml Dexamethasone, and Manual therapy.  PLAN FOR NEXT SESSION: low back stretches, start with light gym activities, possibly moist heat for pain management   Andris Baumann, PT 03/24/2022, 3:48 PM

## 2022-03-28 ENCOUNTER — Ambulatory Visit: Payer: Medicare Other | Admitting: Physical Therapy

## 2022-03-31 ENCOUNTER — Ambulatory Visit: Payer: Medicare Other | Admitting: Physical Therapy

## 2022-03-31 ENCOUNTER — Encounter: Payer: Self-pay | Admitting: Physical Therapy

## 2022-03-31 DIAGNOSIS — R278 Other lack of coordination: Secondary | ICD-10-CM

## 2022-03-31 DIAGNOSIS — R252 Cramp and spasm: Secondary | ICD-10-CM

## 2022-03-31 DIAGNOSIS — M5459 Other low back pain: Secondary | ICD-10-CM | POA: Diagnosis not present

## 2022-03-31 DIAGNOSIS — M5441 Lumbago with sciatica, right side: Secondary | ICD-10-CM

## 2022-03-31 NOTE — Therapy (Signed)
OUTPATIENT PHYSICAL THERAPY THORACOLUMBAR TREATMENT   Patient Name: [name] MRN: [id number] DOB:1945/11/26, 76 y.o., male Today's Date: 03/31/2022   PT End of Session - 03/31/22 1520     Visit Number 4    Date for PT Re-Evaluation 05/11/22    PT Start Time 1519    PT Stop Time 1600    PT Time Calculation (min) 41 min    Activity Tolerance Patient tolerated treatment well    Behavior During Therapy [facility] for tasks assessed/performed              Past Medical History:  Diagnosis Date   Antiphospholipid antibody syndrome ([place]) 05/22/2012   Found subsequent to recurrent PE 9/13   Arthritis    Bladder cancer ([place]) dx'd 2009   surg only   Chronic anal fissure    w/ recurrence's   Chronic anxiety    Depression    ED (erectile dysfunction)    Esophageal candidiasis ([place]) 03/14/2017   Fatty liver    GERD (gastroesophageal reflux disease)    Heart murmur    Hepatic steatosis    Hiatal hernia    History of adenomatous polyp of colon    2005   History of bladder cancer urologist-  dr Risa Grill   papillary TCC  s/p  turbt   History of DVT (deep vein thrombosis)    post prostate surgery 2010  right LLE   History of esophageal stricture    w/ dilation   History of gastric polyp    benign 2010   History of prostate cancer urologist- dr grapey/  oncology-  dr Alen Blew   Stage  T1c , Gleason 3+3;   s/p  prostatectomy 03-08-210//  per pt currently PSA nondetectable    History of pulmonary embolus (PE)    post prostate surgery 2010  and 2013 x2 right side per CT scan (found to have lupus anticoaglated )   History of TIA (transient ischemic attack)    07/ 2012 noted on scans remote probable tia's in  age 81's   History of TMJ syndrome    HTN (hypertension)    Hyperlipidemia    Lupus anticoagulant positive    found 2013 when pt had PE   Moderate aortic regurgitation    with no  stenosis    OSA on CPAP    per study severe osa 12-14-2010   Prolapsed internal hemorrhoids,  grade 2 03/31/2014   prostate ca dx'd 2010   surg only   RLS (restless legs syndrome)    RLS (restless legs syndrome)    Sleep apnea    Symptomatic PVCs cardiologist-  dr berry   intermittant   Wears glasses    Past Surgical History:  Procedure Laterality Date   APPENDECTOMY  age 57   and Left Inguinal Hernia Repair   CARDIOVASCULAR STRESS TEST  06-30-2011   dr berry   normal nuclear study/  normal LV function and wall motion, ef 56%   COLONOSCOPY  last one 10-19-2011   CYSTO/ BLADDER BX/  RETROGRADE PYELOGRAM/  TRANSRECTAL PROSTATE BX'S  07-30-2008   ESOPHAGOGASTRODUODENOSCOPY  last one 06-27-2011   EVALUATION UNDER ANESTHESIA WITH FISTULECTOMY N/A 01/14/2016   Procedure: EXAM UNDER ANESTHESIA WITH REPAIR OF PERIRECTAL FISTULA  (LIFT);  Surgeon: Michael Boston, MD;  Location: [facility];  Service: General;  Laterality: N/A;   LAPAROSCOPIC CHOLECYSTECTOMY  05-25-2007   and Excision cyst back of neck   ROBOT ASSISTED LAPAROSCOPIC RADICAL PROSTATECTOMY  10-20-2008  STRABISMUS SURGERY Bilateral age 43   TONSILLECTOMY  age 37   TRANSTHORACIC ECHOCARDIOGRAM  03-23-2015   dr berry   grade 1 diastolic dysfunction, ef 15-17%/  mild AV thickened leaflets with no stenosis,  moderate AR/  mild ascending aorta ilatation3.9cm/  trivial MR and TR   UPPER GASTROINTESTINAL ENDOSCOPY     Patient Active Problem List   Diagnosis Date Noted   White matter abnormality on MRI of brain 03/14/2022   Gait disturbance 05/20/2021   Left sided numbness 05/20/2021   Dizzinesses 05/20/2021   Cataplexy 03/23/2021   Hypersomnia 03/23/2021   Other cervical disc degeneration, unspecified cervical region 03/23/2021   Episodic weakness 03/23/2021   Degenerative disc disease, lumbar 02/04/2021   Elevated coronary artery calcium score 12/23/2020   Thoracic aortic aneurysm ([place]) 04/05/2016   Aortic insufficiency 04/05/2016   Prolapsed internal hemorrhoids, grade 2 03/31/2014   Upper abdominal pain -  chronic 03/31/2014   Dysphagia 03/31/2014   Antiphospholipid antibody syndrome ([place]) 05/22/2012   Pulmonary embolus ([place]) 11/11/2011   DVT, lower extremity, distal 11/11/2011   Family history of malignant neoplasm of gastrointestinal tract 10/18/2011   GERD (gastroesophageal reflux disease) 06/23/2011   IBS (irritable bowel syndrome) 06/23/2011   Premature ventricular contractions 06/22/2011   Malignant neoplasm of bladder ([place]) 12/12/2008   PROSTATE CANCER, HX OF 12/12/2008   PULMONARY EMBOLISM, HX OF 12/12/2008   HYPERLIPIDEMIA 01/05/2008   ANXIETY DEPRESSION 01/05/2008   Essential hypertension 01/05/2008   Hypertensive heart disease without heart failure 01/05/2008   CHOLELITHIASIS, ASYMPTOMATIC 01/05/2008   OSA on CPAP 61/60/7371   SYSTOLIC MURMUR 02/08/9484   Hx of adenomatous colonic polyps 10/14/2003    PCP: [name]  REFERRING PROVIDER: Sherene Sires  REFERRING DIAG: M54.41  Rationale for Evaluation and Treatment Rehabilitation  THERAPY DIAG:  Acute back pain with sciatica, right  Other low back pain  Other lack of coordination  Cramp and spasm  ONSET DATE: 01/26/22  SUBJECTIVE:                                                                                                                                                                                           SUBJECTIVE STATEMENT: Better, its helping  PERTINENT HISTORY:  Hx of prostate cancer, antiphospholipid syndrome, arthritis, hx of TIA, PE and DVT  PAIN:  Are you having pain? Yes: NPRS scale: 4/10 Pain location: R side back pain, radiates into R hip and groin  Pain description: dull, sharp Aggravating factors: turning, laughing, breathing too hard  Relieving factors: ice, heat, muscle relacers    PRECAUTIONS: Fall  WEIGHT BEARING RESTRICTIONS No  FALLS:  Has  patient fallen in last 6 months? No  LIVING ENVIRONMENT: Lives with: lives with their spouse Lives in: House/apartment Stairs:  No Has following equipment at home: Single point cane and Environmental consultant - 2 wheeled  OCCUPATION: retired  PLOF: Independent  PATIENT GOALS get rid of pain    OBJECTIVE:   DIAGNOSTIC FINDINGS:  IMPRESSION: 1. Degenerative disc disease and facet degenerative changes as above. 2. Calcified atherosclerotic change in the abdominal aorta.  PATIENT SURVEYS:  FOTO 42/100  SCREENING FOR RED FLAGS: Bowel or bladder incontinence: No Spinal tumors: No Cauda equina syndrome: No Compression fracture: No Abdominal aneurysm: No  COGNITION:  Overall cognitive status: Within functional limits for tasks assessed     SENSATION: WFL  MUSCLE LENGTH: Hamstrings: tightness noted on BLE  POSTURE: rounded shoulders, forward head, decreased lumbar lordosis, and anterior pelvic tilt  PALPATION: TTP and sore slong L1-L5, R QL and lumbar paraspinal musculature, sore on R side 11-12th rib on back  LUMBAR ROM:   Active  A/PROM  eval  Flexion 75% w/pain  Extension 50% w/pain  Right lateral flexion Fib head w/pain  Left lateral flexion Fib head w/pain  Right rotation 25% w/pain  Left rotation 30% w/pain   (Blank rows = not tested)  LOWER EXTREMITY ROM:   grossly WFL   LOWER EXTREMITY MMT:    MMT Right eval Left eval  Hip flexion 4+ 4+  Hip extension 4 w/pain 4  Hip abduction 4 4  Hip adduction    Hip internal rotation 4+ 4+  Hip external rotation 4+ 4+  Knee flexion 5 5  Knee extension 5 5  Ankle dorsiflexion 5 5  Ankle plantarflexion 5 5  Ankle inversion    Ankle eversion     (Blank rows = not tested)  LUMBAR SPECIAL TESTS:  Straight leg raise test: Positive, Slump test: Positive, and FABER test: Positive  FUNCTIONAL TESTS:  5 times sit to stand: 15.74s Timed up and go (TUG): 9.84s  GAIT: Distance walked: in clinic distances Assistive device utilized: None Level of assistance: Complete Independence Comments: Feet externally rotated, slowed gait, decreased foot clearance  and arm swing   TODAY'S TREATMENT  03/31/22 Recumbent Bike L3 x3 min NuStep L5 x x4 min S2S OHP blue ball 2x10 AR press 20lb 2x10 Hamstring curls 35lb 2x10 Leg Ext 10lb 2x10 Rows and Lats 35lb 2x10   03/24/22 Nustep L5 x35mns Resisted gait 40# 4 way x3 each Lat pulls downs 25# 2x10 Seated rows 25# 2x10  Shoulder ext 20# 2x10  Leg ext 25# 2x10 HS curls 45# 2x10 Step ups 6" forwards and side steps 2x10  Leg press 80# 2x10    03/14/22  NuStep L5 x6 min  Shoulder Ext 10lb 2x10 Hamstring curls 25lb 2x10 Leg Ext 10lb 2x10 S2S OHP yellow ball 2x10  Rows & Lats 25lb 2x10     PATIENT EDUCATION:  Education details: POC and HEP Person educated: Patient Education method: Explanation Education comprehension: verbalized understanding   HOME EXERCISE PROGRAM: Access Code: [email or web address]: [email or web address] Date: 03/09/2022 Prepared by: MAndris Baumann Exercises - Prone Press Up On Elbows  - 2 x daily - 7 x weekly - 2 sets - 10 reps - Supine Lower Trunk Rotation  - 2 x daily - 7 x weekly - 2 sets - 10 reps - Supine Single Knee to Chest Stretch  - 2 x daily - 7 x weekly - 30 hold  ASSESSMENT:  CLINICAL IMPRESSION: Patient states he is doing well and having  some back pain but that is normal. Session focused on functional core strength. Sone fatigue present with sit to stands. Increase resistance tolerated with machine level interventions.  Core weakness present with anti rotational presses.   REHAB POTENTIAL: Good  CLINICAL DECISION MAKING: Stable/uncomplicated  EVALUATION COMPLEXITY: Low   GOALS: Goals reviewed with patient? Yes  SHORT TERM GOALS: Target date: 04/13/22  Patient will be independent with initial HEP.  Goal status: met  2.  Patient will report centralization of radicular symptoms.  Baseline: radiates into hip and sometimes down leg Goal status: INITIAL   LONG TERM GOALS: Target date: 05/11/22  Patient will be independent with  advanced/ongoing HEP to improve outcomes and carryover.  Goal status: INITIAL  2.  Patient will report 75% improvement in low back pain to improve QOL.  Goal status: INITIAL  3.  Patient will demonstrate full pain free lumbar ROM to perform ADLs.   Goal status: INITIAL  5.  Patient will report 73 on lumbar FOTO to demonstrate improved functional ability.  Baseline: 42 Goal status: INITIAL      PLAN: PT FREQUENCY: 2x/week  PT DURATION: 8 weeks  PLANNED INTERVENTIONS: Therapeutic exercises, Therapeutic activity, Neuromuscular re-education, Balance training, Gait training, Patient/Family education, Self Care, Joint mobilization, Dry Needling, Electrical stimulation, Cryotherapy, Moist heat, Vasopneumatic device, Traction, Ionotophoresis 78m/ml Dexamethasone, and Manual therapy.  PLAN FOR NEXT SESSION: low back stretches, start with light gym activities, possibly moist heat for pain management   RScot Jun PTA 03/31/2022, 3:21 PM

## 2022-04-04 ENCOUNTER — Ambulatory Visit: Payer: Medicare Other | Admitting: Physical Therapy

## 2022-04-07 ENCOUNTER — Ambulatory Visit: Payer: Medicare Other

## 2022-04-07 DIAGNOSIS — R252 Cramp and spasm: Secondary | ICD-10-CM | POA: Diagnosis not present

## 2022-04-07 DIAGNOSIS — M5459 Other low back pain: Secondary | ICD-10-CM

## 2022-04-07 DIAGNOSIS — R278 Other lack of coordination: Secondary | ICD-10-CM | POA: Diagnosis not present

## 2022-04-07 DIAGNOSIS — M5441 Lumbago with sciatica, right side: Secondary | ICD-10-CM

## 2022-04-07 NOTE — Therapy (Signed)
OUTPATIENT PHYSICAL THERAPY THORACOLUMBAR TREATMENT   Patient Name: [name] MRN: [id number] DOB:1946/02/15, 76 y.o., male Today's Date: 04/07/2022   PT End of Session - 04/07/22 1506     Visit Number 5    Date for PT Re-Evaluation 05/11/22    PT Start Time 4270    PT Stop Time 6237    PT Time Calculation (min) 40 min    Activity Tolerance Patient tolerated treatment well    Behavior During Therapy [facility] for tasks assessed/performed               Past Medical History:  Diagnosis Date   Antiphospholipid antibody syndrome ([place]) 05/22/2012   Found subsequent to recurrent PE 9/13   Arthritis    Bladder cancer ([place]) dx'd 2009   surg only   Chronic anal fissure    w/ recurrence's   Chronic anxiety    Depression    ED (erectile dysfunction)    Esophageal candidiasis ([place]) 03/14/2017   Fatty liver    GERD (gastroesophageal reflux disease)    Heart murmur    Hepatic steatosis    Hiatal hernia    History of adenomatous polyp of colon    2005   History of bladder cancer urologist-  dr Risa Grill   papillary TCC  s/p  turbt   History of DVT (deep vein thrombosis)    post prostate surgery 2010  right LLE   History of esophageal stricture    w/ dilation   History of gastric polyp    benign 2010   History of prostate cancer urologist- dr grapey/  oncology-  dr Alen Blew   Stage  T1c , Gleason 3+3;   s/p  prostatectomy 03-08-210//  per pt currently PSA nondetectable    History of pulmonary embolus (PE)    post prostate surgery 2010  and 2013 x2 right side per CT scan (found to have lupus anticoaglated )   History of TIA (transient ischemic attack)    07/ 2012 noted on scans remote probable tia's in  age 25's   History of TMJ syndrome    HTN (hypertension)    Hyperlipidemia    Lupus anticoagulant positive    found 2013 when pt had PE   Moderate aortic regurgitation    with no  stenosis    OSA on CPAP    per study severe osa 12-14-2010   Prolapsed internal  hemorrhoids, grade 2 03/31/2014   prostate ca dx'd 2010   surg only   RLS (restless legs syndrome)    RLS (restless legs syndrome)    Sleep apnea    Symptomatic PVCs cardiologist-  dr berry   intermittant   Wears glasses    Past Surgical History:  Procedure Laterality Date   APPENDECTOMY  age 20   and Left Inguinal Hernia Repair   CARDIOVASCULAR STRESS TEST  06-30-2011   dr berry   normal nuclear study/  normal LV function and wall motion, ef 56%   COLONOSCOPY  last one 10-19-2011   CYSTO/ BLADDER BX/  RETROGRADE PYELOGRAM/  TRANSRECTAL PROSTATE BX'S  07-30-2008   ESOPHAGOGASTRODUODENOSCOPY  last one 06-27-2011   EVALUATION UNDER ANESTHESIA WITH FISTULECTOMY N/A 01/14/2016   Procedure: EXAM UNDER ANESTHESIA WITH REPAIR OF PERIRECTAL FISTULA  (LIFT);  Surgeon: Michael Boston, MD;  Location: [facility];  Service: General;  Laterality: N/A;   LAPAROSCOPIC CHOLECYSTECTOMY  05-25-2007   and Excision cyst back of neck   ROBOT ASSISTED LAPAROSCOPIC RADICAL PROSTATECTOMY  10-20-2008  STRABISMUS SURGERY Bilateral age 82   TONSILLECTOMY  age 47   TRANSTHORACIC ECHOCARDIOGRAM  03-23-2015   dr berry   grade 1 diastolic dysfunction, ef 27-03%/  mild AV thickened leaflets with no stenosis,  moderate AR/  mild ascending aorta ilatation3.9cm/  trivial MR and TR   UPPER GASTROINTESTINAL ENDOSCOPY     Patient Active Problem List   Diagnosis Date Noted   White matter abnormality on MRI of brain 03/14/2022   Gait disturbance 05/20/2021   Left sided numbness 05/20/2021   Dizzinesses 05/20/2021   Cataplexy 03/23/2021   Hypersomnia 03/23/2021   Other cervical disc degeneration, unspecified cervical region 03/23/2021   Episodic weakness 03/23/2021   Degenerative disc disease, lumbar 02/04/2021   Elevated coronary artery calcium score 12/23/2020   Thoracic aortic aneurysm ([place]) 04/05/2016   Aortic insufficiency 04/05/2016   Prolapsed internal hemorrhoids, grade 2 03/31/2014   Upper  abdominal pain - chronic 03/31/2014   Dysphagia 03/31/2014   Antiphospholipid antibody syndrome ([place]) 05/22/2012   Pulmonary embolus ([place]) 11/11/2011   DVT, lower extremity, distal 11/11/2011   Family history of malignant neoplasm of gastrointestinal tract 10/18/2011   GERD (gastroesophageal reflux disease) 06/23/2011   IBS (irritable bowel syndrome) 06/23/2011   Premature ventricular contractions 06/22/2011   Malignant neoplasm of bladder ([place]) 12/12/2008   PROSTATE CANCER, HX OF 12/12/2008   PULMONARY EMBOLISM, HX OF 12/12/2008   HYPERLIPIDEMIA 01/05/2008   ANXIETY DEPRESSION 01/05/2008   Essential hypertension 01/05/2008   Hypertensive heart disease without heart failure 01/05/2008   CHOLELITHIASIS, ASYMPTOMATIC 01/05/2008   OSA on CPAP 50/04/3817   SYSTOLIC MURMUR 29/93/7169   Hx of adenomatous colonic polyps 10/14/2003    PCP: [name]  REFERRING PROVIDER: Sherene Sires  REFERRING DIAG: M54.41  Rationale for Evaluation and Treatment Rehabilitation  THERAPY DIAG:  Acute back pain with sciatica, right  Other low back pain  Other lack of coordination  Cramp and spasm  ONSET DATE: 01/26/22  SUBJECTIVE:                                                                                                                                                                                           SUBJECTIVE STATEMENT: Not bad, doing pretty good. My low back is not bad at all but there is one spot on my right side that hurts.    PERTINENT HISTORY:  Hx of prostate cancer, antiphospholipid syndrome, arthritis, hx of TIA, PE and DVT  PAIN:  Are you having pain? Yes: NPRS scale: 4/10 Pain location: R side back pain, radiates into R hip and groin  Pain description: dull, sharp Aggravating factors: turning, laughing, breathing  too hard  Relieving factors: ice, heat, muscle relacers    PRECAUTIONS: Fall  WEIGHT BEARING RESTRICTIONS No  FALLS:  Has patient fallen in last 6  months? No  LIVING ENVIRONMENT: Lives with: lives with their spouse Lives in: House/apartment Stairs: No Has following equipment at home: Single point cane and Environmental consultant - 2 wheeled  OCCUPATION: retired  PLOF: Independent  PATIENT GOALS get rid of pain    OBJECTIVE:   DIAGNOSTIC FINDINGS:  IMPRESSION: 1. Degenerative disc disease and facet degenerative changes as above. 2. Calcified atherosclerotic change in the abdominal aorta.  PATIENT SURVEYS:  FOTO 42/100  SCREENING FOR RED FLAGS: Bowel or bladder incontinence: No Spinal tumors: No Cauda equina syndrome: No Compression fracture: No Abdominal aneurysm: No  COGNITION:  Overall cognitive status: Within functional limits for tasks assessed     SENSATION: WFL  MUSCLE LENGTH: Hamstrings: tightness noted on BLE  POSTURE: rounded shoulders, forward head, decreased lumbar lordosis, and anterior pelvic tilt  PALPATION: TTP and sore slong L1-L5, R QL and lumbar paraspinal musculature, sore on R side 11-12th rib on back  LUMBAR ROM:   Active  A/PROM  eval  Flexion 75% w/pain  Extension 50% w/pain  Right lateral flexion Fib head w/pain  Left lateral flexion Fib head w/pain  Right rotation 25% w/pain  Left rotation 30% w/pain   (Blank rows = not tested)  LOWER EXTREMITY ROM:   grossly WFL   LOWER EXTREMITY MMT:    MMT Right eval Left eval  Hip flexion 4+ 4+  Hip extension 4 w/pain 4  Hip abduction 4 4  Hip adduction    Hip internal rotation 4+ 4+  Hip external rotation 4+ 4+  Knee flexion 5 5  Knee extension 5 5  Ankle dorsiflexion 5 5  Ankle plantarflexion 5 5  Ankle inversion    Ankle eversion     (Blank rows = not tested)  LUMBAR SPECIAL TESTS:  Straight leg raise test: Positive, Slump test: Positive, and FABER test: Positive  FUNCTIONAL TESTS:  5 times sit to stand: 15.74s Timed up and go (TUG): 9.84s  GAIT: Distance walked: in clinic distances Assistive device utilized: None Level of  assistance: Complete Independence Comments: Feet externally rotated, slowed gait, decreased foot clearance and arm swing   TODAY'S TREATMENT  04/07/22 Nustep L5 x28mns  [place] press 20# 2x10 BlackTB crunches 2x10 BlackTB ext 2x10 Rows 35# 2x10 Lats 35# 2x10 Stretches HS, SK2C, piriformis  Feet on ball K2C, rotation, and bridges  03/31/22 Recumbent Bike L3 x3 min NuStep L5 x x4 min S2S OHP blue ball 2x10 AR press 20lb 2x10 Hamstring curls 35lb 2x10 Leg Ext 10lb 2x10 Rows and Lats 35lb 2x10   03/24/22 Nustep L5 x68ms Resisted gait 40# 4 way x3 each Lat pulls downs 25# 2x10 Seated rows 25# 2x10  Shoulder ext 20# 2x10  Leg ext 25# 2x10 HS curls 45# 2x10 Step ups 6" forwards and side steps 2x10  Leg press 80# 2x10    03/14/22  NuStep L5 x6 min  Shoulder Ext 10lb 2x10 Hamstring curls 25lb 2x10 Leg Ext 10lb 2x10 S2S OHP yellow ball 2x10  Rows & Lats 25lb 2x10     PATIENT EDUCATION:  Education details: POC and HEP Person educated: Patient Education method: Explanation Education comprehension: verbalized understanding   HOME EXERCISE PROGRAM: Access Code: [email or web address]: [email or web address] Date: 03/09/2022 Prepared by: MoAndris BaumannExercises - Prone Press Up On Elbows  - 2 x daily - 7 x weekly -  2 sets - 10 reps - Supine Lower Trunk Rotation  - 2 x daily - 7 x weekly - 2 sets - 10 reps - Supine Single Knee to Chest Stretch  - 2 x daily - 7 x weekly - 30 hold  ASSESSMENT:  CLINICAL IMPRESSION: Patient states he is doing well and having some back pain but mostly just one spot on the R side. He tried to lift a heavy object that was 90lb and thinks that messed him up. Session focused on functional core and back strength. Ended session with some gentle stretching following exercises with weights. Tolerates session well and enjoys exercises today.    REHAB POTENTIAL: Good  CLINICAL DECISION MAKING: Stable/uncomplicated  EVALUATION COMPLEXITY:  Low   GOALS: Goals reviewed with patient? Yes  SHORT TERM GOALS: Target date: 04/13/22  Patient will be independent with initial HEP.  Goal status: met  2.  Patient will report centralization of radicular symptoms.  Baseline: radiates into hip and sometimes down leg Goal status: INITIAL   LONG TERM GOALS: Target date: 05/11/22  Patient will be independent with advanced/ongoing HEP to improve outcomes and carryover.  Goal status: INITIAL  2.  Patient will report 75% improvement in low back pain to improve QOL.  Goal status: INITIAL  3.  Patient will demonstrate full pain free lumbar ROM to perform ADLs.   Goal status: INITIAL  5.  Patient will report 44 on lumbar FOTO to demonstrate improved functional ability.  Baseline: 42 Goal status: INITIAL      PLAN: PT FREQUENCY: 2x/week  PT DURATION: 8 weeks  PLANNED INTERVENTIONS: Therapeutic exercises, Therapeutic activity, Neuromuscular re-education, Balance training, Gait training, Patient/Family education, Self Care, Joint mobilization, Dry Needling, Electrical stimulation, Cryotherapy, Moist heat, Vasopneumatic device, Traction, Ionotophoresis 22m/ml Dexamethasone, and Manual therapy.  PLAN FOR NEXT SESSION: low back stretches, start with light gym activities, possibly moist heat for pain management   MAndris Baumann PT 04/07/2022, 3:45 PM

## 2022-04-08 DIAGNOSIS — D225 Melanocytic nevi of trunk: Secondary | ICD-10-CM | POA: Diagnosis not present

## 2022-04-08 DIAGNOSIS — B078 Other viral warts: Secondary | ICD-10-CM | POA: Diagnosis not present

## 2022-04-08 DIAGNOSIS — Z1283 Encounter for screening for malignant neoplasm of skin: Secondary | ICD-10-CM | POA: Diagnosis not present

## 2022-04-08 DIAGNOSIS — D485 Neoplasm of uncertain behavior of skin: Secondary | ICD-10-CM | POA: Diagnosis not present

## 2022-04-08 DIAGNOSIS — L820 Inflamed seborrheic keratosis: Secondary | ICD-10-CM | POA: Diagnosis not present

## 2022-04-08 DIAGNOSIS — D2262 Melanocytic nevi of left upper limb, including shoulder: Secondary | ICD-10-CM | POA: Diagnosis not present

## 2022-04-11 ENCOUNTER — Ambulatory Visit: Payer: Medicare Other | Admitting: Physical Therapy

## 2022-04-14 ENCOUNTER — Encounter: Payer: Self-pay | Admitting: Physical Therapy

## 2022-04-14 ENCOUNTER — Ambulatory Visit: Payer: Medicare Other | Admitting: Physical Therapy

## 2022-04-14 DIAGNOSIS — R278 Other lack of coordination: Secondary | ICD-10-CM

## 2022-04-14 DIAGNOSIS — M5459 Other low back pain: Secondary | ICD-10-CM

## 2022-04-14 DIAGNOSIS — M5441 Lumbago with sciatica, right side: Secondary | ICD-10-CM | POA: Diagnosis not present

## 2022-04-14 DIAGNOSIS — R252 Cramp and spasm: Secondary | ICD-10-CM | POA: Diagnosis not present

## 2022-04-14 NOTE — Therapy (Signed)
OUTPATIENT PHYSICAL THERAPY THORACOLUMBAR TREATMENT   Patient Name: [name] MRN: [id number] DOB:Jul 20, 1946, 76 y.o., male Today's Date: 04/14/2022   PT End of Session - 04/14/22 1515     Visit Number 6    Authorization Type [employer] Medicare    Progress Note Due on Visit 5830    PT Stop Time 1600    Activity Tolerance Patient tolerated treatment well    Behavior During Therapy [facility] for tasks assessed/performed               Past Medical History:  Diagnosis Date   Antiphospholipid antibody syndrome ([place]) 05/22/2012   Found subsequent to recurrent PE 9/13   Arthritis    Bladder cancer ([place]) dx'd 2009   surg only   Chronic anal fissure    w/ recurrence's   Chronic anxiety    Depression    ED (erectile dysfunction)    Esophageal candidiasis ([place]) 03/14/2017   Fatty liver    GERD (gastroesophageal reflux disease)    Heart murmur    Hepatic steatosis    Hiatal hernia    History of adenomatous polyp of colon    2005   History of bladder cancer urologist-  dr Risa Grill   papillary TCC  s/p  turbt   History of DVT (deep vein thrombosis)    post prostate surgery 2010  right LLE   History of esophageal stricture    w/ dilation   History of gastric polyp    benign 2010   History of prostate cancer urologist- dr grapey/  oncology-  dr Alen Blew   Stage  T1c , Gleason 3+3;   s/p  prostatectomy 03-08-210//  per pt currently PSA nondetectable    History of pulmonary embolus (PE)    post prostate surgery 2010  and 2013 x2 right side per CT scan (found to have lupus anticoaglated )   History of TIA (transient ischemic attack)    07/ 2012 noted on scans remote probable tia's in  age 76's   History of TMJ syndrome    HTN (hypertension)    Hyperlipidemia    Lupus anticoagulant positive    found 2013 when pt had PE   Moderate aortic regurgitation    with no  stenosis    OSA on CPAP    per study severe osa 12-14-2010   Prolapsed internal hemorrhoids, grade 2 03/31/2014    prostate ca dx'd 2010   surg only   RLS (restless legs syndrome)    RLS (restless legs syndrome)    Sleep apnea    Symptomatic PVCs cardiologist-  dr berry   intermittant   Wears glasses    Past Surgical History:  Procedure Laterality Date   APPENDECTOMY  age 43   and Left Inguinal Hernia Repair   CARDIOVASCULAR STRESS TEST  06-30-2011   dr berry   normal nuclear study/  normal LV function and wall motion, ef 56%   COLONOSCOPY  last one 10-19-2011   CYSTO/ BLADDER BX/  RETROGRADE PYELOGRAM/  TRANSRECTAL PROSTATE BX'S  07-30-2008   ESOPHAGOGASTRODUODENOSCOPY  last one 06-27-2011   EVALUATION UNDER ANESTHESIA WITH FISTULECTOMY N/A 01/14/2016   Procedure: EXAM UNDER ANESTHESIA WITH REPAIR OF PERIRECTAL FISTULA  (LIFT);  Surgeon: Michael Boston, MD;  Location: [facility];  Service: General;  Laterality: N/A;   LAPAROSCOPIC CHOLECYSTECTOMY  05-25-2007   and Excision cyst back of neck   ROBOT ASSISTED LAPAROSCOPIC RADICAL PROSTATECTOMY  10-20-2008   STRABISMUS SURGERY Bilateral age 44  TONSILLECTOMY  age 58   TRANSTHORACIC ECHOCARDIOGRAM  03-23-2015   dr berry   grade 1 diastolic dysfunction, ef 26-33%/  mild AV thickened leaflets with no stenosis,  moderate AR/  mild ascending aorta ilatation3.9cm/  trivial MR and TR   UPPER GASTROINTESTINAL ENDOSCOPY     Patient Active Problem List   Diagnosis Date Noted   White matter abnormality on MRI of brain 03/14/2022   Gait disturbance 05/20/2021   Left sided numbness 05/20/2021   Dizzinesses 05/20/2021   Cataplexy 03/23/2021   Hypersomnia 03/23/2021   Other cervical disc degeneration, unspecified cervical region 03/23/2021   Episodic weakness 03/23/2021   Degenerative disc disease, lumbar 02/04/2021   Elevated coronary artery calcium score 12/23/2020   Thoracic aortic aneurysm ([place]) 04/05/2016   Aortic insufficiency 04/05/2016   Prolapsed internal hemorrhoids, grade 2 03/31/2014   Upper abdominal pain - chronic 03/31/2014    Dysphagia 03/31/2014   Antiphospholipid antibody syndrome ([place]) 05/22/2012   Pulmonary embolus ([place]) 11/11/2011   DVT, lower extremity, distal 11/11/2011   Family history of malignant neoplasm of gastrointestinal tract 10/18/2011   GERD (gastroesophageal reflux disease) 06/23/2011   IBS (irritable bowel syndrome) 06/23/2011   Premature ventricular contractions 06/22/2011   Malignant neoplasm of bladder ([place]) 12/12/2008   PROSTATE CANCER, HX OF 12/12/2008   PULMONARY EMBOLISM, HX OF 12/12/2008   HYPERLIPIDEMIA 01/05/2008   ANXIETY DEPRESSION 01/05/2008   Essential hypertension 01/05/2008   Hypertensive heart disease without heart failure 01/05/2008   CHOLELITHIASIS, ASYMPTOMATIC 01/05/2008   OSA on CPAP 35/45/6256   SYSTOLIC MURMUR 38/93/7342   Hx of adenomatous colonic polyps 10/14/2003    PCP: [name]  REFERRING PROVIDER: Sherene Sires  REFERRING DIAG: M54.41  Rationale for Evaluation and Treatment Rehabilitation  THERAPY DIAG:  Acute back pain with sciatica, right  Other low back pain  Other lack of coordination  Cramp and spasm  ONSET DATE: 01/26/22  SUBJECTIVE:                                                                                                                                                                                           SUBJECTIVE STATEMENT: Overall not bad, about the same   PERTINENT HISTORY:  Hx of prostate cancer, antiphospholipid syndrome, arthritis, hx of TIA, PE and DVT  PAIN:  Are you having pain? Yes: NPRS scale: 5/10 Pain location: R side back pain, radiates into R hip and groin  Pain description: dull, sharp Aggravating factors: turning, laughing, breathing too hard  Relieving factors: ice, heat, muscle relacers    PRECAUTIONS: Fall  WEIGHT BEARING RESTRICTIONS No  FALLS:  Has patient fallen in  last 6 months? No  LIVING ENVIRONMENT: Lives with: lives with their spouse Lives in: House/apartment Stairs: No Has  following equipment at home: Single point cane and Environmental consultant - 2 wheeled  OCCUPATION: retired  PLOF: Independent  PATIENT GOALS get rid of pain    OBJECTIVE:   DIAGNOSTIC FINDINGS:  IMPRESSION: 1. Degenerative disc disease and facet degenerative changes as above. 2. Calcified atherosclerotic change in the abdominal aorta.  PATIENT SURVEYS:  FOTO 42/100  SCREENING FOR RED FLAGS: Bowel or bladder incontinence: No Spinal tumors: No Cauda equina syndrome: No Compression fracture: No Abdominal aneurysm: No  COGNITION:  Overall cognitive status: Within functional limits for tasks assessed     SENSATION: WFL  MUSCLE LENGTH: Hamstrings: tightness noted on BLE  POSTURE: rounded shoulders, forward head, decreased lumbar lordosis, and anterior pelvic tilt  PALPATION: TTP and sore slong L1-L5, R QL and lumbar paraspinal musculature, sore on R side 11-12th rib on back  LUMBAR ROM:   Active  A/PROM  eval  Flexion 75% w/pain  Extension 50% w/pain  Right lateral flexion Fib head w/pain  Left lateral flexion Fib head w/pain  Right rotation 25% w/pain  Left rotation 30% w/pain   (Blank rows = not tested)  LOWER EXTREMITY ROM:   grossly WFL   LOWER EXTREMITY MMT:    MMT Right eval Left eval  Hip flexion 4+ 4+  Hip extension 4 w/pain 4  Hip abduction 4 4  Hip adduction    Hip internal rotation 4+ 4+  Hip external rotation 4+ 4+  Knee flexion 5 5  Knee extension 5 5  Ankle dorsiflexion 5 5  Ankle plantarflexion 5 5  Ankle inversion    Ankle eversion     (Blank rows = not tested)  LUMBAR SPECIAL TESTS:  Straight leg raise test: Positive, Slump test: Positive, and FABER test: Positive  FUNCTIONAL TESTS:  5 times sit to stand: 15.74s Timed up and go (TUG): 9.84s  GAIT: Distance walked: in clinic distances Assistive device utilized: None Level of assistance: Complete Independence Comments: Feet externally rotated, slowed gait, decreased foot clearance and arm  swing   TODAY'S TREATMENT  04/14/22 NuStep L5 x 6 min  S2S OHP yellow ball 2x10 Shoulder Ext 15lb 2x10  Hamstring curls 35lb 2x10  Leg Ext 15lb 2x10 Bridges x10 LE on Pball bridges, K2c, Oblq  Le stretches HS, piriformis, lower trunk rotation   04/07/22 Nustep L5 x68mns  Pallof press 20# 2x10 BlackTB crunches 2x10 BlackTB ext 2x10 Rows 35# 2x10 Lats 35# 2x10 Stretches HS, SK2C, piriformis  Feet on ball K2C, rotation, and bridges  03/31/22 Recumbent Bike L3 x3 min NuStep L5 x x4 min S2S OHP blue ball 2x10 AR press 20lb 2x10 Hamstring curls 35lb 2x10 Leg Ext 10lb 2x10 Rows and Lats 35lb 2x10   03/24/22 Nustep L5 x661ms Resisted gait 40# 4 way x3 each Lat pulls downs 25# 2x10 Seated rows 25# 2x10  Shoulder ext 20# 2x10  Leg ext 25# 2x10 HS curls 45# 2x10 Step ups 6" forwards and side steps 2x10  Leg press 80# 2x10    03/14/22  NuStep L5 x6 min  Shoulder Ext 10lb 2x10 Hamstring curls 25lb 2x10 Leg Ext 10lb 2x10 S2S OHP yellow ball 2x10  Rows & Lats 25lb 2x10     PATIENT EDUCATION:  Education details: POC and HEP Person educated: Patient Education method: Explanation Education comprehension: verbalized understanding   HOME EXERCISE PROGRAM: Access Code: [email or web address]: [email or web address] Date: 03/09/2022 Prepared by: MoAndris Baumann  Exercises - Prone Press Up On Elbows  - 2 x daily - 7 x weekly - 2 sets - 10 reps - Supine Lower Trunk Rotation  - 2 x daily - 7 x weekly - 2 sets - 10 reps - Supine Single Knee to Chest Stretch  - 2 x daily - 7 x weekly - 30 hold  ASSESSMENT:  CLINICAL IMPRESSION: Patient states he is doing well overall but is still having some back pain but mostly just one spot on the R side. He has very tight hamstrings and lower lumbar. Some postural weakness noted with shoulder extensions. Increase lumbar instability with LE on pball. Cues for full ROM with LE extensions.   REHAB POTENTIAL: Good  CLINICAL DECISION  MAKING: Stable/uncomplicated  EVALUATION COMPLEXITY: Low  iGOALS: Goals reviewed with patient? Yes  SHORT TERM GOALS: Target date: 04/13/22  Patient will be independent with initial HEP.  Goal status: met  2.  Patient will report centralization of radicular symptoms.  Baseline: radiates into hip and sometimes down leg Goal status: INITIAL   LONG TERM GOALS: Target date: 05/11/22  Patient will be independent with advanced/ongoing HEP to improve outcomes and carryover.  Goal status: INITIAL  2.  Patient will report 75% improvement in low back pain to improve QOL.  Goal status: INITIAL  3.  Patient will demonstrate full pain free lumbar ROM to perform ADLs.   Goal status: INITIAL  5.  Patient will report 21 on lumbar FOTO to demonstrate improved functional ability.  Baseline: 42 Goal status: INITIAL      PLAN: PT FREQUENCY: 2x/week  PT DURATION: 8 weeks  PLANNED INTERVENTIONS: Therapeutic exercises, Therapeutic activity, Neuromuscular re-education, Balance training, Gait training, Patient/Family education, Self Care, Joint mobilization, Dry Needling, Electrical stimulation, Cryotherapy, Moist heat, Vasopneumatic device, Traction, Ionotophoresis 44m/ml Dexamethasone, and Manual therapy.  PLAN FOR NEXT SESSION: low back stretches, start with light gym activities, possibly moist heat for pain management   RScot Jun PTA 04/14/2022, 3:15 PM

## 2022-04-15 ENCOUNTER — Ambulatory Visit (HOSPITAL_COMMUNITY): Payer: Medicare Other | Attending: Cardiovascular Disease

## 2022-04-15 DIAGNOSIS — I351 Nonrheumatic aortic (valve) insufficiency: Secondary | ICD-10-CM | POA: Insufficient documentation

## 2022-04-15 DIAGNOSIS — I10 Essential (primary) hypertension: Secondary | ICD-10-CM | POA: Diagnosis not present

## 2022-04-15 DIAGNOSIS — I2699 Other pulmonary embolism without acute cor pulmonale: Secondary | ICD-10-CM | POA: Diagnosis not present

## 2022-04-15 LAB — ECHOCARDIOGRAM COMPLETE
Area-P 1/2: 4.49 cm2
P 1/2 time: 930 msec
S' Lateral: 3.5 cm

## 2022-04-20 DIAGNOSIS — L988 Other specified disorders of the skin and subcutaneous tissue: Secondary | ICD-10-CM | POA: Diagnosis not present

## 2022-04-20 DIAGNOSIS — D485 Neoplasm of uncertain behavior of skin: Secondary | ICD-10-CM | POA: Diagnosis not present

## 2022-04-21 ENCOUNTER — Ambulatory Visit: Payer: Medicare Other

## 2022-04-26 ENCOUNTER — Ambulatory Visit (INDEPENDENT_AMBULATORY_CARE_PROVIDER_SITE_OTHER): Payer: Medicare Other | Admitting: Family Medicine

## 2022-04-26 VITALS — BP 146/86 | HR 70 | Ht 70.0 in | Wt 229.0 lb

## 2022-04-26 DIAGNOSIS — M5441 Lumbago with sciatica, right side: Secondary | ICD-10-CM

## 2022-04-26 DIAGNOSIS — R109 Unspecified abdominal pain: Secondary | ICD-10-CM | POA: Diagnosis not present

## 2022-04-26 DIAGNOSIS — M5414 Radiculopathy, thoracic region: Secondary | ICD-10-CM | POA: Diagnosis not present

## 2022-04-26 NOTE — Patient Instructions (Signed)
Thank you for coming in today.   You should hear from MRI scheduling within 1 week. If you do not hear please let me know.    Recheck after MRI

## 2022-04-26 NOTE — Progress Notes (Signed)
I, Peterson Lombard, LAT, ATC acting as a scribe for [name], MD.  [name] is a 76 y.o. male who presents to [place] at [facility] today for f/u R knee, R lateral hip pain, and LBP. Pt was last seen by Dr. Georgina Snell on 02/21/22 and was referred to PT, completing 6 visits. Today, pt reports he stopped PT, because it wasn't helpful and sometimes even worsened his pain. Pt c/o cont'd R thoracic back pain radiating into the RUQ and ribcage and intermittently down the lateral aspect of the R leg. Pt feels like the R knee is better, only slight pain sometimes.   Dx imaging: 02/21/22 L-spine XR  11/24/20 L-spine & sacrum/coccyx XR  11/09/21 R knee XR (done @ [name] office)  Pertinent review of systems: No fevers or chills  Relevant historical information: History of a pulmonary embolism. History of bladder cancer. Antiphospholipid antibody syndrome.   Exam:  BP (!) 146/86   Pulse 70   Ht '5\' 10"'$  (1.778 m)   Wt 229 lb (103.9 kg)   SpO2 96%   BMI 32.86 kg/m  General: Well Developed, well nourished, and in no acute distress.   MSK:  T-spine: Nontender midline.  Tender palpation right lower thoracic paraspinal musculature. Pain with right lateral flexion T-spine.  This motion reproduces pain radiating around the lateral flank. L-spine nontender midline.  Decreased lumbar motion.  Lower extremity strength is intact.  Right hip normal-appearing nontender normal hip abduction strength.    Lab and Radiology Results  CT scan images of the T-spine and L-spine available on CT scan abdomen and pelvis obtained February 2023 personally independently interpreted showing no fracture or severe degenerative changes.     Assessment and Plan: 76 y.o. male with persistent chronic right mid to low thoracic back pain and upper lumbar back pain with some pain radiating around the right flank and a T7 thoracic radicular dermatomal pattern and a L5 lumbar radicular pattern. He  is failing conservative management including physical therapy.  Additionally has a cancer history.  Plan for MRI T-spine and L-spine.  Check back after MRI results are back to over the results in full detail and the plan for neck steps including potential injections.   PDMP not reviewed this encounter. Orders Placed This Encounter  Procedures   MR THORACIC SPINE WO CONTRAST    Standing Status:   Future    Standing Expiration Date:   04/27/2023    Order Specific Question:   What is the patient's sedation requirement?    Answer:   No Sedation    Order Specific Question:   Does the patient have a pacemaker or implanted devices?    Answer:   No    Order Specific Question:   Preferred imaging location?    Answer:   [facility] (table limit-550lbs)   MR Lumbar Spine Wo Contrast    Standing Status:   Future    Standing Expiration Date:   04/27/2023    Order Specific Question:   What is the patient's sedation requirement?    Answer:   No Sedation    Order Specific Question:   Does the patient have a pacemaker or implanted devices?    Answer:   No    Order Specific Question:   Preferred imaging location?    Answer:   [facility] (table limit-550lbs)   No orders of the defined types were placed in this encounter.    Discussed warning signs  or symptoms. Please see discharge instructions. Patient expresses understanding.   The above documentation has been reviewed and is accurate and complete [name], M.D.

## 2022-04-28 ENCOUNTER — Ambulatory Visit: Payer: Medicare Other

## 2022-05-05 ENCOUNTER — Ambulatory Visit: Payer: Medicare Other | Admitting: Physical Therapy

## 2022-05-11 ENCOUNTER — Ambulatory Visit: Payer: Medicare Other | Admitting: Physical Therapy

## 2022-05-13 ENCOUNTER — Other Ambulatory Visit: Payer: Self-pay | Admitting: Cardiovascular Disease

## 2022-05-17 ENCOUNTER — Ambulatory Visit
Admission: RE | Admit: 2022-05-17 | Discharge: 2022-05-17 | Disposition: A | Discharge status: Home / Self Care | Payer: Medicare Other | Source: Ambulatory Visit | Attending: Family Medicine | Admitting: Family Medicine

## 2022-05-17 DIAGNOSIS — M545 Low back pain, unspecified: Secondary | ICD-10-CM | POA: Diagnosis not present

## 2022-05-17 DIAGNOSIS — M549 Dorsalgia, unspecified: Secondary | ICD-10-CM | POA: Diagnosis not present

## 2022-05-17 DIAGNOSIS — M5441 Lumbago with sciatica, right side: Secondary | ICD-10-CM

## 2022-05-17 DIAGNOSIS — R109 Unspecified abdominal pain: Secondary | ICD-10-CM

## 2022-05-17 DIAGNOSIS — M5414 Radiculopathy, thoracic region: Secondary | ICD-10-CM

## 2022-05-17 DIAGNOSIS — M48061 Spinal stenosis, lumbar region without neurogenic claudication: Secondary | ICD-10-CM | POA: Diagnosis not present

## 2022-05-20 ENCOUNTER — Encounter: Payer: Self-pay | Admitting: Family Medicine

## 2022-05-20 NOTE — Progress Notes (Signed)
Lumbar spine MRI shows arthritis changes in your back which could cause back pain.  Additionally you have the potential for pinched nerve causing pain going down your right leg.  Recommend return to clinic to go over the results in full detail and talk about treatment plan and options including potentially injections.

## 2022-05-20 NOTE — Progress Notes (Signed)
Thoracic spine MRI does not show a pinched nerve in your thoracic spine like I was concerned there could be.  We do see the muscles in your back are atrophied and weak which is probably the source of your pain.  Recommend return to clinic to go over the results in full detail and to discuss treatment plan and options.

## 2022-05-21 ENCOUNTER — Other Ambulatory Visit: Payer: Self-pay | Admitting: Internal Medicine

## 2022-05-23 ENCOUNTER — Ambulatory Visit: Payer: Medicare Other | Admitting: Family Medicine

## 2022-05-23 VITALS — BP 162/88 | HR 68 | Ht 70.0 in | Wt 235.0 lb

## 2022-05-23 DIAGNOSIS — M791 Myalgia, unspecified site: Secondary | ICD-10-CM | POA: Diagnosis not present

## 2022-05-23 DIAGNOSIS — T466X5A Adverse effect of antihyperlipidemic and antiarteriosclerotic drugs, initial encounter: Secondary | ICD-10-CM | POA: Insufficient documentation

## 2022-05-23 DIAGNOSIS — R109 Unspecified abdominal pain: Secondary | ICD-10-CM | POA: Diagnosis not present

## 2022-05-23 NOTE — Progress Notes (Signed)
I, [name], LAT, ATC acting as a scribe for Andrew Leader, MD.  [name] is a 76 y.o. male who presents to [place] at [facility] today for continued thoracic and lumbar back pain with radicular symptoms along the T7 and L5 pattern.  Patient was last seen by Dr. Georgina Snell on 04/26/2022 and was advised to proceed to MRI.  Patient completed prior course of physical therapy, completing 6 visits.  Today, patient reports he decreased his rosuvastatin from '20mg'$  to '5mg'$ . Pt feels like he is doing better and has been wearing a back support brace.   Dx imaging: 05/17/22 lumbar and thoracic MRI 02/21/22 L-spine XR             11/24/20 L-spine & sacrum/coccyx XR             11/09/21 R knee XR (done @ [name] office)  Pertinent review of systems: No fevers or chills  Relevant historical information: History of a pulmonary embolus   Exam:  BP (!) 162/88   Pulse 68   Ht '5\' 10"'$  (1.778 m)   Wt 235 lb (106.6 kg)   SpO2 96%   BMI 33.72 kg/m  General: Well Developed, well nourished, and in no acute distress.   MSK: T and L-spine nontender midline.  Normal motion.    Lab and Radiology Results   EXAM: MRI THORACIC AND LUMBAR SPINE WITHOUT CONTRAST   TECHNIQUE: Multiplanar and multiecho pulse sequences of the thoracic and lumbar spine were obtained without intravenous contrast.   COMPARISON:  None Available.   FINDINGS: MRI THORACIC SPINE FINDINGS   Alignment:  Negative for listhesis.   Vertebrae: No fracture, evidence of discitis, or bone lesion.   Cord:  Normal signal and morphology.   Paraspinal and other soft tissues: Atrophy of intrinsic back muscles   Disc levels:   Spondylitic endplate spurring at the mid to lower thoracic levels. Facet spurring on the right at T2-3 where there is moderate foraminal stenosis.   MRI LUMBAR SPINE FINDINGS   Segmentation:  Standard.   Alignment:  Mild dextroscoliosis.  L5-S1 mild anterolisthesis.   Vertebrae:   No fracture, evidence of discitis, or bone lesion.   Conus medullaris and cauda equina: Conus extends to the L2 level. Conus and cauda equina appear normal.   Paraspinal and other soft tissues: Atrophy of intrinsic back muscles.   Disc levels:   T12- L1: Unremarkable.   L1-L2: Unremarkable.   L2-L3: Disc space narrowing and desiccation with bulge. Ventral spondylitic spurring   L3-L4: Disc space narrowing and bulging with ventral spondylitic spurring.   L4-L5: Mild disc bulging.   L5-S1:Advanced facet osteoarthritis with bulky spurring and mild anterolisthesis. Mild disc bulging. There is an anterior synovial cyst on the right which is noncompressive, 5 mm. Right foraminal narrowing is mild.   IMPRESSION: MR THORACIC SPINE IMPRESSION   1. Ordinary spondylosis with diffusely patent spinal canal. 2. Mild generalized facet spurring with up to moderate foraminal narrowing on the right at T2-3. No foraminal impingement at the T7 and adjacent levels.   MR LUMBAR SPINE IMPRESSION   1. Lumbar spine degeneration especially affecting the L5-S1 facets with mild anterolisthesis. Mild scoliosis. 2. Diffusely patent canal and foramina.     Electronically Signed   By: Jorje Guild M.D.   On: 05/20/2022 06:32 I, [name], personally (independently) visualized and performed the interpretation of the images attached in this note.      Assessment and Plan: 76 y.o.  male with chronic back pain.  It seems that the pain was probably due to statin myalgia.  His pain has reduced significantly after cutting his rosuvastatin dose from 20 to 5 mg.  He does have degenerative changes in both his thoracic and lumbar spine on MRI but his significant improvement in pain upon reduction of statin indicates that his pain is probably due to the statins.  Additionally his muscle atrophy and thoracic spine probably indicate some muscle dysfunction pathway as well.  Plan continue lower dose of  rosuvastatin for few more weeks then try increasing the dose again.  If his pain returns we can reliably assume that the rosuvastatin is to blame.  He should be switched very likely to a different cholesterol medication such as Pitavastatin.  We will leave this to his primary care provider.  Zipitamag is available at [facility] affordably.     Discussed warning signs or symptoms. Please see discharge instructions. Patient expresses understanding.   The above documentation has been reviewed and is accurate and complete [name], M.D.

## 2022-05-23 NOTE — Patient Instructions (Addendum)
Thank you for coming in today.   Continue reduced dose of Crestor.  After a few weeks increase the dose and see if the pain returns.   If it dose return you should be off crestor or a reduced dose.   I would recommend Zypitamag form [facility].

## 2022-05-30 ENCOUNTER — Encounter: Payer: Self-pay | Admitting: Family Medicine

## 2022-06-01 DIAGNOSIS — Z8551 Personal history of malignant neoplasm of bladder: Secondary | ICD-10-CM | POA: Diagnosis not present

## 2022-07-22 ENCOUNTER — Inpatient Hospital Stay: Payer: Medicare Other | Attending: Oncology

## 2022-07-22 ENCOUNTER — Encounter: Payer: Self-pay | Admitting: Oncology

## 2022-07-22 DIAGNOSIS — Z7901 Long term (current) use of anticoagulants: Secondary | ICD-10-CM | POA: Diagnosis not present

## 2022-07-22 DIAGNOSIS — C61 Malignant neoplasm of prostate: Secondary | ICD-10-CM | POA: Diagnosis present

## 2022-07-22 DIAGNOSIS — Z86718 Personal history of other venous thrombosis and embolism: Secondary | ICD-10-CM | POA: Diagnosis not present

## 2022-07-22 DIAGNOSIS — Z8546 Personal history of malignant neoplasm of prostate: Secondary | ICD-10-CM

## 2022-07-22 DIAGNOSIS — Z79899 Other long term (current) drug therapy: Secondary | ICD-10-CM | POA: Diagnosis not present

## 2022-07-22 LAB — CBC WITH DIFFERENTIAL (CANCER CENTER ONLY)
Abs Immature Granulocytes: 0.01 10*3/uL (ref 0.00–0.07)
Basophils Absolute: 0 10*3/uL (ref 0.0–0.1)
Basophils Relative: 1 %
Eosinophils Absolute: 0.1 10*3/uL (ref 0.0–0.5)
Eosinophils Relative: 2 %
HCT: 47.8 % (ref 39.0–52.0)
Hemoglobin: 15.1 g/dL (ref 13.0–17.0)
Immature Granulocytes: 0 %
Lymphocytes Relative: 29 %
Lymphs Abs: 1.7 10*3/uL (ref 0.7–4.0)
MCH: 24.4 pg — ABNORMAL LOW (ref 26.0–34.0)
MCHC: 31.6 g/dL (ref 30.0–36.0)
MCV: 77.2 fL — ABNORMAL LOW (ref 80.0–100.0)
Monocytes Absolute: 0.5 10*3/uL (ref 0.1–1.0)
Monocytes Relative: 9 %
Neutro Abs: 3.6 10*3/uL (ref 1.7–7.7)
Neutrophils Relative %: 59 %
Platelet Count: 184 10*3/uL (ref 150–400)
RBC: 6.19 MIL/uL — ABNORMAL HIGH (ref 4.22–5.81)
RDW: 17.9 % — ABNORMAL HIGH (ref 11.5–15.5)
WBC Count: 6 10*3/uL (ref 4.0–10.5)
nRBC: 0 % (ref 0.0–0.2)

## 2022-07-22 LAB — CMP (CANCER CENTER ONLY)
ALT: 47 U/L — ABNORMAL HIGH (ref 0–44)
AST: 35 U/L (ref 15–41)
Albumin: 4.4 g/dL (ref 3.5–5.0)
Alkaline Phosphatase: 54 U/L (ref 38–126)
Anion gap: 3 — ABNORMAL LOW (ref 5–15)
BUN: 16 mg/dL (ref 8–23)
CO2: 34 mmol/L — ABNORMAL HIGH (ref 22–32)
Calcium: 9.8 mg/dL (ref 8.9–10.3)
Chloride: 102 mmol/L (ref 98–111)
Creatinine: 1.08 mg/dL (ref 0.61–1.24)
GFR, Estimated: >60 mL/min (ref >60)
Glucose, Bld: 92 mg/dL (ref 70–99)
Potassium: 3.8 mmol/L (ref 3.5–5.1)
Sodium: 139 mmol/L (ref 135–145)
Total Bilirubin: 1 mg/dL (ref 0.3–1.2)
Total Protein: 6.9 g/dL (ref 6.5–8.1)

## 2022-07-23 ENCOUNTER — Encounter: Payer: Self-pay | Admitting: Oncology

## 2022-07-23 LAB — PROSTATE-SPECIFIC AG, SERUM (LABCORP): Prostate Specific Ag, Serum: <0.1 ng/mL (ref 0.0–4.0)

## 2022-07-29 ENCOUNTER — Inpatient Hospital Stay (HOSPITAL_BASED_OUTPATIENT_CLINIC_OR_DEPARTMENT_OTHER): Payer: Medicare Other | Admitting: Oncology

## 2022-07-29 ENCOUNTER — Other Ambulatory Visit: Payer: Medicare Other

## 2022-07-29 VITALS — BP 153/77 | HR 73 | Temp 97.7°F | Resp 16 | Wt 232.4 lb

## 2022-07-29 DIAGNOSIS — Z7901 Long term (current) use of anticoagulants: Secondary | ICD-10-CM | POA: Diagnosis not present

## 2022-07-29 DIAGNOSIS — Z8546 Personal history of malignant neoplasm of prostate: Secondary | ICD-10-CM

## 2022-07-29 DIAGNOSIS — Z86718 Personal history of other venous thrombosis and embolism: Secondary | ICD-10-CM | POA: Diagnosis not present

## 2022-07-29 DIAGNOSIS — C61 Malignant neoplasm of prostate: Secondary | ICD-10-CM | POA: Diagnosis not present

## 2022-07-29 DIAGNOSIS — Z79899 Other long term (current) drug therapy: Secondary | ICD-10-CM | POA: Diagnosis not present

## 2022-07-29 NOTE — Progress Notes (Signed)
Hematology and Oncology Follow Up Visit  [name] [id number] [birth year] 76 y.o. 07/29/2022 8:57 AM [name], MDPharr, [name], MD   Principle Diagnosis: 76 year old man with prostate cancer diagnosed in 2010.  He present with Gleason score of 6 without any evidence of disease recurrence.  Prior Therapy: Status post prostatectomy done on 10/20/2008.  Current therapy: Active surveillance.   Interim History: [name] returns today for a follow-up.  Since the last visit, he reports feeling well without any major complaints.  He has started testosterone replacement therapy which has improved his performance status and activity level.  He denies any hospitalizations or illnesses.  He denies any chest pain or shortness of breath.     Medications: Updated on review. Current Outpatient Medications  Medication Sig Dispense Refill   ALPRAZolam (XANAX) 1 MG tablet Take 1 tablet by mouth 2 (two) times daily.     Cholecalciferol (VITAMIN D3) 50 MCG (2000 UT) capsule Take 2,000 Units by mouth daily.     co-enzyme Q-10 30 MG capsule Take 30 mg by mouth every evening.     fluticasone (FLONASE) 50 MCG/ACT nasal spray USE 2 SPRAYS IN EACH NOSTRIL EVERY NIGHT 48 mL 6   hydrochlorothiazide (HYDRODIURIL) 25 MG tablet Take 25 mg by mouth daily.     hydrocortisone 2.5 % cream Apply 1 application topically 2 (two) times daily as needed.     Hypertonic Nasal Wash (SINUS RINSE BOTTLE KIT) PACK Place into the nose at bedtime.     Lidocaine-Hydrocortisone Ace 3-2.5 % KIT Place 1 application rectally daily as needed (hemorrhoids). 1 kit 3   metoprolol tartrate (LOPRESSOR) 100 MG tablet TAKE 1 TABLET BY MOUTH TWICE A DAY 180 tablet 2   Multiple Vitamin (MULTIVITAMIN) tablet Take 1 tablet by mouth daily.     oxymetazoline (AFRIN) 0.05 % nasal spray Place 2 sprays into the nose 2 (two) times daily.     pantoprazole (PROTONIX) 40 MG tablet TAKE 1 TABLET BY MOUTH EVERY DAY 90 tablet 3   ramipril (ALTACE)  10 MG capsule TAKE 1 CAPSULE BY MOUTH 2 (TWO) TIMES DAILY. PATIENT MUST SCHEDULE APPOINTMENT FOR FUTURE REFILLS 180 capsule 0   rivaroxaban (XARELTO) 20 MG TABS tablet Take 20 mg by mouth daily.     rosuvastatin (CRESTOR) 20 MG tablet Take 5 mg by mouth daily.     sertraline (ZOLOFT) 100 MG tablet Take 100 mg by mouth daily.     testosterone enanthate (DELATESTRYL) 200 MG/ML injection Inject into the muscle every 14 (fourteen) days. For IM use only     traMADol (ULTRAM) 50 MG tablet Take 1-2 tablets by mouth as needed.     Wheat Dextrin (BENEFIBER PO) Take 1 Dose by mouth 2 (two) times daily.     No current facility-administered medications for this visit.     Allergies:  Allergies  Allergen Reactions   Aspirin Other (See Comments)    REACTION: gi upset with higher dose   Penicillins Itching      Physical Exam:    Blood pressure (!) 153/77, pulse 73, temperature 97.7 F (36.5 C), temperature source Temporal, resp. rate 16, weight 232 lb 6.4 oz (105.4 kg), SpO2 97 %.   ECOG: 0   General appearance: Comfortable appearing without any discomfort Head: Normocephalic without any trauma Oropharynx: Mucous membranes are moist and pink without any thrush or ulcers. Eyes: Pupils are equal and round reactive to light. Lymph nodes: No cervical, supraclavicular, inguinal or axillary lymphadenopathy.   Heart:regular rate  and rhythm.  S1 and S2 without leg edema. Lung: Clear without any rhonchi or wheezes.  No dullness to percussion. Abdomin: Soft, nontender, nondistended with good bowel sounds.  No hepatosplenomegaly. Musculoskeletal: No joint deformity or effusion.  Full range of motion noted. Neurological: No deficits noted on motor, sensory and deep tendon reflex exam. Skin: No petechial rash or dryness.  Appeared moist.       Lab Results: Lab Results  Component Value Date   WBC 6.0 07/22/2022   HGB 15.1 07/22/2022   HCT 47.8 07/22/2022   MCV 77.2 (L) 07/22/2022   PLT 184  07/22/2022   PSA <0.01 07/13/2015     Chemistry      Component Value Date/Time   NA 139 07/22/2022 0939   NA 142 04/18/2018 1324   NA 140 07/14/2017 0817   K 3.8 07/22/2022 0939   K 3.8 07/14/2017 0817   CL 102 07/22/2022 0939   CL 103 11/02/2012 0737   CO2 34 (H) 07/22/2022 0939   CO2 28 07/14/2017 0817   BUN 16 07/22/2022 0939   BUN 12 04/18/2018 1324   BUN 16.8 07/14/2017 0817   CREATININE 1.08 07/22/2022 0939   CREATININE 1.1 07/14/2017 0817      Component Value Date/Time   CALCIUM 9.8 07/22/2022 0939   CALCIUM 9.4 07/14/2017 0817   ALKPHOS 54 07/22/2022 0939   ALKPHOS 67 07/14/2017 0817   AST 35 07/22/2022 0939   AST 35 (H) 07/14/2017 0817   ALT 47 (H) 07/22/2022 0939   ALT 57 (H) 07/14/2017 0817   BILITOT 1.0 07/22/2022 0939   BILITOT 0.60 07/14/2017 0817        Impression and Plan:   76 year old man with:  1. Prostate cancer presented with localized disease and Gleason score of 6 diagnosed in 2010.    The natural course of his disease was reviewed and treatment options were discussed.  At this time the risk of relapse remains very low given his low-grade disease and he is over 13 years out.  Will continue annual PSA check indefinitely.  2.  Testosterone replacement therapy: I have counseled him about hematological abnormalities including polycythemia among others which she expected that with testosterone replacement.  3.  Venous thromboembolism:.  No recent thrombosis or bleeding at this time.  He continues to be on Xarelto which will be continued indefinitely.  4. Follow-up: In 1 year for a follow-up visit.  30  minutes were spent on this encounter.  The time was dedicated to updating disease status, treatment choices and outlining future plan of care review.    Zola Button, MD 12/15/20238:57 AM

## 2022-08-05 ENCOUNTER — Ambulatory Visit: Payer: Medicare Other | Admitting: Oncology

## 2022-08-13 ENCOUNTER — Other Ambulatory Visit: Payer: Self-pay | Admitting: Cardiovascular Disease

## 2022-08-18 DIAGNOSIS — G4733 Obstructive sleep apnea (adult) (pediatric): Secondary | ICD-10-CM | POA: Diagnosis not present

## 2022-09-06 DIAGNOSIS — L573 Poikiloderma of Civatte: Secondary | ICD-10-CM | POA: Diagnosis not present

## 2022-09-06 DIAGNOSIS — Z1283 Encounter for screening for malignant neoplasm of skin: Secondary | ICD-10-CM | POA: Diagnosis not present

## 2022-09-06 DIAGNOSIS — L821 Other seborrheic keratosis: Secondary | ICD-10-CM | POA: Diagnosis not present

## 2022-09-06 DIAGNOSIS — D485 Neoplasm of uncertain behavior of skin: Secondary | ICD-10-CM | POA: Diagnosis not present

## 2022-09-06 DIAGNOSIS — D225 Melanocytic nevi of trunk: Secondary | ICD-10-CM | POA: Diagnosis not present

## 2022-10-06 ENCOUNTER — Other Ambulatory Visit: Payer: Self-pay | Admitting: Cardiovascular Disease

## 2022-10-06 DIAGNOSIS — R310 Gross hematuria: Secondary | ICD-10-CM | POA: Diagnosis not present

## 2022-10-12 DIAGNOSIS — K5730 Diverticulosis of large intestine without perforation or abscess without bleeding: Secondary | ICD-10-CM | POA: Diagnosis not present

## 2022-10-12 DIAGNOSIS — R310 Gross hematuria: Secondary | ICD-10-CM | POA: Diagnosis not present

## 2022-10-12 DIAGNOSIS — K760 Fatty (change of) liver, not elsewhere classified: Secondary | ICD-10-CM | POA: Diagnosis not present

## 2022-10-26 DIAGNOSIS — R310 Gross hematuria: Secondary | ICD-10-CM | POA: Diagnosis not present

## 2022-10-28 ENCOUNTER — Other Ambulatory Visit: Payer: Self-pay | Admitting: Cardiovascular Disease

## 2022-11-01 ENCOUNTER — Other Ambulatory Visit: Payer: Self-pay | Admitting: Cardiovascular Disease

## 2022-11-10 DIAGNOSIS — I10 Essential (primary) hypertension: Secondary | ICD-10-CM | POA: Diagnosis not present

## 2022-11-12 ENCOUNTER — Other Ambulatory Visit: Payer: Self-pay | Admitting: Cardiovascular Disease

## 2022-11-14 DIAGNOSIS — I2510 Atherosclerotic heart disease of native coronary artery without angina pectoris: Secondary | ICD-10-CM | POA: Diagnosis not present

## 2022-11-14 DIAGNOSIS — I10 Essential (primary) hypertension: Secondary | ICD-10-CM | POA: Diagnosis not present

## 2022-11-14 DIAGNOSIS — I700 Atherosclerosis of aorta: Secondary | ICD-10-CM | POA: Diagnosis not present

## 2022-11-14 DIAGNOSIS — I712 Thoracic aortic aneurysm, without rupture, unspecified: Secondary | ICD-10-CM | POA: Diagnosis not present

## 2022-11-14 DIAGNOSIS — K219 Gastro-esophageal reflux disease without esophagitis: Secondary | ICD-10-CM | POA: Diagnosis not present

## 2022-11-14 DIAGNOSIS — G2581 Restless legs syndrome: Secondary | ICD-10-CM | POA: Diagnosis not present

## 2022-11-14 DIAGNOSIS — R748 Abnormal levels of other serum enzymes: Secondary | ICD-10-CM | POA: Diagnosis not present

## 2022-11-14 DIAGNOSIS — Z0000 Encounter for general adult medical examination without abnormal findings: Secondary | ICD-10-CM | POA: Diagnosis not present

## 2022-11-14 DIAGNOSIS — D6859 Other primary thrombophilia: Secondary | ICD-10-CM | POA: Diagnosis not present

## 2022-11-14 DIAGNOSIS — R053 Chronic cough: Secondary | ICD-10-CM | POA: Diagnosis not present

## 2022-12-10 ENCOUNTER — Other Ambulatory Visit: Payer: Self-pay | Admitting: Cardiovascular Disease

## 2022-12-20 ENCOUNTER — Other Ambulatory Visit: Payer: Self-pay | Admitting: Cardiovascular Disease

## 2023-01-04 ENCOUNTER — Other Ambulatory Visit: Payer: Self-pay | Admitting: Cardiovascular Disease

## 2023-01-08 ENCOUNTER — Other Ambulatory Visit: Payer: Self-pay | Admitting: Cardiovascular Disease

## 2023-01-29 ENCOUNTER — Other Ambulatory Visit: Payer: Self-pay | Admitting: Cardiovascular Disease

## 2023-01-31 DIAGNOSIS — H2513 Age-related nuclear cataract, bilateral: Secondary | ICD-10-CM | POA: Diagnosis not present

## 2023-01-31 DIAGNOSIS — H16223 Keratoconjunctivitis sicca, not specified as Sjogren's, bilateral: Secondary | ICD-10-CM | POA: Diagnosis not present

## 2023-01-31 DIAGNOSIS — H25013 Cortical age-related cataract, bilateral: Secondary | ICD-10-CM | POA: Diagnosis not present

## 2023-01-31 DIAGNOSIS — H25043 Posterior subcapsular polar age-related cataract, bilateral: Secondary | ICD-10-CM | POA: Diagnosis not present

## 2023-02-07 ENCOUNTER — Other Ambulatory Visit: Payer: Self-pay | Admitting: Cardiovascular Disease

## 2023-03-08 DIAGNOSIS — L02222 Furuncle of back [any part, except buttock]: Secondary | ICD-10-CM | POA: Diagnosis not present

## 2023-03-08 DIAGNOSIS — Z1283 Encounter for screening for malignant neoplasm of skin: Secondary | ICD-10-CM | POA: Diagnosis not present

## 2023-03-08 DIAGNOSIS — D485 Neoplasm of uncertain behavior of skin: Secondary | ICD-10-CM | POA: Diagnosis not present

## 2023-03-08 DIAGNOSIS — D225 Melanocytic nevi of trunk: Secondary | ICD-10-CM | POA: Diagnosis not present

## 2023-03-08 DIAGNOSIS — B9689 Other specified bacterial agents as the cause of diseases classified elsewhere: Secondary | ICD-10-CM | POA: Diagnosis not present

## 2023-03-13 DIAGNOSIS — G4733 Obstructive sleep apnea (adult) (pediatric): Secondary | ICD-10-CM | POA: Diagnosis not present

## 2023-03-15 ENCOUNTER — Encounter: Payer: Self-pay | Admitting: Neurology

## 2023-03-15 ENCOUNTER — Ambulatory Visit: Payer: Medicare Other | Admitting: Neurology

## 2023-03-15 VITALS — BP 118/69 | HR 71 | Ht 71.0 in | Wt 239.0 lb

## 2023-03-15 DIAGNOSIS — G5603 Carpal tunnel syndrome, bilateral upper limbs: Secondary | ICD-10-CM | POA: Diagnosis not present

## 2023-03-15 DIAGNOSIS — R269 Unspecified abnormalities of gait and mobility: Secondary | ICD-10-CM | POA: Diagnosis not present

## 2023-03-15 DIAGNOSIS — R9082 White matter disease, unspecified: Secondary | ICD-10-CM

## 2023-03-15 DIAGNOSIS — G5623 Lesion of ulnar nerve, bilateral upper limbs: Secondary | ICD-10-CM | POA: Diagnosis not present

## 2023-03-15 NOTE — Progress Notes (Signed)
[facility]  PATIENT: [name] DOB: 10-Jan-1946  REFERRING DOCTOR OR PCP: Ola Spurr MD SOURCE: Patient, notes from primary care, imaging and lab results  _________________________________   HISTORICAL  CHIEF COMPLAINT:  Chief Complaint  Patient presents with   Follow-up    Rm 11, alone.  No changes.  Stable.    HISTORY OF PRESENT ILLNESS:  [name] is a 77 y.o. man with gait ataxia, and OSA on CPAP  Update  03/15/2023 He feels his gait is more balanced nunchanged.  No falls.   He notes pain in the left elbow to hand .  This comes and goes and only occurs at night - sometimes waking him up.  Shaking or moving improves the pain after a few minutes.    He has occasional bouts of nausea that last 15 minutes.   No vomiting.     The MRI of the brain 06/02/2021 shows stable T2/FLAIR hyperintense foci.  Although statistically these are more likely to represent chronic microvascular ischemic change, several of the foci are radially oriented to the ventricles and it is possible he has a very mild multiple sclerosis (his son is a patient of mine who has MS).  However, given that there was very little change over 10 years and > 84 years old  I do not think I would treat even if we were sure of the diagnosis.  Therefore, we will hold off on a lumbar puncture.  MRI of the cervical spine showed a normal spinal cord and some degenerative changes worse at C3-C4.  He has mre tingling in his hands, sometimes more noticaeable at noght  He has OSA and is on CPAP with good efficacy and compliance.    He has vivid dreams with rare  RBD like behavior (now usually just talks but had yelled out) He has no sleep paralysis.  He has mild EDS.   He never followed through with the PSG/MSLT.   He has mild EDS.     He sleeps well most nights.   He gets some deep sleep according to his watch.    He has several vascular risks:  Hypertension, hyperlipidemia, smoking.   No DM.    He also has coronary artery disease (high calcium score) but has no angina.    Stress testes were fine in the past.    CT has also shown a mild thoracic aortic aneurysm (4.5) that is followed with serial scanning.      Possible MS History: In 1987, he had vertigo and was unable to stand.  His neurologist mentioned MS as a possibility.   He improved after a month.   Vertigo returned in 2000 for a month and MS was again brought up but no firm diagnosis was made.   He again improved  He gets dizzy spells.  He does not get rotational vertigo but fels like he is pushed one way or the other.  .   Severe episodes lasting 20 seconds occur about 5-6/year.   He feels weak afterwards.  He does not feel he will pass out.    In 2012, he had an MRA showing dolichoectasia but no intracranial stenosis.     EPWORTH SLEEPINESS SCALE  On a scale of 0 - 3 what is the chance of dozing:  Sitting and Reading:   1 Watching TV:    1 Sitting inactive in a public place: 0 Passenger in car for one hour: 3 Lying down to rest in  the afternoon: 3 Sitting and talking to someone: 0 Sitting quietly after lunch:  0 In a car, stopped in traffic:  0  Total (out of 24):   8/24   mild excessive sleepiness.       Images reviewed: MRI of the head and MR angiogram 03/06/2011 showed T2/flair hyperintense foci, many in the periventricular white matter, some radially oriented to the ventricles.  There was mild age-appropriate generalized cortical atrophy.  Dolichoectasia of the vertebrobasilar and carotid arteries near the skull base were noted.  The MRI of the brain 06/02/2021 shows T2/FLAIR hyperintense foci mildly progressed compared to the 2012 MRI.  Some of the foci are radially oriented to the ventricles the most are nonspecific.  MRI of the cervical spine 06/02/2021 showed a normal spinal cord and some degenerative changes worse at C3-C4.   REVIEW OF SYSTEMS: Constitutional: No fevers, chills, sweats, or change in  appetite Eyes: No visual changes, double vision, eye pain Ear, nose and throat: No hearing loss, ear pain, nasal congestion, sore throat Cardiovascular: No chest pain, palpitations Respiratory:  No shortness of breath at rest or with exertion.   No wheezes GastrointestinaI: No nausea, vomiting, diarrhea, abdominal pain, fecal incontinence Genitourinary:  No dysuria, urinary retention or frequency.  No nocturia. Musculoskeletal:  No neck pain, back pain Integumentary: No rash, pruritus, skin lesions Neurological: as above Psychiatric: No depression at this time.  No anxiety Endocrine: No palpitations, diaphoresis, change in appetite, change in weigh or increased thirst Hematologic/Lymphatic:  No anemia, purpura, petechiae. Allergic/Immunologic: No itchy/runny eyes, nasal congestion, recent allergic reactions, rashes  ALLERGIES: Allergies  Allergen Reactions   Aspirin Other (See Comments)    REACTION: gi upset with higher dose   Penicillins Itching    HOME MEDICATIONS:  Current Outpatient Medications:    ALPRAZolam (XANAX) 1 MG tablet, Take 1 tablet by mouth 2 (two) times daily., Disp: , Rfl:    co-enzyme Q-10 30 MG capsule, Take 30 mg by mouth every evening., Disp: , Rfl:    Cyanocobalamin 1000 MCG/ML KIT, Inject 1 mL as directed once a week., Disp: , Rfl:    fluticasone (FLONASE) 50 MCG/ACT nasal spray, USE 2 SPRAYS IN EACH NOSTRIL EVERY NIGHT, Disp: 48 mL, Rfl: 6   Glutathione 200 MG/ML SOLN, Inject 1 mL into the muscle once a week., Disp: , Rfl:    hydrochlorothiazide (HYDRODIURIL) 25 MG tablet, Take 25 mg by mouth daily., Disp: , Rfl:    hydrocortisone 2.5 % cream, Apply 1 application topically 2 (two) times daily as needed., Disp: , Rfl:    Hypertonic Nasal Wash (SINUS RINSE BOTTLE KIT) PACK, Place into the nose at bedtime., Disp: , Rfl:    Lidocaine-Hydrocortisone Ace 3-2.5 % KIT, Place 1 application rectally daily as needed (hemorrhoids)., Disp: 1 kit, Rfl: 3   metoprolol  tartrate (LOPRESSOR) 100 MG tablet, TAKE 1 TABLET (100 MG TOTAL) BY MOUTH 2 (TWO) TIMES DAILY. NEED OV., Disp: 180 tablet, Rfl: 1   Multiple Vitamin (MULTIVITAMIN) tablet, Take 1 tablet by mouth daily., Disp: , Rfl:    oxymetazoline (AFRIN) 0.05 % nasal spray, Place 2 sprays into the nose 2 (two) times daily., Disp: , Rfl:    pantoprazole (PROTONIX) 40 MG tablet, TAKE 1 TABLET BY MOUTH EVERY DAY, Disp: 90 tablet, Rfl: 3   ramipril (ALTACE) 10 MG capsule, TAKE 1 CAPSULE (10 MG TOTAL) BY MOUTH 2 (TWO) TIMES DAILY. NEED OV., Disp: 15 capsule, Rfl: 0   rivaroxaban (XARELTO) 20 MG TABS tablet, Take  20 mg by mouth daily., Disp: , Rfl:    rosuvastatin (CRESTOR) 20 MG tablet, Take 5 mg by mouth daily., Disp: , Rfl:    sertraline (ZOLOFT) 100 MG tablet, Take 100 mg by mouth daily., Disp: , Rfl:    testosterone enanthate (DELATESTRYL) 200 MG/ML injection, Inject into the muscle every 14 (fourteen) days. For IM use only, Disp: , Rfl:    traMADol (ULTRAM) 50 MG tablet, Take 1-2 tablets by mouth as needed., Disp: , Rfl:    VITAMIN D, ERGOCALCIFEROL, PO, Take 5,000 Units by mouth daily., Disp: , Rfl:    Wheat Dextrin (BENEFIBER PO), Take 1 Dose by mouth 2 (two) times daily., Disp: , Rfl:   PAST MEDICAL HISTORY: Past Medical History:  Diagnosis Date   Antiphospholipid antibody syndrome (HCC) 05/22/2012   Found subsequent to recurrent PE 9/13   Arthritis    Bladder cancer (HCC) dx'd 2009   surg only   Chronic anal fissure    w/ recurrence's   Chronic anxiety    Depression    ED (erectile dysfunction)    Esophageal candidiasis (HCC) 03/14/2017   Fatty liver    GERD (gastroesophageal reflux disease)    Heart murmur    Hepatic steatosis    Hiatal hernia    History of adenomatous polyp of colon    2005   History of bladder cancer urologist-  dr Isabel Caprice   papillary TCC  s/p  turbt   History of DVT (deep vein thrombosis)    post prostate surgery 2010  right LLE   History of esophageal stricture    w/  dilation   History of gastric polyp    benign 2010   History of prostate cancer urologist- dr grapey/  oncology-  dr Clelia Croft   Stage  T1c , Gleason 3+3;   s/p  prostatectomy 03-08-210//  per pt currently PSA nondetectable    History of pulmonary embolus (PE)    post prostate surgery 2010  and 2013 x2 right side per CT scan (found to have lupus anticoaglated )   History of TIA (transient ischemic attack)    07/ 2012 noted on scans remote probable tia's in  age 69's   History of TMJ syndrome    HTN (hypertension)    Hyperlipidemia    Lupus anticoagulant positive    found 2013 when pt had PE   Moderate aortic regurgitation    with no  stenosis    OSA on CPAP    per study severe osa 12-14-2010   Prolapsed internal hemorrhoids, grade 2 03/31/2014   prostate ca dx'd 2010   surg only   RLS (restless legs syndrome)    RLS (restless legs syndrome)    Sleep apnea    Symptomatic PVCs cardiologist-  dr berry   intermittant   Wears glasses     PAST SURGICAL HISTORY: Past Surgical History:  Procedure Laterality Date   APPENDECTOMY  age 52   and Left Inguinal Hernia Repair   CARDIOVASCULAR STRESS TEST  06-30-2011   dr berry   normal nuclear study/  normal LV function and wall motion, ef 56%   COLONOSCOPY  last one 10-19-2011   CYSTO/ BLADDER BX/  RETROGRADE PYELOGRAM/  TRANSRECTAL PROSTATE BX'S  07-30-2008   ESOPHAGOGASTRODUODENOSCOPY  last one 06-27-2011   EVALUATION UNDER ANESTHESIA WITH FISTULECTOMY N/A 01/14/2016   Procedure: EXAM UNDER ANESTHESIA WITH REPAIR OF PERIRECTAL FISTULA  (LIFT);  Surgeon: Karie Soda, MD;  Location: [facility];  Service: General;  Laterality: N/A;   LAPAROSCOPIC CHOLECYSTECTOMY  05-25-2007   and Excision cyst back of neck   ROBOT ASSISTED LAPAROSCOPIC RADICAL PROSTATECTOMY  10-20-2008   STRABISMUS SURGERY Bilateral age 19   TONSILLECTOMY  age 48   TRANSTHORACIC ECHOCARDIOGRAM  03-23-2015   dr berry   grade 1 diastolic dysfunction, ef 55-60%/   mild AV thickened leaflets with no stenosis,  moderate AR/  mild ascending aorta ilatation3.9cm/  trivial MR and TR   UPPER GASTROINTESTINAL ENDOSCOPY      FAMILY HISTORY: Family History  Problem Relation Age of Onset   Hypertension Mother    Hypertension Father    Stroke Father    Colon polyps Father    Colon cancer Father        questionable   Colon polyps Brother    Crohn's disease Brother    Diabetes Maternal Grandmother    Heart disease Maternal Grandmother    Cirrhosis Other        Great Uncle   Esophageal cancer Neg Hx    Rectal cancer Neg Hx    Stomach cancer Neg Hx     SOCIAL HISTORY:  Social History   Socioeconomic History   Marital status: Married    Spouse name: cathy   Number of children: 2   Years of education: Not on file   Highest education level: Not on file  Occupational History   Occupation: Acupuncturist   Occupation: [employer]    Employer: [employer]  Tobacco Use   Smoking status: Some Days    Types: Cigars   Smokeless tobacco: Never   Tobacco comments:    5 cigars /week--/  smoked cigarettes for 10 years quit 1990's  Vaping Use   Vaping status: Never Used  Substance and Sexual Activity   Alcohol use: Yes    Alcohol/week: 28.0 standard drinks of alcohol    Types: 14 Glasses of wine, 14 Cans of beer per week    Comment: 2 beers a day   Drug use: No   Sexual activity: Not on file  Other Topics Concern   Not on file  Social History Narrative   Married to [place])   Lives with wife   Gets regular exercise.   Daily caffeine- 2 cups daily.   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   General: The patient is well-developed and well-nourished and in no acute distress.  Pharynx is Mallampati 2  HEENT:  Head is [place]/AT.  Sclera are anicteric.    Skin: Extremities are without rash or  edema.  Musculoskeletal:  Back is nontender  Neurologic Exam  Mental status: The patient is alert and oriented x 3 at the time of the examination. The patient has apparent normal recent and remote memory, with an apparently normal attention span  and concentration ability.   Speech is normal.  Cranial nerves: Extraocular movements show right exotropia (old).   ,  Color vision is mildly desaturated OS.  Facial symmetry is present.Reduced left face  sensation to soft touch.  Facial strength is normal.  Trapezius and sternocleidomastoid strength is normal. No dysarthria is noted.  Mild reduced left hearing and Weber lateralizes to the right.   Motor:  Mild ulnar innervated atrophy both hands.   .   Tone is normal. Strength is  4+ / 5 in right APB and right ulnar innervated but 5/5 elsewhere  OTHER:   Tinel's sign bilaterally at elbows and wrists, Phalens sign only  to right.    Sensory: Sensory testing shpws reduced touch and vib on the left arm/leg  Coordination: Cerebellar testing reveals good finger-nose-finger and heel-to-shin bilaterally.  Gait and station: Station is normal.   Gait is mildly wide and turn is wide.  Tandem gait is wide but better than last visit.  There is no Romberg sign. Reflexes: Deep tendon reflexes are symmetric and normal in rms and trace at knees.       DIAGNOSTIC DATA (LABS, IMAGING, TESTING) - I reviewed patient records, labs, notes, testing and imaging myself where available.  Lab Results  Component Value Date   WBC  6.0 07/22/2022   HGB 15.1 07/22/2022   HCT 47.8 07/22/2022   MCV 77.2 (L) 07/22/2022   PLT 184 07/22/2022      Component Value Date/Time   NA 139 07/22/2022 0939   NA 142 04/18/2018 1324   NA 140 07/14/2017 0817   K 3.8 07/22/2022 0939   K 3.8 07/14/2017 0817   CL 102 07/22/2022 0939   CL 103 11/02/2012 0737   CO2 34 (H) 07/22/2022 0939   CO2 28 07/14/2017 0817   GLUCOSE 92 07/22/2022 0939   GLUCOSE 104 07/14/2017 0817   GLUCOSE 95 11/02/2012 0737   BUN 16 07/22/2022 0939   BUN 12 04/18/2018 1324   BUN 16.8 07/14/2017 0817   CREATININE 1.08 07/22/2022 0939   CREATININE 1.1 07/14/2017 0817   CALCIUM 9.8 07/22/2022 0939   CALCIUM 9.4 07/14/2017 0817   PROT 6.9 07/22/2022 0939   PROT 7.0 05/14/2021 1106   PROT 7.1 07/14/2017 0817   ALBUMIN 4.4 07/22/2022 0939   ALBUMIN 4.7 05/14/2021 1106   ALBUMIN 4.1 07/14/2017 0817   AST 35 07/22/2022 0939   AST 35 (H) 07/14/2017 0817   ALT 47 (H) 07/22/2022 0939   ALT 57 (H) 07/14/2017 0817   ALKPHOS 54 07/22/2022 0939   ALKPHOS 67 07/14/2017 0817   BILITOT 1.0 07/22/2022 0939   BILITOT 0.60 07/14/2017 0817   GFRNONAA >60 07/22/2022 0939   GFRAA >60 07/26/2019 0818   Lab Results  Component Value Date   CHOL 137 05/14/2021   HDL 38 (L) 05/14/2021   LDLCALC 76 05/14/2021   TRIG 131 05/14/2021   CHOLHDL 3.6 05/14/2021   Lab Results  Component Value Date   HGBA1C 5.7 (H) 03/05/2011   Lab Results  Component Value Date   VITAMINB12 1030 (H) 12/12/2008   Lab Results  Component Value Date   TSH 0.764 03/30/2011       ASSESSMENT AND PLAN  Gait disturbance  White matter abnormality on MRI of brain  Ulnar neuropathy of both upper extremities - Plan: NCV with EMG(electromyography)  Bilateral carpal tunnel syndrome - Plan: NCV with EMG(electromyography)  He has possible multiple sclerosis.  Due to his age, and lack of  significant changes on MRI over many years, he does not need to be on a disease modifying therapy.   He understands that the diagnosis is not firm but we will hold off on checking a lumbar puncture as it is unlikely to change our course of action.   Stay active and exercise as tolerated. Continue CPAP for OSA.  Excessive daytime sleepiness is doing better.  If this worsens again consider modafinil.   He has had numbness and pain in the arms and appears to have mild atrophy in the ulnar innervated hand muscles.  We will have him do a NCV/EMG in bilateral arms to further investigate.  Consider referral to orthopedics based on results.   He will return to see [place] in 12 months or sooner if there are new or worsening neurologic symptoms or based on the results of the studies.  Romesha Scherer A. Epimenio Foot, MD, [facility] 03/15/2023, 12:53 PM Certified in Neurology, Clinical Neurophysiology, Sleep Medicine and Neuroimaging  [facility] [address], [address] [place], [place] [address] [phone]

## 2023-04-04 ENCOUNTER — Other Ambulatory Visit: Payer: Self-pay | Admitting: Internal Medicine

## 2023-04-04 ENCOUNTER — Other Ambulatory Visit: Payer: Self-pay | Admitting: Cardiovascular Disease

## 2023-04-07 ENCOUNTER — Other Ambulatory Visit: Payer: Self-pay | Admitting: Cardiovascular Disease

## 2023-04-13 DIAGNOSIS — G4733 Obstructive sleep apnea (adult) (pediatric): Secondary | ICD-10-CM | POA: Diagnosis not present

## 2023-04-14 DIAGNOSIS — G4733 Obstructive sleep apnea (adult) (pediatric): Secondary | ICD-10-CM | POA: Diagnosis not present

## 2023-04-17 ENCOUNTER — Other Ambulatory Visit: Payer: Self-pay | Admitting: Cardiovascular Disease

## 2023-04-19 ENCOUNTER — Telehealth: Payer: Self-pay | Admitting: Cardiovascular Disease

## 2023-04-19 DIAGNOSIS — I7121 Aneurysm of the ascending aorta, without rupture: Secondary | ICD-10-CM

## 2023-04-19 NOTE — Telephone Encounter (Signed)
Patient called stating he is due for his yearly Echo, he would like for an order to be placed.

## 2023-04-19 NOTE — Telephone Encounter (Signed)
Echo ordered. Left voicemail to return call to office

## 2023-04-24 ENCOUNTER — Other Ambulatory Visit: Payer: Self-pay | Admitting: Surgery

## 2023-04-24 DIAGNOSIS — I712 Thoracic aortic aneurysm, without rupture, unspecified: Secondary | ICD-10-CM

## 2023-04-29 ENCOUNTER — Other Ambulatory Visit: Payer: Self-pay | Admitting: Cardiovascular Disease

## 2023-05-05 ENCOUNTER — Ambulatory Visit (HOSPITAL_COMMUNITY): Payer: Medicare Other | Attending: Cardiovascular Disease

## 2023-05-05 DIAGNOSIS — I7121 Aneurysm of the ascending aorta, without rupture: Secondary | ICD-10-CM | POA: Diagnosis not present

## 2023-05-05 LAB — ECHOCARDIOGRAM COMPLETE
Area-P 1/2: 2.1 cm2
P 1/2 time: 466 msec
S' Lateral: 3.5 cm

## 2023-05-08 ENCOUNTER — Other Ambulatory Visit: Payer: Self-pay | Admitting: *Deleted

## 2023-05-08 ENCOUNTER — Telehealth: Payer: Self-pay | Admitting: Cardiovascular Disease

## 2023-05-08 ENCOUNTER — Other Ambulatory Visit: Payer: Self-pay | Admitting: Cardiovascular Disease

## 2023-05-08 DIAGNOSIS — I351 Nonrheumatic aortic (valve) insufficiency: Secondary | ICD-10-CM

## 2023-05-08 MED ORDER — RAMIPRIL 10 MG PO CAPS: 10.0000 mg | ORAL_CAPSULE | Freq: Two times a day (BID) | ORAL | 0 refills | Status: DC

## 2023-05-08 NOTE — Telephone Encounter (Signed)
*  STAT* If patient is at the pharmacy, call can be transferred to refill team.   1. Which medications need to be refilled? (please list name of each medication and dose if known) ramipril (ALTACE) 10 MG capsule   2. Would you like to learn more about the convenience, safety, & potential cost savings by using the [facility]? No   3. Are you open to using the [facility] No   4. Which pharmacy/location (including street and city if local pharmacy) is medication to be sent to? [id number] - [place], [place] - [address]   5. Do they need a 30 day or 90 day supply? 30 Day Supply  Pt is sch for 09/30.

## 2023-05-09 DIAGNOSIS — G4733 Obstructive sleep apnea (adult) (pediatric): Secondary | ICD-10-CM | POA: Diagnosis not present

## 2023-05-13 NOTE — Progress Notes (Unsigned)
Cardiology Clinic Note   Date: 05/15/2023 ID: Siddarth, Yerby 08-28-1945, MRN [id number]  Primary Cardiologist:  Nanetta Batty, MD  Patient Profile    [name] is a 77 y.o. male who presents to the clinic today for routine follow up.     Past medical history significant for: Elevated coronary calcium score. CT cardiac scoring 12/02/2020: Calcium score 2079 (92nd percentile). Hypertensive heart disease without heart failure. Echo 05/05/2023: EF 55 to 60%.  Grade 1 DD.  Normal RV function.  Mild LAE.  Mild MR.  Mild AI.  Aortic valve sclerosis/calcification without stenosis.  Mild dilatation of the ascending aorta 45 mm. Thoracic aortic aneurysm. CTA chest aorta 06/02/2021: Grossly stable 4.5 cm ascending thoracic aortic aneurysm. PE. Hypertension. Hyperlipidemia. Cardiac murmur. GERD. OSA. On CPAP 100% adherence. Bladder cancer. Lupus anticoagulant.      History of Present Illness    [name] is followed by Dr. Allyson Sabal for the above outlined history.  In summary, patient was initially followed by Dr. Elease Hashimoto in 2012.  He had a normal Myoview stress test and an event monitor that showed PVCs.  History of PE dating back to 2010.  In September 2013 CT scan showed 2 small subsegmental right-sided PE of unknown origin.  He did not have a DVT at that time.  He was placed on Xarelto.  He underwent hypercoagulable workout which was positive for lupus anticoagulant.  He then established with Dr. Allyson Sabal at the end of 2014.  In October 2020 CTA chest showed thoracic aortic aneurysm measuring 4.5 cm.  He is followed by Dr. Lavinia Sharps for this.  CTA October 2022 showed thoracic aortic aneurysm was stable measuring the same at 4.5 cm.  Patient was last seen in the office by Dr. Allyson Sabal on 05/14/2021 for routine follow-up.  He was doing well at that time and no changes were made.  He had an echo performed on 05/05/2023 which showed normal LV/RV function, Grade I DD, mild MR/AI, mild  dilatation of ascending aorta 45 mm.  Today, patient is doing well. Patient denies shortness of breath, dyspnea on exertion, lower extremity edema, orthopnea or PND. No chest pain, pressure, or tightness. He has occasional palpitations. He is very active using a stationary bike for 30 minutes daily.     ROS: All other systems reviewed and are otherwise negative except as noted in History of Present Illness.  Studies Reviewed    EKG Interpretation Date/Time:  Monday May 15 2023 14:41:27 EDT Ventricular Rate:  79 PR Interval:  178 QRS Duration:  96 QT Interval:  374 QTC Calculation: 428 R Axis:   -19  Text Interpretation: Normal sinus rhythm Minimal voltage criteria for LVH, may be normal variant ( R in aVL ) When compared with ECG of 14-Jan-2016 06:18, No significant change was found Confirmed by Carlos Levering [phone]) on 05/15/2023 2:46:31 PM           Physical Exam    VS:  BP 120/70 (BP Location: Left Arm, Patient Position: Sitting, Cuff Size: Normal)   Pulse 79   Ht 5\' 11"  (1.803 m)   Wt 244 lb (110.7 kg)   BMI 34.03 kg/m  , BMI Body mass index is 34.03 kg/m.  GEN: Well nourished, well developed, in no acute distress. Neck: No JVD or carotid bruits. Cardiac:  RRR. No murmurs. No rubs or gallops.   Respiratory:  Respirations regular and unlabored. Clear to auscultation without rales, wheezing or rhonchi. GI: Soft, nontender, nondistended.  Extremities: Radials/DP/PT 2+ and equal bilaterally. No clubbing or cyanosis. No edema.  Skin: Warm and dry, no rash. Neuro: Strength intact.  Assessment & Plan    Hypertensive heart disease without heart failure.  Echo September 2024 showed normal LV/RV function, Grade I DD, mild MR/AI, mild dilatation of ascending aorta 45 mm. Patient denies shortness of breath, DOE, lower extremity edema, PND, orthopnea.  Continue metoprolol, hydrochlorothiazide, ramipril. Thoracic aortic aneurysm.  CTA chest aorta October 2022 showed  grossly stable 4.5 cm ascending thoracic aortic aneurysm.  Patient denies chest pain, back pain, abdominal pain, shortness of breath, presyncope, syncope.  Patient is scheduled for repeat CTA October 2024.  Continue to follow with Dr. Lavinia Sharps. Hypertension. BP today 120/70. Patient denies headaches, dizziness or vision changes. Continue ramipril, metoprolol, hydrochlorothiazide. History of PE.  Dating back to 2010.  CT scan September 2013 showed 2 small subsegmental right-sided PE of unknown origin.  Patient denies shortness of breath or DOE.  Denies spontaneous bleeding concerns.  Continue Xarelto.  Managed by PCP.  Disposition: Return in 1 year or sooner as needed.         Signed, Etta Grandchild. Hadrian Yarbrough, DNP, NP-C

## 2023-05-14 DIAGNOSIS — G4733 Obstructive sleep apnea (adult) (pediatric): Secondary | ICD-10-CM | POA: Diagnosis not present

## 2023-05-15 ENCOUNTER — Encounter: Payer: Self-pay | Admitting: Student

## 2023-05-15 ENCOUNTER — Ambulatory Visit: Payer: Medicare Other | Attending: Student | Admitting: Student

## 2023-05-15 VITALS — BP 120/70 | HR 79 | Ht 71.0 in | Wt 244.0 lb

## 2023-05-15 DIAGNOSIS — I7121 Aneurysm of the ascending aorta, without rupture: Secondary | ICD-10-CM | POA: Diagnosis not present

## 2023-05-15 DIAGNOSIS — I119 Hypertensive heart disease without heart failure: Secondary | ICD-10-CM

## 2023-05-15 DIAGNOSIS — Z86711 Personal history of pulmonary embolism: Secondary | ICD-10-CM | POA: Diagnosis not present

## 2023-05-15 DIAGNOSIS — I10 Essential (primary) hypertension: Secondary | ICD-10-CM

## 2023-05-15 MED ORDER — RAMIPRIL 10 MG PO CAPS: 10.0000 mg | ORAL_CAPSULE | Freq: Two times a day (BID) | ORAL | 3 refills | Status: DC

## 2023-05-15 NOTE — Patient Instructions (Signed)
Medication Instructions:  - refilled your ramipril (ALTACE) 10mg , two times daily.    *If you need a refill on your cardiac medications before your next appointment, please call your pharmacy*    Follow-Up: At [facility], you and your health needs are our priority.  As part of our continuing mission to provide you with exceptional heart care, we have created designated Provider Care Teams.  These Care Teams include your primary Cardiologist (physician) and Advanced Practice Providers (APPs -  Physician Assistants and Nurse Practitioners) who all work together to provide you with the care you need, when you need it.  We recommend signing up for the patient portal called "MyChart".  Sign up information is provided on this After Visit Summary.  MyChart is used to connect with patients for Virtual Visits (Telemedicine).  Patients are able to view lab/test results, encounter notes, upcoming appointments, etc.  Non-urgent messages can be sent to your provider as well.   To learn more about what you can do with MyChart, go to [email or web address].    Your next appointment:   1 year(s)  The format for your next appointment:   In Person  Provider:   Nanetta Batty, MD

## 2023-05-16 ENCOUNTER — Encounter: Payer: Self-pay | Admitting: Surgery

## 2023-05-22 ENCOUNTER — Ambulatory Visit: Payer: Self-pay | Admitting: Neurology

## 2023-05-22 ENCOUNTER — Ambulatory Visit (INDEPENDENT_AMBULATORY_CARE_PROVIDER_SITE_OTHER): Payer: Medicare Other | Admitting: Neurology

## 2023-05-22 DIAGNOSIS — G5603 Carpal tunnel syndrome, bilateral upper limbs: Secondary | ICD-10-CM

## 2023-05-22 DIAGNOSIS — G5623 Lesion of ulnar nerve, bilateral upper limbs: Secondary | ICD-10-CM

## 2023-05-22 DIAGNOSIS — Z0289 Encounter for other administrative examinations: Secondary | ICD-10-CM

## 2023-05-22 NOTE — Progress Notes (Signed)
Full Name: [name] Gender: Male MRN #: [id number] Date of Birth: 03-Nov-1945    Visit Date: 05/22/2023 12:26 Age: 77 Years Examining Physician: Dr. Naomie Dean Referring Physician: Dr. Despina Arias Height: 5 feet 11 inch  History: He notes pain in the left elbow to hand digits 4-5 . This comes and goes and only occurs at night - sometimes waking him up. Shaking or moving improves the pain after a few minutes. This is the distribution of his worse symptoms but he has symptome in digits 1-3 as well but not as significant. Focused exam shows weakness more in the ulnar FDP of the distal fingers especially digit 5, with intact FDP median distally. Discussed findings, reviewed AVS and also informed patient of sequelae of untreated nerve entrapments including permanent weakness and sensory changes. Patient would like physical therapy at this time for conservative treatment and possibly a referral to orthopaedics in the future. :  Orders Placed This Encounter  Procedures   Ambulatory referral to Occupational Therapy   Summary: EMG/NCS was performed on the bilateral upper extremities: The left median APB motor nerve showed delayed distal onset latency (5.7 ms, normal less than 4.4) and decreased conduction velocity (43 m/s, normal greater than 49).  The right median APB motor nerve showed delayed distal onset latency (6.7 ms, normal less than 4.4).Marland Kitchen The left Ulnar ADM motor nerve showed delayed distal onset latency (5.1 ms, normal less than 3.3) and reduced amplitude (1.3 mV, normal greater than 6) with no response above or below the elbow.  The right ulnar ADM motor nerve showed delayed distal onset latency (4.5 ms, normal less than 3.3), reduced amplitude (2.7 mV, normal greater than 6), decreased conduction velocity (above the elbow to below the elbow, 25 m/s, normal greater than 49) with a 37 m/s drop across the elbow section (normal less than 10 m/s).  The left radial sensory nerve  showed delayed distal peak latency (3.3 ms, normal less than 2.9) and reduced amplitude (8 V, normal greater than 15. The right radial sensory nerve showed delayed distal peak latency (3.2 ms, normal less than 2.9) and reduced amplitude (8 V, normal greater than 15.  The left median orthodromic sensory nerve showed no response.  The right median orthodromic sensory nerve showed delayed distal peak latency (5.4 ms, normal less than 3.4) and reduced amplitude (6 V, normal greater than 10).  The left ulnar and right ulnar orthodromic sensory nerves showed no response.All remaining nerves (as indicated in the following tables) were within normal limits.  The right flexor digitorum(ulnar) muscle showed increased spontaneous activity, prolonged motor unit duration, polyphasic motor units and diminished motor unit recruitment.  The right first dorsal interosseous motor nerve showed prolonged motor unit duration, polyphasic motor units and diminished motor unit recruitment.  The right opponens pollicis showed increased motor unit amplitude, polyphasic motor units and diminished motor unit recruitment. All remaining muscles (as indicated in the following tables) were within normal limits.     Conclusion: There is electrophysiologic evidence for severe right ulnar neuropathy at the elbow, moderately-severe left ulnar neuropathy at the elbow, and bilateral moderately-severe Carpal Tunnel Syndrome. Given the abnormal radial sensory conductions, there may also be concomitant polyneuropathy. No suggestion of cervical radiculopathy.    ------------------------------- Naomie Dean, M.D.  [facility] [address] [place], [place] [address] Tel: [phone] Fax: [phone]  Verbal informed consent was obtained from the patient, patient was informed of potential risk of procedure,  including bruising, bleeding, hematoma formation, infection, muscle weakness, muscle pain, numbness, among  others.        MNC    Nerve / Sites Muscle Latency Ref. Amplitude Ref. Rel Amp Segments Distance Velocity Ref. Area    ms ms mV mV %  cm m/s m/s mVms  L Median - APB     Wrist APB 5.7 <=4.4 7.2 >=4.0 100 Wrist - APB 7   30.4     Upper arm APB 11.6  6.1  84.4 Upper arm - Wrist 25.6 43 >=49 27.5  R Median - APB     Wrist APB 6.7 <=4.4 4.4 >=4.0 100 Wrist - APB 7   13.3     Upper arm APB 12.4  3.8  86.2 Upper arm - Wrist 28 49 >=49 10.2  L Ulnar - ADM     Wrist ADM 5.1 <=3.3 1.3 >=6.0 100 Wrist - ADM 7   3.3     B.Elbow ADM NR  NR  NR B.Elbow - Wrist   >=49 NR  R Ulnar - ADM     Wrist ADM 4.5 <=3.3 2.7 >=6.0 100 Wrist - ADM 7   11.1     B.Elbow ADM 6.4  0.9  31.5 B.Elbow - Wrist 12 62 >=49 2.6     A.Elbow ADM 15.2  3.5  410 A.Elbow - B.Elbow 22 25 >=49 14.8             SNC    Nerve / Sites Rec. Site Peak Lat Ref.  Amp Ref. Segments Distance    ms ms V V  cm  L Radial - Anatomical snuff box (Forearm)     Forearm Wrist 3.3 <=2.9 8 >=15 Forearm - Wrist 10  R Radial - Anatomical snuff box (Forearm)     Forearm Wrist 3.2 <=2.9 8 >=15 Forearm - Wrist 10  L Median - Orthodromic (Dig II, Mid palm)     Dig II Wrist NR <=3.4 NR >=10 Dig II - Wrist 13  R Median - Orthodromic (Dig II, Mid palm)     Dig II Wrist 5.4 <=3.4 6 >=10 Dig II - Wrist 13  L Ulnar - Orthodromic, (Dig V, Mid palm)     Dig V Wrist NR <=3.1 NR >=5 Dig V - Wrist 11  R Ulnar - Orthodromic, (Dig V, Mid palm)     Dig V Wrist NR <=3.1 NR >=5 Dig V - Wrist 70                 F  Wave    Nerve F Lat Ref.   ms ms  R Ulnar - ADM 42.9 <=32.0       EMG Summary Table    Spontaneous MUAP Recruitment  Muscle IA Fib PSW Fasc Other Amp Dur. Poly Pattern  R. Deltoid Normal None None None _______ Normal Normal Normal Normal  R. Triceps brachii Normal None None None _______ Normal Normal Normal Normal  L. Deltoid Normal None None None _______ Normal Normal Normal Normal  L. Triceps brachii Normal None None None _______ Normal  Normal Normal Normal  L. Flexor digitorum profundus (Ulnar) Normal None None None _______ Normal Normal Normal Normal  R. Flexor digitorum profundus (Ulnar) Normal None 3+ None _______ Normal Increased 2+ Reduced  L. First dorsal interosseous Normal None None None _______ Normal Normal Normal Normal  R. First dorsal interosseous Normal None None None _______ Normal Increased 2+ Reduced  L. Pronator teres Normal None None  None _______ Normal Normal Normal Normal  R. Pronator teres Normal None None None _______ Normal Normal Normal Normal  L. Extensor indicis proprius Normal None None None _______ Normal Normal Normal Normal  R. Extensor indicis proprius Normal None None None _______ Normal Normal Normal Normal  L. Opponens pollicis Normal None None None _______ Normal Normal Normal Normal  R. Opponens pollicis Normal None None None _______ Increased Normal 1+ Reduced  R. Cervical paraspinals (low) Normal None None None _______ Normal Normal Normal Normal

## 2023-05-30 NOTE — Procedures (Signed)
Full Name: [name] Gender: Male MRN #: [id number] Date of Birth: 09-May-1946    Visit Date: 05/22/2023 12:26 Age: 77 Years Examining Physician: Dr. Naomie Dean Referring Physician: Dr. Despina Arias Height: 5 feet 11 inch  History: He notes pain in the left elbow to hand digits 4-5 . This comes and goes and only occurs at night - sometimes waking him up. Shaking or moving improves the pain after a few minutes. This is the distribution of his worse symptoms but he has symptome in digits 1-3 as well but not as significant. Focused exam shows weakness more in the ulnar FDP of the distal fingers especially digit 5, with intact FDP median distally. Discussed findings, reviewed AVS and also informed patient of sequelae of untreated nerve entrapments including permanent weakness and sensory changes. Patient would like physical therapy at this time for conservative treatment and possibly a referral to orthopaedics in the future. :  Summary: EMG/NCS was performed on the bilateral upper extremities: The left median APB motor nerve showed delayed distal onset latency (5.7 ms, normal less than 4.4) and decreased conduction velocity (43 m/s, normal greater than 49).  The right median APB motor nerve showed delayed distal onset latency (6.7 ms, normal less than 4.4).Marland Kitchen The left Ulnar ADM motor nerve showed delayed distal onset latency (5.1 ms, normal less than 3.3) and reduced amplitude (1.3 mV, normal greater than 6) with no response above or below the elbow.  The right ulnar ADM motor nerve showed delayed distal onset latency (4.5 ms, normal less than 3.3), reduced amplitude (2.7 mV, normal greater than 6), decreased conduction velocity (above the elbow to below the elbow, 25 m/s, normal greater than 49) with a 37 m/s drop across the elbow section (normal less than 10 m/s).  The left radial sensory nerve showed delayed distal peak latency (3.3 ms, normal less than 2.9) and reduced amplitude (8 V,  normal greater than 15. The right radial sensory nerve showed delayed distal peak latency (3.2 ms, normal less than 2.9) and reduced amplitude (8 V, normal greater than 15.  The left median orthodromic sensory nerve showed no response.  The right median orthodromic sensory nerve showed delayed distal peak latency (5.4 ms, normal less than 3.4) and reduced amplitude (6 V, normal greater than 10).  The left ulnar and right ulnar orthodromic sensory nerves showed no response.All remaining nerves (as indicated in the following tables) were within normal limits.  The right flexor digitorum(ulnar) muscle showed increased spontaneous activity, prolonged motor unit duration, polyphasic motor units and diminished motor unit recruitment.  The right first dorsal interosseous motor nerve showed prolonged motor unit duration, polyphasic motor units and diminished motor unit recruitment.  The right opponens pollicis showed increased motor unit amplitude, polyphasic motor units and diminished motor unit recruitment. All remaining muscles (as indicated in the following tables) were within normal limits.     Conclusion: There is electrophysiologic evidence for severe right ulnar neuropathy at the elbow, moderately-severe left ulnar neuropathy at the elbow, and bilateral moderately-severe Carpal Tunnel Syndrome. Given the abnormal radial sensory conductions, there may also be concomitant polyneuropathy. No suggestion of cervical radiculopathy.    ------------------------------- Naomie Dean, M.D.  [facility] [address] [place], [place] [address] Tel: [phone] Fax: [phone]  Verbal informed consent was obtained from the patient, patient was informed of potential risk of procedure, including bruising, bleeding, hematoma formation, infection, muscle weakness, muscle pain, numbness, among others.  MNC    Nerve / Sites Muscle Latency Ref. Amplitude Ref. Rel Amp Segments  Distance Velocity Ref. Area    ms ms mV mV %  cm m/s m/s mVms  L Median - APB     Wrist APB 5.7 <=4.4 7.2 >=4.0 100 Wrist - APB 7   30.4     Upper arm APB 11.6  6.1  84.4 Upper arm - Wrist 25.6 43 >=49 27.5  R Median - APB     Wrist APB 6.7 <=4.4 4.4 >=4.0 100 Wrist - APB 7   13.3     Upper arm APB 12.4  3.8  86.2 Upper arm - Wrist 28 49 >=49 10.2  L Ulnar - ADM     Wrist ADM 5.1 <=3.3 1.3 >=6.0 100 Wrist - ADM 7   3.3     B.Elbow ADM NR  NR  NR B.Elbow - Wrist   >=49 NR  R Ulnar - ADM     Wrist ADM 4.5 <=3.3 2.7 >=6.0 100 Wrist - ADM 7   11.1     B.Elbow ADM 6.4  0.9  31.5 B.Elbow - Wrist 12 62 >=49 2.6     A.Elbow ADM 15.2  3.5  410 A.Elbow - B.Elbow 22 25 >=49 14.8             SNC    Nerve / Sites Rec. Site Peak Lat Ref.  Amp Ref. Segments Distance    ms ms V V  cm  L Radial - Anatomical snuff box (Forearm)     Forearm Wrist 3.3 <=2.9 8 >=15 Forearm - Wrist 10  R Radial - Anatomical snuff box (Forearm)     Forearm Wrist 3.2 <=2.9 8 >=15 Forearm - Wrist 10  L Median - Orthodromic (Dig II, Mid palm)     Dig II Wrist NR <=3.4 NR >=10 Dig II - Wrist 13  R Median - Orthodromic (Dig II, Mid palm)     Dig II Wrist 5.4 <=3.4 6 >=10 Dig II - Wrist 13  L Ulnar - Orthodromic, (Dig V, Mid palm)     Dig V Wrist NR <=3.1 NR >=5 Dig V - Wrist 11  R Ulnar - Orthodromic, (Dig V, Mid palm)     Dig V Wrist NR <=3.1 NR >=5 Dig V - Wrist 21                 F  Wave    Nerve F Lat Ref.   ms ms  R Ulnar - ADM 42.9 <=32.0       EMG Summary Table    Spontaneous MUAP Recruitment  Muscle IA Fib PSW Fasc Other Amp Dur. Poly Pattern  R. Deltoid Normal None None None _______ Normal Normal Normal Normal  R. Triceps brachii Normal None None None _______ Normal Normal Normal Normal  L. Deltoid Normal None None None _______ Normal Normal Normal Normal  L. Triceps brachii Normal None None None _______ Normal Normal Normal Normal  L. Flexor digitorum profundus (Ulnar) Normal None None None _______  Normal Normal Normal Normal  R. Flexor digitorum profundus (Ulnar) Normal None 3+ None _______ Normal Increased 2+ Reduced  L. First dorsal interosseous Normal None None None _______ Normal Normal Normal Normal  R. First dorsal interosseous Normal None None None _______ Normal Increased 2+ Reduced  L. Pronator teres Normal None None None _______ Normal Normal Normal Normal  R. Pronator teres Normal None None None _______ Normal Normal Normal Normal  L. Extensor indicis proprius Normal None None None _______ Normal Normal Normal Normal  R. Extensor indicis proprius Normal None None None _______ Normal Normal Normal Normal  L. Opponens pollicis Normal None None None _______ Normal Normal Normal Normal  R. Opponens pollicis Normal None None None _______ Increased Normal 1+ Reduced  R. Cervical paraspinals (low) Normal None None None _______ Normal Normal Normal Normal

## 2023-06-05 ENCOUNTER — Ambulatory Visit
Admission: RE | Admit: 2023-06-05 | Discharge: 2023-06-05 | Disposition: A | Discharge status: Home / Self Care | Payer: Medicare Other | Source: Ambulatory Visit | Attending: Surgery | Admitting: Surgery

## 2023-06-05 DIAGNOSIS — I7121 Aneurysm of the ascending aorta, without rupture: Secondary | ICD-10-CM | POA: Diagnosis not present

## 2023-06-05 DIAGNOSIS — I712 Thoracic aortic aneurysm, without rupture, unspecified: Secondary | ICD-10-CM

## 2023-06-05 MED ORDER — IOPAMIDOL (ISOVUE-370) INJECTION 76%
500.0000 mL | Freq: Once | INTRAVENOUS | Status: AC | PRN
  Administered 2023-06-05: 75 mL via INTRAVENOUS

## 2023-06-07 ENCOUNTER — Encounter: Payer: Self-pay | Admitting: Surgery

## 2023-06-07 ENCOUNTER — Ambulatory Visit: Payer: Medicare Other | Admitting: Surgery

## 2023-06-07 VITALS — BP 158/80 | HR 72 | Resp 18 | Ht 71.0 in | Wt 240.0 lb

## 2023-06-07 DIAGNOSIS — I712 Thoracic aortic aneurysm, without rupture, unspecified: Secondary | ICD-10-CM

## 2023-06-07 NOTE — Progress Notes (Signed)
HPI:  The patient is a 77 year old gentleman with history of hypertension who returns today for follow-up of a 4.5 cm fusiform ascending aortic aneurysm.  He remains active and feels well without chest pain or shortness of breath.  He had a 2D echo on 05/05/2023 showing normal left ventricular ejection fraction of 55 to 60% with grade 1 diastolic dysfunction.  The aortic valve is trileaflet with mild calcification and thickening.  There is no stenosis and mild insufficiency.  Current Outpatient Medications  Medication Sig Dispense Refill   ALPRAZolam (XANAX) 1 MG tablet Take 1 tablet by mouth 2 (two) times daily.     co-enzyme Q-10 30 MG capsule Take 30 mg by mouth every evening.     Cyanocobalamin 1000 MCG/ML KIT Inject 1 mL as directed once a week.     fluticasone (FLONASE) 50 MCG/ACT nasal spray USE 2 SPRAYS IN EACH NOSTRIL EVERY NIGHT 48 mL 6   Glutathione 200 MG/ML SOLN Inject 1 mL into the muscle once a week.     hydrochlorothiazide (HYDRODIURIL) 25 MG tablet Take 25 mg by mouth daily.     hydrocortisone 2.5 % cream Apply 1 application topically 2 (two) times daily as needed.     Hypertonic Nasal Wash (SINUS RINSE BOTTLE KIT) PACK Place into the nose at bedtime.     Lidocaine-Hydrocortisone Ace 3-2.5 % KIT Place 1 application rectally daily as needed (hemorrhoids). 1 kit 3   metoprolol tartrate (LOPRESSOR) 100 MG tablet TAKE 1 TABLET (100 MG TOTAL) BY MOUTH 2 (TWO) TIMES DAILY. NEED OV. 180 tablet 1   Multiple Vitamin (MULTIVITAMIN) tablet Take 1 tablet by mouth daily.     oxymetazoline (AFRIN) 0.05 % nasal spray Place 2 sprays into the nose 2 (two) times daily.     pantoprazole (PROTONIX) 40 MG tablet TAKE 1 TABLET BY MOUTH EVERY DAY 90 tablet 3   ramipril (ALTACE) 10 MG capsule Take 1 capsule (10 mg total) by mouth 2 (two) times daily. 180 capsule 3   rivaroxaban (XARELTO) 20 MG TABS tablet Take 20 mg by mouth daily.     rosuvastatin (CRESTOR) 20 MG tablet Take 10 mg by mouth daily.      sertraline (ZOLOFT) 100 MG tablet Take 100 mg by mouth daily.     testosterone enanthate (DELATESTRYL) 200 MG/ML injection Inject into the muscle every 14 (fourteen) days. For IM use only     traMADol (ULTRAM) 50 MG tablet Take 1-2 tablets by mouth as needed.     VITAMIN D, ERGOCALCIFEROL, PO Take 5,000 Units by mouth daily.     Wheat Dextrin (BENEFIBER PO) Take 1 Dose by mouth 2 (two) times daily.     No current facility-administered medications for this visit.     Physical Exam:  BP (!) 158/80 (BP Location: Left Arm, Patient Position: Sitting)   Pulse 72   Resp 18   Ht 5\' 11"  (1.803 m)   Wt 240 lb (108.9 kg)   SpO2 94% Comment: RA  BMI 33.47 kg/m  He looks well. Cardiac exam shows a regular rate and rhythm with normal heart sounds.  There is no murmur. Lungs are clear.   Diagnostic Tests:  Narrative & Impression  CLINICAL DATA:  Thoracic aortic aneurysm.   EXAM: CT ANGIOGRAPHY CHEST WITH CONTRAST   TECHNIQUE: Multidetector CT imaging of the chest was performed using the standard protocol during bolus administration of intravenous contrast. Multiplanar CT image reconstructions and MIPs were obtained to evaluate the vascular anatomy.  RADIATION DOSE REDUCTION: This exam was performed according to the departmental dose-optimization program which includes automated exposure control, adjustment of the mA and/or kV according to patient size and/or use of iterative reconstruction technique.   CONTRAST:  75mL ISOVUE-370 IOPAMIDOL (ISOVUE-370) INJECTION 76%   COMPARISON:  CTA chest 06/02/2021   FINDINGS: Cardiovascular: The ascending thoracic aorta is aneurysmal measuring up to 4.5 cm in maximum dimension, unchanged (8-236, 9-68). There is no evidence of dissection. The heart size is normal. There is no pericardial effusion. Coronary artery calcifications and mild calcified plaque in the thoracic aorta noted.   Mediastinum/Nodes: The thyroid is unremarkable. The  esophagus is grossly unremarkable. There is no mediastinal, hilar, or axillary lymphadenopathy.   Lungs/Pleura: The trachea and central airways are patent.   The lungs clear, with no focal consolidation or pulmonary edema. There is no pleural effusion or pneumothorax   A calcified granuloma in the left posterior lung base is unchanged. There are no suspicious nodules.   Upper Abdomen: The imaged upper abdominal viscera are unremarkable.   Musculoskeletal: There is no acute osseous abnormality or suspicious osseous lesion.   Review of the MIP images confirms the above findings.   IMPRESSION: Stable 4.5 cm ascending thoracic aortic aneurysm. Recommend semiannual imaging follow-up and referral to cardiothoracic surgery if not already done.     Electronically Signed   By: Lesia Hausen M.D.   On: 06/05/2023 12:05      Impression:  This 77 year old gentleman has a stable 4.5 cm fusiform ascending aortic aneurysm.  It is well below the surgical threshold of 5.5 cm.  I reviewed the CTA images with him and answered all of his questions.  I stressed the importance of continued good blood pressure control in preventing further enlargement and acute aortic dissection.  I advised him against doing any heavy lifting or strenuous physical activity that may result in a Valsalva maneuver and could suddenly raise his blood pressure to high levels.  He continues to check his blood pressure at home and it is usually under good control.  Plan:  He will return to see me in 2 years with a CTA of the chest for aortic surveillance.  I spent 15 minutes performing this established patient evaluation and > 50% of this time was spent face to face counseling and coordinating the care of this patient's aortic aneurysm.    Alleen Borne, MD [facility] [phone]

## 2023-06-08 DIAGNOSIS — R8289 Other abnormal findings on cytological and histological examination of urine: Secondary | ICD-10-CM | POA: Diagnosis not present

## 2023-06-08 DIAGNOSIS — R310 Gross hematuria: Secondary | ICD-10-CM | POA: Diagnosis not present

## 2023-06-13 DIAGNOSIS — G4733 Obstructive sleep apnea (adult) (pediatric): Secondary | ICD-10-CM | POA: Diagnosis not present

## 2023-07-03 ENCOUNTER — Other Ambulatory Visit: Payer: Self-pay | Admitting: Cardiovascular Disease

## 2023-07-11 DIAGNOSIS — Z1283 Encounter for screening for malignant neoplasm of skin: Secondary | ICD-10-CM | POA: Diagnosis not present

## 2023-07-11 DIAGNOSIS — D2262 Melanocytic nevi of left upper limb, including shoulder: Secondary | ICD-10-CM | POA: Diagnosis not present

## 2023-07-11 DIAGNOSIS — D485 Neoplasm of uncertain behavior of skin: Secondary | ICD-10-CM | POA: Diagnosis not present

## 2023-07-11 DIAGNOSIS — D225 Melanocytic nevi of trunk: Secondary | ICD-10-CM | POA: Diagnosis not present

## 2023-07-14 DIAGNOSIS — G4733 Obstructive sleep apnea (adult) (pediatric): Secondary | ICD-10-CM | POA: Diagnosis not present

## 2023-07-21 ENCOUNTER — Inpatient Hospital Stay: Payer: Medicare Other | Attending: Internal Medicine

## 2023-07-21 DIAGNOSIS — G2581 Restless legs syndrome: Secondary | ICD-10-CM | POA: Diagnosis not present

## 2023-07-21 DIAGNOSIS — G4733 Obstructive sleep apnea (adult) (pediatric): Secondary | ICD-10-CM | POA: Insufficient documentation

## 2023-07-21 DIAGNOSIS — Z86711 Personal history of pulmonary embolism: Secondary | ICD-10-CM | POA: Diagnosis not present

## 2023-07-21 DIAGNOSIS — Z86718 Personal history of other venous thrombosis and embolism: Secondary | ICD-10-CM | POA: Insufficient documentation

## 2023-07-21 DIAGNOSIS — E785 Hyperlipidemia, unspecified: Secondary | ICD-10-CM | POA: Diagnosis not present

## 2023-07-21 DIAGNOSIS — F32A Depression, unspecified: Secondary | ICD-10-CM | POA: Diagnosis not present

## 2023-07-21 DIAGNOSIS — Z8546 Personal history of malignant neoplasm of prostate: Secondary | ICD-10-CM | POA: Diagnosis present

## 2023-07-21 DIAGNOSIS — Z9079 Acquired absence of other genital organ(s): Secondary | ICD-10-CM | POA: Diagnosis not present

## 2023-07-21 DIAGNOSIS — Z79899 Other long term (current) drug therapy: Secondary | ICD-10-CM | POA: Insufficient documentation

## 2023-07-21 DIAGNOSIS — K219 Gastro-esophageal reflux disease without esophagitis: Secondary | ICD-10-CM | POA: Diagnosis not present

## 2023-07-21 DIAGNOSIS — C61 Malignant neoplasm of prostate: Secondary | ICD-10-CM | POA: Insufficient documentation

## 2023-07-21 DIAGNOSIS — Z7901 Long term (current) use of anticoagulants: Secondary | ICD-10-CM | POA: Diagnosis not present

## 2023-07-21 DIAGNOSIS — I10 Essential (primary) hypertension: Secondary | ICD-10-CM | POA: Diagnosis not present

## 2023-07-21 DIAGNOSIS — F419 Anxiety disorder, unspecified: Secondary | ICD-10-CM | POA: Insufficient documentation

## 2023-07-21 DIAGNOSIS — K760 Fatty (change of) liver, not elsewhere classified: Secondary | ICD-10-CM | POA: Insufficient documentation

## 2023-07-21 LAB — CMP (CANCER CENTER ONLY)
ALT: 71 U/L — ABNORMAL HIGH (ref 0–44)
AST: 45 U/L — ABNORMAL HIGH (ref 15–41)
Albumin: 4.5 g/dL (ref 3.5–5.0)
Alkaline Phosphatase: 62 U/L (ref 38–126)
Anion gap: 5 (ref 5–15)
BUN: 17 mg/dL (ref 8–23)
CO2: 35 mmol/L — ABNORMAL HIGH (ref 22–32)
Calcium: 9.9 mg/dL (ref 8.9–10.3)
Chloride: 100 mmol/L (ref 98–111)
Creatinine: 1.27 mg/dL — ABNORMAL HIGH (ref 0.61–1.24)
GFR, Estimated: 59 mL/min — ABNORMAL LOW (ref >60)
Glucose, Bld: 97 mg/dL (ref 70–99)
Potassium: 4 mmol/L (ref 3.5–5.1)
Sodium: 140 mmol/L (ref 135–145)
Total Bilirubin: 1.1 mg/dL (ref <1.2)
Total Protein: 7.1 g/dL (ref 6.5–8.1)

## 2023-07-21 LAB — CBC WITH DIFFERENTIAL (CANCER CENTER ONLY)
Abs Immature Granulocytes: 0.01 10*3/uL (ref 0.00–0.07)
Basophils Absolute: 0 10*3/uL (ref 0.0–0.1)
Basophils Relative: 1 %
Eosinophils Absolute: 0.1 10*3/uL (ref 0.0–0.5)
Eosinophils Relative: 2 %
HCT: 52 % (ref 39.0–52.0)
Hemoglobin: 16 g/dL (ref 13.0–17.0)
Immature Granulocytes: 0 %
Lymphocytes Relative: 26 %
Lymphs Abs: 1.7 10*3/uL (ref 0.7–4.0)
MCH: 23.7 pg — ABNORMAL LOW (ref 26.0–34.0)
MCHC: 30.8 g/dL (ref 30.0–36.0)
MCV: 77 fL — ABNORMAL LOW (ref 80.0–100.0)
Monocytes Absolute: 0.6 10*3/uL (ref 0.1–1.0)
Monocytes Relative: 9 %
Neutro Abs: 4.1 10*3/uL (ref 1.7–7.7)
Neutrophils Relative %: 62 %
Platelet Count: 172 10*3/uL (ref 150–400)
RBC: 6.75 MIL/uL — ABNORMAL HIGH (ref 4.22–5.81)
RDW: 18.4 % — ABNORMAL HIGH (ref 11.5–15.5)
WBC Count: 6.6 10*3/uL (ref 4.0–10.5)
nRBC: 0 % (ref 0.0–0.2)

## 2023-07-23 LAB — PROSTATE-SPECIFIC AG, SERUM (LABCORP): Prostate Specific Ag, Serum: <0.1 ng/mL (ref 0.0–4.0)

## 2023-07-27 ENCOUNTER — Inpatient Hospital Stay: Payer: Medicare Other | Admitting: Internal Medicine

## 2023-07-27 VITALS — BP 142/76 | HR 81 | Temp 98.3°F | Resp 17 | Ht 71.0 in | Wt 242.7 lb

## 2023-07-27 DIAGNOSIS — C61 Malignant neoplasm of prostate: Secondary | ICD-10-CM | POA: Diagnosis not present

## 2023-07-27 DIAGNOSIS — Z86718 Personal history of other venous thrombosis and embolism: Secondary | ICD-10-CM | POA: Diagnosis not present

## 2023-07-27 DIAGNOSIS — G4733 Obstructive sleep apnea (adult) (pediatric): Secondary | ICD-10-CM | POA: Diagnosis not present

## 2023-07-27 DIAGNOSIS — K219 Gastro-esophageal reflux disease without esophagitis: Secondary | ICD-10-CM | POA: Diagnosis not present

## 2023-07-27 DIAGNOSIS — Z86711 Personal history of pulmonary embolism: Secondary | ICD-10-CM | POA: Diagnosis not present

## 2023-07-27 DIAGNOSIS — Z8546 Personal history of malignant neoplasm of prostate: Secondary | ICD-10-CM | POA: Diagnosis not present

## 2023-07-27 DIAGNOSIS — K760 Fatty (change of) liver, not elsewhere classified: Secondary | ICD-10-CM | POA: Diagnosis not present

## 2023-07-27 DIAGNOSIS — Z7901 Long term (current) use of anticoagulants: Secondary | ICD-10-CM | POA: Diagnosis not present

## 2023-07-27 DIAGNOSIS — Z79899 Other long term (current) drug therapy: Secondary | ICD-10-CM | POA: Diagnosis not present

## 2023-07-27 DIAGNOSIS — E785 Hyperlipidemia, unspecified: Secondary | ICD-10-CM | POA: Diagnosis not present

## 2023-07-27 DIAGNOSIS — I10 Essential (primary) hypertension: Secondary | ICD-10-CM | POA: Diagnosis not present

## 2023-07-27 DIAGNOSIS — G2581 Restless legs syndrome: Secondary | ICD-10-CM | POA: Diagnosis not present

## 2023-07-27 NOTE — Progress Notes (Signed)
[facility] Telephone:([phone]   Fax:([phone]) [phone]  OFFICE PROGRESS NOTE  Merri Brunette, MD [address] [place] [address]  DIAGNOSIS: Prostate adenocarcinoma diagnosed in 2010 with Gleason score of 6 without any evidence of disease recurrence.  PRIOR THERAPY: Status post prostatectomy on October 20, 2008.  CURRENT THERAPY: Observation  INTERVAL HISTORY: [name] 77 y.o. male returns to the clinic today for annual follow-up visit and to establish care with me after his primary oncologist Dr. Clelia Croft left the practice year ago.Discussed the use of AI scribe software for clinical note transcription with the patient, who gave verbal consent to proceed.  History of Present Illness   The patient, a 77 year old with a history of prostate cancer, underwent a prostatectomy in March 2010. The Gleason score at the time of surgery was six, but was later revised to seven. Since the surgery, the patient has not received any hormonal therapy and reports no recurrence of the cancer.  In the past year, the patient has been feeling unwell, but without specific symptoms. He has been taking testosterone and glutathione, which he believes may be affecting his blood test results. The patient has noticed aches in the upper right quadrant and back, which he worries may be related to his liver or kidneys.  Additionally, the patient reports pain on the right side of his body, extending from the flank area downwards. Despite these issues, the patient generally feels okay. He has gained weight recently, which he attributes to the hormones he is taking. 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  The patient's blood pressure has been slightly high, but he attributes this to the stress of the medical visit. He usually monitors his blood pressure at home and reports it to be around 130/80.       MEDICAL HISTORY: Past Medical History:  Diagnosis Date    Antiphospholipid antibody syndrome (HCC) 05/22/2012   Found subsequent to recurrent PE 9/13   Arthritis    Bladder cancer (HCC) dx'd 2009   surg only   Chronic anal fissure    w/ recurrence's   Chronic anxiety    Depression    ED (erectile dysfunction)    Esophageal candidiasis (HCC) 03/14/2017   Fatty liver    GERD (gastroesophageal reflux disease)    Heart murmur    Hepatic steatosis    Hiatal hernia    History of adenomatous polyp of colon    2005   History of bladder cancer urologist-  dr Isabel Caprice   papillary TCC  s/p  turbt   History of DVT (deep vein thrombosis)    post prostate surgery 2010  right LLE   History of esophageal stricture    w/ dilation   History of gastric polyp    benign 2010   History of prostate cancer urologist- dr grapey/  oncology-  dr Clelia Croft   Stage  T1c , Gleason 3+3;   s/p  prostatectomy 03-08-210//  per pt currently PSA nondetectable    History of pulmonary embolus (PE)    post prostate surgery 2010  and 2013 x2 right side per CT scan (found to have lupus anticoaglated )   History of TIA (transient ischemic attack)    07/ 2012 noted on scans remote probable tia's in  age 77's   History of TMJ syndrome    HTN (hypertension)    Hyperlipidemia    Lupus anticoagulant positive    found 2013 when pt had  PE   Moderate aortic regurgitation    with no  stenosis    OSA on CPAP    per study severe osa 12-14-2010   Prolapsed internal hemorrhoids, grade 2 03/31/2014   prostate ca dx'd 2010   surg only   RLS (restless legs syndrome)    RLS (restless legs syndrome)    Sleep apnea    Symptomatic PVCs cardiologist-  dr berry   intermittant   Wears glasses     ALLERGIES:  is allergic to aspirin and penicillins.  MEDICATIONS:  Current Outpatient Medications  Medication Sig Dispense Refill   ALPRAZolam (XANAX) 1 MG tablet Take 1 tablet by mouth 2 (two) times daily.     co-enzyme Q-10 30 MG capsule Take 30 mg by mouth every evening.      Cyanocobalamin 1000 MCG/ML KIT Inject 1 mL as directed once a week.     fluticasone (FLONASE) 50 MCG/ACT nasal spray USE 2 SPRAYS IN EACH NOSTRIL EVERY NIGHT 48 mL 6   Glutathione 200 MG/ML SOLN Inject 1 mL into the muscle once a week.     hydrochlorothiazide (HYDRODIURIL) 25 MG tablet Take 25 mg by mouth daily.     hydrocortisone 2.5 % cream Apply 1 application topically 2 (two) times daily as needed.     Hypertonic Nasal Wash (SINUS RINSE BOTTLE KIT) PACK Place into the nose at bedtime.     Lidocaine-Hydrocortisone Ace 3-2.5 % KIT Place 1 application rectally daily as needed (hemorrhoids). 1 kit 3   metoprolol tartrate (LOPRESSOR) 100 MG tablet TAKE 1 TABLET (100 MG TOTAL) BY MOUTH 2 (TWO) TIMES DAILY. NEED OV. 180 tablet 2   Multiple Vitamin (MULTIVITAMIN) tablet Take 1 tablet by mouth daily.     oxymetazoline (AFRIN) 0.05 % nasal spray Place 2 sprays into the nose 2 (two) times daily.     pantoprazole (PROTONIX) 40 MG tablet TAKE 1 TABLET BY MOUTH EVERY DAY 90 tablet 3   ramipril (ALTACE) 10 MG capsule Take 1 capsule (10 mg total) by mouth 2 (two) times daily. 180 capsule 3   rivaroxaban (XARELTO) 20 MG TABS tablet Take 20 mg by mouth daily.     rosuvastatin (CRESTOR) 20 MG tablet Take 10 mg by mouth daily.     sertraline (ZOLOFT) 100 MG tablet Take 100 mg by mouth daily.     testosterone enanthate (DELATESTRYL) 200 MG/ML injection Inject into the muscle every 14 (fourteen) days. For IM use only     traMADol (ULTRAM) 50 MG tablet Take 1-2 tablets by mouth as needed.     VITAMIN D, ERGOCALCIFEROL, PO Take 5,000 Units by mouth daily.     Wheat Dextrin (BENEFIBER PO) Take 1 Dose by mouth 2 (two) times daily.     No current facility-administered medications for this visit.    SURGICAL HISTORY:  Past Surgical History:  Procedure Laterality Date   APPENDECTOMY  age 35   and Left Inguinal Hernia Repair   CARDIOVASCULAR STRESS TEST  06-30-2011   dr berry   normal nuclear study/  normal LV  function and wall motion, ef 56%   COLONOSCOPY  last one 10-19-2011   CYSTO/ BLADDER BX/  RETROGRADE PYELOGRAM/  TRANSRECTAL PROSTATE BX'S  07-30-2008   ESOPHAGOGASTRODUODENOSCOPY  last one 06-27-2011   EVALUATION UNDER ANESTHESIA WITH FISTULECTOMY N/A 01/14/2016   Procedure: EXAM UNDER ANESTHESIA WITH REPAIR OF PERIRECTAL FISTULA  (LIFT);  Surgeon: Karie Soda, MD;  Location: [facility];  Service: General;  Laterality: N/A;  LAPAROSCOPIC CHOLECYSTECTOMY  05-25-2007   and Excision cyst back of neck   ROBOT ASSISTED LAPAROSCOPIC RADICAL PROSTATECTOMY  10-20-2008   STRABISMUS SURGERY Bilateral age 72   TONSILLECTOMY  age 32   TRANSTHORACIC ECHOCARDIOGRAM  03-23-2015   dr berry   grade 1 diastolic dysfunction, ef 55-60%/  mild AV thickened leaflets with no stenosis,  moderate AR/  mild ascending aorta ilatation3.9cm/  trivial MR and TR   UPPER GASTROINTESTINAL ENDOSCOPY      REVIEW OF SYSTEMS:  Constitutional: negative Eyes: negative Ears, nose, mouth, throat, and face: negative Respiratory: negative Cardiovascular: negative Gastrointestinal: negative Genitourinary:negative Integument/breast: negative Hematologic/lymphatic: negative Musculoskeletal:negative Neurological: negative Behavioral/Psych: negative Endocrine: negative Allergic/Immunologic: negative   PHYSICAL EXAMINATION: General appearance: alert, cooperative, and no distress Head: Normocephalic, without obvious abnormality, atraumatic Neck: no adenopathy, no JVD, supple, symmetrical, trachea midline, and thyroid not enlarged, symmetric, no tenderness/mass/nodules Lymph nodes: Cervical, supraclavicular, and axillary nodes normal. Resp: clear to auscultation bilaterally Back: symmetric, no curvature. ROM normal. No CVA tenderness. Cardio: regular rate and rhythm, S1, S2 normal, no murmur, click, rub or gallop GI: soft, non-tender; bowel sounds normal; no masses,  no organomegaly Extremities: extremities  normal, atraumatic, no cyanosis or edema Neurologic: Alert and oriented X 3, normal strength and tone. Normal symmetric reflexes. Normal coordination and gait  ECOG PERFORMANCE STATUS: 1 - Symptomatic but completely ambulatory  Blood pressure (!) 142/76, pulse 81, temperature 98.3 F (36.8 C), temperature source Temporal, resp. rate 17, height 5\' 11"  (1.803 m), weight 242 lb 11.2 oz (110.1 kg), SpO2 97%.  LABORATORY DATA: Lab Results  Component Value Date   WBC 6.6 07/21/2023   HGB 16.0 07/21/2023   HCT 52.0 07/21/2023   MCV 77.0 (L) 07/21/2023   PLT 172 07/21/2023      Chemistry      Component Value Date/Time   NA 140 07/21/2023 1008   NA 142 04/18/2018 1324   NA 140 07/14/2017 0817   K 4.0 07/21/2023 1008   K 3.8 07/14/2017 0817   CL 100 07/21/2023 1008   CL 103 11/02/2012 0737   CO2 35 (H) 07/21/2023 1008   CO2 28 07/14/2017 0817   BUN 17 07/21/2023 1008   BUN 12 04/18/2018 1324   BUN 16.8 07/14/2017 0817   CREATININE 1.27 (H) 07/21/2023 1008   CREATININE 1.1 07/14/2017 0817      Component Value Date/Time   CALCIUM 9.9 07/21/2023 1008   CALCIUM 9.4 07/14/2017 0817   ALKPHOS 62 07/21/2023 1008   ALKPHOS 67 07/14/2017 0817   AST 45 (H) 07/21/2023 1008   AST 35 (H) 07/14/2017 0817   ALT 71 (H) 07/21/2023 1008   ALT 57 (H) 07/14/2017 0817   BILITOT 1.1 07/21/2023 1008   BILITOT 0.60 07/14/2017 0817       RADIOGRAPHIC STUDIES: No results found.  ASSESSMENT AND PLAN: This is a very pleasant 77 years old white male with Prostate adenocarcinoma diagnosed in 2010 with Gleason score of 6 without any evidence of disease recurrence.  He is status post prostatectomy on October 20, 2008. The patient is currently on observation.    Prostate Cancer (Post-Prostatectomy) Diagnosed with prostate cancer in March 2010. Underwent prostatectomy with a Gleason score initially at 6, closer to 7 post-surgery. No recurrence of cancer noted. PSA levels are less than 0.1, indicating no  current evidence of disease. - Continue annual follow-up visits - Monitor PSA levels annually  Testosterone Therapy On testosterone therapy, leading to increased erythrocytosis and weight gain. Liver enzymes (  AST 45, ALT 71) and serum creatinine (1.27) are slightly elevated, likely due to testosterone therapy. Reports upper right quadrant and back pain, not attributed to liver enzyme levels. MCV slightly low, possibly related to testosterone therapy. - Monitor liver enzymes and serum creatinine levels - Evaluate upper right quadrant and back pain if symptoms persist  Hypertension Blood pressure slightly elevated during the visit, possibly due to anxiety. Usual home readings of 130/80. - Monitor blood pressure regularly at home - Reassess blood pressure at the next visit  General Health Maintenance Generally in good health with no significant weight loss. Blood work shows good hemoglobin (16) and hematocrit (52) levels. - Continue routine blood work annually - Maintain a healthy lifestyle and monitor any new symptoms  Follow-up - Schedule next follow-up visit in one year.   He was advised to call immediately if he has any other concerning symptoms in the interval. The patient voices understanding of current disease status and treatment options and is in agreement with the current care plan.  All questions were answered. The patient knows to call the clinic with any problems, questions or concerns. We can certainly see the patient much sooner if necessary.  The total time spent in the appointment was 30 minutes.  Disclaimer: This note was dictated with voice recognition software. Similar sounding words can inadvertently be transcribed and may not be corrected upon review.

## 2023-08-11 DIAGNOSIS — L821 Other seborrheic keratosis: Secondary | ICD-10-CM | POA: Diagnosis not present

## 2023-08-11 DIAGNOSIS — B078 Other viral warts: Secondary | ICD-10-CM | POA: Diagnosis not present

## 2023-08-11 DIAGNOSIS — L573 Poikiloderma of Civatte: Secondary | ICD-10-CM | POA: Diagnosis not present

## 2023-09-21 DIAGNOSIS — R5383 Other fatigue: Secondary | ICD-10-CM | POA: Diagnosis not present

## 2023-09-21 DIAGNOSIS — R1011 Right upper quadrant pain: Secondary | ICD-10-CM | POA: Diagnosis not present

## 2023-09-21 DIAGNOSIS — R053 Chronic cough: Secondary | ICD-10-CM | POA: Diagnosis not present

## 2023-09-21 DIAGNOSIS — J3489 Other specified disorders of nose and nasal sinuses: Secondary | ICD-10-CM | POA: Diagnosis not present

## 2023-09-21 DIAGNOSIS — R0989 Other specified symptoms and signs involving the circulatory and respiratory systems: Secondary | ICD-10-CM | POA: Diagnosis not present

## 2023-09-21 DIAGNOSIS — R748 Abnormal levels of other serum enzymes: Secondary | ICD-10-CM | POA: Diagnosis not present

## 2023-09-27 ENCOUNTER — Encounter: Payer: Self-pay | Admitting: Internal Medicine

## 2023-10-02 ENCOUNTER — Telehealth: Payer: Self-pay | Admitting: Medical Oncology

## 2023-10-02 NOTE — Telephone Encounter (Signed)
 Pt said Dr Renne Crigler wants him to see Dr Arbutus Ped for " iron deficiency anemia". I requested records .  Pt notified Dr Arbutus Ped will review information and make appt.

## 2023-10-03 ENCOUNTER — Other Ambulatory Visit: Payer: Self-pay | Admitting: Medical Oncology

## 2023-10-04 ENCOUNTER — Telehealth: Payer: Self-pay | Admitting: Internal Medicine

## 2023-10-04 DIAGNOSIS — R1011 Right upper quadrant pain: Secondary | ICD-10-CM | POA: Diagnosis not present

## 2023-10-04 DIAGNOSIS — R162 Hepatomegaly with splenomegaly, not elsewhere classified: Secondary | ICD-10-CM | POA: Diagnosis not present

## 2023-10-04 DIAGNOSIS — K7689 Other specified diseases of liver: Secondary | ICD-10-CM | POA: Diagnosis not present

## 2023-10-05 ENCOUNTER — Other Ambulatory Visit: Payer: Self-pay | Admitting: Medical Oncology

## 2023-10-05 NOTE — Progress Notes (Signed)
 Pt scheduled for lab and Dr. Arbutus Ped on 10/26/2023.

## 2023-10-09 ENCOUNTER — Encounter: Payer: Self-pay | Admitting: Internal Medicine

## 2023-10-13 DIAGNOSIS — K760 Fatty (change of) liver, not elsewhere classified: Secondary | ICD-10-CM | POA: Diagnosis not present

## 2023-10-23 DIAGNOSIS — K760 Fatty (change of) liver, not elsewhere classified: Secondary | ICD-10-CM | POA: Diagnosis not present

## 2023-10-26 ENCOUNTER — Inpatient Hospital Stay

## 2023-10-26 ENCOUNTER — Inpatient Hospital Stay: Payer: Medicare Other | Attending: Internal Medicine

## 2023-10-26 ENCOUNTER — Other Ambulatory Visit: Payer: Self-pay | Admitting: Medical Oncology

## 2023-10-26 ENCOUNTER — Inpatient Hospital Stay: Payer: Medicare Other | Admitting: Internal Medicine

## 2023-10-26 VITALS — BP 143/74 | HR 69 | Temp 98.1°F | Resp 16 | Ht 71.0 in | Wt 240.0 lb

## 2023-10-26 DIAGNOSIS — Z9079 Acquired absence of other genital organ(s): Secondary | ICD-10-CM | POA: Insufficient documentation

## 2023-10-26 DIAGNOSIS — Z7901 Long term (current) use of anticoagulants: Secondary | ICD-10-CM | POA: Diagnosis not present

## 2023-10-26 DIAGNOSIS — K7400 Hepatic fibrosis, unspecified: Secondary | ICD-10-CM | POA: Diagnosis not present

## 2023-10-26 DIAGNOSIS — D6862 Lupus anticoagulant syndrome: Secondary | ICD-10-CM | POA: Diagnosis not present

## 2023-10-26 DIAGNOSIS — Z79899 Other long term (current) drug therapy: Secondary | ICD-10-CM | POA: Diagnosis not present

## 2023-10-26 DIAGNOSIS — D509 Iron deficiency anemia, unspecified: Secondary | ICD-10-CM | POA: Diagnosis not present

## 2023-10-26 DIAGNOSIS — K760 Fatty (change of) liver, not elsewhere classified: Secondary | ICD-10-CM | POA: Diagnosis not present

## 2023-10-26 DIAGNOSIS — E611 Iron deficiency: Secondary | ICD-10-CM | POA: Diagnosis not present

## 2023-10-26 DIAGNOSIS — Z8546 Personal history of malignant neoplasm of prostate: Secondary | ICD-10-CM | POA: Diagnosis present

## 2023-10-26 LAB — CBC WITH DIFFERENTIAL (CANCER CENTER ONLY)
Abs Immature Granulocytes: 0.02 10*3/uL (ref 0.00–0.07)
Basophils Absolute: 0 10*3/uL (ref 0.0–0.1)
Basophils Relative: 1 %
Eosinophils Absolute: 0.2 10*3/uL (ref 0.0–0.5)
Eosinophils Relative: 3 %
HCT: 50 % (ref 39.0–52.0)
Hemoglobin: 15.4 g/dL (ref 13.0–17.0)
Immature Granulocytes: 0 %
Lymphocytes Relative: 26 %
Lymphs Abs: 1.5 10*3/uL (ref 0.7–4.0)
MCH: 23.7 pg — ABNORMAL LOW (ref 26.0–34.0)
MCHC: 30.8 g/dL (ref 30.0–36.0)
MCV: 77 fL — ABNORMAL LOW (ref 80.0–100.0)
Monocytes Absolute: 0.5 10*3/uL (ref 0.1–1.0)
Monocytes Relative: 9 %
Neutro Abs: 3.6 10*3/uL (ref 1.7–7.7)
Neutrophils Relative %: 61 %
Platelet Count: 170 10*3/uL (ref 150–400)
RBC: 6.49 MIL/uL — ABNORMAL HIGH (ref 4.22–5.81)
RDW: 18.4 % — ABNORMAL HIGH (ref 11.5–15.5)
WBC Count: 5.9 10*3/uL (ref 4.0–10.5)
nRBC: 0 % (ref 0.0–0.2)

## 2023-10-26 LAB — IRON AND IRON BINDING CAPACITY (CC-WL,HP ONLY)
Iron: 48 ug/dL (ref 45–182)
Saturation Ratios: 10 % — ABNORMAL LOW (ref 17.9–39.5)
TIBC: 489 ug/dL — ABNORMAL HIGH (ref 250–450)
UIBC: 441 ug/dL — ABNORMAL HIGH (ref 117–376)

## 2023-10-26 LAB — CMP (CANCER CENTER ONLY)
ALT: 69 U/L — ABNORMAL HIGH (ref 0–44)
AST: 45 U/L — ABNORMAL HIGH (ref 15–41)
Albumin: 4.5 g/dL (ref 3.5–5.0)
Alkaline Phosphatase: 58 U/L (ref 38–126)
Anion gap: 3 — ABNORMAL LOW (ref 5–15)
BUN: 11 mg/dL (ref 8–23)
CO2: 36 mmol/L — ABNORMAL HIGH (ref 22–32)
Calcium: 9.4 mg/dL (ref 8.9–10.3)
Chloride: 100 mmol/L (ref 98–111)
Creatinine: 1.27 mg/dL — ABNORMAL HIGH (ref 0.61–1.24)
GFR, Estimated: 59 mL/min — ABNORMAL LOW (ref >60)
Glucose, Bld: 93 mg/dL (ref 70–99)
Potassium: 4 mmol/L (ref 3.5–5.1)
Sodium: 139 mmol/L (ref 135–145)
Total Bilirubin: 1.2 mg/dL (ref 0.0–1.2)
Total Protein: 7.1 g/dL (ref 6.5–8.1)

## 2023-10-26 LAB — FERRITIN: Ferritin: 13 ng/mL — ABNORMAL LOW (ref 24–336)

## 2023-10-26 NOTE — Progress Notes (Signed)
 [facility] [phone]   [phone]  OFFICE PROGRESS NOTE  Merri Brunette, MD [address] [place] [address]  DIAGNOSIS:  1) Prostate adenocarcinoma diagnosed in 2010 with Gleason score of 6 without any evidence of disease recurrence. 2) iron deficiency with microcytosis but no anemia  PRIOR THERAPY: Status post prostatectomy on October 20, 2008.  CURRENT THERAPY: Observation  INTERVAL HISTORY: [name] 78 y.o. male returns to the clinic today for annual follow-up visit. Discussed the use of AI scribe software for clinical note transcription with the patient, who gave verbal consent to proceed.  History of Present Illness   The patient is a 78 year old with prostate cancer and lupus anticoagulant who presents for hematology follow-up due to low iron saturation and concerns about blood thinners. He was referred by Dr. Renne Crigler for evaluation of low iron saturation and fluid in the chest.  He has low iron saturation at 12% despite a normal serum iron level of 52. Hemoglobin and hematocrit levels are normal, but there is an elevated red blood cell count and low mean corpuscular volume (MCV), indicating microcytic red blood cells. He is currently on testosterone therapy, which could be affecting his blood parameters. He is concerned about the possibility of thalassemia, given his [place] heritage, and has not been tested for it previously.  He has a history of lupus anticoagulant, previously diagnosed and monitored until it resolved. Despite this, he experienced two pulmonary embolisms and is currently on Xarelto 20 mg. He is inquiring about reducing the dose but is aware of the risks associated with blood clots.  His prostate cancer was first diagnosed in 2000 with a Gleason score of 6, for which he underwent prostatectomy. He was previously followed by Dr. Clelia Croft, who retired, and is now under the care of Dr. Renne Crigler, who  identified low iron saturation and fluid in the chest on a recent chest X-ray. A test for heart failure was negative.  He has a history of fatty liver, with a recent ultrasound indicating increased echogenicity compatible with hepatic steatosis and fibrosis, an enlarged liver at 16.5 cm, and a mildly enlarged spleen at 14.6 cm. He questions the accuracy of the spleen enlargement finding, as he cannot feel it physically. He is overweight, which could contribute to his liver condition.  His family history includes G6PD deficiency in both of his sons, with one son experiencing a severe reaction after consuming fava beans. He has been tested for G6PD deficiency himself, which returned normal results. He is concerned about his kidney function, noting that his creatinine has increased to 1.27, and is aware that GFR decreases with age.        MEDICAL HISTORY: Past Medical History:  Diagnosis Date   Antiphospholipid antibody syndrome (HCC) 05/22/2012   Found subsequent to recurrent PE 9/13   Arthritis    Bladder cancer (HCC) dx'd 2009   surg only   Chronic anal fissure    w/ recurrence's   Chronic anxiety    Depression    ED (erectile dysfunction)    Esophageal candidiasis (HCC) 03/14/2017   Fatty liver    GERD (gastroesophageal reflux disease)    Heart murmur    Hepatic steatosis    Hiatal hernia    History of adenomatous polyp of colon    2005   History of bladder cancer urologist-  dr Isabel Caprice   papillary TCC  s/p  turbt   History of DVT (  deep vein thrombosis)    post prostate surgery 2010  right LLE   History of esophageal stricture    w/ dilation   History of gastric polyp    benign 2010   History of prostate cancer urologist- dr grapey/  oncology-  dr Clelia Croft   Stage  T1c , Gleason 3+3;   s/p  prostatectomy 03-08-210//  per pt currently PSA nondetectable    History of pulmonary embolus (PE)    post prostate surgery 2010  and 2013 x2 right side per CT scan (found to have lupus  anticoaglated )   History of TIA (transient ischemic attack)    07/ 2012 noted on scans remote probable tia's in  age 81's   History of TMJ syndrome    HTN (hypertension)    Hyperlipidemia    Lupus anticoagulant positive    found 2013 when pt had PE   Moderate aortic regurgitation    with no  stenosis    OSA on CPAP    per study severe osa 12-14-2010   Prolapsed internal hemorrhoids, grade 2 03/31/2014   prostate ca dx'd 2010   surg only   RLS (restless legs syndrome)    RLS (restless legs syndrome)    Sleep apnea    Symptomatic PVCs cardiologist-  dr berry   intermittant   Wears glasses     ALLERGIES:  is allergic to aspirin and penicillins.  MEDICATIONS:  Current Outpatient Medications  Medication Sig Dispense Refill   ALPRAZolam (XANAX) 1 MG tablet Take 1 tablet by mouth 2 (two) times daily.     co-enzyme Q-10 30 MG capsule Take 30 mg by mouth every evening.     Cyanocobalamin 1000 MCG/ML KIT Inject 1 mL as directed once a week.     fluticasone (FLONASE) 50 MCG/ACT nasal spray USE 2 SPRAYS IN EACH NOSTRIL EVERY NIGHT 48 mL 6   Glutathione 200 MG/ML SOLN Inject 1 mL into the muscle once a week.     hydrochlorothiazide (HYDRODIURIL) 25 MG tablet Take 25 mg by mouth daily.     hydrocortisone 2.5 % cream Apply 1 application topically 2 (two) times daily as needed.     Hypertonic Nasal Wash (SINUS RINSE BOTTLE KIT) PACK Place into the nose at bedtime.     Lidocaine-Hydrocortisone Ace 3-2.5 % KIT Place 1 application rectally daily as needed (hemorrhoids). 1 kit 3   metoprolol tartrate (LOPRESSOR) 100 MG tablet TAKE 1 TABLET (100 MG TOTAL) BY MOUTH 2 (TWO) TIMES DAILY. NEED OV. 180 tablet 2   Multiple Vitamin (MULTIVITAMIN) tablet Take 1 tablet by mouth daily.     oxymetazoline (AFRIN) 0.05 % nasal spray Place 2 sprays into the nose 2 (two) times daily.     pantoprazole (PROTONIX) 40 MG tablet TAKE 1 TABLET BY MOUTH EVERY DAY 90 tablet 3   ramipril (ALTACE) 10 MG capsule Take 1  capsule (10 mg total) by mouth 2 (two) times daily. 180 capsule 3   rivaroxaban (XARELTO) 20 MG TABS tablet Take 20 mg by mouth daily.     rosuvastatin (CRESTOR) 20 MG tablet Take 10 mg by mouth daily.     sertraline (ZOLOFT) 100 MG tablet Take 100 mg by mouth daily.     testosterone enanthate (DELATESTRYL) 200 MG/ML injection Inject into the muscle every 14 (fourteen) days. For IM use only     traMADol (ULTRAM) 50 MG tablet Take 1-2 tablets by mouth as needed.     VITAMIN D, ERGOCALCIFEROL, PO Take 5,000  Units by mouth daily.     Wheat Dextrin (BENEFIBER PO) Take 1 Dose by mouth 2 (two) times daily.     No current facility-administered medications for this visit.    SURGICAL HISTORY:  Past Surgical History:  Procedure Laterality Date   APPENDECTOMY  age 41   and Left Inguinal Hernia Repair   CARDIOVASCULAR STRESS TEST  06-30-2011   dr berry   normal nuclear study/  normal LV function and wall motion, ef 56%   COLONOSCOPY  last one 10-19-2011   CYSTO/ BLADDER BX/  RETROGRADE PYELOGRAM/  TRANSRECTAL PROSTATE BX'S  07-30-2008   ESOPHAGOGASTRODUODENOSCOPY  last one 06-27-2011   EVALUATION UNDER ANESTHESIA WITH FISTULECTOMY N/A 01/14/2016   Procedure: EXAM UNDER ANESTHESIA WITH REPAIR OF PERIRECTAL FISTULA  (LIFT);  Surgeon: Karie Soda, MD;  Location: [facility];  Service: General;  Laterality: N/A;   LAPAROSCOPIC CHOLECYSTECTOMY  05-25-2007   and Excision cyst back of neck   ROBOT ASSISTED LAPAROSCOPIC RADICAL PROSTATECTOMY  10-20-2008   STRABISMUS SURGERY Bilateral age 71   TONSILLECTOMY  age 48   TRANSTHORACIC ECHOCARDIOGRAM  03-23-2015   dr berry   grade 1 diastolic dysfunction, ef 55-60%/  mild AV thickened leaflets with no stenosis,  moderate AR/  mild ascending aorta ilatation3.9cm/  trivial MR and TR   UPPER GASTROINTESTINAL ENDOSCOPY      REVIEW OF SYSTEMS:  Constitutional: negative Eyes: negative Ears, nose, mouth, throat, and face: negative Respiratory:  negative Cardiovascular: negative Gastrointestinal: negative Genitourinary:negative Integument/breast: negative Hematologic/lymphatic: negative Musculoskeletal:negative Neurological: negative Behavioral/Psych: negative Endocrine: negative Allergic/Immunologic: negative   PHYSICAL EXAMINATION: General appearance: alert, cooperative, and no distress Head: Normocephalic, without obvious abnormality, atraumatic Neck: no adenopathy, no JVD, supple, symmetrical, trachea midline, and thyroid not enlarged, symmetric, no tenderness/mass/nodules Lymph nodes: Cervical, supraclavicular, and axillary nodes normal. Resp: clear to auscultation bilaterally Back: symmetric, no curvature. ROM normal. No CVA tenderness. Cardio: regular rate and rhythm, S1, S2 normal, no murmur, click, rub or gallop GI: soft, non-tender; bowel sounds normal; no masses,  no organomegaly Extremities: extremities normal, atraumatic, no cyanosis or edema Neurologic: Alert and oriented X 3, normal strength and tone. Normal symmetric reflexes. Normal coordination and gait  ECOG PERFORMANCE STATUS: 1 - Symptomatic but completely ambulatory  Blood pressure (!) 143/74, pulse 69, temperature 98.1 F (36.7 C), temperature source Temporal, resp. rate 16, height 5\' 11"  (1.803 m), weight 240 lb (108.9 kg), SpO2 99%.  LABORATORY DATA: Lab Results  Component Value Date   WBC 5.9 10/26/2023   HGB 15.4 10/26/2023   HCT 50.0 10/26/2023   MCV 77.0 (L) 10/26/2023   PLT 170 10/26/2023      Chemistry      Component Value Date/Time   NA 140 07/21/2023 1008   NA 142 04/18/2018 1324   NA 140 07/14/2017 0817   K 4.0 07/21/2023 1008   K 3.8 07/14/2017 0817   CL 100 07/21/2023 1008   CL 103 11/02/2012 0737   CO2 35 (H) 07/21/2023 1008   CO2 28 07/14/2017 0817   BUN 17 07/21/2023 1008   BUN 12 04/18/2018 1324   BUN 16.8 07/14/2017 0817   CREATININE 1.27 (H) 07/21/2023 1008   CREATININE 1.1 07/14/2017 0817      Component Value  Date/Time   CALCIUM 9.9 07/21/2023 1008   CALCIUM 9.4 07/14/2017 0817   ALKPHOS 62 07/21/2023 1008   ALKPHOS 67 07/14/2017 0817   AST 45 (H) 07/21/2023 1008   AST 35 (H) 07/14/2017 1610  ALT 71 (H) 07/21/2023 1008   ALT 57 (H) 07/14/2017 0817   BILITOT 1.1 07/21/2023 1008   BILITOT 0.60 07/14/2017 0817       RADIOGRAPHIC STUDIES: No results found.  ASSESSMENT AND PLAN: This is a very pleasant 78 years old white male with Prostate adenocarcinoma diagnosed in 2010 with Gleason score of 6 without any evidence of disease recurrence.  He is status post prostatectomy on October 20, 2008.    Lupus anticoagulant with pulmonary embolism He has lupus anticoagulant with two pulmonary embolisms. Although the lupus anticoagulant resolved, the history of pulmonary embolism necessitates continued anticoagulation therapy. He is on Xarelto 20 mg and inquired about reducing the dose, but was advised against it due to the significant risk of recurrent, potentially life-threatening embolism. - Continue Xarelto 20 mg daily.  Iron deficiency He has low iron saturation at 12%, with normal serum iron at 52. Hemoglobin and hematocrit levels are normal, but MCV is low, indicating microcytic red blood cells. The low MCV could be due to increased red blood cell production from testosterone therapy, which can cause increased red blood cell mass and subsequent iron deficiency. He does not have anemia, but the low MCV and iron saturation warrant further investigation. There is a possibility of thalassemia minor due to his [place] descent, which could also contribute to the microcytic red blood cells. - Repeat iron studies to assess current levels. - Perform blood smear to evaluate red blood cell morphology. - Order hemoglobin electrophoresis to test for thalassemia minor.  Testosterone therapy effects He is on testosterone therapy, which has improved his well-being but has also led to increased red blood cell  production, elevated creatinine, and liver enzyme levels. These are known side effects of testosterone therapy. - Monitor red blood cell count, creatinine, and liver enzymes regularly.  Hepatic steatosis and splenomegaly He has hepatic steatosis and mild splenomegaly as indicated by ultrasound. The liver is enlarged at 16.5 cm, and the spleen is mildly enlarged at 14.6 cm. He is overweight, a common risk factor for hepatic steatosis. He is skeptical about the splenomegaly, but the ultrasound findings confirm it. Physical examination did not reveal palpable splenomegaly. - Monitor liver function and size. - Monitor spleen size and function.   The patient was advised to call immediately if she has any concerning symptoms in the interval. The patient voices understanding of current disease status and treatment options and is in agreement with the current care plan.  All questions were answered. The patient knows to call the clinic with any problems, questions or concerns. We can certainly see the patient much sooner if necessary.  The total time spent in the appointment was 30 minutes.  Disclaimer: This note was dictated with voice recognition software. Similar sounding words can inadvertently be transcribed and may not be corrected upon review.

## 2023-10-27 ENCOUNTER — Encounter: Payer: Self-pay | Admitting: Internal Medicine

## 2023-10-27 LAB — PSA, TOTAL AND FREE
PSA, Free Pct: UNDETERMINED %
PSA, Free: <0.02 ng/mL
Prostate Specific Ag, Serum: <0.1 ng/mL (ref 0.0–4.0)

## 2023-11-03 ENCOUNTER — Encounter: Payer: Self-pay | Admitting: Medical Oncology

## 2023-11-07 ENCOUNTER — Encounter: Payer: Self-pay | Admitting: Medical Oncology

## 2023-11-07 ENCOUNTER — Other Ambulatory Visit: Payer: Self-pay | Admitting: Internal Medicine

## 2023-11-07 DIAGNOSIS — D508 Other iron deficiency anemias: Secondary | ICD-10-CM

## 2023-11-07 DIAGNOSIS — D509 Iron deficiency anemia, unspecified: Secondary | ICD-10-CM | POA: Insufficient documentation

## 2023-11-08 ENCOUNTER — Telehealth: Payer: Self-pay | Admitting: Pharmacy Technician

## 2023-11-08 NOTE — Telephone Encounter (Signed)
 Auth Submission: NO AUTH NEEDED Site of care: Site of care: [facility] Payer: [employer] Medication & CPT/J Code(s) submitted: Venofer (Iron Sucrose) J1756 Route of submission (phone, fax, portal):  Phone # Fax # Auth type: Buy/Bill PB Units/visits requested: 3 DOSES Reference number:  Approval from: 11/08/23 to 04/09/24

## 2023-11-10 DIAGNOSIS — I10 Essential (primary) hypertension: Secondary | ICD-10-CM | POA: Diagnosis not present

## 2023-11-20 ENCOUNTER — Ambulatory Visit (INDEPENDENT_AMBULATORY_CARE_PROVIDER_SITE_OTHER): Admitting: *Deleted

## 2023-11-20 VITALS — BP 130/74 | HR 62 | Temp 98.0°F | Resp 16 | Ht 71.0 in | Wt 240.6 lb

## 2023-11-20 DIAGNOSIS — E611 Iron deficiency: Secondary | ICD-10-CM

## 2023-11-20 DIAGNOSIS — D508 Other iron deficiency anemias: Secondary | ICD-10-CM

## 2023-11-20 MED ORDER — SODIUM CHLORIDE 0.9 % IV SOLN
300.0000 mg | INTRAVENOUS | Status: DC
  Administered 2023-11-20: 300 mg via INTRAVENOUS
  Filled 2023-11-20: qty 15

## 2023-11-20 NOTE — Patient Instructions (Signed)

## 2023-11-20 NOTE — Progress Notes (Signed)
 Diagnosis: Iron Deficiency Anemia  Provider:  Chilton Greathouse MD  Procedure: IV Infusion  IV Type: Peripheral, IV Location: R Forearm  Venofer (Iron Sucrose), Dose: 300 mg  Infusion Start Time: 1355pm  Infusion Stop Time: 1556 pm  Post Infusion IV Care: Observation period completed and Peripheral IV Discontinued  Discharge: Condition: Good, Destination: Home . AVS Provided  Performed by:  Forrest Moron, RN

## 2023-11-29 ENCOUNTER — Ambulatory Visit

## 2023-11-30 ENCOUNTER — Ambulatory Visit (INDEPENDENT_AMBULATORY_CARE_PROVIDER_SITE_OTHER)

## 2023-11-30 VITALS — BP 124/65 | HR 66 | Temp 98.6°F | Resp 18 | Ht 71.0 in | Wt 244.0 lb

## 2023-11-30 DIAGNOSIS — D508 Other iron deficiency anemias: Secondary | ICD-10-CM

## 2023-11-30 DIAGNOSIS — E611 Iron deficiency: Secondary | ICD-10-CM | POA: Diagnosis not present

## 2023-11-30 MED ORDER — SODIUM CHLORIDE 0.9 % IV SOLN
300.0000 mg | INTRAVENOUS | Status: DC
  Administered 2023-11-30: 300 mg via INTRAVENOUS
  Filled 2023-11-30: qty 15

## 2023-11-30 NOTE — Progress Notes (Signed)
 Diagnosis: Iron Deficiency Anemia  Provider:  Praveen Mannam MD  Procedure: IV Infusion  IV Type: Peripheral, IV Location: R Antecubital  Venofer (Iron Sucrose), Dose: 300 mg  Infusion Start Time: 1335  Infusion Stop Time: 1517  Post Infusion IV Care: Observation period completed and Peripheral IV Discontinued  Discharge: Condition: Good, Destination: Home . AVS Declined  Performed by:  Lauran Pollard, LPN

## 2023-12-06 ENCOUNTER — Ambulatory Visit

## 2023-12-06 ENCOUNTER — Encounter: Payer: Self-pay | Admitting: Pulmonary Disease

## 2023-12-06 ENCOUNTER — Ambulatory Visit: Payer: Medicare Other | Admitting: Pulmonary Disease

## 2023-12-06 VITALS — BP 163/84 | HR 79 | Ht 71.0 in | Wt 244.0 lb

## 2023-12-06 DIAGNOSIS — J328 Other chronic sinusitis: Secondary | ICD-10-CM | POA: Diagnosis not present

## 2023-12-06 DIAGNOSIS — R053 Chronic cough: Secondary | ICD-10-CM

## 2023-12-06 DIAGNOSIS — Z720 Tobacco use: Secondary | ICD-10-CM | POA: Diagnosis not present

## 2023-12-06 DIAGNOSIS — G4733 Obstructive sleep apnea (adult) (pediatric): Secondary | ICD-10-CM | POA: Diagnosis not present

## 2023-12-06 DIAGNOSIS — R059 Cough, unspecified: Secondary | ICD-10-CM | POA: Diagnosis not present

## 2023-12-06 DIAGNOSIS — R0981 Nasal congestion: Secondary | ICD-10-CM

## 2023-12-06 MED ORDER — AZELASTINE-FLUTICASONE 137-50 MCG/ACT NA SUSP: 2.0000 | Freq: Every day | NASAL | 3 refills | Status: DC

## 2023-12-06 NOTE — Patient Instructions (Signed)
 I will see you back in about 8 weeks  Referral made for ENT for chronic sinusitis  Try and get off Afrin  Dymista  prescription sent to pharmacy for you -Combination of steroid like Flonase and azelastine  -If not covered or too expensive then getting individual medications may suffice  Continue using your CPAP on a regular basis  Call us  with significant concerns  Will message you about the findings on your x-ray

## 2023-12-06 NOTE — Progress Notes (Signed)
 [name]    [id number]    [birth year]  Primary Care Physician:[name], Marlou Sims, MD  Referring Physician: Imelda Man, MD [address] [place],  [place] [address]  Chief complaint:   Nasal stuffiness and congestion Abnormal chest x-ray  HPI:  Concerned about water in his lungs on a recent chest x-ray  Cough for about 8 months  History of obstructive sleep apnea on CPAP therapy Tolerating CPAP well with no significant problems  Suffers from nasal stuffiness congestion  Used to follow-up with ENT-his physician did retire - Will want to follow-up with ENT  Has been using Afrin for nasal stuffiness and congestion Flonase by itself did not help in the past  He does suffer from allergies  There was concern about heart failure by his BNP was normal, echo from September 2024 did show some diastolic dysfunction  Comorbidities include restless legs, past history of prostate cancer, history of antiphospholipid syndrome, bladder cancer, fatty liver  He does have a history of obstructive sleep apnea and-his CPAP is managed by his primary care doctor Just recently got a machine in July 2024, diagnosed with obstructive sleep apnea since 2008  Usually goes to bed by 11, falls asleep in good order about 2 awakenings Final wake up time about 10 AM  Doing well with his CPAP with no significant concerns  Outpatient Encounter Medications as of 12/06/2023  Medication Sig   ALPRAZolam (XANAX) 1 MG tablet Take 1 tablet by mouth 2 (two) times daily.   Azelastine -Fluticasone  (DYMISTA ) 137-50 MCG/ACT SUSP Place 2 sprays into the nose daily.   co-enzyme Q-10 30 MG capsule Take 30 mg by mouth every evening.   Cyanocobalamin 1000 MCG/ML KIT Inject 1 mL as directed once a week.   fluticasone  (FLONASE) 50 MCG/ACT nasal spray USE 2 SPRAYS IN EACH NOSTRIL EVERY NIGHT   Glutathione 200 MG/ML SOLN Inject 1 mL into the muscle once a week.   hydrochlorothiazide  (HYDRODIURIL) 25 MG tablet Take 25 mg by mouth daily.   hydrocortisone  2.5 % cream Apply 1 application topically 2 (two) times daily as needed.   Hypertonic Nasal Wash (SINUS RINSE BOTTLE KIT) PACK Place into the nose at bedtime.   ipratropium (ATROVENT) 0.06 % nasal spray Place 2 sprays into both nostrils 3 (three) times daily.   Lidocaine -Hydrocortisone  Ace 3-2.5 % KIT Place 1 application rectally daily as needed (hemorrhoids).   metoprolol  tartrate (LOPRESSOR ) 100 MG tablet TAKE 1 TABLET (100 MG TOTAL) BY MOUTH 2 (TWO) TIMES DAILY. NEED OV.   Multiple Vitamin (MULTIVITAMIN) tablet Take 1 tablet by mouth daily.   oxymetazoline (AFRIN) 0.05 % nasal spray Place 2 sprays into the nose 2 (two) times daily.   pantoprazole  (PROTONIX ) 40 MG tablet TAKE 1 TABLET BY MOUTH EVERY DAY   ramipril  (ALTACE ) 10 MG capsule Take 1 capsule (10 mg total) by mouth 2 (two) times daily.   rivaroxaban (XARELTO) 20 MG TABS tablet Take 20 mg by mouth daily.   rosuvastatin  (CRESTOR ) 20 MG tablet Take 10 mg by mouth daily.   sertraline (ZOLOFT) 100 MG tablet Take 100 mg by mouth daily.   sildenafil (VIAGRA) 100 MG tablet Take 100 mg by mouth as needed.   testosterone enanthate (DELATESTRYL) 200 MG/ML injection Inject into the muscle every 14 (fourteen) days. For IM use only   traMADol (ULTRAM) 50 MG tablet Take 1-2 tablets by mouth as needed.   VITAMIN D, ERGOCALCIFEROL, PO Take 5,000 Units by  mouth daily.   Wheat Dextrin (BENEFIBER PO) Take 1 Dose by mouth 2 (two) times daily.   No facility-administered encounter medications on file as of 12/06/2023.    Allergies as of 12/06/2023 - Review Complete 12/06/2023  Allergen Reaction Noted   Aspirin Other (See Comments) 07/15/2015   Penicillins Itching 07/15/2015    Past Medical History:  Diagnosis Date   Antiphospholipid antibody syndrome (HCC) 05/22/2012   Found subsequent to recurrent PE 9/13   Arthritis    Bladder cancer (HCC) dx'd 2009   surg only   Chronic  anal fissure    w/ recurrence's   Chronic anxiety    Depression    ED (erectile dysfunction)    Esophageal candidiasis (HCC) 03/14/2017   Fatty liver    GERD (gastroesophageal reflux disease)    Heart murmur    Hepatic steatosis    Hiatal hernia    History of adenomatous polyp of colon    2005   History of bladder cancer urologist-  dr Bosie Bye   papillary TCC  s/p  turbt   History of DVT (deep vein thrombosis)    post prostate surgery 2010  right LLE   History of esophageal stricture    w/ dilation   History of gastric polyp    benign 2010   History of prostate cancer urologist- dr grapey/  oncology-  dr Dirk Fredericks   Stage  T1c , Gleason 3+3;   s/p  prostatectomy 03-08-210//  per pt currently PSA nondetectable    History of pulmonary embolus (PE)    post prostate surgery 2010  and 2013 x2 right side per CT scan (found to have lupus anticoaglated )   History of TIA (transient ischemic attack)    07/ 2012 noted on scans remote probable tia's in  age 67's   History of TMJ syndrome    HTN (hypertension)    Hyperlipidemia    Lupus anticoagulant positive    found 2013 when pt had PE   Moderate aortic regurgitation    with no  stenosis    OSA on CPAP    per study severe osa 12-14-2010   Prolapsed internal hemorrhoids, grade 2 03/31/2014   prostate ca dx'd 2010   surg only   RLS (restless legs syndrome)    RLS (restless legs syndrome)    Sleep apnea    Symptomatic PVCs cardiologist-  dr berry   intermittant   Wears glasses     Past Surgical History:  Procedure Laterality Date   APPENDECTOMY  age 33   and Left Inguinal Hernia Repair   CARDIOVASCULAR STRESS TEST  06-30-2011   dr berry   normal nuclear study/  normal LV function and wall motion, ef 56%   COLONOSCOPY  last one 10-19-2011   CYSTO/ BLADDER BX/  RETROGRADE PYELOGRAM/  TRANSRECTAL PROSTATE BX'S  07-30-2008   ESOPHAGOGASTRODUODENOSCOPY  last one 06-27-2011   EVALUATION UNDER ANESTHESIA WITH FISTULECTOMY N/A 01/14/2016    Procedure: EXAM UNDER ANESTHESIA WITH REPAIR OF PERIRECTAL FISTULA  (LIFT);  Surgeon: Candyce Champagne, MD;  Location: [facility];  Service: General;  Laterality: N/A;   LAPAROSCOPIC CHOLECYSTECTOMY  05-25-2007   and Excision cyst back of neck   ROBOT ASSISTED LAPAROSCOPIC RADICAL PROSTATECTOMY  10-20-2008   STRABISMUS SURGERY Bilateral age 61   TONSILLECTOMY  age 65   TRANSTHORACIC ECHOCARDIOGRAM  03-23-2015   dr berry   grade 1 diastolic dysfunction, ef 55-60%/  mild AV thickened leaflets with no stenosis,  moderate AR/  mild ascending aorta ilatation3.9cm/  trivial MR and TR   UPPER GASTROINTESTINAL ENDOSCOPY      Family History  Problem Relation Age of Onset   Hypertension Mother    Hypertension Father    Stroke Father    Colon polyps Father    Colon cancer Father        questionable   Colon polyps Brother    Crohn's disease Brother    Diabetes Maternal Grandmother    Heart disease Maternal Grandmother    Cirrhosis Other        Great Uncle   Esophageal cancer Neg Hx    Rectal cancer Neg Hx    Stomach cancer Neg Hx     Social History   Socioeconomic History   Marital status: Married    Spouse name: cathy   Number of children: 2   Years of education: Not on file   Highest education level: Not on file  Occupational History   Occupation: Acupuncturist   Occupation: [employer]    Employer: [employer]  Tobacco Use   Smoking status: Some Days    Types: Cigars   Smokeless tobacco: Never   Tobacco comments:    5 cigars /week--/  smoked cigarettes for 10 years quit 1990's  Vaping Use   Vaping status: Never Used  Substance and Sexual Activity   Alcohol use: Yes    Alcohol/week: 28.0 standard drinks of alcohol    Types: 14 Glasses of wine, 14 Cans of beer per week    Comment: 2 beers a day   Drug use: No   Sexual activity: Not on file  Other Topics Concern   Not on file  Social History Narrative   Married to [place])   Lives  with wife   Gets regular exercise.   Daily caffeine- 2 cups daily.   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   Respiratory:  Positive for cough.     Vitals:   12/06/23 1535  BP: (!) 163/84  Pulse: 79  SpO2: 96%     Physical Exam Constitutional:      Appearance: He is obese.  HENT:     Head: Normocephalic.     Nose: Nose normal.     Mouth/Throat:     Mouth: Mucous membranes are moist.  Eyes:     General: No scleral icterus. Cardiovascular:     Rate and Rhythm: Normal rate and regular rhythm.     Heart sounds: No murmur heard.    No friction rub.  Pulmonary:     Effort: No respiratory distress.     Breath sounds: No stridor. No wheezing or rhonchi.  Musculoskeletal:     Cervical back: No rigidity or tenderness.  Neurological:     Mental Status: He is alert.  Psychiatric:        Mood and Affect: Mood normal.      Data Reviewed: CPAP compliance shows excellent compliance of 100% Average use  of 9 hours 3 minutes AutoSet 5-20 Residual AHI of 3.6  Last echocardiogram 05/05/2023 with diastolic dysfunction, ejection fraction of 55 to 60%  Assessment:  Obstructive sleep apnea - Adequately treated - His primary care doctor has been following his CPAP care  Chronic recurrent sinusitis - Was following up with ENT - Made referral for ENT evaluation Does not feel he has an active infection at present  Nasal congestion and stuffiness - Has been using Afrin - Flonase alone by itself did not help in the past -  Prescription for Dymista  was placed  Plan/Recommendations: Referral to ENT  Prescription for Dymista   Encouraged to continue using CPAP on a nightly basis  Nasal rinses may help  Will obtain a chest x-ray today  Follow-up in about 8 weeks  Try and wean off Afrin   Myer Artis MD [facility] 12/06/2023, 3:58 PM  CC: Imelda Man, MD

## 2023-12-07 ENCOUNTER — Encounter: Payer: Self-pay | Admitting: Internal Medicine

## 2023-12-07 ENCOUNTER — Telehealth: Payer: Self-pay

## 2023-12-07 ENCOUNTER — Encounter: Payer: Self-pay | Admitting: Pulmonary Disease

## 2023-12-07 ENCOUNTER — Other Ambulatory Visit (INDEPENDENT_AMBULATORY_CARE_PROVIDER_SITE_OTHER)

## 2023-12-07 ENCOUNTER — Ambulatory Visit: Payer: Medicare Other | Admitting: Internal Medicine

## 2023-12-07 VITALS — BP 152/82 | HR 74 | Ht 71.0 in | Wt 239.0 lb

## 2023-12-07 DIAGNOSIS — R162 Hepatomegaly with splenomegaly, not elsewhere classified: Secondary | ICD-10-CM

## 2023-12-07 DIAGNOSIS — D508 Other iron deficiency anemias: Secondary | ICD-10-CM

## 2023-12-07 DIAGNOSIS — Z7901 Long term (current) use of anticoagulants: Secondary | ICD-10-CM

## 2023-12-07 DIAGNOSIS — K760 Fatty (change of) liver, not elsewhere classified: Secondary | ICD-10-CM

## 2023-12-07 DIAGNOSIS — E8881 Metabolic syndrome: Secondary | ICD-10-CM | POA: Diagnosis not present

## 2023-12-07 DIAGNOSIS — R1011 Right upper quadrant pain: Secondary | ICD-10-CM | POA: Diagnosis not present

## 2023-12-07 DIAGNOSIS — E669 Obesity, unspecified: Secondary | ICD-10-CM

## 2023-12-07 LAB — PROTIME-INR
INR: 1.4 ratio — ABNORMAL HIGH (ref 0.8–1.0)
Prothrombin Time: 14.5 s — ABNORMAL HIGH (ref 9.6–13.1)

## 2023-12-07 MED ORDER — NA SULFATE-K SULFATE-MG SULF 17.5-3.13-1.6 GM/177ML PO SOLN: 1.0000 | Freq: Once | ORAL | 0 refills | Status: AC

## 2023-12-07 NOTE — Telephone Encounter (Signed)
 [facility] Pre-operative Risk Assessment     Request for surgical clearance:     Endoscopy Procedure  What type of surgery is being performed?     EGD & Colonoscopy  When is this surgery scheduled?     01/04/2024  What type of clearance is required ?   Pharmacy  Are there any medications that need to be held prior to surgery and how long? Xarelto, 2 days  Practice name and name of physician performing surgery?      [facility]  What is your office phone and fax number?      Phone- [phone]  Fax- [phone] or [phone]  Anesthesia type (None, local, MAC, general) ?       MAC   Please route your response to Mamoru Takeshita [place], CMA (AAMA)

## 2023-12-07 NOTE — Progress Notes (Addendum)
 [name] 77 y.o. June 19, 1946 [id number]  Assessment & Plan:   Encounter Diagnoses  Name Primary?   Metabolic dysfunction-associated fatty liver disease (MAFLD) Yes   Hepatosplenomegaly    Abdominal pain, right upper quadrant    Other iron  deficiency anemia    Abdominal obesity and metabolic syndrome    Fib 4 calculation based upon available labs is 2.39 and additional investigation is recommended.  Will perform hepatic elastography.  Further plans pending that.  Schedule EGD and colonoscopy because of new iron  deficiency.  Hold Xarelto 2 days prior.  Will clarify with prescriber Dr. Liam Redhead.  Patient understands there is a rare but real risk of blood clot off medication in the usual risks of upper endoscopy and colonoscopy were reviewed in detail with the patient, he has reviewed the informed consent information.  Will go ahead and do an expanded serologic workup due to abnormal transaminases though I do think this is fatty liver disease.  He does not drink much alcohol but is advised abstinence is recommended.  Orders Placed This Encounter  Procedures   US  ABDOMEN COMPLETE W/ELASTOGRAPHY   ANA   Hepatitis A antibody, total   Hepatitis C antibody   Hepatitis B core antibody, total   Hepatitis B surface antibody,qualitative   Hepatitis B surface antigen   Mitochondrial antibodies   Alpha-1-antitrypsin   Anti-smooth muscle antibody, IgG   IgA   IgG   Protime-INR   Tissue transglutaminase, IgA   Ambulatory referral to Gastroenterology    NOTE that he is on Eliquis so INR will not be accurate  I have previously reviewed and reviewed again the need for weight loss.  This is challenging and I acknowledge that.  Depending upon what we find out perhaps more aggressive measures like enrolling with a program that could help the patient with low-carb diet, [place] [facility] as an example. ? Rezdiffra as an option if he qualifies.  I appreciate the opportunity to care for this  patient. CC: Imelda Man, MD Dr. Liam Redhead  Subjective:   Chief Complaint: Right upper quadrant pain abnormal liver and spleen on ultrasound, fatty liver  HPI 78 year old man with a history of IBS, hepatic steatosis, chronic recurrent abdominal and pelvic pain issues, history of colon polyps and possible family history of colon cancer in his elderly father (suspected), antiphospholipid antibody syndrome on Xarelto, and a history of prostate cancer.  I last saw him in February 2023 with pelvic pain and generalized abdominal pain and IBS-C issues.   [name] was having some right upper quadrant pain and spoke to primary care Dr. Schuyler Custard about this and an abdominal ultrasound was ordered which showed mild hepatomegaly (16.5 cm) and splenomegaly (14.5 cm) as well as known hepatic steatosis.  He is here for evaluation of this.  Abdominal pain was not severe.  He feels funny in his upper abdomen at times.  Sometimes he will have regurgitation of food but no dysphagia.  He tries to modify his diet and lose weight but is unable to.  He saw Dr. Liam Redhead for iron  deficiency without anemia recently.  He has not noted any change in bowel habits or bleeding or different abdominal pains other than that mentioned above.  Maybe 2 drinks a week never heavy as far as alcohol is concerned.  Does not eat much red meat.  Does not donate blood.  He is on testosterone therapy.  He reports his twin brother has similar health problems.  In 2018 there was a question about  splenomegaly on a CT of the chest, CT of the abdomen and pelvis did not confirm that.  He has had abnormal transaminases over the years which we attributed to fatty liver disease he had a negative ANA in 2018.  We have not done an extensive serologic workup.   Today he reports that his son in his 79s had to have a cardiac stent placed. Lab Results  Component Value Date   IRON  48 10/26/2023   TIBC 489 (H) 10/26/2023   FERRITIN 13 (L) 10/26/2023    Lab Results  Component Value Date   WBC 5.9 10/26/2023   HGB 15.4 10/26/2023   HCT 50.0 10/26/2023   MCV 77.0 (L) 10/26/2023   PLT 170 10/26/2023     Chemistry      Component Value Date/Time   NA 139 10/26/2023 0916   NA 142 04/18/2018 1324   NA 140 07/14/2017 0817   K 4.0 10/26/2023 0916   K 3.8 07/14/2017 0817   CL 100 10/26/2023 0916   CL 103 11/02/2012 0737   CO2 36 (H) 10/26/2023 0916   CO2 28 07/14/2017 0817   BUN 11 10/26/2023 0916   BUN 12 04/18/2018 1324   BUN 16.8 07/14/2017 0817   CREATININE 1.27 (H) 10/26/2023 0916   CREATININE 1.1 07/14/2017 0817      Component Value Date/Time   CALCIUM  9.4 10/26/2023 0916   CALCIUM  9.4 07/14/2017 0817   ALKPHOS 58 10/26/2023 0916   ALKPHOS 67 07/14/2017 0817   AST 45 (H) 10/26/2023 0916   AST 35 (H) 07/14/2017 0817   ALT 69 (H) 10/26/2023 0916   ALT 57 (H) 07/14/2017 0817   BILITOT 1.2 10/26/2023 0916   BILITOT 0.60 07/14/2017 0817       Wt Readings from Last 3 Encounters:  12/07/23 239 lb (108.4 kg)  12/06/23 244 lb (110.7 kg)  11/30/23 244 lb (110.7 kg)   EGD 03/30/2020  1. Surgical [P], gastric antrum erosions - ANTRAL MUCOSA WITH FOCAL EROSION. Jhon Moselle NEGATIVE FOR HELICOBACTER PYLORI. - NO INTESTINAL METAPLASIA, DYSPLASIA, OR CARCINOMA. 2. Surgical [P], esophageal plaques - BENIGN SQUAMOUS MUCOSA. - PAS STAIN NEGATIVE FOR FUNGUS. - NO SIGNIFICANT INFLAMMATORY CHANGES, INTESTINAL METAPLASIA OR CARCINOMA. 3. Surgical [P], proximal esophagus abnormal mucosa - BENIGN SQUAMOUS AND GASTRIC TYPE MUCOSA CONSISTENT WITH GASTRIC HETEROTOPIA (INLET PATCH). - NO INTESTINAL METAPLASIA, DYSPLASIA, OR CARCINOMA.  Colonoscopy 07/17/2019  Surgical [P], colon, transverse, sigmoid, polyp (2) - TUBULAR ADENOMA (X2 FRAGMENTS). - NO HIGH GRADE DYSPLASIA OR MALIGNANCY.  Allergies  Allergen Reactions   Aspirin Other (See Comments)    REACTION: gi upset with higher dose   Penicillins Itching   Current Meds   Medication Sig   ALPRAZolam (XANAX) 1 MG tablet Take 1 tablet by mouth 2 (two) times daily.   Azelastine -Fluticasone  (DYMISTA ) 137-50 MCG/ACT SUSP Place 2 sprays into the nose daily.   co-enzyme Q-10 30 MG capsule Take 30 mg by mouth every evening.   Cyanocobalamin 1000 MCG/ML KIT Inject 1 mL as directed once a week.   fluticasone  (FLONASE) 50 MCG/ACT nasal spray USE 2 SPRAYS IN EACH NOSTRIL EVERY NIGHT   Glutathione 200 MG/ML SOLN Inject 1 mL into the muscle once a week.   hydrochlorothiazide (HYDRODIURIL) 25 MG tablet Take 25 mg by mouth daily.   hydrocortisone  2.5 % cream Apply 1 application topically 2 (two) times daily as needed.   Hypertonic Nasal Wash (SINUS RINSE BOTTLE KIT) PACK Place into the nose at bedtime.   ipratropium (ATROVENT)  0.06 % nasal spray Place 2 sprays into both nostrils 3 (three) times daily.   Lidocaine -Hydrocortisone  Ace 3-2.5 % KIT Place 1 application rectally daily as needed (hemorrhoids).   metoprolol  tartrate (LOPRESSOR ) 100 MG tablet TAKE 1 TABLET (100 MG TOTAL) BY MOUTH 2 (TWO) TIMES DAILY. NEED OV.   Multiple Vitamin (MULTIVITAMIN) tablet Take 1 tablet by mouth daily.   Na Sulfate-K Sulfate-Mg Sulfate concentrate (SUPREP) 17.5-3.13-1.6 GM/177ML SOLN Take 1 kit (354 mLs total) by mouth once for 1 dose.   oxymetazoline (AFRIN) 0.05 % nasal spray Place 2 sprays into the nose 2 (two) times daily.   pantoprazole  (PROTONIX ) 40 MG tablet TAKE 1 TABLET BY MOUTH EVERY DAY   ramipril  (ALTACE ) 10 MG capsule Take 1 capsule (10 mg total) by mouth 2 (two) times daily.   rivaroxaban (XARELTO) 20 MG TABS tablet Take 20 mg by mouth daily.   rosuvastatin  (CRESTOR ) 20 MG tablet Take 10 mg by mouth daily.   sertraline (ZOLOFT) 100 MG tablet Take 100 mg by mouth daily.   sildenafil (VIAGRA) 100 MG tablet Take 100 mg by mouth as needed.   testosterone enanthate (DELATESTRYL) 200 MG/ML injection Inject into the muscle once a week. For IM use only   traMADol (ULTRAM) 50 MG tablet  Take 1-2 tablets by mouth as needed.   VITAMIN D, ERGOCALCIFEROL, PO Take 5,000 Units by mouth daily.   Wheat Dextrin (BENEFIBER PO) Take 1 Dose by mouth 2 (two) times daily.   Past Medical History:  Diagnosis Date   Antiphospholipid antibody syndrome (HCC) 05/22/2012   Found subsequent to recurrent PE 9/13   Arthritis    Bladder cancer (HCC) dx'd 2009   surg only   Chronic anal fissure    w/ recurrence's   Chronic anxiety    Depression    ED (erectile dysfunction)    Esophageal candidiasis (HCC) 03/14/2017   Fatty liver    GERD (gastroesophageal reflux disease)    Heart murmur    Hepatic steatosis    Hiatal hernia    History of adenomatous polyp of colon    2005   History of bladder cancer urologist-  dr Bosie Bye   papillary TCC  s/p  turbt   History of DVT (deep vein thrombosis)    post prostate surgery 2010  right LLE   History of esophageal stricture    w/ dilation   History of gastric polyp    benign 2010   History of prostate cancer urologist- dr grapey/  oncology-  dr Dirk Fredericks   Stage  T1c , Gleason 3+3;   s/p  prostatectomy 03-08-210//  per pt currently PSA nondetectable    History of pulmonary embolus (PE)    post prostate surgery 2010  and 2013 x2 right side per CT scan (found to have lupus anticoaglated )   History of TIA (transient ischemic attack)    07/ 2012 noted on scans remote probable tia's in  age 34's   History of TMJ syndrome    HTN (hypertension)    Hyperlipidemia    IDA (iron  deficiency anemia)    getting iron  IV   Lupus anticoagulant positive    found 2013 when pt had PE   Moderate aortic regurgitation    with no  stenosis    OSA on CPAP    per study severe osa 12-14-2010   Prolapsed internal hemorrhoids, grade 2 03/31/2014   prostate ca dx'd 2010   surg only   RLS (restless legs syndrome)  Sleep apnea    Symptomatic PVCs cardiologist-  dr berry   intermittant   Wears glasses    Past Surgical History:  Procedure Laterality Date    APPENDECTOMY  age 89   and Left Inguinal Hernia Repair   CARDIOVASCULAR STRESS TEST  06-30-2011   dr berry   normal nuclear study/  normal LV function and wall motion, ef 56%   COLONOSCOPY  last one 10-19-2011   CYSTO/ BLADDER BX/  RETROGRADE PYELOGRAM/  TRANSRECTAL PROSTATE BX'S  07-30-2008   ESOPHAGOGASTRODUODENOSCOPY  last one 06-27-2011   EVALUATION UNDER ANESTHESIA WITH FISTULECTOMY N/A 01/14/2016   Procedure: EXAM UNDER ANESTHESIA WITH REPAIR OF PERIRECTAL FISTULA  (LIFT);  Surgeon: Candyce Champagne, MD;  Location: [facility];  Service: General;  Laterality: N/A;   LAPAROSCOPIC CHOLECYSTECTOMY  05-25-2007   and Excision cyst back of neck   ROBOT ASSISTED LAPAROSCOPIC RADICAL PROSTATECTOMY  10-20-2008   STRABISMUS SURGERY Bilateral age 17   TONSILLECTOMY  age 63   TRANSTHORACIC ECHOCARDIOGRAM  03-23-2015   dr berry   grade 1 diastolic dysfunction, ef 55-60%/  mild AV thickened leaflets with no stenosis,  moderate AR/  mild ascending aorta ilatation3.9cm/  trivial MR and TR   UPPER GASTROINTESTINAL ENDOSCOPY     Social History   Social History Narrative   Married to [place])   Lives with wife   Gets regular exercise.   Daily caffeine- 2 cups daily.   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   Review of Systems As per HPI Saw pulmonary for chronic cough yesterday.  Note reviewed.  He has been referred to ENT.  Recommended to wean off Afrin.  Recommended to use his CPAP every night.  Objective:  @BP  (!) 152/82   Pulse 74    Ht 5\' 11"  (1.803 m)   Wt 239 lb (108.4 kg)   BMI 33.33 kg/m @  General:  Well-developed, well-nourished and in no acute distress Eyes:  anicteric..  Lungs: Clear to auscultation bilaterally. Heart:   S1S2, no rubs, murmurs, gallops. Abdomen: Obese soft, non-tender, no hepatosplenomegaly, hernia, or mass and BS+.  Extremities:   no edema, cyanosis or clubbing Skin  No stigmata of chronic liver disease Neuro:  A&O x 3.  Psych:  appropriate mood and  Affect.   Data Reviewed: See HPI

## 2023-12-07 NOTE — Patient Instructions (Signed)
 You have been scheduled for an endoscopy and colonoscopy. Please follow the written instructions given to you at your visit today.  If you use inhalers (even only as needed), please bring them with you on the day of your procedure.  DO NOT TAKE 7 DAYS PRIOR TO TEST- Trulicity (dulaglutide) Ozempic, Wegovy (semaglutide) Mounjaro (tirzepatide) Bydureon Bcise (exanatide extended release)  DO NOT TAKE 1 DAY PRIOR TO YOUR TEST Rybelsus (semaglutide) Adlyxin (lixisenatide) Victoza (liraglutide) Byetta (exanatide) ___________________________________________________________________________  Elene Griffes will be contaced by our office prior to your procedure for directions on holding your Xarelto.  If you do not hear from our office 1 week prior to your scheduled procedure, please call [phone] to discuss.  Your provider has requested that you go to the basement level for lab work before leaving today. Press "B" on the elevator. The lab is located at the first door on the left as you exit the elevator.  Due to recent changes in healthcare laws, you may see the results of your imaging and laboratory studies on MyChart before your provider has had a chance to review them.  We understand that in some cases there may be results that are confusing or concerning to you. Not all laboratory results come back in the same time frame and the provider may be waiting for multiple results in order to interpret others.  Please give us  48 hours in order for your provider to thoroughly review all the results before contacting the office for clarification of your results.   You have been scheduled for an abdominal ultrasound at [facility] (1st floor of hospital) on 12/12/2023 at 8:30am. Please arrive 15 minutes prior to your appointment for registration. Make certain not to have anything to eat or drink 6 hours prior to your appointment. Should you need to reschedule your appointment, please contact radiology at  [phone]. This test typically takes about 30 minutes to perform.  I appreciate the opportunity to care for you. Loy Ruff, MD, [facility]

## 2023-12-08 ENCOUNTER — Ambulatory Visit

## 2023-12-08 VITALS — BP 121/67 | HR 65 | Temp 97.9°F | Resp 18 | Ht 71.0 in | Wt 244.2 lb

## 2023-12-08 DIAGNOSIS — D508 Other iron deficiency anemias: Secondary | ICD-10-CM

## 2023-12-08 DIAGNOSIS — E611 Iron deficiency: Secondary | ICD-10-CM

## 2023-12-08 MED ORDER — SODIUM CHLORIDE 0.9 % IV SOLN
300.0000 mg | INTRAVENOUS | Status: DC
  Administered 2023-12-08: 300 mg via INTRAVENOUS
  Filled 2023-12-08: qty 10

## 2023-12-08 NOTE — Telephone Encounter (Signed)
Request has been forwarded to pcp.

## 2023-12-08 NOTE — Telephone Encounter (Signed)
 Preoperative team, patient's anticoagulation is dosed by his PCP.  Recommendations for holding anticoagulation for his upcoming surgery will need to come from primary care provider.  Please contact patient and requesting office.  Thank you for your help.  Chet Cota. Tuck Dulworth NP-C     12/08/2023, 3:01 PM [facility] [address] Office [phone] Fax [phone]

## 2023-12-08 NOTE — Telephone Encounter (Signed)
 Patient on Xarelto for DVT/PE and lupus anticoagulant. This is managed by PCP. Will defer hold to PCP.

## 2023-12-08 NOTE — Progress Notes (Signed)
 Diagnosis: Iron  Deficiency Anemia  Provider:  Praveen Mannam MD  Procedure: IV Infusion  IV Type: Peripheral, IV Location: L Antecubital  Feraheme (Ferumoxytol), Dose: 510 mg  Infusion Start Time: 1357  Infusion Stop Time: 1544  Post Infusion IV Care: Observation period completed and Peripheral IV Discontinued  Discharge: Condition: Good, Destination: Home . AVS Declined  Performed by:  Lauran Pollard, LPN

## 2023-12-10 ENCOUNTER — Other Ambulatory Visit: Payer: Self-pay | Admitting: Pulmonary Disease

## 2023-12-10 ENCOUNTER — Encounter: Payer: Self-pay | Admitting: Pulmonary Disease

## 2023-12-10 MED ORDER — AZELASTINE HCL 0.1 % NA SOLN
2.0000 | Freq: Two times a day (BID) | NASAL | 5 refills | Status: DC
Start: 2023-12-10 — End: 2024-02-26

## 2023-12-10 MED ORDER — FLUTICASONE PROPIONATE 50 MCG/ACT NA SUSP: 2.0000 | Freq: Every day | NASAL | 5 refills | Status: DC

## 2023-12-11 ENCOUNTER — Encounter: Payer: Self-pay | Admitting: Pulmonary Disease

## 2023-12-11 ENCOUNTER — Encounter: Payer: Self-pay | Admitting: Internal Medicine

## 2023-12-11 LAB — HEPATITIS B SURFACE ANTIGEN: Hepatitis B Surface Ag: NONREACTIVE

## 2023-12-11 LAB — HEPATITIS C ANTIBODY: Hepatitis C Ab: NONREACTIVE

## 2023-12-11 LAB — IGG: IgG (Immunoglobin G), Serum: 1006 mg/dL (ref 600–1540)

## 2023-12-11 LAB — HEPATITIS B CORE ANTIBODY, TOTAL: Hep B Core Total Ab: NONREACTIVE

## 2023-12-11 LAB — ANTI-SMOOTH MUSCLE ANTIBODY, IGG: Actin (Smooth Muscle) Antibody (IGG): <20 U (ref <20)

## 2023-12-11 LAB — MITOCHONDRIAL ANTIBODIES: Mitochondrial M2 Ab, IgG: <=20 U (ref <=20.0)

## 2023-12-11 LAB — HEPATITIS B SURFACE ANTIBODY,QUALITATIVE: Hep B S Ab: NONREACTIVE

## 2023-12-11 LAB — HEPATITIS A ANTIBODY, TOTAL: Hepatitis A AB,Total: NONREACTIVE

## 2023-12-11 LAB — IGA: Immunoglobulin A: 100 mg/dL (ref 70–320)

## 2023-12-11 LAB — ALPHA-1-ANTITRYPSIN: A-1 Antitrypsin, Ser: 139 mg/dL (ref 83–199)

## 2023-12-11 LAB — TISSUE TRANSGLUTAMINASE, IGA: (tTG) Ab, IgA: <1 U/mL

## 2023-12-11 LAB — ANA: Anti Nuclear Antibody (ANA): NEGATIVE

## 2023-12-12 ENCOUNTER — Ambulatory Visit (HOSPITAL_COMMUNITY)
Admission: RE | Admit: 2023-12-12 | Discharge: 2023-12-12 | Disposition: A | Discharge status: Home / Self Care | Source: Ambulatory Visit | Attending: Internal Medicine | Admitting: Internal Medicine

## 2023-12-12 DIAGNOSIS — R162 Hepatomegaly with splenomegaly, not elsewhere classified: Secondary | ICD-10-CM | POA: Insufficient documentation

## 2023-12-12 DIAGNOSIS — K759 Inflammatory liver disease, unspecified: Secondary | ICD-10-CM | POA: Diagnosis not present

## 2023-12-12 DIAGNOSIS — R932 Abnormal findings on diagnostic imaging of liver and biliary tract: Secondary | ICD-10-CM | POA: Diagnosis not present

## 2023-12-12 DIAGNOSIS — K760 Fatty (change of) liver, not elsewhere classified: Secondary | ICD-10-CM | POA: Diagnosis not present

## 2023-12-12 DIAGNOSIS — Z944 Liver transplant status: Secondary | ICD-10-CM | POA: Diagnosis not present

## 2023-12-13 ENCOUNTER — Encounter: Payer: Self-pay | Admitting: Internal Medicine

## 2023-12-14 NOTE — Telephone Encounter (Signed)
**Note De-identified  [place] Obfuscation** Please advise 

## 2023-12-15 ENCOUNTER — Encounter (INDEPENDENT_AMBULATORY_CARE_PROVIDER_SITE_OTHER): Payer: Self-pay | Admitting: Otolaryngology

## 2023-12-18 NOTE — Telephone Encounter (Signed)
 PCP office unable to locate the clearance letter. They request I fax them a letter to [phone]. This has been done and we will await their response.

## 2023-12-20 NOTE — Telephone Encounter (Signed)
 I received a fax back from the PCP-Dr Imelda Man with approval to hold the Xarelto 2 days prior to his procedure. I spoke with [name] and he verbalized understanding. Letter sent to be scanned into epic.

## 2023-12-27 ENCOUNTER — Telehealth: Payer: Self-pay | Admitting: Internal Medicine

## 2023-12-27 ENCOUNTER — Encounter: Payer: Self-pay | Admitting: Internal Medicine

## 2023-12-27 NOTE — Telephone Encounter (Signed)
 PT wishes to speak to someone regarding the coding for his procedure 5/22. I explained that coding can't be changed. He says that its completely wrong and needs it changed so that he doesn't have to pay for it. Please advise.

## 2023-12-29 ENCOUNTER — Encounter (INDEPENDENT_AMBULATORY_CARE_PROVIDER_SITE_OTHER): Payer: Self-pay

## 2024-01-04 ENCOUNTER — Encounter: Payer: Self-pay | Admitting: Internal Medicine

## 2024-01-04 ENCOUNTER — Ambulatory Visit (AMBULATORY_SURGERY_CENTER): Admitting: Internal Medicine

## 2024-01-04 VITALS — BP 142/84 | HR 74 | Temp 97.6°F | Resp 16 | Ht 71.0 in | Wt 239.0 lb

## 2024-01-04 DIAGNOSIS — D508 Other iron deficiency anemias: Secondary | ICD-10-CM | POA: Diagnosis not present

## 2024-01-04 DIAGNOSIS — K317 Polyp of stomach and duodenum: Secondary | ICD-10-CM

## 2024-01-04 DIAGNOSIS — I890 Lymphedema, not elsewhere classified: Secondary | ICD-10-CM | POA: Diagnosis not present

## 2024-01-04 DIAGNOSIS — D123 Benign neoplasm of transverse colon: Secondary | ICD-10-CM

## 2024-01-04 DIAGNOSIS — K2970 Gastritis, unspecified, without bleeding: Secondary | ICD-10-CM | POA: Diagnosis not present

## 2024-01-04 DIAGNOSIS — K5730 Diverticulosis of large intestine without perforation or abscess without bleeding: Secondary | ICD-10-CM | POA: Diagnosis not present

## 2024-01-04 DIAGNOSIS — K3189 Other diseases of stomach and duodenum: Secondary | ICD-10-CM | POA: Diagnosis not present

## 2024-01-04 DIAGNOSIS — K648 Other hemorrhoids: Secondary | ICD-10-CM | POA: Diagnosis not present

## 2024-01-04 DIAGNOSIS — D509 Iron deficiency anemia, unspecified: Secondary | ICD-10-CM | POA: Diagnosis not present

## 2024-01-04 MED ORDER — SODIUM CHLORIDE 0.9 % IV SOLN: 500.0000 mL | Freq: Once | INTRAVENOUS | Status: DC

## 2024-01-04 NOTE — Progress Notes (Signed)
 Called to room to assist during endoscopic procedure.  Patient ID and intended procedure confirmed with present staff. Received instructions for my participation in the procedure from the performing physician.

## 2024-01-04 NOTE — Patient Instructions (Addendum)
 Upper exam: This showed inflammation in the stomach, called gastritis.  Biopsies taken.  There were also multiple innocent looking stomach polyps that I sampled with biopsies.  I do not see any signs of ulcers or cancer.  There was a tiny nodule in the upper intestine, the duodenum, that I removed.  This could be a small polyp.  Colonoscopy: Tiny colon polyp seen and removed, looks benign.  Also have diverticulosis and hemorrhoids as we knew.  You could be leaking blood from the stomach particularly since you are on Xarelto.  Let me get all of the biopsy results and I will regroup with you.  As we discussed, the best thing you can do for your overall health including your liver is to figure out a way to lose weight.  Restart Xarelto tomorrow.  I appreciate the opportunity to care for you. Kenney Peacemaker, MD, FACG  YOU HAD AN ENDOSCOPIC PROCEDURE TODAY AT THE [facility]:   Refer to the procedure report that was given to you for any specific questions about what was found during the examination.  If the procedure report does not answer your questions, please call your gastroenterologist to clarify.  If you requested that your care partner not be given the details of your procedure findings, then the procedure report has been included in a sealed envelope for you to review at your convenience later.  YOU SHOULD EXPECT: Some feelings of bloating in the abdomen. Passage of more gas than usual.  Walking can help get rid of the air that was put into your GI tract during the procedure and reduce the bloating. If you had a lower endoscopy (such as a colonoscopy or flexible sigmoidoscopy) you may notice spotting of blood in your stool or on the toilet paper. If you underwent a bowel prep for your procedure, you may not have a normal bowel movement for a few days.  Please Note:  You might notice some irritation and congestion in your nose or some drainage.  This is from the oxygen used during  your procedure.  There is no need for concern and it should clear up in a day or so.  SYMPTOMS TO REPORT IMMEDIATELY:  Following lower endoscopy (colonoscopy or flexible sigmoidoscopy):  Excessive amounts of blood in the stool  Significant tenderness or worsening of abdominal pains  Swelling of the abdomen that is new, acute  Fever of 100F or higher  Following upper endoscopy (EGD)  Vomiting of blood or coffee ground material  New chest pain or pain under the shoulder blades  Painful or persistently difficult swallowing  New shortness of breath  Fever of 100F or higher  Black, tarry-looking stools  For urgent or emergent issues, a gastroenterologist can be reached at any hour by calling [phone]. Do not use MyChart messaging for urgent concerns.    DIET:  We do recommend a small meal at first, but then you may proceed to your regular diet.  Drink plenty of fluids but you should avoid alcoholic beverages for 24 hours.  ACTIVITY:  You should plan to take it easy for the rest of today and you should NOT DRIVE or use heavy machinery until tomorrow (because of the sedation medicines used during the test).    FOLLOW UP: Our staff will call the number listed on your records the next business day following your procedure.  We will call around 7:15- 8:00 am to check on you and address any questions or concerns that you  may have regarding the information given to you following your procedure. If we do not reach you, we will leave a message.     If any biopsies were taken you will be contacted by phone or by letter within the next 1-3 weeks.  Please call us  at [phone] if you have not heard about the biopsies in 3 weeks.    SIGNATURES/CONFIDENTIALITY: You and/or your care partner have signed paperwork which will be entered into your electronic medical record.  These signatures attest to the fact that that the information above on your After Visit Summary has been reviewed and is  understood.  Full responsibility of the confidentiality of this discharge information lies with you and/or your care-partner.

## 2024-01-04 NOTE — Op Note (Signed)
 [facility] Patient Name: [name] Procedure Date: 01/04/2024 1:14 PM MRN: [id number] Endoscopist: Kenney Peacemaker , MD, [id number] Age: 78 Referring MD:  Date of Birth: February 07, 1946 Gender: Male Account #: [id number] Procedure:                Colonoscopy Indications:              Iron  deficiency anemia Medicines:                Monitored Anesthesia Care Procedure:                Pre-Anesthesia Assessment:                           - Prior to the procedure, a History and Physical                            was performed, and patient medications and                            allergies were reviewed. The patient's tolerance of                            previous anesthesia was also reviewed. The risks                            and benefits of the procedure and the sedation                            options and risks were discussed with the patient.                            All questions were answered, and informed consent                            was obtained. Prior Anticoagulants: The patient                            last took Xarelto (rivaroxaban) 2 days prior to the                            procedure. ASA Grade Assessment: III - A patient                            with severe systemic disease. After reviewing the                            risks and benefits, the patient was deemed in                            satisfactory condition to undergo the procedure.                           After obtaining informed consent, the colonoscope  was passed under direct vision. Throughout the                            procedure, the patient's blood pressure, pulse, and                            oxygen saturations were monitored continuously. The                            CF [id number] #[id number] was introduced through the anus                            and advanced to the the cecum, identified by                            appendiceal orifice and  ileocecal valve. The                            colonoscopy was performed without difficulty. The                            patient tolerated the procedure well. The quality                            of the bowel preparation was good. The ileocecal                            valve, appendiceal orifice, and rectum were                            photographed. Scope In: 1:56:39 PM Scope Out: 2:13:31 PM Scope Withdrawal Time: 0 hours 10 minutes 34 seconds  Total Procedure Duration: 0 hours 16 minutes 52 seconds  Findings:                 The digital exam findings include surgically absent                            prostate.                           A diminutive polyp was found in the transverse                            colon. The polyp was sessile. The polyp was removed                            with a cold snare. Resection and retrieval were                            complete. Verification of patient identification                            for the specimen was done. Estimated blood loss was  minimal.                           Multiple diverticula were found in the sigmoid                            colon and descending colon.                           Internal hemorrhoids were found.                           The exam was otherwise without abnormality on                            direct and retroflexion views. Complications:            No immediate complications. Estimated Blood Loss:     Estimated blood loss was minimal. Impression:               - A surgically absent prostate found on digital                            exam.                           - One diminutive polyp in the transverse colon,                            removed with a cold snare. Resected and retrieved.                           - Diverticulosis in the sigmoid colon and in the                            descending colon.                           - Internal hemorrhoids.                            - The examination was otherwise normal on direct                            and retroflexion views.                           No cause of iron -deficiency anemia found here. See                            EGD report. Recommendation:           - Patient has a contact number available for                            emergencies. The signs and symptoms of potential  delayed complications were discussed with the                            patient. Return to normal activities tomorrow.                            Written discharge instructions were provided to the                            patient.                           - Resume previous diet.                           - Continue present medications.                           - Resume Xarelto (rivaroxaban) at prior dose                            tomorrow.                           - Await pathology results.                           - No repeat colonoscopy due to age. Kenney Peacemaker, MD 01/04/2024 2:31:04 PM This report has been signed electronically.

## 2024-01-04 NOTE — Op Note (Signed)
 [facility] Patient Name: [name] Procedure Date: 01/04/2024 1:14 PM MRN: [id number] Endoscopist: Kenney Peacemaker , MD, [id number] Age: 78 Referring MD:  Date of Birth: 12/04/1945 Gender: Male Account #: [id number] Procedure:                Upper GI endoscopy Indications:              Iron  deficiency anemia Medicines:                Monitored Anesthesia Care Procedure:                Pre-Anesthesia Assessment:                           - Prior to the procedure, a History and Physical                            was performed, and patient medications and                            allergies were reviewed. The patient's tolerance of                            previous anesthesia was also reviewed. The risks                            and benefits of the procedure and the sedation                            options and risks were discussed with the patient.                            All questions were answered, and informed consent                            was obtained. Prior Anticoagulants: The patient                            last took Xarelto (rivaroxaban) 2 days prior to the                            procedure. ASA Grade Assessment: III - A patient                            with severe systemic disease. After reviewing the                            risks and benefits, the patient was deemed in                            satisfactory condition to undergo the procedure.                           After obtaining informed consent, the endoscope was  passed under direct vision. Throughout the                            procedure, the patient's blood pressure, pulse, and                            oxygen saturations were monitored continuously. The                            GIF HQ190 #[id number] was introduced through the                            mouth, and advanced to the second part of duodenum.                            The upper GI  endoscopy was accomplished without                            difficulty. The patient tolerated the procedure                            well. Scope In: Scope Out: Findings:                 Diffuse moderate inflammation characterized by                            congestion (edema), erythema, friability and                            granularity was found in the entire examined                            stomach. Biopsies were taken with a cold forceps                            for histology. Verification of patient                            identification for the specimen was done. Estimated                            blood loss was minimal.                           Multiple 1 to 7 mm semi-sessile and sessile polyps                            were found in the gastric fundus and in the gastric                            body. Biopsies were taken with a cold forceps for                            histology. Verification  of patient identification                            for the specimen was done. Estimated blood loss was                            minimal.                           A single 4 mm mucosal nodule was found in the                            second portion of the duodenum. The nodule was                            Paris classification Is (protruding, sessile).                            Biopsies were taken with a cold forceps for                            histology. Verification of patient identification                            for the specimen was done. Estimated blood loss was                            minimal.                           The exam was otherwise without abnormality. Normal                            esophagus.                           The cardia and gastric fundus were otherwise normal                            on retroflexion. Complications:            No immediate complications. Estimated Blood Loss:     Estimated blood loss was  minimal. Impression:               - Gastritis. Biopsied. It is possible he is leaking                            blood from this. Especially since he is on Xarelto.                           - Multiple gastric polyps. Biopsied. These look                            like innocent fundic gland polyps. One of them was  slightly inflamed and could have had some stigmata                            of bleeding but the remainder did not look                            hemorrhagic or a source of bleeding. Largest was 7                            mm.                           - Mucosal nodule question small polyp found in the                            duodenum. Biopsied. Removed completely with forceps.                           - The examination was otherwise normal. Recommendation:           - Patient has a contact number available for                            emergencies. The signs and symptoms of potential                            delayed complications were discussed with the                            patient. Return to normal activities tomorrow.                            Written discharge instructions were provided to the                            patient.                           - Resume previous diet.                           - Continue present medications.                           - See the other procedure note for documentation of                            additional recommendations.                           - Await pathology results. Kenney Peacemaker, MD 01/04/2024 2:27:04 PM This report has been signed electronically.

## 2024-01-04 NOTE — Progress Notes (Signed)
 To pacu, VSS. Report to [email or web address]

## 2024-01-04 NOTE — Progress Notes (Signed)
 History and Physical Interval Note:  01/04/2024 1:23 PM  [name]  has presented today for endoscopic procedure(s), with the diagnosis of  Encounter Diagnoses  Name Primary?   Metabolic dysfunction-associated fatty liver disease (MAFLD) Yes   Other iron  deficiency anemia   .  The various methods of evaluation and treatment have been discussed with the patient and/or family. After consideration of risks, benefits and other options for treatment, the patient has consented to  the endoscopic procedure(s).   The patient's history has been reviewed, patient examined, no change in status, stable for endoscopic procedure(s).  I have reviewed the patient's chart and labs.  Questions were answered to the patient's satisfaction.     Kenney Peacemaker, MD, [facility]

## 2024-01-04 NOTE — Progress Notes (Signed)
 Pt's states no medical or surgical changes since previsit or office visit.

## 2024-01-05 ENCOUNTER — Telehealth: Payer: Self-pay

## 2024-01-05 NOTE — Telephone Encounter (Signed)
  Follow up Call-     01/04/2024    1:08 PM  Call back number  Post procedure Call Back phone  # [phone]  Permission to leave phone message Yes     Patient questions:  Do you have a fever, pain , or abdominal swelling? No. Pain Score  0 *  Have you tolerated food without any problems? Yes.    Have you been able to return to your normal activities? Yes.    Do you have any questions about your discharge instructions: Diet   No. Medications  No. Follow up visit  No.  Do you have questions or concerns about your Care? No.  Actions: * If pain score is 4 or above: No action needed, pain <4.

## 2024-01-10 LAB — SURGICAL PATHOLOGY

## 2024-01-12 ENCOUNTER — Ambulatory Visit: Payer: Self-pay | Admitting: Internal Medicine

## 2024-01-23 DIAGNOSIS — I10 Essential (primary) hypertension: Secondary | ICD-10-CM | POA: Diagnosis not present

## 2024-01-23 DIAGNOSIS — G2581 Restless legs syndrome: Secondary | ICD-10-CM | POA: Diagnosis not present

## 2024-01-23 DIAGNOSIS — Z8551 Personal history of malignant neoplasm of bladder: Secondary | ICD-10-CM | POA: Diagnosis not present

## 2024-01-23 DIAGNOSIS — Z0000 Encounter for general adult medical examination without abnormal findings: Secondary | ICD-10-CM | POA: Diagnosis not present

## 2024-01-23 DIAGNOSIS — R161 Splenomegaly, not elsewhere classified: Secondary | ICD-10-CM | POA: Diagnosis not present

## 2024-01-23 DIAGNOSIS — I2510 Atherosclerotic heart disease of native coronary artery without angina pectoris: Secondary | ICD-10-CM | POA: Diagnosis not present

## 2024-01-23 DIAGNOSIS — I7121 Aneurysm of the ascending aorta, without rupture: Secondary | ICD-10-CM | POA: Diagnosis not present

## 2024-02-06 DIAGNOSIS — H16223 Keratoconjunctivitis sicca, not specified as Sjogren's, bilateral: Secondary | ICD-10-CM | POA: Diagnosis not present

## 2024-02-06 DIAGNOSIS — H2513 Age-related nuclear cataract, bilateral: Secondary | ICD-10-CM | POA: Diagnosis not present

## 2024-02-06 DIAGNOSIS — H25013 Cortical age-related cataract, bilateral: Secondary | ICD-10-CM | POA: Diagnosis not present

## 2024-02-06 DIAGNOSIS — H53031 Strabismic amblyopia, right eye: Secondary | ICD-10-CM | POA: Diagnosis not present

## 2024-02-07 DIAGNOSIS — D485 Neoplasm of uncertain behavior of skin: Secondary | ICD-10-CM | POA: Diagnosis not present

## 2024-02-07 DIAGNOSIS — Z1283 Encounter for screening for malignant neoplasm of skin: Secondary | ICD-10-CM | POA: Diagnosis not present

## 2024-02-07 DIAGNOSIS — D225 Melanocytic nevi of trunk: Secondary | ICD-10-CM | POA: Diagnosis not present

## 2024-02-21 DIAGNOSIS — K760 Fatty (change of) liver, not elsewhere classified: Secondary | ICD-10-CM | POA: Diagnosis not present

## 2024-02-26 ENCOUNTER — Encounter: Payer: Self-pay | Admitting: Pulmonary Disease

## 2024-02-26 ENCOUNTER — Ambulatory Visit: Admitting: Pulmonary Disease

## 2024-02-26 VITALS — BP 157/83 | HR 75 | Ht 71.0 in | Wt 240.2 lb

## 2024-02-26 DIAGNOSIS — R053 Chronic cough: Secondary | ICD-10-CM

## 2024-02-26 DIAGNOSIS — F17290 Nicotine dependence, other tobacco product, uncomplicated: Secondary | ICD-10-CM | POA: Diagnosis not present

## 2024-02-26 DIAGNOSIS — J328 Other chronic sinusitis: Secondary | ICD-10-CM | POA: Diagnosis not present

## 2024-02-26 DIAGNOSIS — G4733 Obstructive sleep apnea (adult) (pediatric): Secondary | ICD-10-CM

## 2024-02-26 NOTE — Progress Notes (Signed)
 [name]    [id number]    05/27/1946  Primary Care Physician:[name], Ryan, MD  Referring Physician: Clarice Ryan, MD [address] [place],  [place] [address]  Chief complaint:   In for follow-up Nasal stuffiness and congestion Shortness of breath, chronic cough  HPI:  Cough is much better  I was able to discontinue escalate his nasal spray use Now on Afrin 1 spray in each nostril and Flonase  1 spray in each nostril  Has not been using Astelin   Continues to use CPAP on a nightly basis and tolerating it well  He does suffer from allergies  There was concern about heart failure but his BNP was normal, echo from September 2024 did show some diastolic dysfunction  Comorbidities include restless legs, past history of prostate cancer, history of antiphospholipid syndrome, bladder cancer, fatty liver  Diagnosed with obstructive sleep apnea since 2008, CPAP managed by his primary care doctor  Usually goes to bed by 11, falls asleep in good order about 2 awakenings Final wake up time about 10 AM  Doing well with his CPAP with no significant concerns  Outpatient Encounter Medications as of 02/26/2024  Medication Sig   ALPRAZolam (XANAX) 1 MG tablet Take 1 tablet by mouth 2 (two) times daily.   anastrozole (ARIMIDEX) 1 MG tablet    co-enzyme Q-10 30 MG capsule Take 30 mg by mouth every evening.   Cyanocobalamin 1000 MCG/ML KIT Inject 1 mL as directed once a week.   fluticasone  (FLONASE ) 50 MCG/ACT nasal spray USE 2 SPRAYS IN EACH NOSTRIL EVERY NIGHT   Glutathione 200 MG/ML SOLN Inject 1 mL into the muscle once a week.   hydrochlorothiazide (HYDRODIURIL) 25 MG tablet Take 25 mg by mouth daily.   hydrocortisone  2.5 % cream Apply 1 application topically 2 (two) times daily as needed.   Hypertonic Nasal Wash (SINUS RINSE BOTTLE KIT) PACK Place into the nose at bedtime.   Lidocaine -Hydrocortisone  Ace 3-2.5 % KIT Place 1 application rectally daily as  needed (hemorrhoids).   metoprolol  tartrate (LOPRESSOR ) 100 MG tablet TAKE 1 TABLET (100 MG TOTAL) BY MOUTH 2 (TWO) TIMES DAILY. NEED OV.   Multiple Vitamin (MULTIVITAMIN) tablet Take 1 tablet by mouth daily.   oxymetazoline (AFRIN) 0.05 % nasal spray Place 2 sprays into the nose 2 (two) times daily.   pantoprazole  (PROTONIX ) 40 MG tablet TAKE 1 TABLET BY MOUTH EVERY DAY   ramipril  (ALTACE ) 10 MG capsule Take 1 capsule (10 mg total) by mouth 2 (two) times daily.   rivaroxaban (XARELTO) 20 MG TABS tablet Take 20 mg by mouth daily.   rosuvastatin  (CRESTOR ) 20 MG tablet Take 10 mg by mouth daily.   sertraline (ZOLOFT) 100 MG tablet Take 100 mg by mouth daily.   sildenafil (VIAGRA) 100 MG tablet Take 100 mg by mouth as needed.   testosterone enanthate (DELATESTRYL) 200 MG/ML injection Inject into the muscle once a week. For IM use only   traMADol (ULTRAM) 50 MG tablet Take 1-2 tablets by mouth as needed.   VITAMIN D, ERGOCALCIFEROL, PO Take 5,000 Units by mouth daily.   Wheat Dextrin (BENEFIBER PO) Take 1 Dose by mouth 2 (two) times daily.   [DISCONTINUED] azelastine  (ASTELIN ) 0.1 % nasal spray Place 2 sprays into both nostrils 2 (two) times daily. Use in each nostril as directed   [DISCONTINUED] ciclopirox (PENLAC) 8 % solution 1 Application.   [DISCONTINUED] fluticasone  (FLONASE ) 50 MCG/ACT nasal spray Place 2 sprays  into both nostrils daily.   [DISCONTINUED] ipratropium (ATROVENT) 0.06 % nasal spray Place 2 sprays into both nostrils 3 (three) times daily.   No facility-administered encounter medications on file as of 02/26/2024.    Allergies as of 02/26/2024 - Review Complete 02/26/2024  Allergen Reaction Noted   Aspirin Other (See Comments) 07/15/2015   Penicillins Itching 07/15/2015    Past Medical History:  Diagnosis Date   Antiphospholipid antibody syndrome (HCC) 05/22/2012   Found subsequent to recurrent PE 9/13   Arthritis    Bladder cancer (HCC) dx'd 2009   surg only   Chronic  anal fissure    w/ recurrence's   Chronic anxiety    Depression    ED (erectile dysfunction)    Esophageal candidiasis (HCC) 03/14/2017   Fatty liver    GERD (gastroesophageal reflux disease)    Heart murmur    Hepatic steatosis    Hiatal hernia    History of adenomatous polyp of colon    2005   History of bladder cancer urologist-  dr alline   papillary TCC  s/p  turbt   History of DVT (deep vein thrombosis)    post prostate surgery 2010  right LLE   History of esophageal stricture    w/ dilation   History of gastric polyp    benign 2010   History of prostate cancer urologist- dr grapey/  oncology-  dr amadeo   Stage  T1c , Gleason 3+3;   s/p  prostatectomy 03-08-210//  per pt currently PSA nondetectable    History of pulmonary embolus (PE)    post prostate surgery 2010  and 2013 x2 right side per CT scan (found to have lupus anticoaglated )   History of TIA (transient ischemic attack)    07/ 2012 noted on scans remote probable tia's in  age 78's   History of TMJ syndrome    HTN (hypertension)    Hyperlipidemia    IDA (iron  deficiency anemia)    getting iron  IV   Lupus anticoagulant positive    found 2013 when pt had PE   Moderate aortic regurgitation    with no  stenosis    OSA on CPAP    per study severe osa 12-14-2010   Prolapsed internal hemorrhoids, grade 2 03/31/2014   prostate ca dx'd 2010   surg only   RLS (restless legs syndrome)    Sleep apnea    Symptomatic PVCs cardiologist-  dr berry   intermittant   Wears glasses     Past Surgical History:  Procedure Laterality Date   APPENDECTOMY  age 78   and Left Inguinal Hernia Repair   CARDIOVASCULAR STRESS TEST  06-30-2011   dr berry   normal nuclear study/  normal LV function and wall motion, ef 56%   COLONOSCOPY  last one 10-19-2011   CYSTO/ BLADDER BX/  RETROGRADE PYELOGRAM/  TRANSRECTAL PROSTATE BX'S  07-30-2008   ESOPHAGOGASTRODUODENOSCOPY  last one 06-27-2011   EVALUATION UNDER ANESTHESIA WITH  FISTULECTOMY N/A 01/14/2016   Procedure: EXAM UNDER ANESTHESIA WITH REPAIR OF PERIRECTAL FISTULA  (LIFT);  Surgeon: Elspeth Schultze, MD;  Location[facility];  Service: General;  Laterality: N/A;   LAPAROSCOPIC CHOLECYSTECTOMY  05-25-2007   and Excision cyst back of neck   ROBOT ASSISTED LAPAROSCOPIC RADICAL PROSTATECTOMY  10-20-2008   STRABISMUS SURGERY Bilateral age 68   TONSILLECTOMY  age 12   TRANSTHORACIC ECHOCARDIOGRAM  03-23-2015   dr berry   grade 1 diastolic dysfunction, ef 55-60%/  mild AV thickened leaflets with no stenosis,  moderate AR/  mild ascending aorta ilatation3.9cm/  trivial MR and TR   UPPER GASTROINTESTINAL ENDOSCOPY      Family History  Problem Relation Age of Onset   Hypertension Mother    Hypertension Father    Stroke Father    Colon polyps Father    Colon cancer Father        questionable   Colon polyps Brother    Crohn's disease Brother    Diabetes Maternal Grandmother    Heart disease Maternal Grandmother    Cirrhosis Other        Great Uncle   Esophageal cancer Neg Hx    Rectal cancer Neg Hx    Stomach cancer Neg Hx     Social History   Socioeconomic History   Marital status: Married    Spouse name: cathy   Number of children: 2   Years of education: Not on file   Highest education level: Not on file  Occupational History   Occupation: Acupuncturist   Occupation: [employer]    Employer: [employer]  Tobacco Use   Smoking status: Some Days    Types: Cigars   Smokeless tobacco: Never   Tobacco comments:    5 cigars /week--/  smoked cigarettes for 10 years quit 1990's  Vaping Use   Vaping status: Never Used  Substance and Sexual Activity   Alcohol use: Yes    Alcohol/week: 28.0 standard drinks of alcohol    Types: 14 Glasses of wine, 14 Cans of beer per week    Comment: 3 drinks a week   Drug use: No    Comment: not since the 60's   Sexual activity: Not on file  Other Topics Concern   Not on file  Social  History Narrative   Married to [place])   Lives with wife   Gets regular exercise.   Daily caffeine- 2 cups daily.   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, mental health      Social Drivers of Corporate investment banker Strain: Low Risk  (02/21/2024)   Received from [facility]   Overall Financial Resource Strain (CARDIA)    Difficulty of Paying Living Expenses: Not very hard  Food Insecurity: No Food Insecurity (02/21/2024)   [facility]   Hunger Vital Sign    Within the past 12 months, you worried that your food would run out before you got the money to buy more.: Never true    Within the past 12 months, the food you bought just didn't last and you didn't have money to get more.: Never true  Transportation Needs: No Transportation Needs (02/21/2024)   Received from [facility] - Transportation    Lack of Transportation (Medical): No    Lack of Transportation (Non-Medical): No  Physical Activity: Insufficiently Active (02/21/2024)   Received from [facility]   Exercise Vital Sign    On average, how many days per week do you engage in moderate to strenuous exercise (like a brisk walk)?: 4 days    On average, how many minutes do you engage in exercise at this level?: 30 min  Stress: Stress Concern Present (02/21/2024)   Received from [facility] - Occupational Stress Questionnaire  Feeling of Stress : To some extent  Social Connections: Moderately Integrated (02/21/2024)   Received from [facility]   Social Network    How would you rate your social network (family, work, friends)?: Adequate participation with social networks  Intimate Partner Violence: Not At Risk (02/21/2024)   Received from [facility]   HITS    Over the last 12 months how often did your partner physically  hurt you?: Never    Over the last 12 months how often did your partner insult you or talk down to you?: Never    Over the last 12 months how often did your partner threaten you with physical harm?: Never    Over the last 12 months how often did your partner scream or curse at you?: Never    Review of Systems  HENT:  Positive for congestion.   Respiratory:  Positive for apnea and cough.   Psychiatric/Behavioral:  Positive for sleep disturbance.     Vitals:   02/26/24 1542  BP: (!) 157/83  Pulse: 75  SpO2: 95%     Physical Exam Constitutional:      Appearance: He is obese.  HENT:     Head: Normocephalic.     Nose: Nose normal.     Mouth/Throat:     Mouth: Mucous membranes are moist.  Eyes:     General: No scleral icterus. Cardiovascular:     Rate and Rhythm: Normal rate and regular rhythm.     Heart sounds: No murmur heard.    No friction rub.  Pulmonary:     Effort: No respiratory distress.     Breath sounds: No stridor. No wheezing or rhonchi.  Musculoskeletal:     Cervical back: No rigidity or tenderness.  Neurological:     Mental Status: He is alert.  Psychiatric:        Mood and Affect: Mood normal.      Data Reviewed: CPAP compliance not available at present but historically has been compliant  Last echocardiogram 05/05/2023 with diastolic dysfunction, ejection fraction of 55 to 60%  Assessment:  Obstructive sleep apnea - Adequately treated, followed by his primary doctor  Chronic recurrent sinusitis - He feels better overall with current nasal spray regimen - Continues to use Afrin and Flonase   Chronic cough - Overall better  Plan/Recommendations: Continue CPAP on a nightly basis  Continue current nasal sprays  Follow-up in 6 months  Encouraged to call with significant concerns  Jennet Epley MD [facility] 02/26/2024, 3:51 PM  CC: Clarice Nottingham, MD

## 2024-02-26 NOTE — Patient Instructions (Signed)
 Continue using your nasal sprays  Continue using your CPAP  Follow-up in about 6 months  Call us  with significant concerns

## 2024-02-29 ENCOUNTER — Ambulatory Visit (INDEPENDENT_AMBULATORY_CARE_PROVIDER_SITE_OTHER): Admitting: Otolaryngology

## 2024-02-29 VITALS — BP 154/83 | HR 79

## 2024-02-29 DIAGNOSIS — R0981 Nasal congestion: Secondary | ICD-10-CM | POA: Diagnosis not present

## 2024-02-29 DIAGNOSIS — R0982 Postnasal drip: Secondary | ICD-10-CM

## 2024-02-29 DIAGNOSIS — K219 Gastro-esophageal reflux disease without esophagitis: Secondary | ICD-10-CM

## 2024-02-29 DIAGNOSIS — J3089 Other allergic rhinitis: Secondary | ICD-10-CM

## 2024-02-29 DIAGNOSIS — R053 Chronic cough: Secondary | ICD-10-CM

## 2024-02-29 MED ORDER — LEVOCETIRIZINE DIHYDROCHLORIDE 5 MG PO TABS: 5.0000 mg | ORAL_TABLET | Freq: Every evening | ORAL | 3 refills | Status: AC

## 2024-02-29 MED ORDER — FLUTICASONE PROPIONATE 50 MCG/ACT NA SUSP
2.0000 | Freq: Two times a day (BID) | NASAL | 6 refills | Status: DC
Start: 2024-02-29 — End: 2024-03-14

## 2024-02-29 NOTE — Progress Notes (Signed)
 ENT CONSULT:  Reason for Consult: chronic cough    HPI: Discussed the use of AI scribe software for clinical note transcription with the patient, who gave verbal consent to proceed.  History of Present Illness [name] is a 78 year old male who presents with worsening nasal congestion and postnasal drip as well as chronic cough in am.   He experiences nasal congestion and thick phlegm, particularly noticeable between his nose and throat. The phlegm is more prominent in the morning, necessitating expectoration, and tends to improve as the day progresses. These symptoms have been present for a long time but have worsened since June.  He uses a CPAP machine, which he suspects may exacerbate his symptoms. He has been using Afrin nasal spray but has reduced its use due to potential side effects. He also uses Flonase  regularly. Although he has not been formally tested for allergies, he acknowledges having them and has been experiencing allergy-like symptoms for the past six weeks.  He has a history of smoking but quit in 1992. He also reports having had significant heartburn and reflux in the past, which has now almost resolved. Occasionally, drinking causes a tickling sensation that leads to sneezing.   Records Reviewed:  Office visit with Dr Neda  12/06/23 Concerned about water in his lungs on a recent chest x-ray   Cough for about 8 months   History of obstructive sleep apnea on CPAP therapy Tolerating CPAP well with no significant problems   Suffers from nasal stuffiness congestion   Used to follow-up with ENT-his physician did retire - Will want to follow-up with ENT   Has been using Afrin for nasal stuffiness and congestion Flonase  by itself did not help in the past   He does suffer from allergies   Assessment:  Obstructive sleep apnea - Adequately treated - His primary care doctor has been following his CPAP care   Chronic recurrent sinusitis - Was following up  with ENT - Made referral for ENT evaluation Does not feel he has an active infection at present   Nasal congestion and stuffiness - Has been using Afrin - Flonase  alone by itself did not help in the past - Prescription for Dymista  was placed   Plan/Recommendations: Referral to ENT   Prescription for Dymista    Encouraged to continue using CPAP on a nightly basis   Nasal rinses may help   Will obtain a chest x-ray today      Past Medical History:  Diagnosis Date   Antiphospholipid antibody syndrome (HCC) 05/22/2012   Found subsequent to recurrent PE 9/13   Arthritis    Bladder cancer (HCC) dx'd 2009   surg only   Chronic anal fissure    w/ recurrence's   Chronic anxiety    Depression    ED (erectile dysfunction)    Esophageal candidiasis (HCC) 03/14/2017   Fatty liver    GERD (gastroesophageal reflux disease)    Heart murmur    Hepatic steatosis    Hiatal hernia    History of adenomatous polyp of colon    2005   History of bladder cancer urologist-  dr alline   papillary TCC  s/p  turbt   History of DVT (deep vein thrombosis)    post prostate surgery 2010  right LLE   History of esophageal stricture    w/ dilation   History of gastric polyp    benign 2010   History of prostate cancer urologist- dr grapey/  oncology-  dr  shadad   Stage  T1c , Gleason 3+3;   s/p  prostatectomy 03-08-210//  per pt currently PSA nondetectable    History of pulmonary embolus (PE)    post prostate surgery 2010  and 2013 x2 right side per CT scan (found to have lupus anticoaglated )   History of TIA (transient ischemic attack)    07/ 2012 noted on scans remote probable tia's in  age 83's   History of TMJ syndrome    HTN (hypertension)    Hyperlipidemia    IDA (iron  deficiency anemia)    getting iron  IV   Lupus anticoagulant positive    found 2013 when pt had PE   Moderate aortic regurgitation    with no  stenosis    OSA on CPAP    per study severe osa 12-14-2010   Prolapsed  internal hemorrhoids, grade 2 03/31/2014   prostate ca dx'd 2010   surg only   RLS (restless legs syndrome)    Sleep apnea    Symptomatic PVCs cardiologist-  dr berry   intermittant   Wears glasses     Past Surgical History:  Procedure Laterality Date   APPENDECTOMY  age 89   and Left Inguinal Hernia Repair   CARDIOVASCULAR STRESS TEST  06-30-2011   dr berry   normal nuclear study/  normal LV function and wall motion, ef 56%   COLONOSCOPY  last one 10-19-2011   CYSTO/ BLADDER BX/  RETROGRADE PYELOGRAM/  TRANSRECTAL PROSTATE BX'S  07-30-2008   ESOPHAGOGASTRODUODENOSCOPY  last one 06-27-2011   EVALUATION UNDER ANESTHESIA WITH FISTULECTOMY N/A 01/14/2016   Procedure: EXAM UNDER ANESTHESIA WITH REPAIR OF PERIRECTAL FISTULA  (LIFT);  Surgeon: Elspeth Schultze, MD;  Location: [facility];  Service: General;  Laterality: N/A;   LAPAROSCOPIC CHOLECYSTECTOMY  05-25-2007   and Excision cyst back of neck   ROBOT ASSISTED LAPAROSCOPIC RADICAL PROSTATECTOMY  10-20-2008   STRABISMUS SURGERY Bilateral age 87   TONSILLECTOMY  age 50   TRANSTHORACIC ECHOCARDIOGRAM  03-23-2015   dr berry   grade 1 diastolic dysfunction, ef 55-60%/  mild AV thickened leaflets with no stenosis,  moderate AR/  mild ascending aorta ilatation3.9cm/  trivial MR and TR   UPPER GASTROINTESTINAL ENDOSCOPY      Family History  Problem Relation Age of Onset   Hypertension Mother    Hypertension Father    Stroke Father    Colon polyps Father    Colon cancer Father        questionable   Colon polyps Brother    Crohn's disease Brother    Diabetes Maternal Grandmother    Heart disease Maternal Grandmother    Cirrhosis Other        Great Uncle   Esophageal cancer Neg Hx    Rectal cancer Neg Hx    Stomach cancer Neg Hx     Social History:  reports that he has been smoking cigars. He has never used smokeless tobacco. He reports current alcohol use of about 28.0 standard drinks of alcohol per week. He reports  that he does not use drugs.  Allergies:  Allergies  Allergen Reactions   Aspirin Other (See Comments)    REACTION: gi upset with higher dose   Penicillins Itching    Medications: I have reviewed the patient's current medications.  The PMH, PSH, Medications, Allergies, and SH were reviewed and updated.  ROS: Constitutional: Negative for fever, weight loss and weight gain. Cardiovascular: Negative for chest pain and dyspnea on  exertion. Respiratory: Is not experiencing shortness of breath at rest. Gastrointestinal: Negative for nausea and vomiting. Neurological: Negative for headaches. Psychiatric: The patient is not nervous/anxious  Blood pressure (!) 154/83, pulse 79, SpO2 93%. There is no height or weight on file to calculate BMI.  PHYSICAL EXAM:  Exam: General: Well-developed, well-nourished Communication and Voice: Clear pitch and clarity Respiratory Respiratory effort: Equal inspiration and expiration without stridor Cardiovascular Peripheral Vascular: Warm extremities with equal color/perfusion Eyes: No nystagmus with equal extraocular motion bilaterally Neuro/Psych/Balance: Patient oriented to person, place, and time; Appropriate mood and affect; Gait is intact with no imbalance; Cranial nerves I-XII are intact Head and Face Inspection: Normocephalic and atraumatic without mass or lesion Palpation: Facial skeleton intact without bony stepoffs Salivary Glands: No mass or tenderness Facial Strength: Facial motility symmetric and full bilaterally ENT Pinna: External ear intact and fully developed External canal: Canal is patent with intact skin Tympanic Membrane: Clear and mobile External Nose: No scar or anatomic deformity Internal Nose: Septum is relatively straight. No polyp, or purulence. Mucosal edema and erythema present.  Bilateral inferior turbinate hypertrophy.  Lips, Teeth, and gums: Mucosa and teeth intact and viable TMJ: No pain to palpation with full  mobility Oral cavity/oropharynx: No erythema or exudate, no lesions present Nasopharynx: No mass or lesion with intact mucosa Hypopharynx: Intact mucosa without pooling of secretions Larynx Glottic: Full true vocal cord mobility without lesion or mass Supraglottic: Normal appearing epiglottis and AE folds Interarytenoid Space: Moderate pachydermia&edema Subglottic Space: Patent without lesion or edema Neck Neck and Trachea: Midline trachea without mass or lesion Thyroid : No mass or nodularity Lymphatics: No lymphadenopathy  Procedure: Preoperative diagnosis: chronic cough   Postoperative diagnosis:   Same  Procedure: Flexible fiberoptic laryngoscopy  Surgeon: Elena Larry, MD  Anesthesia: Topical lidocaine  and Afrin Complications: None Condition is stable throughout exam  Indications and consent:  The patient presents to the clinic with above symptoms. Indirect laryngoscopy view was incomplete. Thus it was recommended that they undergo a flexible fiberoptic laryngoscopy. All of the risks, benefits, and potential complications were reviewed with the patient preoperatively and verbal informed consent was obtained.  Procedure: The patient was seated upright in the clinic. Topical lidocaine  and Afrin were applied to the nasal cavity. After adequate anesthesia had occurred, I then proceeded to pass the flexible telescope into the nasal cavity. The nasal cavity was patent without rhinorrhea or polyp. The nasopharynx was also patent without mass or lesion. The base of tongue was visualized and was normal. There were no signs of pooling of secretions in the piriform sinuses. The true vocal folds were mobile bilaterally. There were no signs of glottic or supraglottic mucosal lesion or mass. There was moderate interarytenoid pachydermia and post cricoid edema. The telescope was then slowly withdrawn and the patient tolerated the procedure throughout.    PROCEDURE NOTE: nasal  endoscopy  Preoperative diagnosis: chronic sinusitis symptoms  Postoperative diagnosis: same  Procedure: Diagnostic nasal endoscopy (68768)  Surgeon: Elena Larry, M.D.  Anesthesia: Topical lidocaine  and Afrin  H&P REVIEW: The patient's history and physical were reviewed today prior to procedure. All medications were reviewed and updated as well. Complications: None Condition is stable throughout exam Indications and consent: The patient presents with symptoms of chronic sinusitis not responding to previous therapies. All the risks, benefits, and potential complications were reviewed with the patient preoperatively and informed consent was obtained. The time out was completed with confirmation of the correct procedure.   Procedure: The patient was seated upright  in the clinic. Topical lidocaine  and Afrin were applied to the nasal cavity. After adequate anesthesia had occurred, the rigid nasal endoscope was passed into the nasal cavity. The nasal mucosa, turbinates, septum, and sinus drainage pathways were visualized bilaterally. This revealed no purulence or significant secretions that might be cultured. There were was no polyps or sites of significant inflammation. The mucosa was intact and there was no crusting present. The scope was then slowly withdrawn and the patient tolerated the procedure well. There were no complications or blood loss.   Studies Reviewed: CXR 12/06/23 Cardiac silhouette is normal in size and configuration. No mediastinal or hilar masses. No evidence of adenopathy.   Clear lungs.  No pleural effusion or pneumothorax.   Skeletal structures are intact.   IMPRESSION: No active cardiopulmonary disease.  Assessment/Plan: Encounter Diagnoses  Name Primary?   Chronic cough Yes   Chronic nasal congestion    Environmental and seasonal allergies    Post-nasal drip    Chronic GERD     Assessment and Plan Assessment & Plan Chronic cough  Coughs up phlegm in  am, and reports post-nasal drainage. Flexible scope exam without masses or lesions. He did have mild reflux changes. CXR negative a few months ago. Seeing Pulm already 2/2 OSA. On CPAP.  I discussed common etiologies of chronic cough including GERD/allergies and PND and lung disease with the patient and explained that in most cases the cause of chronic cough is multi-factorial - Recommend nasal saline rinses with Navage - Advised use of Flonase  after nasal rinse. - Prescribe Xyzal  5 mg at night. - Discussed potential allergy testing and referral if symptoms persist.  Gastroesophageal reflux disease (GERD) -  Reflux Gourmet after meals - diet and lifestyle changes to minimize GERD - Refer to [employer] blog for dietary and lifestyle modifications/reflux cook book  Chronic nasal congestion Environmental allergies and post-nasal drip - Recommend nasal saline rinses with Navage - Advised use of Flonase  after nasal rinse. - Prescribe Xyzal  5 mg at night. - Discussed potential allergy testing and referral if symptoms persist.   Thank you for allowing me to participate in the care of this patient. Please do not hesitate to contact me with any questions or concerns.   Elena Larry, MD Otolaryngology [facility] Phone: [phone] Fax: [phone]    02/29/2024, 1:27 PM

## 2024-02-29 NOTE — Patient Instructions (Signed)

## 2024-03-04 DIAGNOSIS — K760 Fatty (change of) liver, not elsewhere classified: Secondary | ICD-10-CM | POA: Diagnosis not present

## 2024-03-14 ENCOUNTER — Ambulatory Visit: Payer: Medicare Other | Admitting: Neurology

## 2024-03-14 ENCOUNTER — Encounter: Payer: Self-pay | Admitting: Neurology

## 2024-03-14 VITALS — BP 134/74 | HR 69 | Ht 71.0 in | Wt 235.5 lb

## 2024-03-14 DIAGNOSIS — R269 Unspecified abnormalities of gait and mobility: Secondary | ICD-10-CM

## 2024-03-14 DIAGNOSIS — G5623 Lesion of ulnar nerve, bilateral upper limbs: Secondary | ICD-10-CM

## 2024-03-14 DIAGNOSIS — G4733 Obstructive sleep apnea (adult) (pediatric): Secondary | ICD-10-CM

## 2024-03-14 DIAGNOSIS — R9082 White matter disease, unspecified: Secondary | ICD-10-CM | POA: Diagnosis not present

## 2024-03-14 DIAGNOSIS — G5603 Carpal tunnel syndrome, bilateral upper limbs: Secondary | ICD-10-CM | POA: Diagnosis not present

## 2024-03-14 NOTE — Progress Notes (Signed)
 [facility]  PATIENT: [name] DOB: 10/23/1945  REFERRING DOCTOR OR PCP: Vannie Hives MD SOURCE: Patient, notes from primary care, imaging and lab results  _________________________________   HISTORICAL  CHIEF COMPLAINT:  Chief Complaint  Patient presents with   RM11/Gait Disturbance    Pt is here Alone. Pt states that his gait has been okay.     HISTORY OF PRESENT ILLNESS:  [name] is a 78 y.o. man with gait ataxia, and OSA on CPAP  Update  03/14/2024: He has possible MS.   Since the last visit he feels mostly stable.   Balance is still a little off. Some stumbles but no falls.  He feels strength in the arms and legs is fine.     He gets some hand tingling but no fixed numbness.   He has some urinary frequency but no incontinence (has had prostatectomy - likely more related to this).    He notes he is sweating more, especially at night.    He has occasional bouts of nausea that last 15 minutes.   No vomiting.     The MRI of the brain 06/02/2021 shows stable T2/FLAIR hyperintense foci.  Although statistically these are more likely to represent chronic microvascular ischemic change, several of the foci are radially oriented to the ventricles and it is possible he has a very mild multiple sclerosis (his son is a patient of mine who has MS).  However, given that there was very little change over 10 years and > 43 years old  I do not think I would treat even if we were sure of the diagnosis.  Therefore, we will hold off on a lumbar puncture.  MRI of the cervical spine showed a normal spinal cord and some degenerative changes worse at C3-C4.  He has mre tingling in his hands, sometimes more noticaeable at noght  He has OSA and is using CPAP nightly with good efficacy and compliance.  He wakes up rested most mornings.    He has vivid dreams with rare  RBD like behavior (now usually just talks but had yelled out) He has no sleep paralysis.  He has mild  EDS.  He prefers not to do a PSG/MSLT.   He denies EDS  - tired some days but rarely enough to take a nap.     He sleeps well most nights.   He gets some deep sleep according to his watch.    He has several vascular risks:  Hypertension, hyperlipidemia, smoking.   No DM.   He also has coronary artery disease (high calcium  score) but has no angina.    Stress testes were fine in the past.    CT has also shown a mild thoracic aortic aneurysm (4.5) that is followed with serial scanning.      Possible MS History: In 1987, he had vertigo and was unable to stand.  His neurologist mentioned MS as a possibility.   He improved after a month.   Vertigo returned in 2000 for a month and MS was again brought up but no firm diagnosis was made.   He again improved  He gets dizzy spells.  He does not get rotational vertigo but fels like he is pushed one way or the other.  .   Severe episodes lasting 20 seconds occur about 5-6/year.   He feels weak afterwards.  He does not feel he will pass out.    In 2012, he had an MRA showing dolichoectasia  but no intracranial stenosis.     MRI in 2022 showed a couple more foci but no severe change.     EPWORTH SLEEPINESS SCALE  On a scale of 0 - 3 what is the chance of dozing:  Sitting and Reading:   1 Watching TV:    1 Sitting inactive in a public place: 0 Passenger in car for one hour: 3 Lying down to rest in the afternoon: 3 Sitting and talking to someone: 0 Sitting quietly after lunch:  0 In a car, stopped in traffic:  0  Total (out of 24):   8/24   mild excessive sleepiness.      Images reviewed: MRI of the head and MR angiogram 03/06/2011 showed T2/flair hyperintense foci, many in the periventricular white matter, some radially oriented to the ventricles.  There was mild age-appropriate generalized cortical atrophy.  Dolichoectasia of the vertebrobasilar and carotid arteries near the skull base were noted.  The MRI of the brain 06/02/2021 shows T2/FLAIR  hyperintense foci mildly progressed compared to the 2012 MRI.  Some of the foci are radially oriented to the ventricles the most are nonspecific.  MRI of the cervical spine 06/02/2021 showed a normal spinal cord and some degenerative changes worse at C3-C4.   REVIEW OF SYSTEMS: Constitutional: No fevers, chills, sweats, or change in appetite Eyes: No visual changes, double vision, eye pain Ear, nose and throat: No hearing loss, ear pain, nasal congestion, sore throat Cardiovascular: No chest pain, palpitations Respiratory:  No shortness of breath at rest or with exertion.   No wheezes GastrointestinaI: No nausea, vomiting, diarrhea, abdominal pain, fecal incontinence Genitourinary:  No dysuria, urinary retention or frequency.  No nocturia. Musculoskeletal:  No neck pain, back pain Integumentary: No rash, pruritus, skin lesions Neurological: as above Psychiatric: No depression at this time.  No anxiety Endocrine: No palpitations, diaphoresis, change in appetite, change in weigh or increased thirst Hematologic/Lymphatic:  No anemia, purpura, petechiae. Allergic/Immunologic: No itchy/runny eyes, nasal congestion, recent allergic reactions, rashes  ALLERGIES: Allergies  Allergen Reactions   Aspirin Other (See Comments)    REACTION: gi upset with higher dose   Penicillins Itching    HOME MEDICATIONS:  Current Outpatient Medications:    ALPRAZolam (XANAX) 1 MG tablet, Take 1 tablet by mouth 2 (two) times daily., Disp: , Rfl:    anastrozole (ARIMIDEX) 1 MG tablet, , Disp: , Rfl:    co-enzyme Q-10 30 MG capsule, Take 30 mg by mouth every evening., Disp: , Rfl:    Cyanocobalamin 1000 MCG/ML KIT, Inject 1 mL as directed once a week., Disp: , Rfl:    fluticasone  (FLONASE ) 50 MCG/ACT nasal spray, USE 2 SPRAYS IN EACH NOSTRIL EVERY NIGHT, Disp: 48 mL, Rfl: 6   Glutathione 200 MG/ML SOLN, Inject 1 mL into the muscle once a week., Disp: , Rfl:    hydrochlorothiazide (HYDRODIURIL) 25 MG tablet,  Take 25 mg by mouth daily., Disp: , Rfl:    hydrocortisone  2.5 % cream, Apply 1 application topically 2 (two) times daily as needed., Disp: , Rfl:    Hypertonic Nasal Wash (SINUS RINSE BOTTLE KIT) PACK, Place into the nose at bedtime., Disp: , Rfl:    levocetirizine (XYZAL  ALLERGY 24HR) 5 MG tablet, Take 1 tablet (5 mg total) by mouth every evening., Disp: 30 tablet, Rfl: 3   Lidocaine -Hydrocortisone  Ace 3-2.5 % KIT, Place 1 application rectally daily as needed (hemorrhoids)., Disp: 1 kit, Rfl: 3   metoprolol  tartrate (LOPRESSOR ) 100 MG tablet, TAKE 1 TABLET (  100 MG TOTAL) BY MOUTH 2 (TWO) TIMES DAILY. NEED OV., Disp: 180 tablet, Rfl: 2   Multiple Vitamin (MULTIVITAMIN) tablet, Take 1 tablet by mouth daily., Disp: , Rfl:    oxymetazoline (AFRIN) 0.05 % nasal spray, Place 2 sprays into the nose 2 (two) times daily., Disp: , Rfl:    ramipril  (ALTACE ) 10 MG capsule, Take 1 capsule (10 mg total) by mouth 2 (two) times daily., Disp: 180 capsule, Rfl: 3   rivaroxaban (XARELTO) 20 MG TABS tablet, Take 20 mg by mouth daily., Disp: , Rfl:    rosuvastatin  (CRESTOR ) 20 MG tablet, Take 10 mg by mouth daily., Disp: , Rfl:    sertraline (ZOLOFT) 100 MG tablet, Take 100 mg by mouth daily., Disp: , Rfl:    sildenafil (VIAGRA) 100 MG tablet, Take 100 mg by mouth as needed., Disp: , Rfl:    testosterone enanthate (DELATESTRYL) 200 MG/ML injection, Inject into the muscle once a week. For IM use only, Disp: , Rfl:    traMADol (ULTRAM) 50 MG tablet, Take 1-2 tablets by mouth as needed., Disp: , Rfl:    VITAMIN D, ERGOCALCIFEROL, PO, Take 5,000 Units by mouth daily., Disp: , Rfl:    Wheat Dextrin (BENEFIBER PO), Take 1 Dose by mouth 2 (two) times daily., Disp: , Rfl:    fluticasone  (FLONASE ) 50 MCG/ACT nasal spray, Place 2 sprays into both nostrils 2 (two) times daily., Disp: 16 g, Rfl: 6   pantoprazole  (PROTONIX ) 40 MG tablet, TAKE 1 TABLET BY MOUTH EVERY DAY, Disp: 90 tablet, Rfl: 3  PAST MEDICAL HISTORY: Past  Medical History:  Diagnosis Date   Antiphospholipid antibody syndrome (HCC) 05/22/2012   Found subsequent to recurrent PE 9/13   Arthritis    Bladder cancer (HCC) dx'd 2009   surg only   Chronic anal fissure    w/ recurrence's   Chronic anxiety    Depression    ED (erectile dysfunction)    Esophageal candidiasis (HCC) 03/14/2017   Fatty liver    GERD (gastroesophageal reflux disease)    Heart murmur    Hepatic steatosis    Hiatal hernia    History of adenomatous polyp of colon    2005   History of bladder cancer urologist-  dr alline   papillary TCC  s/p  turbt   History of DVT (deep vein thrombosis)    post prostate surgery 2010  right LLE   History of esophageal stricture    w/ dilation   History of gastric polyp    benign 2010   History of prostate cancer urologist- dr grapey/  oncology-  dr amadeo   Stage  T1c , Gleason 3+3;   s/p  prostatectomy 03-08-210//  per pt currently PSA nondetectable    History of pulmonary embolus (PE)    post prostate surgery 2010  and 2013 x2 right side per CT scan (found to have lupus anticoaglated )   History of TIA (transient ischemic attack)    07/ 2012 noted on scans remote probable tia's in  age 81's   History of TMJ syndrome    HTN (hypertension)    Hyperlipidemia    IDA (iron  deficiency anemia)    getting iron  IV   Lupus anticoagulant positive    found 2013 when pt had PE   Moderate aortic regurgitation    with no  stenosis    OSA on CPAP    per study severe osa 12-14-2010   Prolapsed internal hemorrhoids, grade 2 03/31/2014  prostate ca dx'd 2010   surg only   RLS (restless legs syndrome)    Sleep apnea    Symptomatic PVCs cardiologist-  dr berry   intermittant   Wears glasses     PAST SURGICAL HISTORY: Past Surgical History:  Procedure Laterality Date   APPENDECTOMY  age 32   and Left Inguinal Hernia Repair   CARDIOVASCULAR STRESS TEST  06-30-2011   dr berry   normal nuclear study/  normal LV function and wall  motion, ef 56%   COLONOSCOPY  last one 10-19-2011   CYSTO/ BLADDER BX/  RETROGRADE PYELOGRAM/  TRANSRECTAL PROSTATE BX'S  07-30-2008   ESOPHAGOGASTRODUODENOSCOPY  last one 06-27-2011   EVALUATION UNDER ANESTHESIA WITH FISTULECTOMY N/A 01/14/2016   Procedure: EXAM UNDER ANESTHESIA WITH REPAIR OF PERIRECTAL FISTULA  (LIFT);  Surgeon: Elspeth Schultze, MD;  Location: [facility];  Service: General;  Laterality: N/A;   LAPAROSCOPIC CHOLECYSTECTOMY  05-25-2007   and Excision cyst back of neck   ROBOT ASSISTED LAPAROSCOPIC RADICAL PROSTATECTOMY  10-20-2008   STRABISMUS SURGERY Bilateral age 78   TONSILLECTOMY  age 78   TRANSTHORACIC ECHOCARDIOGRAM  03-23-2015   dr berry   grade 1 diastolic dysfunction, ef 55-60%/  mild AV thickened leaflets with no stenosis,  moderate AR/  mild ascending aorta ilatation3.9cm/  trivial MR and TR   UPPER GASTROINTESTINAL ENDOSCOPY      FAMILY HISTORY: Family History  Problem Relation Age of Onset   Hypertension Mother    Hypertension Father    Stroke Father    Colon polyps Father    Colon cancer Father        questionable   Colon polyps Brother    Crohn's disease Brother    Diabetes Maternal Grandmother    Heart disease Maternal Grandmother    Cirrhosis Other        Great Uncle   Esophageal cancer Neg Hx    Rectal cancer Neg Hx    Stomach cancer Neg Hx     SOCIAL HISTORY:  Social History   Socioeconomic History   Marital status: Married    Spouse name: cathy   Number of children: 2   Years of education: Not on file   Highest education level: Not on file  Occupational History   Occupation: Acupuncturist   Occupation: [employer]    Employer: [employer]  Tobacco Use   Smoking status: Some Days    Types: Cigars   Smokeless tobacco: Never   Tobacco comments:    5 cigars /week--/  smoked cigarettes for 10 years quit 1990's  Vaping Use   Vaping status: Never Used  Substance and Sexual Activity   Alcohol use: Yes     Alcohol/week: 28.0 standard drinks of alcohol    Types: 14 Glasses of wine, 14 Cans of beer per week    Comment: 3 drinks a week   Drug use: No    Comment: not since the 60's   Sexual activity: Not on file  Other Topics Concern   Not on file  Social History Narrative   Married to [place])   Lives with wife   Gets regular exercise.   Daily caffeine- 2 cups daily.   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, mental health      Social Drivers of Corporate investment banker Strain: Low  Risk  (02/21/2024)   Received from [facility]   Overall Financial Resource Strain (CARDIA)    Difficulty of Paying Living Expenses: Not very hard  Food Insecurity: No Food Insecurity (02/21/2024)   Received [facility]   Hunger Vital Sign    Within the past 12 months, you worried that your food would run out before you got the money to buy more.: Never true    Within the past 12 months, the food you bought just didn't last and you didn't have money to get more.: Never true  Transportation Needs: No Transportation Needs (02/21/2024)   Received from [facility] - Transportation    Lack of Transportation (Medical): No    Lack of Transportation (Non-Medical): No  Physical Activity: Insufficiently Active (02/21/2024)   Received from [facility]   Exercise Vital Sign    On average, how many days per week do you engage in moderate to strenuous exercise (like a brisk walk)?: 4 days    On average, how many minutes do you engage in exercise at this level?: 30 min  Stress: Stress Concern Present (02/21/2024)   Received from [facility] - Occupational Stress Questionnaire    Feeling of Stress : To some extent  Social Connections: Moderately Integrated (02/21/2024)   Received from [facility]   Social Network    How would  you rate your social network (family, work, friends)?: Adequate participation with social networks  Intimate Partner Violence: Not At Risk (02/21/2024)   Received from [facility]   HITS    Over the last 12 months how often did your partner physically hurt you?: Never    Over the last 12 months how often did your partner insult you or talk down to you?: Never    Over the last 12 months how often did your partner threaten you with physical harm?: Never    Over the last 12 months how often did your partner scream or curse at you?: Never     PHYSICAL EXAM  Vitals:   03/14/24 1135  BP: 134/74  Pulse: 69  Weight: 235 lb 8 oz (106.8 kg)  Height: 5' 11 (1.803 m)       Body mass index is 32.85 kg/m.   General: The patient is well-developed and well-nourished and in no acute distress.  Pharynx is Mallampati 2  HEENT:  Head is [place]/AT.  Sclera are anicteric.    Skin: Extremities are without rash or  edema.  Musculoskeletal:  Back is nontender  Neurologic Exam  Mental status: The patient is alert and oriented x 3 at the time of the examination. The patient has apparent normal recent and remote memory, with an apparently normal attention span and concentration ability.   Speech is normal.  Cranial nerves: Extraocular movements show right exotropia (old).   ,  Color vision is mildly desaturated OS.  Facial symmetry is present.Reduced left face  sensation to soft touch.  Facial strength is normal.  Trapezius and sternocleidomastoid strength is normal. No dysarthria is noted.  Mild reduced left hearing and Weber lateralizes to the right.   Motor:  Mild ulnar innervated atrophy both hands.   .   Tone is normal. Strength is  4+ / 5 in right APB and right ulnar innervated but 5/5 elsewhere  OTHER: He has bilateral Tinel signs at elbows and wrists, Phalens sign only to the right hand.    Sensory: Sensory testing shpws  reduced touch and vib on the left arm/leg  Coordination: Cerebellar  testing reveals good finger-nose-finger and heel-to-shin bilaterally.  Gait and station: Station is normal.   Gait is mildly wide and turn is wide.  Tandem gait is wide but likely normal for age.  There is no Romberg sign.  Reflexes: Deep tendon reflexes are symmetric and normal in rms and trace at knees.       DIAGNOSTIC DATA (LABS, IMAGING, TESTING) - I reviewed patient records, labs, notes, testing and imaging myself where available.  Lab Results  Component Value Date   WBC 5.9 10/26/2023   HGB 15.4 10/26/2023   HCT 50.0 10/26/2023   MCV 77.0 (L) 10/26/2023   PLT 170 10/26/2023      Component Value Date/Time   NA 139 10/26/2023 0916   NA 142 04/18/2018 1324   NA 140 07/14/2017 0817   K 4.0 10/26/2023 0916   K 3.8 07/14/2017 0817   CL 100 10/26/2023 0916   CL 103 11/02/2012 0737   CO2 36 (H) 10/26/2023 0916   CO2 28 07/14/2017 0817   GLUCOSE 93 10/26/2023 0916   GLUCOSE 104 07/14/2017 0817   GLUCOSE 95 11/02/2012 0737   BUN 11 10/26/2023 0916   BUN 12 04/18/2018 1324   BUN 16.8 07/14/2017 0817   CREATININE 1.27 (H) 10/26/2023 0916   CREATININE 1.1 07/14/2017 0817   CALCIUM  9.4 10/26/2023 0916   CALCIUM  9.4 07/14/2017 0817   PROT 7.1 10/26/2023 0916   PROT 7.0 05/14/2021 1106   PROT 7.1 07/14/2017 0817   ALBUMIN 4.5 10/26/2023 0916   ALBUMIN 4.7 05/14/2021 1106   ALBUMIN 4.1 07/14/2017 0817   AST 45 (H) 10/26/2023 0916   AST 35 (H) 07/14/2017 0817   ALT 69 (H) 10/26/2023 0916   ALT 57 (H) 07/14/2017 0817   ALKPHOS 58 10/26/2023 0916   ALKPHOS 67 07/14/2017 0817   BILITOT 1.2 10/26/2023 0916   BILITOT 0.60 07/14/2017 0817   GFRNONAA 59 (L) 10/26/2023 0916   GFRAA >60 07/26/2019 0818   Lab Results  Component Value Date   CHOL 137 05/14/2021   HDL 38 (L) 05/14/2021   LDLCALC 76 05/14/2021   TRIG 131 05/14/2021   CHOLHDL 3.6 05/14/2021   Lab Results  Component Value Date   HGBA1C 5.7 (H) 03/05/2011   Lab Results  Component Value Date   VITAMINB12  1030 (H) 12/12/2008   Lab Results  Component Value Date   TSH 0.764 03/30/2011       ASSESSMENT AND PLAN  Ulnar neuropathy of both upper extremities  Bilateral carpal tunnel syndrome  Gait disturbance  White matter abnormality on MRI of brain  OSA on CPAP   He has possible multiple sclerosis.  Due to his age, and lack of significant changes on MRI over many years, he does not need to be on a disease modifying therapy.   Because a firm diagnosis is unlikely to change our plans, we will hold off on checking CSF.    Will recheck MRI in 2026 and reconsider if progression compared to 2022 Stay active and exercise as tolerated. Continue CPAP for OSA.  Excessive daytime sleepiness is doing better.  If this worsens again consider modafinil .   If hand numbness and tingling worsens, we will check a NCV/EMG in bilateral arms to further investigate. For likely R>L CTS.  Recommend CTS splint.     He will return to see us  in 12 months or sooner if there are new or worsening  neurologic symptoms or based on the results of the studies.  Daelynn Blower A. Vear, MD, [facility] 03/14/2024, 11:52 AM Certified in Neurology, Clinical Neurophysiology, Sleep Medicine and Neuroimaging  [facility] [address], [address] [place], [place] [address] [phone]

## 2024-04-01 ENCOUNTER — Other Ambulatory Visit: Payer: Self-pay | Admitting: Cardiovascular Disease

## 2024-04-03 DIAGNOSIS — G4733 Obstructive sleep apnea (adult) (pediatric): Secondary | ICD-10-CM | POA: Diagnosis not present

## 2024-04-05 DIAGNOSIS — X32XXXD Exposure to sunlight, subsequent encounter: Secondary | ICD-10-CM | POA: Diagnosis not present

## 2024-04-05 DIAGNOSIS — L570 Actinic keratosis: Secondary | ICD-10-CM | POA: Diagnosis not present

## 2024-04-05 DIAGNOSIS — D485 Neoplasm of uncertain behavior of skin: Secondary | ICD-10-CM | POA: Diagnosis not present

## 2024-04-05 DIAGNOSIS — L430 Hypertrophic lichen planus: Secondary | ICD-10-CM | POA: Diagnosis not present

## 2024-04-25 ENCOUNTER — Encounter: Payer: Self-pay | Admitting: Cardiovascular Disease

## 2024-04-26 ENCOUNTER — Other Ambulatory Visit: Payer: Self-pay

## 2024-04-30 ENCOUNTER — Other Ambulatory Visit: Payer: Self-pay

## 2024-04-30 NOTE — Telephone Encounter (Signed)
 Can we please send in 30 day supply asking pt to make overdue appt with 1st attempt? Please address

## 2024-05-01 ENCOUNTER — Ambulatory Visit (HOSPITAL_COMMUNITY)
Admission: RE | Admit: 2024-05-01 | Discharge: 2024-05-01 | Disposition: A | Discharge status: Home / Self Care | Source: Ambulatory Visit | Attending: Cardiovascular Disease | Admitting: Cardiovascular Disease

## 2024-05-01 DIAGNOSIS — I351 Nonrheumatic aortic (valve) insufficiency: Secondary | ICD-10-CM | POA: Insufficient documentation

## 2024-05-01 MED ORDER — RAMIPRIL 10 MG PO CAPS: 10.0000 mg | ORAL_CAPSULE | Freq: Two times a day (BID) | ORAL | 0 refills | Status: DC

## 2024-05-01 MED ORDER — METOPROLOL TARTRATE 100 MG PO TABS: 100.0000 mg | ORAL_TABLET | Freq: Two times a day (BID) | ORAL | 0 refills | Status: DC

## 2024-05-02 ENCOUNTER — Ambulatory Visit: Payer: Self-pay | Admitting: Cardiovascular Disease

## 2024-05-02 LAB — ECHOCARDIOGRAM COMPLETE
AR max vel: 2.56 cm2
AV Area VTI: 2.44 cm2
AV Area mean vel: 2.41 cm2
AV Mean grad: 4 mmHg
AV Peak grad: 7.2 mmHg
Ao pk vel: 1.34 m/s
Area-P 1/2: 2.97 cm2
S' Lateral: 3.4 cm

## 2024-05-03 MED ORDER — RAMIPRIL 10 MG PO CAPS: 10.0000 mg | ORAL_CAPSULE | Freq: Two times a day (BID) | ORAL | 0 refills | Status: DC

## 2024-05-21 ENCOUNTER — Ambulatory Visit: Attending: Cardiovascular Disease | Admitting: Cardiovascular Disease

## 2024-05-21 ENCOUNTER — Encounter: Payer: Self-pay | Admitting: Cardiovascular Disease

## 2024-05-21 VITALS — BP 144/74 | HR 70 | Ht 71.0 in | Wt 234.0 lb

## 2024-05-21 DIAGNOSIS — R931 Abnormal findings on diagnostic imaging of heart and coronary circulation: Secondary | ICD-10-CM

## 2024-05-21 DIAGNOSIS — I10 Essential (primary) hypertension: Secondary | ICD-10-CM

## 2024-05-21 DIAGNOSIS — I2699 Other pulmonary embolism without acute cor pulmonale: Secondary | ICD-10-CM | POA: Diagnosis not present

## 2024-05-21 DIAGNOSIS — I7121 Aneurysm of the ascending aorta, without rupture: Secondary | ICD-10-CM

## 2024-05-21 MED ORDER — ROSUVASTATIN CALCIUM 20 MG PO TABS: 20.0000 mg | ORAL_TABLET | Freq: Every day | ORAL | 3 refills | Status: AC

## 2024-05-21 MED ORDER — METOPROLOL TARTRATE 100 MG PO TABS: 100.0000 mg | ORAL_TABLET | Freq: Two times a day (BID) | ORAL | 4 refills | Status: AC

## 2024-05-21 MED ORDER — RAMIPRIL 10 MG PO CAPS: 10.0000 mg | ORAL_CAPSULE | Freq: Two times a day (BID) | ORAL | 4 refills | Status: AC

## 2024-05-21 NOTE — Patient Instructions (Signed)
 Medication Instructions:  Your physician has recommended you make the following change in your medication:   -Increase rosuvastatin  (crestor ) to 20mg  once daily.  *If you need a refill on your cardiac medications before your next appointment, please call your pharmacy*  Lab Work: Your physician recommends that you return for lab work in: 3 months for FASTING Lipid/liver panel  If you have labs (blood work) drawn today and your tests are completely normal, you will receive your results only by: MyChart Message (if you have MyChart) OR A paper copy in the mail If you have any lab test that is abnormal or we need to change your treatment, we will call you to review the results.  Testing/Procedures: Your physician has requested that you have an echocardiogram. Echocardiography is a painless test that uses sound waves to create images of your heart. It provides your doctor with information about the size and shape of your heart and how well your heart's chambers and valves are working. This procedure takes approximately one hour. There are no restrictions for this procedure. Please do NOT wear cologne, perfume, aftershave, or lotions (deodorant is allowed). Please arrive 15 minutes prior to your appointment time.  Please note: We ask at that you not bring children with you during ultrasound (echo/ vascular) testing. Due to room size and safety concerns, children are not allowed in the ultrasound rooms during exams. Our front office staff cannot provide observation of children in our lobby area while testing is being conducted. An adult accompanying a patient to their appointment will only be allowed in the ultrasound room at the discretion of the ultrasound technician under special circumstances. We apologize for any inconvenience. **To do in September 2026**   Follow-Up: At [facility], you and your health needs are our priority.  As part of our continuing mission to provide you with  exceptional heart care, our providers are all part of one team.  This team includes your primary Cardiologist (physician) and Advanced Practice Providers or APPs (Physician Assistants and Nurse Practitioners) who all work together to provide you with the care you need, when you need it.  Your next appointment:   12 month(s)  Provider:   Dorn Lesches, MD    We recommend signing up for the patient portal called MyChart.  Sign up information is provided on this After Visit Summary.  MyChart is used to connect with patients for Virtual Visits (Telemedicine).  Patients are able to view lab/test results, encounter notes, upcoming appointments, etc.  Non-urgent messages can be sent to your provider as well.   To learn more about what you can do with MyChart, go to [email or web address].   Other Instructions

## 2024-05-21 NOTE — Assessment & Plan Note (Signed)
 History of pulmonary embolism in the past found to have antiphospholipid antibody on chronic Xarelto oral anticoagulation.

## 2024-05-21 NOTE — Progress Notes (Signed)
 05/21/2024 [name]   05-02-1946  [id number]  Primary Physician Andrew Nottingham, MD Primary Cardiologist: Andrew JINNY Lesches MD [name], [place]  HPI:  [name] is a 78 y.o.  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 I last saw him 05/14/2021.SABRA He has a history of remote tobacco abuse, having smoked cigars in the past, treated hypertension, dyslipidemia. He has never had a heart attack or stroke. He has had bladder and prostate cancer, and he has had prostate surgery. He is followed by Dr. Signe Ford and has been evaluated by Dr. Gerlene Ford in the past with a Myoview  stress test that was normal and an event monitor that showed PVCs for palpitations. He does drink 2-4 cups of coffee a day and has had palpitations since he was a teenager. He saw Dr. Clarice complaining of right-sided chest pain and shortness of breath. He had a CT scan done April 19, 2012 that showed 2 small subsegmental right-sided pulmonary emboli of unclear origin. There is no evidence of DVT. He was placed on Xarelto. Dr. Graft did a hypercoagulable workup which apparently, according to the patient, was positive for lupus anticoagulant. Since I saw him he has remained asymptomatic. We have been following his small thoracic aortic aneurysm which a year ago measured 4.7 cm     He had a chest CTA performed 06/05/2019 revealed his thoracic aortic aneurysm measured 4.5 cm which has been stable.  Dr. Lucas follows this.  He additionally had a 2D echo performed 05/01/2020 that showed mild to moderate aortic insufficiency, normal LV function with grade 1 diastolic dysfunction.   Dr. Lucas follows his thoracic aortic aneurysm which was recently measured by CTA 06/03/2020 at 4.5 cm, stable.  2D echo performed 05/01/2020 showed normal LV systolic function with mild to moderate AI,  unchanged.  His PCP, Dr. Clarice, recently performed a coronary calcium  score 12/02/2020 which measured 2079.  This was equally distributed in all 3 coronary arteries.  He is completely asymptomatic.  He exercises 6 days a week riding his bicycle, walks up flights of stairs without symptoms.  His last Myoview  performed 06/30/2011 was negative.  Since I saw him in the office 3 years ago he is remained stable.  He denies chest pain or shortness of breath.  Dr. Sherrine has been following his ascending thoracic aortic aneurysm which has measured 4.5 cm by CTA 06/05/2023 confirmed by 2D echo as well 05/01/2024.  He is still very active and exercises on a regular basis.   Current Meds  Medication Sig   ALPRAZolam (XANAX) 1 MG tablet Take 1 tablet by mouth 2 (two) times daily.   anastrozole (ARIMIDEX) 1 MG tablet    co-enzyme Q-10 30 MG capsule Take 30 mg by mouth every evening.   Cyanocobalamin 1000 MCG/ML KIT Inject 1 mL as directed once a week.   fluticasone  (FLONASE ) 50 MCG/ACT nasal spray USE 2 SPRAYS IN EACH NOSTRIL EVERY NIGHT   Glutathione 200 MG/ML SOLN Inject 1 mL into the muscle once a week.   hydrochlorothiazide (HYDRODIURIL) 25 MG tablet Take 25 mg by mouth daily.   hydrocortisone  2.5 % cream Apply 1 application topically 2 (two) times daily as needed.   Hypertonic Nasal Wash (SINUS RINSE BOTTLE KIT) PACK Place into the nose at bedtime.   levocetirizine (XYZAL  ALLERGY 24HR) 5 MG tablet Take 1 tablet (5 mg total)  by mouth every evening.   Lidocaine -Hydrocortisone  Ace 3-2.5 % KIT Place 1 application rectally daily as needed (hemorrhoids).   metoprolol  tartrate (LOPRESSOR ) 100 MG tablet Take 1 tablet (100 mg total) by mouth 2 (two) times daily.   Multiple Vitamin (MULTIVITAMIN) tablet Take 1 tablet by mouth daily.   oxymetazoline (AFRIN) 0.05 % nasal spray Place 2 sprays into the nose 2 (two) times daily.   ramipril  (ALTACE ) 10 MG capsule Take 1 capsule (10 mg total) by mouth 2 (two) times daily.    rivaroxaban (XARELTO) 20 MG TABS tablet Take 20 mg by mouth daily.   rosuvastatin  (CRESTOR ) 20 MG tablet Take 10 mg by mouth daily.   sertraline (ZOLOFT) 100 MG tablet Take 100 mg by mouth daily.   sildenafil (VIAGRA) 100 MG tablet Take 100 mg by mouth as needed.   testosterone enanthate (DELATESTRYL) 200 MG/ML injection Inject into the muscle once a week. For IM use only   traMADol (ULTRAM) 50 MG tablet Take 1-2 tablets by mouth as needed.   VITAMIN D, ERGOCALCIFEROL, PO Take 5,000 Units by mouth daily.   Wheat Dextrin (BENEFIBER PO) Take 1 Dose by mouth 2 (two) times daily.     Allergies  Allergen Reactions   Aspirin Other (See Comments)    REACTION: gi upset with higher dose   Penicillins Itching    Social History   Socioeconomic History   Marital status: Married    Spouse name: [name]   Number of children: 2   Years of education: Not on file   Highest education level: Not on file  Occupational History   Occupation: Acupuncturist   Occupation: [employer]    Employer: [employer]  Tobacco Use   Smoking status: Some Days    Types: Cigars   Smokeless tobacco: Never   Tobacco comments:    5 cigars /week--/  smoked cigarettes for 10 years quit 1990's  Vaping Use   Vaping status: Never Used  Substance and Sexual Activity   Alcohol use: Yes    Alcohol/week: 28.0 standard drinks of alcohol    Types: 14 Glasses of wine, 14 Cans of beer per week    Comment: 3 drinks a week   Drug use: No    Comment: not since the 60's   Sexual activity: Not on file  Other Topics Concern   Not on file  Social History Narrative   Married to [place])   Lives with wife   Gets regular exercise.   Daily caffeine- 2 cups daily.   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, mental health      Social Drivers of Corporate investment banker Strain: Low  Risk  (02/21/2024)   Received from [facility]   Overall Financial Resource Strain (CARDIA)    Difficulty of Paying Living Expenses: Not very hard  Food Insecurity: No Food Insecurity (02/21/2024)   [facility]   Hunger Vital Sign    Within the past 12 months, you worried that your food would run out before you got the money to buy more.: Never true    Within the past 12 months, the food you bought just didn't last and you didn't have money to get more.: Never true  Transportation Needs: No Transportation Needs (02/21/2024)   Received from [facility] - Transportation  Lack of Transportation (Medical): No    Lack of Transportation (Non-Medical): No  Physical Activity: Insufficiently Active (02/21/2024)   [facility] from [facility]   Exercise Vital Sign    On average, how many days per week do you engage in moderate to strenuous exercise (like a brisk walk)?: 4 days    On average, how many minutes do you engage in exercise at this level?: 30 min  Stress: Stress Concern Present (02/21/2024)   Received from [facility] - Occupational Stress Questionnaire    Feeling of Stress : To some extent  Social Connections: Moderately Integrated (02/21/2024)   Received from [facility]   Social Network    How would you rate your social network (family, work, friends)?: Adequate participation with social networks  Intimate Partner Violence: Not At Risk (02/21/2024)   Received from [facility]   HITS    Over the last 12 months how often did your partner physically hurt you?: Never    Over the last 12 months how often did your partner insult you or talk down to you?: Never    Over the last 12 months how often did your partner threaten you with physical harm?: Never    Over the last 12 months how often did your partner scream or curse at you?: Never     Review of Systems: General: negative for chills, fever, night sweats or weight  changes.  Cardiovascular: negative for chest pain, dyspnea on exertion, edema, orthopnea, palpitations, paroxysmal nocturnal dyspnea or shortness of breath Dermatological: negative for rash Respiratory: negative for cough or wheezing Urologic: negative for hematuria Abdominal: negative for nausea, vomiting, diarrhea, bright red blood per rectum, melena, or hematemesis Neurologic: negative for visual changes, syncope, or dizziness All other systems reviewed and are otherwise negative except as noted above.    Blood pressure (!) 144/74, pulse 70, height 5' 11 (1.803 m), weight 234 lb (106.1 kg), SpO2 95%.  General appearance: alert and no distress Neck: no adenopathy, no carotid bruit, no JVD, supple, symmetrical, trachea midline, and thyroid  not enlarged, symmetric, no tenderness/mass/nodules Lungs: clear to auscultation bilaterally Heart: regular rate and rhythm, S1, S2 normal, no murmur, click, rub or gallop Extremities: extremities normal, atraumatic, no cyanosis or edema Pulses: 2+ and symmetric Skin: Skin color, texture, turgor normal. No rashes or lesions Neurologic: Grossly normal  EKG EKG Interpretation Date/Time:  Tuesday May 21 2024 15:33:22 EDT Ventricular Rate:  70 PR Interval:  186 QRS Duration:  96 QT Interval:  384 QTC Calculation: 414 R Axis:   -29  Text Interpretation: Normal sinus rhythm Minimal voltage criteria for LVH, may be normal variant ( R in aVL ) When compared with ECG of 15-May-2023 14:41, No significant change was found Confirmed by Court Carrier [phone]) on 05/21/2024 3:40:51 PM    ASSESSMENT AND PLAN:   HYPERLIPIDEMIA History of hyperlipidemia on rosuvastatin  20 mg a day which he cuts in half.  His most recent lipid profile performed 11/10/2023 revealed total cholesterol 140, LDL 80 and HDL 39, not at goal for secondary prevention given his extremely elevated coronary calcium  score.  Of asked him to go back to 20 mg of rosuvastatin  and we will  recheck a lipid liver profile in 3 months.  LDL goal less than 70  Essential hypertension History of essential hypertension blood pressure measured today at 144/74.  He says usually at home is somewhat lower than this.  He is on hydrochlorothiazide metoprolol  and ramipril .  Pulmonary embolus (HCC) History of pulmonary embolism in the past found to have antiphospholipid antibody on chronic Xarelto oral anticoagulation.  Thoracic aortic aneurysm History of thoracic aortic aneurysm followed by Dr. Sherrine with chest CTA performed 06/05/2023 revealing dimensions that were unchanged at 45 mm.  This is consistent with his 2D echo as well.  Elevated coronary artery calcium  score Elevated coronary calcium  score of 2079 done 06/03/2020.  He is asymptomatic and at not quite at goal for secondary prevention.     Andrew DOROTHA Lesches MD FACP,FACC,FAHA, [facility] 05/21/2024 3:49 PM

## 2024-05-21 NOTE — Addendum Note (Signed)
 Addended by: LORRENE FEDERICO CROME on: 05/21/2024 03:52 PM   Modules accepted: Orders

## 2024-05-21 NOTE — Assessment & Plan Note (Signed)
 History of thoracic aortic aneurysm followed by Dr. Sherrine with chest CTA performed 06/05/2023 revealing dimensions that were unchanged at 45 mm.  This is consistent with his 2D echo as well.

## 2024-05-21 NOTE — Assessment & Plan Note (Signed)
 History of hyperlipidemia on rosuvastatin  20 mg a day which he cuts in half.  His most recent lipid profile performed 11/10/2023 revealed total cholesterol 140, LDL 80 and HDL 39, not at goal for secondary prevention given his extremely elevated coronary calcium  score.  Of asked him to go back to 20 mg of rosuvastatin  and we will recheck a lipid liver profile in 3 months.  LDL goal less than 70

## 2024-05-21 NOTE — Assessment & Plan Note (Signed)
 Elevated coronary calcium  score of 2079 done 06/03/2020.  He is asymptomatic and at not quite at goal for secondary prevention.

## 2024-05-21 NOTE — Assessment & Plan Note (Signed)
 History of essential hypertension blood pressure measured today at 144/74.  He says usually at home is somewhat lower than this.  He is on hydrochlorothiazide metoprolol  and ramipril .

## 2024-05-28 ENCOUNTER — Other Ambulatory Visit (INDEPENDENT_AMBULATORY_CARE_PROVIDER_SITE_OTHER): Payer: Self-pay | Admitting: Otolaryngology

## 2024-05-30 DIAGNOSIS — K760 Fatty (change of) liver, not elsewhere classified: Secondary | ICD-10-CM | POA: Diagnosis not present

## 2024-07-30 ENCOUNTER — Ambulatory Visit: Admitting: Internal Medicine

## 2024-07-30 ENCOUNTER — Other Ambulatory Visit: Payer: Self-pay

## 2024-07-30 ENCOUNTER — Other Ambulatory Visit: Payer: Medicare Other

## 2024-07-30 ENCOUNTER — Inpatient Hospital Stay: Attending: Internal Medicine

## 2024-07-30 ENCOUNTER — Ambulatory Visit: Payer: Medicare Other | Admitting: Internal Medicine

## 2024-07-30 VITALS — BP 133/75 | HR 73 | Temp 97.8°F | Resp 17 | Ht 71.0 in | Wt 233.0 lb

## 2024-07-30 DIAGNOSIS — M791 Myalgia, unspecified site: Secondary | ICD-10-CM | POA: Diagnosis not present

## 2024-07-30 DIAGNOSIS — D509 Iron deficiency anemia, unspecified: Secondary | ICD-10-CM

## 2024-07-30 DIAGNOSIS — Z8546 Personal history of malignant neoplasm of prostate: Secondary | ICD-10-CM | POA: Insufficient documentation

## 2024-07-30 DIAGNOSIS — E611 Iron deficiency: Secondary | ICD-10-CM | POA: Diagnosis not present

## 2024-07-30 DIAGNOSIS — D6862 Lupus anticoagulant syndrome: Secondary | ICD-10-CM | POA: Diagnosis not present

## 2024-07-30 DIAGNOSIS — D508 Other iron deficiency anemias: Secondary | ICD-10-CM

## 2024-07-30 DIAGNOSIS — Z9079 Acquired absence of other genital organ(s): Secondary | ICD-10-CM | POA: Diagnosis not present

## 2024-07-30 DIAGNOSIS — Z7901 Long term (current) use of anticoagulants: Secondary | ICD-10-CM | POA: Insufficient documentation

## 2024-07-30 DIAGNOSIS — M545 Low back pain, unspecified: Secondary | ICD-10-CM | POA: Insufficient documentation

## 2024-07-30 DIAGNOSIS — G8929 Other chronic pain: Secondary | ICD-10-CM | POA: Insufficient documentation

## 2024-07-30 DIAGNOSIS — D751 Secondary polycythemia: Secondary | ICD-10-CM | POA: Insufficient documentation

## 2024-07-30 LAB — CBC WITH DIFFERENTIAL (CANCER CENTER ONLY)
Abs Immature Granulocytes: 0.02 K/uL (ref 0.00–0.07)
Basophils Absolute: 0 K/uL (ref 0.0–0.1)
Basophils Relative: 0 %
Eosinophils Absolute: 0.2 K/uL (ref 0.0–0.5)
Eosinophils Relative: 3 %
HCT: 50.9 % (ref 39.0–52.0)
Hemoglobin: 16.9 g/dL (ref 13.0–17.0)
Immature Granulocytes: 0 %
Lymphocytes Relative: 33 %
Lymphs Abs: 2.5 K/uL (ref 0.7–4.0)
MCH: 28.6 pg (ref 26.0–34.0)
MCHC: 33.2 g/dL (ref 30.0–36.0)
MCV: 86.1 fL (ref 80.0–100.0)
Monocytes Absolute: 0.6 K/uL (ref 0.1–1.0)
Monocytes Relative: 8 %
Neutro Abs: 4.3 K/uL (ref 1.7–7.7)
Neutrophils Relative %: 56 %
Platelet Count: 166 K/uL (ref 150–400)
RBC: 5.91 MIL/uL — ABNORMAL HIGH (ref 4.22–5.81)
RDW: 14.6 % (ref 11.5–15.5)
WBC Count: 7.6 K/uL (ref 4.0–10.5)
nRBC: 0 % (ref 0.0–0.2)

## 2024-07-30 LAB — CMP (CANCER CENTER ONLY)
ALT: 41 U/L (ref 0–44)
AST: 36 U/L (ref 15–41)
Albumin: 4.6 g/dL (ref 3.5–5.0)
Alkaline Phosphatase: 68 U/L (ref 38–126)
Anion gap: 7 (ref 5–15)
BUN: 15 mg/dL (ref 8–23)
CO2: 34 mmol/L — ABNORMAL HIGH (ref 22–32)
Calcium: 9.5 mg/dL (ref 8.9–10.3)
Chloride: 99 mmol/L (ref 98–111)
Creatinine: 1.32 mg/dL — ABNORMAL HIGH (ref 0.61–1.24)
GFR, Estimated: 56 mL/min — ABNORMAL LOW (ref >60)
Glucose, Bld: 93 mg/dL (ref 70–99)
Potassium: 4 mmol/L (ref 3.5–5.1)
Sodium: 139 mmol/L (ref 135–145)
Total Bilirubin: 1 mg/dL (ref 0.0–1.2)
Total Protein: 7.3 g/dL (ref 6.5–8.1)

## 2024-07-30 LAB — IRON AND IRON BINDING CAPACITY (CC-WL,HP ONLY)
Iron: 56 ug/dL (ref 45–182)
Saturation Ratios: 12 % — ABNORMAL LOW (ref 17.9–39.5)
TIBC: 451 ug/dL — ABNORMAL HIGH (ref 250–450)
UIBC: 395 ug/dL

## 2024-07-30 LAB — FERRITIN: Ferritin: 27 ng/mL (ref 24–336)

## 2024-07-30 NOTE — Progress Notes (Signed)
 [facility] Telephone:([phone]) [phone]   [phone]  OFFICE PROGRESS NOTE  Clarice Nottingham, MD [address] [place] [address]  DIAGNOSIS:  1) Prostate adenocarcinoma diagnosed in 2010 with Gleason score of 6 without any evidence of disease recurrence. 2) iron  deficiency with microcytosis but no anemia  PRIOR THERAPY: Status post prostatectomy on October 20, 2008.  CURRENT THERAPY: Observation  INTERVAL HISTORY: [name] 78 y.o. male returns to the clinic today for annual follow-up visit. Discussed the use of AI scribe software for clinical note transcription with the patient, who gave verbal consent to proceed.  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  He has chronic iron  deficiency anemia, with a recent history of donating three pints and noting subjective improvement after donation. He expresses concern regarding the impact of blood donation on his iron  levels and inquires about the relationship between donation and iron  deficiency. Hemoglobin is currently 16.9 g/dL; iron  studies and thalassemia testing are pending.  He is receiving testosterone therapy for the past three years. He recently reduced his testosterone dose by half.  He experiences chronic, intermittent generalized myalgias described as flu-like, and low back pain for which he wears a belt for support. He does not engage in strenuous activity or exercise that would precipitate these symptoms.  He was diagnosed with prostate adenocarcinoma (Gleason 6) in 2010 and underwent prostatectomy in March of that year. He remains in remission with no evidence of disease recurrence and is currently in an observation period. He has no new urinary or constitutional symptoms and is able to ambulate without  difficulty, including walking from the parking lot to the clinic. No chest pain or dyspnea.    MEDICAL HISTORY: Past Medical History:  Diagnosis Date   Antiphospholipid antibody syndrome 05/22/2012   Found subsequent to recurrent PE 9/13   Arthritis    Bladder cancer (HCC) dx'd 2009   surg only   Chronic anal fissure    w/ recurrence's   Chronic anxiety    Depression    ED (erectile dysfunction)    Esophageal candidiasis (HCC) 03/14/2017   Fatty liver    GERD (gastroesophageal reflux disease)    Heart murmur    Hepatic steatosis    Hiatal hernia    History of adenomatous polyp of colon    2005   History of bladder cancer urologist-  dr alline   papillary TCC  s/p  turbt   History of DVT (deep vein thrombosis)    post prostate surgery 2010  right LLE   History of esophageal stricture    w/ dilation   History of gastric polyp    benign 2010   History of prostate cancer urologist- dr grapey/  oncology-  dr amadeo   Stage  T1c , Gleason 3+3;   s/p  prostatectomy 03-08-210//  per pt currently PSA nondetectable    History of pulmonary embolus (PE)    post prostate surgery 2010  and 2013 x2 right side per CT scan (found to have lupus anticoaglated )   History of TIA (transient ischemic attack)    07/ 2012 noted on scans remote probable tia's in  age 70's   History of TMJ syndrome    HTN (hypertension)    Hyperlipidemia    IDA (iron  deficiency anemia)    getting  iron  IV   Lupus anticoagulant positive    found 2013 when pt had PE   Moderate aortic regurgitation    with no  stenosis    OSA on CPAP    per study severe osa 12-14-2010   Prolapsed internal hemorrhoids, grade 2 03/31/2014   prostate ca dx'd 2010   surg only   RLS (restless legs syndrome)    Sleep apnea    Symptomatic PVCs cardiologist-  dr berry   intermittant   Wears glasses     ALLERGIES:  is allergic to aspirin and penicillins.  MEDICATIONS:  Current Outpatient Medications  Medication Sig Dispense  Refill   ALPRAZolam (XANAX) 1 MG tablet Take 1 tablet by mouth 2 (two) times daily.     anastrozole (ARIMIDEX) 1 MG tablet      co-enzyme Q-10 30 MG capsule Take 30 mg by mouth every evening.     Cyanocobalamin 1000 MCG/ML KIT Inject 1 mL as directed once a week.     fluticasone  (FLONASE ) 50 MCG/ACT nasal spray PLACE 2 SPRAYS INTO BOTH NOSTRILS 2 (TWO) TIMES DAILY 96 mL 1   Glutathione 200 MG/ML SOLN Inject 1 mL into the muscle once a week.     hydrochlorothiazide (HYDRODIURIL) 25 MG tablet Take 25 mg by mouth daily.     hydrocortisone  2.5 % cream Apply 1 application topically 2 (two) times daily as needed.     Hypertonic Nasal Wash (SINUS RINSE BOTTLE KIT) PACK Place into the nose at bedtime.     levocetirizine (XYZAL  ALLERGY 24HR) 5 MG tablet Take 1 tablet (5 mg total) by mouth every evening. 30 tablet 3   Lidocaine -Hydrocortisone  Ace 3-2.5 % KIT Place 1 application rectally daily as needed (hemorrhoids). 1 kit 3   metoprolol  tartrate (LOPRESSOR ) 100 MG tablet Take 1 tablet (100 mg total) by mouth 2 (two) times daily. 180 tablet 4   Multiple Vitamin (MULTIVITAMIN) tablet Take 1 tablet by mouth daily.     oxymetazoline (AFRIN) 0.05 % nasal spray Place 2 sprays into the nose 2 (two) times daily.     ramipril  (ALTACE ) 10 MG capsule Take 1 capsule (10 mg total) by mouth 2 (two) times daily. 180 capsule 4   rivaroxaban (XARELTO) 20 MG TABS tablet Take 20 mg by mouth daily.     rosuvastatin  (CRESTOR ) 20 MG tablet Take 1 tablet (20 mg total) by mouth daily. 90 tablet 3   sertraline (ZOLOFT) 100 MG tablet Take 100 mg by mouth daily.     sildenafil (VIAGRA) 100 MG tablet Take 100 mg by mouth as needed.     testosterone enanthate (DELATESTRYL) 200 MG/ML injection Inject into the muscle once a week. For IM use only     traMADol (ULTRAM) 50 MG tablet Take 1-2 tablets by mouth as needed.     VITAMIN D, ERGOCALCIFEROL, PO Take 5,000 Units by mouth daily.     Wheat Dextrin (BENEFIBER PO) Take 1 Dose by  mouth 2 (two) times daily.     No current facility-administered medications for this visit.    SURGICAL HISTORY:  Past Surgical History:  Procedure Laterality Date   APPENDECTOMY  age 62   and Left Inguinal Hernia Repair   CARDIOVASCULAR STRESS TEST  06-30-2011   dr berry   normal nuclear study/  normal LV function and wall motion, ef 56%   COLONOSCOPY  last one 10-19-2011   CYSTO/ BLADDER BX/  RETROGRADE PYELOGRAM/  TRANSRECTAL PROSTATE BX'S  07-30-2008   ESOPHAGOGASTRODUODENOSCOPY  last  one 06-27-2011   EVALUATION UNDER ANESTHESIA WITH FISTULECTOMY N/A 01/14/2016   Procedure: EXAM UNDER ANESTHESIA WITH REPAIR OF PERIRECTAL FISTULA  (LIFT);  Surgeon: Elspeth Schultze, MD;  Location: [facility];  Service: General;  Laterality: N/A;   LAPAROSCOPIC CHOLECYSTECTOMY  05-25-2007   and Excision cyst back of neck   ROBOT ASSISTED LAPAROSCOPIC RADICAL PROSTATECTOMY  10-20-2008   STRABISMUS SURGERY Bilateral age 34   TONSILLECTOMY  age 89   TRANSTHORACIC ECHOCARDIOGRAM  03-23-2015   dr berry   grade 1 diastolic dysfunction, ef 55-60%/  mild AV thickened leaflets with no stenosis,  moderate AR/  mild ascending aorta ilatation3.9cm/  trivial MR and TR   UPPER GASTROINTESTINAL ENDOSCOPY      REVIEW OF SYSTEMS:  A comprehensive review of systems was negative except for: Constitutional: positive for fatigue Musculoskeletal: positive for arthralgias   PHYSICAL EXAMINATION: General appearance: alert, cooperative, and no distress Head: Normocephalic, without obvious abnormality, atraumatic Neck: no adenopathy, no JVD, supple, symmetrical, trachea midline, and thyroid  not enlarged, symmetric, no tenderness/mass/nodules Lymph nodes: Cervical, supraclavicular, and axillary nodes normal. Resp: clear to auscultation bilaterally Back: symmetric, no curvature. ROM normal. No CVA tenderness. Cardio: regular rate and rhythm, S1, S2 normal, no murmur, click, rub or gallop GI: soft, non-tender;  bowel sounds normal; no masses,  no organomegaly Extremities: extremities normal, atraumatic, no cyanosis or edema  ECOG PERFORMANCE STATUS: 1 - Symptomatic but completely ambulatory  Blood pressure 133/75, pulse 73, temperature 97.8 F (36.6 C), temperature source Temporal, resp. rate 17, height 5' 11 (1.803 m), weight 233 lb (105.7 kg), SpO2 98%.  LABORATORY DATA: Lab Results  Component Value Date   WBC 7.6 07/30/2024   HGB 16.9 07/30/2024   HCT 50.9 07/30/2024   MCV 86.1 07/30/2024   PLT 166 07/30/2024      Chemistry      Component Value Date/Time   NA 139 10/26/2023 0916   NA 142 04/18/2018 1324   NA 140 07/14/2017 0817   K 4.0 10/26/2023 0916   K 3.8 07/14/2017 0817   CL 100 10/26/2023 0916   CL 103 11/02/2012 0737   CO2 36 (H) 10/26/2023 0916   CO2 28 07/14/2017 0817   BUN 11 10/26/2023 0916   BUN 12 04/18/2018 1324   BUN 16.8 07/14/2017 0817   CREATININE 1.27 (H) 10/26/2023 0916   CREATININE 1.1 07/14/2017 0817      Component Value Date/Time   CALCIUM  9.4 10/26/2023 0916   CALCIUM  9.4 07/14/2017 0817   ALKPHOS 58 10/26/2023 0916   ALKPHOS 67 07/14/2017 0817   AST 45 (H) 10/26/2023 0916   AST 35 (H) 07/14/2017 0817   ALT 69 (H) 10/26/2023 0916   ALT 57 (H) 07/14/2017 0817   BILITOT 1.2 10/26/2023 0916   BILITOT 0.60 07/14/2017 0817       RADIOGRAPHIC STUDIES: No results found.  ASSESSMENT AND PLAN: This is a very pleasant 78 years old white male with Prostate adenocarcinoma diagnosed in 2010 with Gleason score of 6 without any evidence of disease recurrence.  He is status post prostatectomy on October 20, 2008. Assessment and Plan Assessment & Plan Iron  deficiency Chronic iron  deficiency likely secondary to frequent blood donation. Hemoglobin remains within normal limits. Iron  studies and thalassemia testing are pending. He reports no acute symptoms. The contribution of blood donation to iron  deficiency and the balance between iron  supplementation and  elevated hemoglobin were reviewed. - Ordered repeat iron  studies and thalassemia testing. - Will notify him of  any concerning laboratory findings.  Prostate adenocarcinoma post-prostatectomy, in remission Prostate adenocarcinoma, status post-prostatectomy in 2010 (Gleason 6), in remission with no evidence of recurrence. He remains under observation. - Continue observation. - Scheduled follow-up in six months.  Secondary polycythemia due to testosterone therapy Secondary polycythemia attributed to ongoing testosterone therapy. He has reduced the testosterone dose by half, which may mitigate erythrocytosis. The association between testosterone therapy, erythrocytosis, and potential risks was discussed. - Monitor hemoglobin and hematocrit. - Encouraged continuation of reduced testosterone dose. - Will reassess laboratory results and clinical status at next visit. The patient was advised to call immediately if he has any concerning symptoms in the interval.  The patient voices understanding of current disease status and treatment options and is in agreement with the current care plan.  All questions were answered. The patient knows to call the clinic with any problems, questions or concerns. We can certainly see the patient much sooner if necessary.  The total time spent in the appointment was 20 minutes.  Disclaimer: This note was dictated with voice recognition software. Similar sounding words can inadvertently be transcribed and may not be corrected upon review.

## 2024-07-31 LAB — PSA, TOTAL AND FREE
PSA, Free Pct: UNDETERMINED %
PSA, Free: <0.02 ng/mL
Prostate Specific Ag, Serum: <0.1 ng/mL (ref 0.0–4.0)

## 2024-08-01 ENCOUNTER — Encounter: Payer: Self-pay | Admitting: Internal Medicine

## 2024-08-01 LAB — HGB FRACTIONATION CASCADE
Hgb A2: 1.9 % (ref 1.8–3.2)
Hgb A: 98.1 % (ref 96.4–98.8)
Hgb F: 0 % (ref 0.0–2.0)
Hgb S: 0 %

## 2024-08-05 ENCOUNTER — Telehealth: Payer: Self-pay

## 2024-08-05 ENCOUNTER — Other Ambulatory Visit: Payer: Self-pay | Admitting: Internal Medicine

## 2024-08-05 ENCOUNTER — Other Ambulatory Visit: Payer: Self-pay

## 2024-08-05 NOTE — Telephone Encounter (Addendum)
 Dr. Sherrod, patient will be scheduled as soon as possible.  Auth Submission: NO AUTH NEEDED Site of care: Site of care: [facility] Payer: [employer] medicare Medication & CPT/J Code(s) submitted: Venofer  (Iron  Sucrose) J1756 Diagnosis Code:  Route of submission (phone, fax, portal):  Phone # Fax # Auth type: Buy/Bill PB Units/visits requested: 300mg  x 2 doses Reference number:  Approval from: 08/05/24 to 12/12/24

## 2024-08-05 NOTE — Addendum Note (Signed)
 Addended by: DAYNE SHERRY RAMAN on: 08/05/2024 02:43 PM   Modules accepted: Orders

## 2024-08-06 ENCOUNTER — Other Ambulatory Visit: Payer: Self-pay | Admitting: Medical Oncology

## 2024-08-07 LAB — ALPHA-THALASSEMIA ANALYSIS: IMAGE: 0

## 2024-08-19 ENCOUNTER — Ambulatory Visit

## 2024-08-19 VITALS — BP 150/76 | HR 61 | Temp 98.2°F | Resp 18 | Ht 71.0 in | Wt 236.2 lb

## 2024-08-19 DIAGNOSIS — D508 Other iron deficiency anemias: Secondary | ICD-10-CM

## 2024-08-19 MED ORDER — IRON SUCROSE 300 MG IVPB - SIMPLE MED
300.0000 mg | Status: DC
  Administered 2024-08-19: 300 mg via INTRAVENOUS
  Filled 2024-08-19: qty 265

## 2024-08-19 NOTE — Progress Notes (Signed)
 Diagnosis:  Iron  Deficiency Anemia  Provider:  Praveen Mannam MD  Procedure: IV Infusion  IV Type: Peripheral, IV Location: L Antecubital   Venofer  (Iron  Sucrose), Dose: 300 mg  Infusion Start Time: 1341  Infusion Stop Time: 1515  Post Infusion IV Care: Observation period completed and Peripheral IV Discontinued  Discharge: Condition: Good, Destination: Home . AVS Declined  Performed by:  Maximiano JONELLE Pouch, LPN

## 2024-08-22 ENCOUNTER — Other Ambulatory Visit: Payer: Self-pay | Admitting: Pulmonary Disease

## 2024-08-23 NOTE — Telephone Encounter (Signed)
 Last OV 02/26/2024.  Per OV note:  Now on Afrin 1 spray in each nostril and Flonase  1 spray in each nostril   Continue current nasal sprays

## 2024-08-26 ENCOUNTER — Ambulatory Visit

## 2024-08-26 VITALS — BP 144/67 | HR 67 | Temp 97.8°F | Resp 16 | Ht 71.0 in | Wt 235.6 lb

## 2024-08-26 DIAGNOSIS — D508 Other iron deficiency anemias: Secondary | ICD-10-CM | POA: Diagnosis not present

## 2024-08-26 MED ORDER — IRON SUCROSE 300 MG IVPB - SIMPLE MED
300.0000 mg | Status: DC
  Administered 2024-08-26: 300 mg via INTRAVENOUS
  Filled 2024-08-26: qty 265

## 2024-08-26 NOTE — Progress Notes (Signed)
 Diagnosis: Iron  Deficiency Anemia  Provider:  Mannam, Praveen MD  Procedure: IV Infusion  IV Type: Peripheral, IV Location: R Antecubital  Venofer  (Iron  Sucrose), Dose: 300 mg  Infusion Start Time: 1336  Infusion Stop Time: 1515  Post Infusion IV Care: Observation period completed and Peripheral IV Discontinued  Discharge: Condition: Good, Destination: Home . AVS Declined  Performed by:  Rocky FORBES Search, RN

## 2024-11-20 ENCOUNTER — Ambulatory Visit: Admitting: Pulmonary Disease

## 2025-01-28 ENCOUNTER — Inpatient Hospital Stay

## 2025-01-28 ENCOUNTER — Inpatient Hospital Stay: Admitting: Internal Medicine

## 2025-03-13 ENCOUNTER — Ambulatory Visit: Admitting: Neurology

## 2025-04-15 ENCOUNTER — Other Ambulatory Visit (HOSPITAL_COMMUNITY)
